# Patient Record
Sex: Female | Born: 1945
Health system: Southern US, Community
[De-identification: ages and names within clinical notes are randomized; demographics above are authoritative.]

## PROBLEM LIST (undated history)

## (undated) DIAGNOSIS — Z9889 Other specified postprocedural states: Secondary | ICD-10-CM

## (undated) DIAGNOSIS — Z8719 Personal history of other diseases of the digestive system: Secondary | ICD-10-CM

## (undated) DIAGNOSIS — E119 Type 2 diabetes mellitus without complications: Secondary | ICD-10-CM

## (undated) DIAGNOSIS — G250 Essential tremor: Secondary | ICD-10-CM

## (undated) DIAGNOSIS — I10 Essential (primary) hypertension: Secondary | ICD-10-CM

## (undated) DIAGNOSIS — F329 Major depressive disorder, single episode, unspecified: Secondary | ICD-10-CM

## (undated) DIAGNOSIS — M159 Polyosteoarthritis, unspecified: Secondary | ICD-10-CM

## (undated) DIAGNOSIS — F32A Depression, unspecified: Secondary | ICD-10-CM

## (undated) DIAGNOSIS — K760 Fatty (change of) liver, not elsewhere classified: Secondary | ICD-10-CM

## (undated) DIAGNOSIS — C439 Malignant melanoma of skin, unspecified: Secondary | ICD-10-CM

## (undated) DIAGNOSIS — I7300 Raynaud's syndrome without gangrene: Secondary | ICD-10-CM

## (undated) DIAGNOSIS — R112 Nausea with vomiting, unspecified: Secondary | ICD-10-CM

## (undated) DIAGNOSIS — J45909 Unspecified asthma, uncomplicated: Secondary | ICD-10-CM

## (undated) DIAGNOSIS — F410 Panic disorder [episodic paroxysmal anxiety] without agoraphobia: Secondary | ICD-10-CM

## (undated) DIAGNOSIS — Q8901 Asplenia (congenital): Secondary | ICD-10-CM

## (undated) DIAGNOSIS — G4733 Obstructive sleep apnea (adult) (pediatric): Secondary | ICD-10-CM

## (undated) DIAGNOSIS — J449 Chronic obstructive pulmonary disease, unspecified: Secondary | ICD-10-CM

## (undated) DIAGNOSIS — R0600 Dyspnea, unspecified: Secondary | ICD-10-CM

## (undated) DIAGNOSIS — Z972 Presence of dental prosthetic device (complete) (partial): Secondary | ICD-10-CM

## (undated) DIAGNOSIS — R52 Pain, unspecified: Secondary | ICD-10-CM

## (undated) DIAGNOSIS — J9611 Chronic respiratory failure with hypoxia: Secondary | ICD-10-CM

## (undated) DIAGNOSIS — G2581 Restless legs syndrome: Secondary | ICD-10-CM

## (undated) DIAGNOSIS — E039 Hypothyroidism, unspecified: Secondary | ICD-10-CM

## (undated) DIAGNOSIS — I499 Cardiac arrhythmia, unspecified: Secondary | ICD-10-CM

## (undated) DIAGNOSIS — M797 Fibromyalgia: Secondary | ICD-10-CM

## (undated) HISTORY — DX: Obstructive sleep apnea (adult) (pediatric): G47.33

## (undated) HISTORY — PX: CHOLECYSTECTOMY: SHX55

## (undated) HISTORY — DX: Essential tremor: G25.0

## (undated) HISTORY — DX: Malignant melanoma of skin, unspecified: C43.9

## (undated) HISTORY — DX: Polyosteoarthritis, unspecified: M15.9

## (undated) HISTORY — DX: Restless legs syndrome: G25.81

## (undated) HISTORY — PX: TOTAL KNEE ARTHROPLASTY: SHX125

## (undated) HISTORY — PX: JOINT REPLACEMENT: SHX530

## (undated) HISTORY — DX: Raynaud's syndrome without gangrene: I73.00

## (undated) HISTORY — DX: Essential (primary) hypertension: I10

---

## 1970-12-22 HISTORY — PX: RHINOPLASTY: SUR1284

## 1998-09-28 ENCOUNTER — Other Ambulatory Visit: Admission: RE | Admit: 1998-09-28 | Discharge: 1998-09-28 | Payer: Self-pay | Admitting: Gynecology

## 1999-12-23 DIAGNOSIS — E1140 Type 2 diabetes mellitus with diabetic neuropathy, unspecified: Secondary | ICD-10-CM | POA: Diagnosis present

## 2000-04-13 ENCOUNTER — Other Ambulatory Visit: Admission: RE | Admit: 2000-04-13 | Discharge: 2000-04-13 | Payer: Self-pay | Admitting: Gynecology

## 2000-05-11 ENCOUNTER — Encounter: Payer: Self-pay | Admitting: *Deleted

## 2000-05-11 ENCOUNTER — Encounter: Admission: RE | Admit: 2000-05-11 | Discharge: 2000-05-11 | Payer: Self-pay | Admitting: *Deleted

## 2000-10-16 ENCOUNTER — Encounter: Payer: Self-pay | Admitting: Internal Medicine

## 2000-10-16 ENCOUNTER — Encounter: Admission: RE | Admit: 2000-10-16 | Discharge: 2000-10-16 | Payer: Self-pay | Admitting: Internal Medicine

## 2001-02-01 ENCOUNTER — Other Ambulatory Visit: Admission: RE | Admit: 2001-02-01 | Discharge: 2001-02-01 | Payer: Self-pay | Admitting: Gynecology

## 2001-03-11 ENCOUNTER — Encounter (INDEPENDENT_AMBULATORY_CARE_PROVIDER_SITE_OTHER): Payer: Self-pay | Admitting: *Deleted

## 2001-03-11 ENCOUNTER — Other Ambulatory Visit: Admission: RE | Admit: 2001-03-11 | Discharge: 2001-03-11 | Payer: Self-pay | Admitting: Gynecology

## 2001-03-22 ENCOUNTER — Ambulatory Visit (HOSPITAL_COMMUNITY): Admission: RE | Admit: 2001-03-22 | Discharge: 2001-03-22 | Payer: Self-pay | Admitting: Gastroenterology

## 2001-06-28 ENCOUNTER — Ambulatory Visit (HOSPITAL_BASED_OUTPATIENT_CLINIC_OR_DEPARTMENT_OTHER): Admission: RE | Admit: 2001-06-28 | Discharge: 2001-06-28 | Payer: Self-pay | Admitting: Internal Medicine

## 2002-03-08 ENCOUNTER — Encounter: Admission: RE | Admit: 2002-03-08 | Discharge: 2002-03-08 | Payer: Self-pay | Admitting: Internal Medicine

## 2002-03-08 ENCOUNTER — Encounter: Payer: Self-pay | Admitting: Internal Medicine

## 2002-08-04 ENCOUNTER — Encounter: Payer: Self-pay | Admitting: Internal Medicine

## 2002-08-04 ENCOUNTER — Encounter: Admission: RE | Admit: 2002-08-04 | Discharge: 2002-08-04 | Payer: Self-pay | Admitting: Internal Medicine

## 2002-12-28 ENCOUNTER — Encounter: Admission: RE | Admit: 2002-12-28 | Discharge: 2002-12-28 | Payer: Self-pay | Admitting: Internal Medicine

## 2002-12-28 ENCOUNTER — Encounter: Payer: Self-pay | Admitting: Internal Medicine

## 2003-08-02 ENCOUNTER — Encounter: Payer: Self-pay | Admitting: Internal Medicine

## 2003-08-02 ENCOUNTER — Encounter: Admission: RE | Admit: 2003-08-02 | Discharge: 2003-08-02 | Payer: Self-pay | Admitting: Internal Medicine

## 2004-12-22 HISTORY — PX: THYROIDECTOMY: SHX17

## 2005-01-07 ENCOUNTER — Ambulatory Visit: Payer: Self-pay | Admitting: Internal Medicine

## 2005-02-13 ENCOUNTER — Ambulatory Visit: Payer: Self-pay | Admitting: Internal Medicine

## 2005-05-12 ENCOUNTER — Ambulatory Visit: Payer: Self-pay | Admitting: Endocrinology

## 2006-05-13 ENCOUNTER — Ambulatory Visit: Payer: Self-pay | Admitting: Internal Medicine

## 2006-05-20 ENCOUNTER — Ambulatory Visit: Payer: Self-pay | Admitting: Internal Medicine

## 2007-01-15 ENCOUNTER — Ambulatory Visit: Admission: RE | Admit: 2007-01-15 | Discharge: 2007-01-15 | Payer: Self-pay | Admitting: Orthopedic Surgery

## 2007-04-05 ENCOUNTER — Inpatient Hospital Stay (HOSPITAL_COMMUNITY): Admission: RE | Admit: 2007-04-05 | Discharge: 2007-04-09 | Payer: Self-pay | Admitting: Orthopedic Surgery

## 2007-06-22 ENCOUNTER — Ambulatory Visit: Payer: Self-pay | Admitting: Internal Medicine

## 2008-07-20 ENCOUNTER — Ambulatory Visit: Payer: Self-pay | Admitting: Internal Medicine

## 2009-07-24 ENCOUNTER — Ambulatory Visit: Payer: Self-pay | Admitting: Internal Medicine

## 2010-08-14 ENCOUNTER — Ambulatory Visit: Payer: Self-pay | Admitting: Internal Medicine

## 2010-10-14 ENCOUNTER — Ambulatory Visit: Payer: Self-pay | Admitting: Unknown Physician Specialty

## 2010-11-06 ENCOUNTER — Inpatient Hospital Stay (HOSPITAL_COMMUNITY)
Admission: RE | Admit: 2010-11-06 | Discharge: 2010-11-10 | Payer: Self-pay | Source: Home / Self Care | Admitting: Orthopedic Surgery

## 2010-11-07 ENCOUNTER — Encounter (INDEPENDENT_AMBULATORY_CARE_PROVIDER_SITE_OTHER): Payer: Self-pay | Admitting: Orthopedic Surgery

## 2010-11-07 ENCOUNTER — Ambulatory Visit: Payer: Self-pay | Admitting: Vascular Surgery

## 2011-03-04 LAB — PROTIME-INR
INR: 0.93 (ref 0.00–1.49)
INR: 1.05 (ref 0.00–1.49)
INR: 1.76 — ABNORMAL HIGH (ref 0.00–1.49)
INR: 1.95 — ABNORMAL HIGH (ref 0.00–1.49)
INR: 2.63 — ABNORMAL HIGH (ref 0.00–1.49)
Prothrombin Time: 12.7 seconds (ref 11.6–15.2)
Prothrombin Time: 13.9 seconds (ref 11.6–15.2)
Prothrombin Time: 20.7 seconds — ABNORMAL HIGH (ref 11.6–15.2)
Prothrombin Time: 22.4 seconds — ABNORMAL HIGH (ref 11.6–15.2)
Prothrombin Time: 28.2 seconds — ABNORMAL HIGH (ref 11.6–15.2)

## 2011-03-04 LAB — URINALYSIS, ROUTINE W REFLEX MICROSCOPIC
Bilirubin Urine: NEGATIVE
Bilirubin Urine: NEGATIVE
Glucose, UA: NEGATIVE mg/dL
Glucose, UA: NEGATIVE mg/dL
Hgb urine dipstick: NEGATIVE
Hgb urine dipstick: NEGATIVE
Ketones, ur: NEGATIVE mg/dL
Ketones, ur: NEGATIVE mg/dL
Nitrite: NEGATIVE
Nitrite: NEGATIVE
Protein, ur: NEGATIVE mg/dL
Protein, ur: NEGATIVE mg/dL
Specific Gravity, Urine: 1.006 (ref 1.005–1.030)
Specific Gravity, Urine: 1.014 (ref 1.005–1.030)
Urobilinogen, UA: 0.2 mg/dL (ref 0.0–1.0)
Urobilinogen, UA: 0.2 mg/dL (ref 0.0–1.0)
pH: 5.5 (ref 5.0–8.0)
pH: 6 (ref 5.0–8.0)

## 2011-03-04 LAB — DIFFERENTIAL
Basophils Absolute: 0.1 10*3/uL (ref 0.0–0.1)
Basophils Relative: 0 % (ref 0–1)
Eosinophils Absolute: 0 10*3/uL (ref 0.0–0.7)
Eosinophils Relative: 0 % (ref 0–5)
Lymphocytes Relative: 12 % (ref 12–46)
Lymphs Abs: 2.6 10*3/uL (ref 0.7–4.0)
Monocytes Absolute: 3.1 10*3/uL — ABNORMAL HIGH (ref 0.1–1.0)
Monocytes Relative: 14 % — ABNORMAL HIGH (ref 3–12)
Neutro Abs: 15.7 10*3/uL — ABNORMAL HIGH (ref 1.7–7.7)
Neutrophils Relative %: 73 % (ref 43–77)

## 2011-03-04 LAB — BASIC METABOLIC PANEL
BUN: 4 mg/dL — ABNORMAL LOW (ref 6–23)
BUN: 4 mg/dL — ABNORMAL LOW (ref 6–23)
BUN: 7 mg/dL (ref 6–23)
CO2: 24 mEq/L (ref 19–32)
CO2: 25 mEq/L (ref 19–32)
CO2: 26 mEq/L (ref 19–32)
Calcium: 8 mg/dL — ABNORMAL LOW (ref 8.4–10.5)
Calcium: 8.1 mg/dL — ABNORMAL LOW (ref 8.4–10.5)
Calcium: 8.7 mg/dL (ref 8.4–10.5)
Chloride: 106 mEq/L (ref 96–112)
Chloride: 106 mEq/L (ref 96–112)
Chloride: 106 mEq/L (ref 96–112)
Creatinine, Ser: 0.69 mg/dL (ref 0.4–1.2)
Creatinine, Ser: 0.72 mg/dL (ref 0.4–1.2)
Creatinine, Ser: 0.76 mg/dL (ref 0.4–1.2)
GFR calc Af Amer: >60 mL/min (ref >60)
GFR calc Af Amer: >60 mL/min (ref >60)
GFR calc Af Amer: >60 mL/min (ref >60)
GFR calc non Af Amer: >60 mL/min (ref >60)
GFR calc non Af Amer: >60 mL/min (ref >60)
GFR calc non Af Amer: >60 mL/min (ref >60)
Glucose, Bld: 112 mg/dL — ABNORMAL HIGH (ref 70–99)
Glucose, Bld: 118 mg/dL — ABNORMAL HIGH (ref 70–99)
Glucose, Bld: 129 mg/dL — ABNORMAL HIGH (ref 70–99)
Potassium: 3.4 mEq/L — ABNORMAL LOW (ref 3.5–5.1)
Potassium: 3.6 mEq/L (ref 3.5–5.1)
Potassium: 3.7 mEq/L (ref 3.5–5.1)
Sodium: 137 mEq/L (ref 135–145)
Sodium: 137 mEq/L (ref 135–145)
Sodium: 140 mEq/L (ref 135–145)

## 2011-03-04 LAB — CULTURE, BLOOD (ROUTINE X 2)
Culture  Setup Time: 201111190007
Culture  Setup Time: 201111190007
Culture: NO GROWTH
Culture: NO GROWTH

## 2011-03-04 LAB — COMPREHENSIVE METABOLIC PANEL
ALT: 32 U/L (ref 0–35)
AST: 33 U/L (ref 0–37)
Albumin: 4 g/dL (ref 3.5–5.2)
Alkaline Phosphatase: 68 U/L (ref 39–117)
BUN: 10 mg/dL (ref 6–23)
CO2: 26 mEq/L (ref 19–32)
Calcium: 9.4 mg/dL (ref 8.4–10.5)
Chloride: 105 mEq/L (ref 96–112)
Creatinine, Ser: 0.7 mg/dL (ref 0.4–1.2)
GFR calc Af Amer: >60 mL/min (ref >60)
GFR calc non Af Amer: >60 mL/min (ref >60)
Glucose, Bld: 89 mg/dL (ref 70–99)
Potassium: 4.1 mEq/L (ref 3.5–5.1)
Sodium: 139 mEq/L (ref 135–145)
Total Bilirubin: 0.5 mg/dL (ref 0.3–1.2)
Total Protein: 7.1 g/dL (ref 6.0–8.3)

## 2011-03-04 LAB — CBC
HCT: 27.9 % — ABNORMAL LOW (ref 36.0–46.0)
HCT: 29.7 % — ABNORMAL LOW (ref 36.0–46.0)
HCT: 31.6 % — ABNORMAL LOW (ref 36.0–46.0)
HCT: 32.7 % — ABNORMAL LOW (ref 36.0–46.0)
HCT: 45.3 % (ref 36.0–46.0)
Hemoglobin: 10 g/dL — ABNORMAL LOW (ref 12.0–15.0)
Hemoglobin: 10 g/dL — ABNORMAL LOW (ref 12.0–15.0)
Hemoglobin: 11 g/dL — ABNORMAL LOW (ref 12.0–15.0)
Hemoglobin: 15.5 g/dL — ABNORMAL HIGH (ref 12.0–15.0)
Hemoglobin: 9.3 g/dL — ABNORMAL LOW (ref 12.0–15.0)
MCH: 28.9 pg (ref 26.0–34.0)
MCH: 30.1 pg (ref 26.0–34.0)
MCH: 30.4 pg (ref 26.0–34.0)
MCH: 30.8 pg (ref 26.0–34.0)
MCH: 31 pg (ref 26.0–34.0)
MCHC: 31.6 g/dL (ref 30.0–36.0)
MCHC: 33.3 g/dL (ref 30.0–36.0)
MCHC: 33.6 g/dL (ref 30.0–36.0)
MCHC: 33.7 g/dL (ref 30.0–36.0)
MCHC: 34.2 g/dL (ref 30.0–36.0)
MCV: 90.3 fL (ref 78.0–100.0)
MCV: 90.3 fL (ref 78.0–100.0)
MCV: 90.6 fL (ref 78.0–100.0)
MCV: 91.3 fL (ref 78.0–100.0)
MCV: 91.4 fL (ref 78.0–100.0)
Platelets: 152 10*3/uL (ref 150–400)
Platelets: 160 10*3/uL (ref 150–400)
Platelets: 204 10*3/uL (ref 150–400)
Platelets: 216 10*3/uL (ref 150–400)
Platelets: 286 10*3/uL (ref 150–400)
RBC: 3.09 MIL/uL — ABNORMAL LOW (ref 3.87–5.11)
RBC: 3.25 MIL/uL — ABNORMAL LOW (ref 3.87–5.11)
RBC: 3.46 MIL/uL — ABNORMAL LOW (ref 3.87–5.11)
RBC: 3.62 MIL/uL — ABNORMAL LOW (ref 3.87–5.11)
RBC: 5 MIL/uL (ref 3.87–5.11)
RDW: 14.7 % (ref 11.5–15.5)
RDW: 14.8 % (ref 11.5–15.5)
RDW: 14.8 % (ref 11.5–15.5)
RDW: 14.9 % (ref 11.5–15.5)
RDW: 15 % (ref 11.5–15.5)
WBC: 13.4 10*3/uL — ABNORMAL HIGH (ref 4.0–10.5)
WBC: 13.8 10*3/uL — ABNORMAL HIGH (ref 4.0–10.5)
WBC: 16.1 10*3/uL — ABNORMAL HIGH (ref 4.0–10.5)
WBC: 21.4 10*3/uL — ABNORMAL HIGH (ref 4.0–10.5)
WBC: 9.4 10*3/uL (ref 4.0–10.5)

## 2011-03-04 LAB — SURGICAL PCR SCREEN
MRSA, PCR: NEGATIVE
Staphylococcus aureus: NEGATIVE

## 2011-03-04 LAB — URINE CULTURE: Culture  Setup Time: 201111181310

## 2011-03-04 LAB — APTT: aPTT: 37 seconds (ref 24–37)

## 2011-03-04 LAB — TYPE AND SCREEN
ABO/RH(D): A POS
Antibody Screen: NEGATIVE

## 2011-05-09 NOTE — Op Note (Signed)
NAMEDEVANI, ODONNEL                 ACCOUNT NO.:  [id number]   MEDICAL RECORD NO.:  [id number]          PATIENT TYPE:  INP   LOCATION:  5621                         FACILITY:  [facility]   PHYSICIAN:  Loreta Ave, M.D. DATE OF BIRTH:  August 07, 1946   DATE OF PROCEDURE:  04/05/2007  DATE OF DISCHARGE:                               OPERATIVE REPORT   PREOPERATIVE DIAGNOSIS:  End stage degenerative arthritis, right knee.   POSTOPERATIVE DIAGNOSIS:  End stage degenerative arthritis, right knee.   OPERATIVE PROCEDURE:  Right total knee replacement utilizing [employer] oxonium prosthesis.  A Press-Fit posterior stabilized cemented #5  femoral component.  Cemented #4 tibial component with 9 mm polyethylene  insert posterior stabilized.  Resurfacing cemented 35 mm patellar  component.  Soft tissue balancing with medial capsule release.   SURGEON:  Loreta Ave, M.D.   ASSISTANT:  Genene Churn. Barry Dienes, P.A.-C., present throughout the entire case.  Margarita Rana, fourth year medical student   ANESTHESIA:  General.   BLOOD LOSS:  Minimal.   TOURNIQUET TIME:  1 hour and 20 minutes.   SPECIMENS:  None.   CULTURES:  None.   COMPLICATIONS:  None.   DRESSING:  Soft compressive with knee immobilizer.   DRAINS:  Hemovac x1.   PROCEDURE:  The patient was brought to the operating room and placed on  the operating table in the spine position.  After adequate anesthesia  had been obtained, the right knee was examined.  Minimal flexion  contracture. Varus alignment correctable to neutral.  Flexion better  than 100 degrees.  Tourniquet applied.  Prepped and draped in the usual  sterile fashion.  Exsanguinated with elevation and Esmarch and  tourniquet inflated to 350 mmHg.  Straight incision above the patella  down to tibial tubercle.  The skin and subcutaneous tissue divided.  Hemostasis with cautery.  Medial arthrotomy.  Knee exposed.  Grade 4  change throughout.  Remnants of menisci,  cruciate ligaments, loose body,  and periarticular spurs removed.  Distal femur exposed.  Intramedullary  guide placed.  Distal cut removing 10 mm set at 5 degrees of valgus.  Epicondylar axis marked.  The size of the femur measured.  With  appropriate jigs, the femur was prepared for a number 5 component,  posterior stabilized.  Trial put in place and found to fit well.  Attention turned to the tibia.  Extramedullary guide.  3 degrees  posterior slope cut.  Sized for #4 component after the resection was  made for a 9 mm insert.  All recess examined and all spurs removed.  Trials put in place on the femur and on the tibia.  With a 9 mm insert,  full extension, full flexion, nicely balanced knee.  Tibia was marked  for appropriate rotation and hand reamed.  Patella was prepared by  removing the posterior 9 mm, sized and drilled for a 35 mm component.  With the trial in place and the other trials in place, I had excellent  patellofemoral contact, tracking without instability or mal-tracking.  All trials removed.  Copiously irrigated.  Cement prepared and placed on  all components which were firmly seated.  Excess cement removed.  Once  the cement hardened, the knee was re-examined.  Full extension, full  flexion, excellent patellofemoral tracking.  Good stability.  Wound  irrigated.  Hemovac placed and brought out through a separate stab  wound.  Arthrotomy closed with #1 Vicryl, skin and subcutaneous tissue  with Vicryl and staples.  Hemovac clamped after the knee was injected  with Marcaine.  Sterile compressive dressing applied.  Tourniquet  deflated and removed.  Knee immobilizer applied.  Anesthesia reversed.  Brought to the recovery room.  Tolerated the surgery well.  No  complications.      Loreta Ave, M.D.  Electronically Signed     DFM/MEDQ  D:  04/06/2007  T:  04/06/2007  Job:  [id number]

## 2011-06-16 ENCOUNTER — Encounter (HOSPITAL_COMMUNITY): Payer: Self-pay

## 2011-06-17 ENCOUNTER — Ambulatory Visit (HOSPITAL_COMMUNITY)
Admission: RE | Admit: 2011-06-17 | Discharge: 2011-06-17 | Disposition: A | Discharge status: Home / Self Care | Payer: PRIVATE HEALTH INSURANCE | Source: Ambulatory Visit | Attending: Rheumatology | Admitting: Rheumatology

## 2011-06-17 DIAGNOSIS — R0602 Shortness of breath: Secondary | ICD-10-CM | POA: Insufficient documentation

## 2011-07-23 ENCOUNTER — Encounter: Payer: Self-pay | Admitting: Emergency Medicine

## 2011-07-24 ENCOUNTER — Institutional Professional Consult (permissible substitution): Payer: PRIVATE HEALTH INSURANCE | Admitting: Emergency Medicine

## 2011-10-20 ENCOUNTER — Ambulatory Visit: Payer: Self-pay | Admitting: Internal Medicine

## 2012-01-13 ENCOUNTER — Ambulatory Visit: Payer: Self-pay | Admitting: Unknown Physician Specialty

## 2012-02-04 ENCOUNTER — Ambulatory Visit: Payer: Self-pay | Admitting: Internal Medicine

## 2012-08-04 ENCOUNTER — Ambulatory Visit: Payer: Self-pay | Admitting: Cardiology

## 2013-01-12 ENCOUNTER — Ambulatory Visit: Payer: Self-pay | Admitting: Internal Medicine

## 2013-01-13 ENCOUNTER — Ambulatory Visit: Payer: Self-pay | Admitting: Internal Medicine

## 2013-09-05 ENCOUNTER — Other Ambulatory Visit: Payer: Self-pay | Admitting: Neurosurgery

## 2013-09-05 DIAGNOSIS — M48062 Spinal stenosis, lumbar region with neurogenic claudication: Secondary | ICD-10-CM

## 2013-09-11 ENCOUNTER — Ambulatory Visit
Admission: RE | Admit: 2013-09-11 | Discharge: 2013-09-11 | Disposition: A | Discharge status: Home / Self Care | Payer: Medicare Other | Source: Ambulatory Visit | Attending: Neurosurgery | Admitting: Neurosurgery

## 2013-09-11 DIAGNOSIS — M48062 Spinal stenosis, lumbar region with neurogenic claudication: Secondary | ICD-10-CM

## 2013-09-15 ENCOUNTER — Other Ambulatory Visit: Payer: Self-pay | Admitting: Neurosurgery

## 2013-09-15 DIAGNOSIS — M48062 Spinal stenosis, lumbar region with neurogenic claudication: Secondary | ICD-10-CM

## 2013-09-15 DIAGNOSIS — G9519 Other vascular myelopathies: Secondary | ICD-10-CM

## 2014-01-31 ENCOUNTER — Ambulatory Visit: Payer: Self-pay | Admitting: Internal Medicine

## 2014-08-17 ENCOUNTER — Encounter: Payer: Self-pay | Admitting: *Deleted

## 2015-01-04 DIAGNOSIS — Z79899 Other long term (current) drug therapy: Secondary | ICD-10-CM | POA: Diagnosis not present

## 2015-01-05 DIAGNOSIS — R5383 Other fatigue: Secondary | ICD-10-CM | POA: Diagnosis not present

## 2015-01-05 DIAGNOSIS — R1013 Epigastric pain: Secondary | ICD-10-CM | POA: Diagnosis not present

## 2015-01-05 DIAGNOSIS — Z79899 Other long term (current) drug therapy: Secondary | ICD-10-CM | POA: Diagnosis not present

## 2015-01-05 DIAGNOSIS — G250 Essential tremor: Secondary | ICD-10-CM | POA: Diagnosis not present

## 2015-01-05 DIAGNOSIS — E039 Hypothyroidism, unspecified: Secondary | ICD-10-CM | POA: Diagnosis not present

## 2015-01-25 DIAGNOSIS — R29810 Facial weakness: Secondary | ICD-10-CM | POA: Diagnosis not present

## 2015-01-25 DIAGNOSIS — G252 Other specified forms of tremor: Secondary | ICD-10-CM | POA: Diagnosis not present

## 2015-02-06 ENCOUNTER — Other Ambulatory Visit: Payer: Self-pay | Admitting: Neurology

## 2015-02-06 DIAGNOSIS — R29810 Facial weakness: Secondary | ICD-10-CM

## 2015-02-20 ENCOUNTER — Ambulatory Visit
Admission: RE | Admit: 2015-02-20 | Discharge: 2015-02-20 | Disposition: A | Discharge status: Home / Self Care | Payer: Medicare Other | Source: Ambulatory Visit | Attending: Neurology | Admitting: Neurology

## 2015-02-20 DIAGNOSIS — R29810 Facial weakness: Secondary | ICD-10-CM

## 2015-02-22 DIAGNOSIS — K760 Fatty (change of) liver, not elsewhere classified: Secondary | ICD-10-CM | POA: Diagnosis not present

## 2015-02-22 DIAGNOSIS — R1011 Right upper quadrant pain: Secondary | ICD-10-CM | POA: Diagnosis not present

## 2015-02-22 DIAGNOSIS — I517 Cardiomegaly: Secondary | ICD-10-CM | POA: Diagnosis not present

## 2015-02-22 DIAGNOSIS — R0609 Other forms of dyspnea: Secondary | ICD-10-CM | POA: Diagnosis not present

## 2015-02-22 DIAGNOSIS — R0600 Dyspnea, unspecified: Secondary | ICD-10-CM | POA: Diagnosis not present

## 2015-02-22 DIAGNOSIS — Z8371 Family history of colonic polyps: Secondary | ICD-10-CM | POA: Diagnosis not present

## 2015-02-22 DIAGNOSIS — G8929 Other chronic pain: Secondary | ICD-10-CM | POA: Diagnosis not present

## 2015-02-27 ENCOUNTER — Ambulatory Visit: Payer: Self-pay | Admitting: Unknown Physician Specialty

## 2015-02-27 DIAGNOSIS — R1011 Right upper quadrant pain: Secondary | ICD-10-CM | POA: Diagnosis not present

## 2015-02-27 DIAGNOSIS — K760 Fatty (change of) liver, not elsewhere classified: Secondary | ICD-10-CM | POA: Diagnosis not present

## 2015-02-27 DIAGNOSIS — Z9049 Acquired absence of other specified parts of digestive tract: Secondary | ICD-10-CM | POA: Diagnosis not present

## 2015-03-02 DIAGNOSIS — G250 Essential tremor: Secondary | ICD-10-CM | POA: Diagnosis not present

## 2015-03-02 DIAGNOSIS — G2581 Restless legs syndrome: Secondary | ICD-10-CM | POA: Diagnosis not present

## 2015-03-02 DIAGNOSIS — I517 Cardiomegaly: Secondary | ICD-10-CM | POA: Diagnosis not present

## 2015-03-02 DIAGNOSIS — I10 Essential (primary) hypertension: Secondary | ICD-10-CM | POA: Diagnosis not present

## 2015-03-05 DIAGNOSIS — H359 Unspecified retinal disorder: Secondary | ICD-10-CM | POA: Diagnosis not present

## 2015-03-07 DIAGNOSIS — H3532 Exudative age-related macular degeneration: Secondary | ICD-10-CM | POA: Diagnosis not present

## 2015-03-09 DIAGNOSIS — F419 Anxiety disorder, unspecified: Secondary | ICD-10-CM | POA: Diagnosis not present

## 2015-03-09 DIAGNOSIS — R002 Palpitations: Secondary | ICD-10-CM | POA: Diagnosis not present

## 2015-03-09 DIAGNOSIS — I10 Essential (primary) hypertension: Secondary | ICD-10-CM | POA: Diagnosis not present

## 2015-03-09 DIAGNOSIS — G4733 Obstructive sleep apnea (adult) (pediatric): Secondary | ICD-10-CM | POA: Diagnosis not present

## 2015-03-14 DIAGNOSIS — R002 Palpitations: Secondary | ICD-10-CM | POA: Diagnosis not present

## 2015-03-19 DIAGNOSIS — J449 Chronic obstructive pulmonary disease, unspecified: Secondary | ICD-10-CM | POA: Diagnosis not present

## 2015-03-19 DIAGNOSIS — M3489 Other systemic sclerosis: Secondary | ICD-10-CM | POA: Diagnosis not present

## 2015-03-19 DIAGNOSIS — I7300 Raynaud's syndrome without gangrene: Secondary | ICD-10-CM | POA: Diagnosis not present

## 2015-03-19 DIAGNOSIS — M5032 Other cervical disc degeneration, mid-cervical region: Secondary | ICD-10-CM | POA: Diagnosis not present

## 2015-03-19 DIAGNOSIS — M25511 Pain in right shoulder: Secondary | ICD-10-CM | POA: Diagnosis not present

## 2015-03-19 DIAGNOSIS — M150 Primary generalized (osteo)arthritis: Secondary | ICD-10-CM | POA: Diagnosis not present

## 2015-03-19 DIAGNOSIS — M19011 Primary osteoarthritis, right shoulder: Secondary | ICD-10-CM | POA: Diagnosis not present

## 2015-03-23 DIAGNOSIS — I10 Essential (primary) hypertension: Secondary | ICD-10-CM | POA: Diagnosis not present

## 2015-04-02 ENCOUNTER — Ambulatory Visit
Admit: 2015-04-02 | Disposition: A | Discharge status: Home / Self Care | Payer: Self-pay | Attending: Unknown Physician Specialty | Admitting: Unknown Physician Specialty

## 2015-04-02 DIAGNOSIS — K219 Gastro-esophageal reflux disease without esophagitis: Secondary | ICD-10-CM | POA: Diagnosis not present

## 2015-04-02 DIAGNOSIS — R1011 Right upper quadrant pain: Secondary | ICD-10-CM | POA: Diagnosis not present

## 2015-04-02 DIAGNOSIS — Z888 Allergy status to other drugs, medicaments and biological substances status: Secondary | ICD-10-CM | POA: Diagnosis not present

## 2015-04-02 DIAGNOSIS — Z79899 Other long term (current) drug therapy: Secondary | ICD-10-CM | POA: Diagnosis not present

## 2015-04-02 DIAGNOSIS — I7300 Raynaud's syndrome without gangrene: Secondary | ICD-10-CM | POA: Diagnosis not present

## 2015-04-02 DIAGNOSIS — K2980 Duodenitis without bleeding: Secondary | ICD-10-CM | POA: Diagnosis not present

## 2015-04-02 DIAGNOSIS — E669 Obesity, unspecified: Secondary | ICD-10-CM | POA: Diagnosis not present

## 2015-04-02 DIAGNOSIS — J4540 Moderate persistent asthma, uncomplicated: Secondary | ICD-10-CM | POA: Diagnosis not present

## 2015-04-02 DIAGNOSIS — I10 Essential (primary) hypertension: Secondary | ICD-10-CM | POA: Diagnosis not present

## 2015-04-02 DIAGNOSIS — K640 First degree hemorrhoids: Secondary | ICD-10-CM | POA: Diagnosis not present

## 2015-04-02 DIAGNOSIS — Z7951 Long term (current) use of inhaled steroids: Secondary | ICD-10-CM | POA: Diagnosis not present

## 2015-04-02 DIAGNOSIS — K2970 Gastritis, unspecified, without bleeding: Secondary | ICD-10-CM | POA: Diagnosis not present

## 2015-04-02 DIAGNOSIS — M797 Fibromyalgia: Secondary | ICD-10-CM | POA: Diagnosis not present

## 2015-04-02 DIAGNOSIS — Z86010 Personal history of colonic polyps: Secondary | ICD-10-CM | POA: Diagnosis not present

## 2015-04-02 DIAGNOSIS — K2960 Other gastritis without bleeding: Secondary | ICD-10-CM | POA: Diagnosis not present

## 2015-04-02 DIAGNOSIS — F329 Major depressive disorder, single episode, unspecified: Secondary | ICD-10-CM | POA: Diagnosis not present

## 2015-04-02 DIAGNOSIS — Z1211 Encounter for screening for malignant neoplasm of colon: Secondary | ICD-10-CM | POA: Diagnosis not present

## 2015-04-02 DIAGNOSIS — Z6841 Body Mass Index (BMI) 40.0 and over, adult: Secondary | ICD-10-CM | POA: Diagnosis not present

## 2015-04-02 DIAGNOSIS — F419 Anxiety disorder, unspecified: Secondary | ICD-10-CM | POA: Diagnosis not present

## 2015-04-02 DIAGNOSIS — K5730 Diverticulosis of large intestine without perforation or abscess without bleeding: Secondary | ICD-10-CM | POA: Diagnosis not present

## 2015-04-02 DIAGNOSIS — G4730 Sleep apnea, unspecified: Secondary | ICD-10-CM | POA: Diagnosis not present

## 2015-04-02 DIAGNOSIS — K5790 Diverticulosis of intestine, part unspecified, without perforation or abscess without bleeding: Secondary | ICD-10-CM | POA: Diagnosis not present

## 2015-04-02 LAB — HM COLONOSCOPY

## 2015-04-12 DIAGNOSIS — G2581 Restless legs syndrome: Secondary | ICD-10-CM | POA: Diagnosis not present

## 2015-04-12 DIAGNOSIS — M797 Fibromyalgia: Secondary | ICD-10-CM | POA: Diagnosis not present

## 2015-04-12 DIAGNOSIS — J4540 Moderate persistent asthma, uncomplicated: Secondary | ICD-10-CM | POA: Diagnosis not present

## 2015-04-12 DIAGNOSIS — I10 Essential (primary) hypertension: Secondary | ICD-10-CM | POA: Diagnosis not present

## 2015-04-16 LAB — SURGICAL PATHOLOGY

## 2015-04-26 DIAGNOSIS — G250 Essential tremor: Secondary | ICD-10-CM | POA: Diagnosis not present

## 2015-04-26 DIAGNOSIS — J310 Chronic rhinitis: Secondary | ICD-10-CM | POA: Diagnosis not present

## 2015-04-26 DIAGNOSIS — Z6841 Body Mass Index (BMI) 40.0 and over, adult: Secondary | ICD-10-CM | POA: Diagnosis not present

## 2015-04-26 DIAGNOSIS — J4540 Moderate persistent asthma, uncomplicated: Secondary | ICD-10-CM | POA: Diagnosis not present

## 2015-04-26 DIAGNOSIS — G4733 Obstructive sleep apnea (adult) (pediatric): Secondary | ICD-10-CM | POA: Diagnosis not present

## 2015-05-07 ENCOUNTER — Other Ambulatory Visit: Payer: Self-pay | Admitting: Rheumatology

## 2015-05-07 DIAGNOSIS — M150 Primary generalized (osteo)arthritis: Secondary | ICD-10-CM | POA: Diagnosis not present

## 2015-05-07 DIAGNOSIS — M3489 Other systemic sclerosis: Secondary | ICD-10-CM | POA: Diagnosis not present

## 2015-05-07 DIAGNOSIS — J449 Chronic obstructive pulmonary disease, unspecified: Secondary | ICD-10-CM | POA: Diagnosis not present

## 2015-05-07 DIAGNOSIS — I7300 Raynaud's syndrome without gangrene: Secondary | ICD-10-CM | POA: Diagnosis not present

## 2015-05-07 DIAGNOSIS — M25511 Pain in right shoulder: Secondary | ICD-10-CM

## 2015-05-23 ENCOUNTER — Other Ambulatory Visit: Payer: Medicare Other

## 2015-05-28 ENCOUNTER — Ambulatory Visit
Admission: RE | Admit: 2015-05-28 | Discharge: 2015-05-28 | Disposition: A | Discharge status: Home / Self Care | Payer: Medicare Other | Source: Ambulatory Visit | Attending: Rheumatology | Admitting: Rheumatology

## 2015-05-28 DIAGNOSIS — M7521 Bicipital tendinitis, right shoulder: Secondary | ICD-10-CM | POA: Diagnosis not present

## 2015-05-28 DIAGNOSIS — M75101 Unspecified rotator cuff tear or rupture of right shoulder, not specified as traumatic: Secondary | ICD-10-CM | POA: Diagnosis not present

## 2015-05-28 DIAGNOSIS — M25511 Pain in right shoulder: Secondary | ICD-10-CM

## 2015-05-28 DIAGNOSIS — M19011 Primary osteoarthritis, right shoulder: Secondary | ICD-10-CM | POA: Diagnosis not present

## 2015-06-06 DIAGNOSIS — M19011 Primary osteoarthritis, right shoulder: Secondary | ICD-10-CM | POA: Diagnosis not present

## 2015-06-27 DIAGNOSIS — I7300 Raynaud's syndrome without gangrene: Secondary | ICD-10-CM | POA: Diagnosis not present

## 2015-06-27 DIAGNOSIS — M3489 Other systemic sclerosis: Secondary | ICD-10-CM | POA: Diagnosis not present

## 2015-06-27 DIAGNOSIS — J449 Chronic obstructive pulmonary disease, unspecified: Secondary | ICD-10-CM | POA: Diagnosis not present

## 2015-06-27 DIAGNOSIS — M150 Primary generalized (osteo)arthritis: Secondary | ICD-10-CM | POA: Diagnosis not present

## 2015-09-07 DIAGNOSIS — H3531 Nonexudative age-related macular degeneration: Secondary | ICD-10-CM | POA: Diagnosis not present

## 2015-09-24 DIAGNOSIS — G4733 Obstructive sleep apnea (adult) (pediatric): Secondary | ICD-10-CM | POA: Diagnosis not present

## 2015-09-24 DIAGNOSIS — Z6841 Body Mass Index (BMI) 40.0 and over, adult: Secondary | ICD-10-CM | POA: Diagnosis not present

## 2015-09-24 DIAGNOSIS — I10 Essential (primary) hypertension: Secondary | ICD-10-CM | POA: Diagnosis not present

## 2015-09-24 DIAGNOSIS — G250 Essential tremor: Secondary | ICD-10-CM | POA: Diagnosis not present

## 2015-10-25 DIAGNOSIS — G4733 Obstructive sleep apnea (adult) (pediatric): Secondary | ICD-10-CM | POA: Diagnosis not present

## 2015-10-25 DIAGNOSIS — J209 Acute bronchitis, unspecified: Secondary | ICD-10-CM | POA: Diagnosis not present

## 2015-10-25 DIAGNOSIS — J4540 Moderate persistent asthma, uncomplicated: Secondary | ICD-10-CM | POA: Diagnosis not present

## 2015-10-30 DIAGNOSIS — M25511 Pain in right shoulder: Secondary | ICD-10-CM | POA: Diagnosis not present

## 2015-10-30 DIAGNOSIS — M150 Primary generalized (osteo)arthritis: Secondary | ICD-10-CM | POA: Diagnosis not present

## 2015-10-30 DIAGNOSIS — R768 Other specified abnormal immunological findings in serum: Secondary | ICD-10-CM | POA: Diagnosis not present

## 2015-10-30 DIAGNOSIS — M2550 Pain in unspecified joint: Secondary | ICD-10-CM | POA: Diagnosis not present

## 2015-10-30 DIAGNOSIS — J449 Chronic obstructive pulmonary disease, unspecified: Secondary | ICD-10-CM | POA: Diagnosis not present

## 2015-10-30 DIAGNOSIS — I7300 Raynaud's syndrome without gangrene: Secondary | ICD-10-CM | POA: Diagnosis not present

## 2015-11-26 DIAGNOSIS — R252 Cramp and spasm: Secondary | ICD-10-CM | POA: Diagnosis not present

## 2015-11-26 DIAGNOSIS — Z79899 Other long term (current) drug therapy: Secondary | ICD-10-CM | POA: Diagnosis not present

## 2015-11-26 DIAGNOSIS — I10 Essential (primary) hypertension: Secondary | ICD-10-CM | POA: Diagnosis not present

## 2015-11-26 DIAGNOSIS — E039 Hypothyroidism, unspecified: Secondary | ICD-10-CM | POA: Diagnosis not present

## 2016-01-17 DIAGNOSIS — J45901 Unspecified asthma with (acute) exacerbation: Secondary | ICD-10-CM | POA: Diagnosis not present

## 2016-01-17 DIAGNOSIS — R05 Cough: Secondary | ICD-10-CM | POA: Diagnosis not present

## 2016-01-17 DIAGNOSIS — Z6841 Body Mass Index (BMI) 40.0 and over, adult: Secondary | ICD-10-CM | POA: Diagnosis not present

## 2016-01-17 DIAGNOSIS — J441 Chronic obstructive pulmonary disease with (acute) exacerbation: Secondary | ICD-10-CM | POA: Diagnosis not present

## 2016-01-17 DIAGNOSIS — G4733 Obstructive sleep apnea (adult) (pediatric): Secondary | ICD-10-CM | POA: Diagnosis not present

## 2016-02-12 DIAGNOSIS — J45901 Unspecified asthma with (acute) exacerbation: Secondary | ICD-10-CM | POA: Diagnosis not present

## 2016-02-12 DIAGNOSIS — Z6841 Body Mass Index (BMI) 40.0 and over, adult: Secondary | ICD-10-CM | POA: Diagnosis not present

## 2016-02-12 DIAGNOSIS — R05 Cough: Secondary | ICD-10-CM | POA: Diagnosis not present

## 2016-02-12 DIAGNOSIS — G4733 Obstructive sleep apnea (adult) (pediatric): Secondary | ICD-10-CM | POA: Diagnosis not present

## 2016-02-12 DIAGNOSIS — M940 Chondrocostal junction syndrome [Tietze]: Secondary | ICD-10-CM | POA: Diagnosis not present

## 2016-02-12 DIAGNOSIS — J441 Chronic obstructive pulmonary disease with (acute) exacerbation: Secondary | ICD-10-CM | POA: Diagnosis not present

## 2016-03-18 ENCOUNTER — Ambulatory Visit: Payer: Medicare Other | Admitting: Family Medicine

## 2016-04-16 ENCOUNTER — Encounter: Payer: Self-pay | Admitting: Internal Medicine

## 2016-04-16 ENCOUNTER — Ambulatory Visit (INDEPENDENT_AMBULATORY_CARE_PROVIDER_SITE_OTHER): Payer: Medicare Other | Admitting: Internal Medicine

## 2016-04-16 VITALS — BP 128/72 | HR 74 | Temp 98.2°F | Ht 65.5 in | Wt 278.0 lb

## 2016-04-16 DIAGNOSIS — R5383 Other fatigue: Secondary | ICD-10-CM

## 2016-04-16 DIAGNOSIS — Z23 Encounter for immunization: Secondary | ICD-10-CM | POA: Diagnosis not present

## 2016-04-16 DIAGNOSIS — G4733 Obstructive sleep apnea (adult) (pediatric): Secondary | ICD-10-CM | POA: Diagnosis not present

## 2016-04-16 DIAGNOSIS — G250 Essential tremor: Secondary | ICD-10-CM

## 2016-04-16 DIAGNOSIS — G2581 Restless legs syndrome: Secondary | ICD-10-CM

## 2016-04-16 DIAGNOSIS — R531 Weakness: Secondary | ICD-10-CM | POA: Insufficient documentation

## 2016-04-16 DIAGNOSIS — J42 Unspecified chronic bronchitis: Secondary | ICD-10-CM | POA: Insufficient documentation

## 2016-04-16 DIAGNOSIS — I7300 Raynaud's syndrome without gangrene: Secondary | ICD-10-CM | POA: Insufficient documentation

## 2016-04-16 DIAGNOSIS — J449 Chronic obstructive pulmonary disease, unspecified: Secondary | ICD-10-CM | POA: Insufficient documentation

## 2016-04-16 DIAGNOSIS — I10 Essential (primary) hypertension: Secondary | ICD-10-CM | POA: Diagnosis not present

## 2016-04-16 DIAGNOSIS — M159 Polyosteoarthritis, unspecified: Secondary | ICD-10-CM

## 2016-04-16 DIAGNOSIS — J439 Emphysema, unspecified: Secondary | ICD-10-CM

## 2016-04-16 LAB — COMPREHENSIVE METABOLIC PANEL
ALT: 30 U/L (ref 0–35)
AST: 30 U/L (ref 0–37)
Albumin: 4.1 g/dL (ref 3.5–5.2)
Alkaline Phosphatase: 56 U/L (ref 39–117)
BUN: 14 mg/dL (ref 6–23)
CO2: 26 mEq/L (ref 19–32)
Calcium: 10.3 mg/dL (ref 8.4–10.5)
Chloride: 104 mEq/L (ref 96–112)
Creatinine, Ser: 0.78 mg/dL (ref 0.40–1.20)
GFR: 78.13 mL/min (ref >60.00)
Glucose, Bld: 96 mg/dL (ref 70–99)
Potassium: 3.8 mEq/L (ref 3.5–5.1)
Sodium: 140 mEq/L (ref 135–145)
Total Bilirubin: 0.6 mg/dL (ref 0.2–1.2)
Total Protein: 7.6 g/dL (ref 6.0–8.3)

## 2016-04-16 LAB — CBC WITH DIFFERENTIAL/PLATELET
Basophils Absolute: 0.1 10*3/uL (ref 0.0–0.1)
Basophils Relative: 0.5 % (ref 0.0–3.0)
Eosinophils Absolute: 0.3 10*3/uL (ref 0.0–0.7)
Eosinophils Relative: 3.1 % (ref 0.0–5.0)
HCT: 49 % — ABNORMAL HIGH (ref 36.0–46.0)
Hemoglobin: 16.4 g/dL — ABNORMAL HIGH (ref 12.0–15.0)
Lymphocytes Relative: 32.4 % (ref 12.0–46.0)
Lymphs Abs: 3.3 10*3/uL (ref 0.7–4.0)
MCHC: 33.3 g/dL (ref 30.0–36.0)
MCV: 92.4 fl (ref 78.0–100.0)
Monocytes Absolute: 1.3 10*3/uL — ABNORMAL HIGH (ref 0.1–1.0)
Monocytes Relative: 12.8 % — ABNORMAL HIGH (ref 3.0–12.0)
Neutro Abs: 5.2 10*3/uL (ref 1.4–7.7)
Neutrophils Relative %: 51.2 % (ref 43.0–77.0)
Platelets: 253 10*3/uL (ref 150.0–400.0)
RBC: 5.31 Mil/uL — ABNORMAL HIGH (ref 3.87–5.11)
RDW: 15.7 % — ABNORMAL HIGH (ref 11.5–15.5)
WBC: 10.2 10*3/uL (ref 4.0–10.5)

## 2016-04-16 LAB — SEDIMENTATION RATE: Sed Rate: 24 mm/hr — ABNORMAL HIGH (ref 0–22)

## 2016-04-16 LAB — T4, FREE: Free T4: 1.44 ng/dL (ref 0.60–1.60)

## 2016-04-16 LAB — TSH: TSH: 4.43 u[IU]/mL (ref 0.35–4.50)

## 2016-04-16 MED ORDER — TRAMADOL HCL 50 MG PO TABS: 25.0000 mg | ORAL_TABLET | Freq: Three times a day (TID) | ORAL | Status: DC | PRN

## 2016-04-16 NOTE — Assessment & Plan Note (Signed)
Probably multifactorial Pain, dyspnea, frustration [facility], will check labs, etc

## 2016-04-16 NOTE — Assessment & Plan Note (Signed)
Mostly in head and hands Not a big functional problem

## 2016-04-16 NOTE — Assessment & Plan Note (Addendum)
This is not clear cut Bad SOB---she states she had Cath that was benign a few years ago Gets help from albuterol Did have PFTs, etc at [facility]---but results not visible Needs to have reconsideration of other treatment options She has sample of dulera from Dr Humphrey Rolls--- I asked her to try that for now

## 2016-04-16 NOTE — Assessment & Plan Note (Signed)
Worst is shoulder but multiple other spots Suspect rotator cuff not a big part of shoulder issues Will add tramadol in low dose and see how she does

## 2016-04-16 NOTE — Assessment & Plan Note (Signed)
BP Readings from Last 3 Encounters:  04/16/16 128/72   Adequate control

## 2016-04-16 NOTE — Progress Notes (Signed)
Subjective:    Patient ID: Kari Medina, female    DOB: 19-Nov-1946, 70 y.o.   MRN: [id number]  HPI Here to establish care Was not satisfied with care at [facility]--- wanted more mature physician  Had MRI on right shoulder Small rotator cuff tear---getting injections  Still hard even holding a book Has to pick up right arm by end of day--gets weak Also with arthritic changes--- will "grind" Would use 1/2 tramadol and 2 tylenol--will help pain  Sees Dr Trudie Reed for rheumatology (positive ANA and Raynauds) Suspicious for scleroderma Does have chronic pain--??fibromyalgia Known osteoarthritis --- both TKR, chronic back pain "sometimes I feel like I am falling apart"  Had seen Dr Rogers Blocker in past Found right kidney cyst about 15 years ago No dysuria or hematuria  Known HTN for many years Feels it was related to her weight gain--- 140# until close to 95  Hashimoto's thyroiditis Had some "hot spots" so had total thyroidectomy Has been on synthroid since then (and even before the surgery)  Essential tremor Mom, brother and sister also with tremor Takes propranolol for this  Chronic sleep problems Obstructive sleep apnea--on CPAP Also RLS---takes ropinrole  Past asthma and smoker Stopped many years ago Saw Dr Raul Del--- mild COPD? Wheezes sometimes. Uses albuterol daily-- 2-3 times   Current Outpatient Prescriptions on File Prior to Visit  Medication Sig Dispense Refill  . acetaminophen (TYLENOL) 650 MG CR tablet Take 650 mg by mouth every 6 (six) hours as needed.      Marland Kitchen b complex vitamins tablet Take 1 tablet by mouth daily.      . calcium carbonate (OS-CAL) 600 MG TABS Take 600 mg by mouth daily with breakfast.     . Cholecalciferol (VITAMIN D) 2000 UNITS CAPS Take 4 capsules by mouth daily.     . fish oil-omega-3 fatty acids 1000 MG capsule Take 1 capsule by mouth daily.      Marland Kitchen levothyroxine (SYNTHROID, LEVOTHROID) 200 MCG tablet Take 200 mcg by mouth daily.      .  Multiple Vitamin (MULTIVITAMIN) capsule Take 1 capsule by mouth daily.      . traMADol (ULTRAM) 50 MG tablet Take 50 mg by mouth every 6 (six) hours as needed.       No current facility-administered medications on file prior to visit.    Allergies  Allergen Reactions  . Latex Hives  . Nickel Hives  . Prednisone     Past Medical History  Diagnosis Date  . Generalized osteoarthritis of multiple sites   . Hypertension   . Tremor, essential   . OSA (obstructive sleep apnea)   . RLS (restless legs syndrome)   . Raynaud disease     Past Surgical History  Procedure Laterality Date  . Total knee arthroplasty      bilateral  . Thyroidectomy  2006  . Cholecystectomy    . Rhinoplasty  1972    Family History  Problem Relation Age of Onset  . Osteoarthritis Mother   . Diabetes Mother   . Cirrhosis Mother   . Cancer Father   . Heart disease Brother     stents in 1 brother    Social History   Social History  . Marital Status: Married    Spouse Name: N/A  . Number of Children: 1  . Years of Education: N/A   Occupational History  . Neurosurgeon     Retired   Social History Main Topics  . Smoking status: Former  Smoker    Quit date: 12/22/1988  . Smokeless tobacco: Not on file  . Alcohol Use: Not on file  . Drug Use: Not on file  . Sexual Activity: Not on file   Other Topics Concern  . Not on file   Social History Narrative   1 daughter      Has living will   Husband has health care POA   Would allow resuscitation but no prolonged machines   Review of Systems  Constitutional: Positive for fatigue.       Has gained 20-30# in past year Chronic fatigue  HENT: Positive for dental problem. Negative for hearing loss and tinnitus.        Has lost many teeth Top/bottom partials  Eyes: Negative for visual disturbance.       No diplopia or unilateral vision loss ??early MD  Respiratory: Positive for shortness of breath. Negative for cough and chest  tightness.        [facility] but [facility] even coming in from the parking lot  Cardiovascular: Negative for chest pain, palpitations and leg swelling.  Gastrointestinal: Negative for constipation and blood in stool.       Gets "swelling feeling" and discomfort in RUQ. Had ultrasound done and EGD/colon by Dr Vira Agar (3-4 years ago)  Endocrine: Positive for polydipsia. Negative for polyuria.  Genitourinary: Negative for dysuria and hematuria.       Chronic urinary incontinence  Musculoskeletal: Positive for back pain and arthralgias. Negative for joint swelling.  Allergic/Immunologic: Positive for environmental allergies. Negative for immunocompromised state.  Neurological: Positive for dizziness. Negative for syncope, light-headedness and headaches.       May get dizzy if reaching down and then gets up. Relates to meloxicam (slight vertigo)  Hematological: Negative for adenopathy. Does not bruise/bleed easily.  Psychiatric/Behavioral: Positive for sleep disturbance. Negative for dysphoric mood. The patient is nervous/anxious.        Ropinirole does help her sleep (with tylenol)       Objective:   Physical Exam  Constitutional: She appears well-developed. No distress.  Neck: Normal range of motion. Neck supple. No thyromegaly present.  Cardiovascular: Normal rate, regular rhythm, normal heart sounds and intact distal pulses.  Exam reveals no gallop.   No murmur heard. Pulmonary/Chest: Effort normal and breath sounds normal. No respiratory distress. She has no wheezes. She has no rales.  Abdominal: Soft. There is no tenderness.  Musculoskeletal:  Thick calves but no pitting  Fair active abduction of right shoulder but very limited internal/external rotation  Lymphadenopathy:    She has no cervical adenopathy.  Skin: No rash noted.  Psychiatric:  Anxious about condition but not overtly depressed          Assessment & Plan:

## 2016-04-28 DIAGNOSIS — J449 Chronic obstructive pulmonary disease, unspecified: Secondary | ICD-10-CM | POA: Diagnosis not present

## 2016-04-28 DIAGNOSIS — M150 Primary generalized (osteo)arthritis: Secondary | ICD-10-CM | POA: Diagnosis not present

## 2016-04-28 DIAGNOSIS — I7300 Raynaud's syndrome without gangrene: Secondary | ICD-10-CM | POA: Diagnosis not present

## 2016-04-28 DIAGNOSIS — R768 Other specified abnormal immunological findings in serum: Secondary | ICD-10-CM | POA: Diagnosis not present

## 2016-04-28 DIAGNOSIS — J40 Bronchitis, not specified as acute or chronic: Secondary | ICD-10-CM | POA: Diagnosis not present

## 2016-04-28 DIAGNOSIS — M2550 Pain in unspecified joint: Secondary | ICD-10-CM | POA: Diagnosis not present

## 2016-05-22 ENCOUNTER — Ambulatory Visit (INDEPENDENT_AMBULATORY_CARE_PROVIDER_SITE_OTHER): Payer: Medicare Other | Admitting: Internal Medicine

## 2016-05-22 ENCOUNTER — Encounter: Payer: Self-pay | Admitting: Internal Medicine

## 2016-05-22 VITALS — BP 126/86 | HR 71 | Temp 97.7°F | Resp 18 | Wt 279.0 lb

## 2016-05-22 DIAGNOSIS — Z6841 Body Mass Index (BMI) 40.0 and over, adult: Secondary | ICD-10-CM | POA: Diagnosis not present

## 2016-05-22 DIAGNOSIS — J439 Emphysema, unspecified: Secondary | ICD-10-CM | POA: Diagnosis not present

## 2016-05-22 MED ORDER — MONTELUKAST SODIUM 10 MG PO TABS: 10.0000 mg | ORAL_TABLET | Freq: Every day | ORAL | Status: DC

## 2016-05-22 NOTE — Patient Instructions (Signed)
DASH Eating Plan  DASH stands for "Dietary Approaches to Stop Hypertension." The DASH eating plan is a healthy eating plan that has been shown to reduce high blood pressure (hypertension). Additional health benefits may include reducing the risk of type 2 diabetes mellitus, heart disease, and stroke. The DASH eating plan may also help with weight loss.  WHAT DO I NEED TO KNOW ABOUT THE DASH EATING PLAN?  For the DASH eating plan, you will follow these general guidelines:  · Choose foods with a percent daily value for sodium of less than 5% (as listed on the food label).  · Use salt-free seasonings or herbs instead of table salt or sea salt.  · Check with your health care provider or pharmacist before using salt substitutes.  · Eat lower-sodium products, often labeled as "lower sodium" or "no salt added."  · Eat fresh foods.  · Eat more vegetables, fruits, and low-fat dairy products.  · Choose whole grains. Look for the word "whole" as the first word in the ingredient list.  · Choose fish and skinless chicken or turkey more often than red meat. Limit fish, poultry, and meat to 6 oz (170 g) each day.  · Limit sweets, desserts, sugars, and sugary drinks.  · Choose heart-healthy fats.  · Limit cheese to 1 oz (28 g) per day.  · Eat more home-cooked food and less restaurant, buffet, and fast food.  · Limit fried foods.  · Cook foods using methods other than frying.  · Limit canned vegetables. If you do use them, rinse them well to decrease the sodium.  · When eating at a restaurant, ask that your food be prepared with less salt, or no salt if possible.  WHAT FOODS CAN I EAT?  Seek help from a dietitian for individual calorie needs.  Grains  Whole grain or whole wheat bread. Brown rice. Whole grain or whole wheat pasta. Quinoa, bulgur, and whole grain cereals. Low-sodium cereals. Corn or whole wheat flour tortillas. Whole grain cornbread. Whole grain crackers. Low-sodium crackers.  Vegetables  Fresh or frozen vegetables  (raw, steamed, roasted, or grilled). Low-sodium or reduced-sodium tomato and vegetable juices. Low-sodium or reduced-sodium tomato sauce and paste. Low-sodium or reduced-sodium canned vegetables.   Fruits  All fresh, canned (in natural juice), or frozen fruits.  Meat and Other Protein Products  Ground beef (85% or leaner), grass-fed beef, or beef trimmed of fat. Skinless chicken or turkey. Ground chicken or turkey. Pork trimmed of fat. All fish and seafood. Eggs. Dried beans, peas, or lentils. Unsalted nuts and seeds. Unsalted canned beans.  Dairy  Low-fat dairy products, such as skim or 1% milk, 2% or reduced-fat cheeses, low-fat ricotta or cottage cheese, or plain low-fat yogurt. Low-sodium or reduced-sodium cheeses.  Fats and Oils  Tub margarines without trans fats. Light or reduced-fat mayonnaise and salad dressings (reduced sodium). Avocado. Safflower, olive, or canola oils. Natural peanut or almond butter.  Other  Unsalted popcorn and pretzels.  The items listed above may not be a complete list of recommended foods or beverages. Contact your dietitian for more options.  WHAT FOODS ARE NOT RECOMMENDED?  Grains  White bread. White pasta. White rice. Refined cornbread. Bagels and croissants. Crackers that contain trans fat.  Vegetables  Creamed or fried vegetables. Vegetables in a cheese sauce. Regular canned vegetables. Regular canned tomato sauce and paste. Regular tomato and vegetable juices.  Fruits  Dried fruits. Canned fruit in light or heavy syrup. Fruit juice.  Meat and Other Protein   Products  Fatty cuts of meat. Ribs, chicken wings, bacon, sausage, bologna, salami, chitterlings, fatback, hot dogs, bratwurst, and packaged luncheon meats. Salted nuts and seeds. Canned beans with salt.  Dairy  Whole or 2% milk, cream, half-and-half, and cream cheese. Whole-fat or sweetened yogurt. Full-fat cheeses or blue cheese. Nondairy creamers and whipped toppings. Processed cheese, cheese spreads, or cheese  curds.  Condiments  Onion and garlic salt, seasoned salt, table salt, and sea salt. Canned and packaged gravies. Worcestershire sauce. Tartar sauce. Barbecue sauce. Teriyaki sauce. Soy sauce, including reduced sodium. Steak sauce. Fish sauce. Oyster sauce. Cocktail sauce. Horseradish. Ketchup and mustard. Meat flavorings and tenderizers. Bouillon cubes. Hot sauce. Tabasco sauce. Marinades. Taco seasonings. Relishes.  Fats and Oils  Butter, stick margarine, lard, shortening, ghee, and bacon fat. Coconut, palm kernel, or palm oils. Regular salad dressings.  Other  Pickles and olives. Salted popcorn and pretzels.  The items listed above may not be a complete list of foods and beverages to avoid. Contact your dietitian for more information.  WHERE CAN I FIND MORE INFORMATION?  National Heart, Lung, and [employer] Institute: www.nhlbi.nih.gov/health/health-topics/topics/dash/     This information is not intended to replace advice given to you by your health care provider. Make sure you discuss any questions you have with your health care provider.     Document Released: 11/27/2011 Document Revised: 12/29/2014 Document Reviewed: 10/12/2013  Elsevier Interactive Patient Education ©2016 Elsevier Inc.

## 2016-05-22 NOTE — Progress Notes (Signed)
Subjective:    Patient ID: Kari Medina, female    DOB: 19-Sep-1946, 70 y.o.   MRN: [id number]  HPI Here for follow up of several health conditions  She feels some better She has started cetirizine--she finds this has really helped Didn't try dulera No regular cough Has some AM drainage --may cause rare cough Now finds she can walk more--longer distance  Ongoing back and shoulder issues This limits her some Tramadol helps some  Some leg cramps at night Seemed better when she held off on tramadol  Current Outpatient Prescriptions on File Prior to Visit  Medication Sig Dispense Refill  . acetaminophen (TYLENOL) 650 MG CR tablet Take 650 mg by mouth every 6 (six) hours as needed.      Marland Kitchen albuterol (PROAIR HFA) 108 (90 Base) MCG/ACT inhaler Inhale 2 puffs into the lungs every 6 (six) hours as needed.     Marland Kitchen aspirin 81 MG tablet Take 81 mg by mouth daily.    Marland Kitchen b complex vitamins tablet Take 1 tablet by mouth daily.      . calcium carbonate (OS-CAL) 600 MG TABS Take 600 mg by mouth daily with breakfast.     . Cholecalciferol (VITAMIN D) 2000 UNITS CAPS Take 4 capsules by mouth daily.     . hydrochlorothiazide (HYDRODIURIL) 12.5 MG tablet Take 1 tablet by mouth daily.    Marland Kitchen levothyroxine (SYNTHROID, LEVOTHROID) 200 MCG tablet Take 200 mcg by mouth daily.      . Magnesium 250 MG TABS Take 2 tablets by mouth.    . meloxicam (MOBIC) 7.5 MG tablet Take 1 tablet by mouth daily.    . Multiple Vitamin (MULTIVITAMIN) capsule Take 1 capsule by mouth daily.      . propranolol (INDERAL) 40 MG tablet Take 1 tablet by mouth 2 (two) times daily after a meal.    . rOPINIRole (REQUIP) 2 MG tablet Take 1 tablet by mouth daily.    . traMADol (ULTRAM) 50 MG tablet Take 0.5-1 tablets (25-50 mg total) by mouth 3 (three) times daily as needed. 60 tablet 0  . Turmeric Curcumin 500 MG CAPS Take 3 capsules by mouth daily.    . vitamin C (ASCORBIC ACID) 500 MG tablet Take 500 mg by mouth daily.     No  current facility-administered medications on file prior to visit.    Allergies  Allergen Reactions  . Latex Hives  . Nickel Hives  . Prednisone     Past Medical History  Diagnosis Date  . Generalized osteoarthritis of multiple sites   . Hypertension   . Tremor, essential   . OSA (obstructive sleep apnea)   . RLS (restless legs syndrome)   . Raynaud disease     Past Surgical History  Procedure Laterality Date  . Total knee arthroplasty      bilateral  . Thyroidectomy  2006  . Cholecystectomy    . Rhinoplasty  1972    Family History  Problem Relation Age of Onset  . Osteoarthritis Mother   . Diabetes Mother   . Cirrhosis Mother   . Cancer Father   . Heart disease Brother     stents in 1 brother    Social History   Social History  . Marital Status: Married    Spouse Name: N/A  . Number of Children: 1  . Years of Education: N/A   Occupational History  . Neurosurgeon     Retired   Social History Main Topics  .  Smoking status: Former Smoker    Quit date: 12/22/1988  . Smokeless tobacco: Not on file  . Alcohol Use: Not on file  . Drug Use: Not on file  . Sexual Activity: Not on file   Other Topics Concern  . Not on file   Social History Narrative   1 daughter      Has living will   Husband has health care POA   Would allow resuscitation but no prolonged machines   Review of Systems RLS controlled with ropinorole Appetite is fine Weight stable    Objective:   Physical Exam  Constitutional: She appears well-developed. No distress.  Neck: Normal range of motion. Neck supple. No thyromegaly present.  Cardiovascular: Normal rate, regular rhythm and normal heart sounds.  Exam reveals no gallop.   No murmur heard. Pulmonary/Chest: Effort normal and breath sounds normal. No respiratory distress. She has no wheezes. She has no rales.  Lymphadenopathy:    She has no cervical adenopathy.  Psychiatric: She has a normal mood and affect. Her  behavior is normal.          Assessment & Plan:

## 2016-05-22 NOTE — Assessment & Plan Note (Signed)
This diagnosis is in question ?allergic asthma  Cetirizine is helping Will try montelukast also No referral for now (wondered about pulmonary or allergy)

## 2016-05-22 NOTE — Assessment & Plan Note (Signed)
DASH info given Discussed exercise

## 2016-05-22 NOTE — Progress Notes (Signed)
Pre visit review using our clinic review tool, if applicable. No additional management support is needed unless otherwise documented below in the visit note. 

## 2016-06-05 DIAGNOSIS — J449 Chronic obstructive pulmonary disease, unspecified: Secondary | ICD-10-CM | POA: Diagnosis not present

## 2016-06-05 DIAGNOSIS — R0609 Other forms of dyspnea: Secondary | ICD-10-CM | POA: Diagnosis not present

## 2016-06-05 DIAGNOSIS — J310 Chronic rhinitis: Secondary | ICD-10-CM | POA: Diagnosis not present

## 2016-06-05 DIAGNOSIS — G4733 Obstructive sleep apnea (adult) (pediatric): Secondary | ICD-10-CM | POA: Diagnosis not present

## 2016-06-05 DIAGNOSIS — J45909 Unspecified asthma, uncomplicated: Secondary | ICD-10-CM | POA: Diagnosis not present

## 2016-06-05 DIAGNOSIS — Z6841 Body Mass Index (BMI) 40.0 and over, adult: Secondary | ICD-10-CM | POA: Diagnosis not present

## 2016-07-30 DIAGNOSIS — M2550 Pain in unspecified joint: Secondary | ICD-10-CM | POA: Diagnosis not present

## 2016-07-30 DIAGNOSIS — R768 Other specified abnormal immunological findings in serum: Secondary | ICD-10-CM | POA: Diagnosis not present

## 2016-07-30 DIAGNOSIS — J449 Chronic obstructive pulmonary disease, unspecified: Secondary | ICD-10-CM | POA: Diagnosis not present

## 2016-07-30 DIAGNOSIS — I7300 Raynaud's syndrome without gangrene: Secondary | ICD-10-CM | POA: Diagnosis not present

## 2016-07-30 DIAGNOSIS — M150 Primary generalized (osteo)arthritis: Secondary | ICD-10-CM | POA: Diagnosis not present

## 2016-07-30 DIAGNOSIS — M25511 Pain in right shoulder: Secondary | ICD-10-CM | POA: Diagnosis not present

## 2016-08-15 ENCOUNTER — Encounter: Payer: Self-pay | Admitting: Internal Medicine

## 2016-08-15 ENCOUNTER — Ambulatory Visit (INDEPENDENT_AMBULATORY_CARE_PROVIDER_SITE_OTHER): Payer: Medicare Other | Admitting: Internal Medicine

## 2016-08-15 DIAGNOSIS — R252 Cramp and spasm: Secondary | ICD-10-CM | POA: Insufficient documentation

## 2016-08-15 MED ORDER — SYNTHROID 200 MCG PO TABS: 200.0000 ug | ORAL_TABLET | Freq: Every day | ORAL | 3 refills | Status: DC

## 2016-08-15 NOTE — Patient Instructions (Addendum)
Please continue the magnesium and adding over the counter potassium. You should try to do more exercise--like an exercise bike in the evening. You can try tonic water also

## 2016-08-15 NOTE — Progress Notes (Signed)
Subjective:    Patient ID: Kari Medina, female    DOB: 07-Jun-1946, 70 y.o.   MRN: [id number]  HPI Here with a couple of concerns  Concerned about ongoing cramps in legs Occasionally in hands also Leg can feel like it is "twisting on me" [facility] then typical charley horse May be at night but can be in day also Tried aspirin Has tried various home remedies--vinegar, magnesium (helped some) Drinks plenty of fluids  Tried green tea with lemon--to help with appetite  Current Outpatient Prescriptions on File Prior to Visit  Medication Sig Dispense Refill  . acetaminophen (TYLENOL) 650 MG CR tablet Take 650 mg by mouth every 6 (six) hours as needed.      Marland Kitchen albuterol (PROAIR HFA) 108 (90 Base) MCG/ACT inhaler Inhale 2 puffs into the lungs every 6 (six) hours as needed.     Marland Kitchen aspirin 81 MG tablet Take 81 mg by mouth daily.    Marland Kitchen b complex vitamins tablet Take 1 tablet by mouth daily.      . cetirizine (ZYRTEC) 10 MG tablet Take 10 mg by mouth daily.    . Cholecalciferol (VITAMIN D) 2000 UNITS CAPS Take 4 capsules by mouth daily.     . hydrochlorothiazide (HYDRODIURIL) 12.5 MG tablet Take 1 tablet by mouth daily.    Marland Kitchen levothyroxine (SYNTHROID, LEVOTHROID) 200 MCG tablet Take 200 mcg by mouth daily.      . Magnesium 250 MG TABS Take 2 tablets by mouth.    . meloxicam (MOBIC) 7.5 MG tablet Take 1 tablet by mouth daily.    . montelukast (SINGULAIR) 10 MG tablet Take 1 tablet (10 mg total) by mouth at bedtime. 90 tablet 3  . propranolol (INDERAL) 40 MG tablet Take 1 tablet by mouth 2 (two) times daily after a meal.    . rOPINIRole (REQUIP) 2 MG tablet Take 1 tablet by mouth daily.    . traMADol (ULTRAM) 50 MG tablet Take 0.5-1 tablets (25-50 mg total) by mouth 3 (three) times daily as needed. 60 tablet 0  . Turmeric Curcumin 500 MG CAPS Take 3 capsules by mouth daily.    . vitamin C (ASCORBIC ACID) 500 MG tablet Take 500 mg by mouth daily.    . calcium carbonate (OS-CAL) 600 MG  TABS Take 600 mg by mouth daily with breakfast.     . Multiple Vitamin (MULTIVITAMIN) capsule Take 1 capsule by mouth daily.       No current facility-administered medications on file prior to visit.     Allergies  Allergen Reactions  . Latex Hives  . Nickel Hives  . Prednisone     Past Medical History:  Diagnosis Date  . Generalized osteoarthritis of multiple sites   . Hypertension   . OSA (obstructive sleep apnea)   . Raynaud disease   . RLS (restless legs syndrome)   . Tremor, essential     Past Surgical History:  Procedure Laterality Date  . CHOLECYSTECTOMY    . RHINOPLASTY  1972  . THYROIDECTOMY  2006  . TOTAL KNEE ARTHROPLASTY     bilateral    Family History  Problem Relation Age of Onset  . Osteoarthritis Mother   . Diabetes Mother   . Cirrhosis Mother   . Cancer Father   . Heart disease Brother     stents in 1 brother    Social History   Social History  . Marital status: Married    Spouse name: N/A  . Number of  children: 1  . Years of education: N/A   Occupational History  . Neurosurgeon     Retired   Social History Main Topics  . Smoking status: Former Smoker    Quit date: 12/22/1988  . Smokeless tobacco: Not on file  . Alcohol use Not on file  . Drug use: Unknown  . Sexual activity: Not on file   Other Topics Concern  . Not on file   Social History Narrative   1 daughter      Has living will   Husband has health care POA   Would allow resuscitation but no prolonged machines   Review of Systems She feels she does as well on the generic levothyroxine Requests the brand    Objective:   Physical Exam  Constitutional: She appears well-developed and well-nourished. No distress.  Cardiovascular: Intact distal pulses.           Assessment & Plan:

## 2016-08-15 NOTE — Progress Notes (Signed)
Pre visit review using our clinic review tool, if applicable. No additional management support is needed unless otherwise documented below in the visit note. 

## 2016-08-15 NOTE — Assessment & Plan Note (Signed)
Doesn't seem to have pathologic process Discussed OTC remedies Increase exercise Reassured--no vascular issues

## 2016-08-18 DIAGNOSIS — M19011 Primary osteoarthritis, right shoulder: Secondary | ICD-10-CM | POA: Diagnosis not present

## 2016-09-09 ENCOUNTER — Other Ambulatory Visit: Payer: Self-pay | Admitting: Internal Medicine

## 2016-10-27 DIAGNOSIS — R05 Cough: Secondary | ICD-10-CM | POA: Diagnosis not present

## 2016-10-27 DIAGNOSIS — R0609 Other forms of dyspnea: Secondary | ICD-10-CM | POA: Diagnosis not present

## 2016-10-27 DIAGNOSIS — G4733 Obstructive sleep apnea (adult) (pediatric): Secondary | ICD-10-CM | POA: Diagnosis not present

## 2016-10-27 DIAGNOSIS — R0602 Shortness of breath: Secondary | ICD-10-CM | POA: Diagnosis not present

## 2016-11-08 ENCOUNTER — Other Ambulatory Visit: Payer: Self-pay | Admitting: Internal Medicine

## 2016-11-18 ENCOUNTER — Ambulatory Visit (INDEPENDENT_AMBULATORY_CARE_PROVIDER_SITE_OTHER): Payer: Medicare Other | Admitting: Internal Medicine

## 2016-11-18 ENCOUNTER — Encounter: Payer: Self-pay | Admitting: Internal Medicine

## 2016-11-18 VITALS — BP 122/88 | HR 74 | Temp 98.1°F | Wt 288.0 lb

## 2016-11-18 DIAGNOSIS — R58 Hemorrhage, not elsewhere classified: Secondary | ICD-10-CM

## 2016-11-18 LAB — POC URINALSYSI DIPSTICK (AUTOMATED)
Bilirubin, UA: NEGATIVE
Blood, UA: NEGATIVE
Glucose, UA: NEGATIVE
Ketones, UA: NEGATIVE
Leukocytes, UA: NEGATIVE
Nitrite, UA: NEGATIVE
Protein, UA: NEGATIVE
Spec Grav, UA: 1.015
Urobilinogen, UA: 0.2
pH, UA: 6

## 2016-11-18 MED ORDER — TRAMADOL HCL 50 MG PO TABS: 25.0000 mg | ORAL_TABLET | Freq: Three times a day (TID) | ORAL | 0 refills | Status: DC | PRN

## 2016-11-18 NOTE — Patient Instructions (Signed)
If you see anymore blood, please set up an appointment with Dr Enzo Bi

## 2016-11-18 NOTE — Assessment & Plan Note (Signed)
Urinalysis is negative and no symptoms She declines vaginal exam If she has recurrence, she will see gyn

## 2016-11-18 NOTE — Progress Notes (Signed)
Pre visit review using our clinic review tool, if applicable. No additional management support is needed unless otherwise documented below in the visit note. 

## 2016-11-18 NOTE — Addendum Note (Signed)
Addended by: Pilar Grammes on: 11/18/2016 04:56 PM   Modules accepted: Orders

## 2016-11-18 NOTE — Progress Notes (Signed)
Subjective:    Patient ID: Kari Medina, female    DOB: Jul 02, 1946, 70 y.o.   MRN: [id number]  HPI Here due to seeing spot of blood on underwear and pad  Does have some bladder incontinence--thus the pad No dysuria or urgency No clear hematuria No fever  Current Outpatient Prescriptions on File Prior to Visit  Medication Sig Dispense Refill  . acetaminophen (TYLENOL) 650 MG CR tablet Take 650 mg by mouth every 6 (six) hours as needed.      Marland Kitchen albuterol (PROAIR HFA) 108 (90 Base) MCG/ACT inhaler Inhale 2 puffs into the lungs every 6 (six) hours as needed.     Marland Kitchen aspirin 81 MG tablet Take 81 mg by mouth daily.    Marland Kitchen b complex vitamins tablet Take 1 tablet by mouth daily.      . calcium carbonate (OS-CAL) 600 MG TABS Take 600 mg by mouth daily with breakfast.     . cetirizine (ZYRTEC) 10 MG tablet Take 10 mg by mouth daily.    . Cholecalciferol (VITAMIN D) 2000 UNITS CAPS Take 4 capsules by mouth daily.     . hydrochlorothiazide (HYDRODIURIL) 12.5 MG tablet TAKE 1 TABLET(12.5 MG) BY MOUTH EVERY DAY 30 tablet 9  . Magnesium 250 MG TABS Take 2 tablets by mouth.    . meloxicam (MOBIC) 7.5 MG tablet Take 1 tablet by mouth daily.    . montelukast (SINGULAIR) 10 MG tablet Take 1 tablet (10 mg total) by mouth at bedtime. 90 tablet 3  . Multiple Vitamin (MULTIVITAMIN) capsule Take 1 capsule by mouth daily.      Marland Kitchen rOPINIRole (REQUIP) 2 MG tablet TAKE 1 TABLET(2 MG) BY MOUTH EVERY NIGHT 90 tablet 2  . SYNTHROID 200 MCG tablet Take 1 tablet (200 mcg total) by mouth daily before breakfast. 90 tablet 3  . traMADol (ULTRAM) 50 MG tablet Take 0.5-1 tablets (25-50 mg total) by mouth 3 (three) times daily as needed. 60 tablet 0  . Turmeric Curcumin 500 MG CAPS Take 3 capsules by mouth daily.    . vitamin C (ASCORBIC ACID) 500 MG tablet Take 500 mg by mouth daily.    . propranolol (INDERAL) 40 MG tablet Take 1 tablet by mouth 2 (two) times daily after a meal.     No current facility-administered  medications on file prior to visit.     Allergies  Allergen Reactions  . Latex Hives  . Nickel Hives  . Prednisone     Past Medical History:  Diagnosis Date  . Generalized osteoarthritis of multiple sites   . Hypertension   . OSA (obstructive sleep apnea)   . Raynaud disease   . RLS (restless legs syndrome)   . Tremor, essential     Past Surgical History:  Procedure Laterality Date  . CHOLECYSTECTOMY    . RHINOPLASTY  1972  . THYROIDECTOMY  2006  . TOTAL KNEE ARTHROPLASTY     bilateral    Family History  Problem Relation Age of Onset  . Osteoarthritis Mother   . Diabetes Mother   . Cirrhosis Mother   . Cancer Father   . Heart disease Brother     stents in 1 brother    Social History   Social History  . Marital status: Married    Spouse name: N/A  . Number of children: 1  . Years of education: N/A   Occupational History  . Neurosurgeon     Retired   Social History Main Topics  .  Smoking status: Former Smoker    Quit date: 12/22/1988  . Smokeless tobacco: Not on file  . Alcohol use Not on file  . Drug use: Unknown  . Sexual activity: Not on file   Other Topics Concern  . Not on file   Social History Narrative   1 daughter      Has living will   Husband has health care POA   Would allow resuscitation but no prolonged machines   Review of Systems Some back problems---midline pain and into buttocks Some pain down to right knee and groin No vomiting or diarrhea Appetite is fine    Objective:   Physical Exam  Abdominal: Soft. She exhibits no distension. There is no tenderness.  Musculoskeletal:  Normal ROM right hip          Assessment & Plan:

## 2017-01-05 ENCOUNTER — Other Ambulatory Visit: Payer: Self-pay | Admitting: Internal Medicine

## 2017-03-05 DIAGNOSIS — G4733 Obstructive sleep apnea (adult) (pediatric): Secondary | ICD-10-CM | POA: Diagnosis not present

## 2017-03-05 DIAGNOSIS — J310 Chronic rhinitis: Secondary | ICD-10-CM | POA: Diagnosis not present

## 2017-03-05 DIAGNOSIS — J4540 Moderate persistent asthma, uncomplicated: Secondary | ICD-10-CM | POA: Diagnosis not present

## 2017-03-05 DIAGNOSIS — R0609 Other forms of dyspnea: Secondary | ICD-10-CM | POA: Diagnosis not present

## 2017-03-19 DIAGNOSIS — Z0000 Encounter for general adult medical examination without abnormal findings: Secondary | ICD-10-CM | POA: Diagnosis not present

## 2017-03-25 DIAGNOSIS — M19011 Primary osteoarthritis, right shoulder: Secondary | ICD-10-CM | POA: Diagnosis not present

## 2017-03-25 DIAGNOSIS — M25511 Pain in right shoulder: Secondary | ICD-10-CM | POA: Diagnosis not present

## 2017-03-25 DIAGNOSIS — M7581 Other shoulder lesions, right shoulder: Secondary | ICD-10-CM | POA: Diagnosis not present

## 2017-03-25 DIAGNOSIS — G8929 Other chronic pain: Secondary | ICD-10-CM | POA: Diagnosis not present

## 2017-03-25 DIAGNOSIS — Z6841 Body Mass Index (BMI) 40.0 and over, adult: Secondary | ICD-10-CM | POA: Diagnosis not present

## 2017-03-26 ENCOUNTER — Other Ambulatory Visit: Payer: Self-pay | Admitting: Student

## 2017-03-26 DIAGNOSIS — M7581 Other shoulder lesions, right shoulder: Secondary | ICD-10-CM

## 2017-03-26 DIAGNOSIS — M19011 Primary osteoarthritis, right shoulder: Secondary | ICD-10-CM

## 2017-04-02 DIAGNOSIS — J301 Allergic rhinitis due to pollen: Secondary | ICD-10-CM | POA: Diagnosis not present

## 2017-04-02 DIAGNOSIS — J3089 Other allergic rhinitis: Secondary | ICD-10-CM | POA: Diagnosis not present

## 2017-04-02 DIAGNOSIS — J4540 Moderate persistent asthma, uncomplicated: Secondary | ICD-10-CM | POA: Diagnosis not present

## 2017-04-02 DIAGNOSIS — J309 Allergic rhinitis, unspecified: Secondary | ICD-10-CM | POA: Diagnosis not present

## 2017-04-06 ENCOUNTER — Ambulatory Visit
Admission: RE | Admit: 2017-04-06 | Discharge: 2017-04-06 | Disposition: A | Discharge status: Home / Self Care | Payer: Medicare Other | Source: Ambulatory Visit | Attending: Student | Admitting: Student

## 2017-04-06 DIAGNOSIS — M7581 Other shoulder lesions, right shoulder: Secondary | ICD-10-CM

## 2017-04-06 DIAGNOSIS — M19011 Primary osteoarthritis, right shoulder: Secondary | ICD-10-CM

## 2017-04-09 ENCOUNTER — Ambulatory Visit
Admission: RE | Admit: 2017-04-09 | Discharge: 2017-04-09 | Disposition: A | Discharge status: Home / Self Care | Payer: Medicare Other | Source: Ambulatory Visit | Attending: Student | Admitting: Student

## 2017-04-09 DIAGNOSIS — M65811 Other synovitis and tenosynovitis, right shoulder: Secondary | ICD-10-CM | POA: Diagnosis not present

## 2017-04-09 DIAGNOSIS — M7581 Other shoulder lesions, right shoulder: Secondary | ICD-10-CM | POA: Diagnosis not present

## 2017-04-09 DIAGNOSIS — M19011 Primary osteoarthritis, right shoulder: Secondary | ICD-10-CM | POA: Insufficient documentation

## 2017-04-09 DIAGNOSIS — M87821 Other osteonecrosis, right humerus: Secondary | ICD-10-CM | POA: Diagnosis not present

## 2017-04-09 DIAGNOSIS — S46011A Strain of muscle(s) and tendon(s) of the rotator cuff of right shoulder, initial encounter: Secondary | ICD-10-CM | POA: Insufficient documentation

## 2017-04-09 DIAGNOSIS — X58XXXA Exposure to other specified factors, initial encounter: Secondary | ICD-10-CM | POA: Insufficient documentation

## 2017-04-09 DIAGNOSIS — M75101 Unspecified rotator cuff tear or rupture of right shoulder, not specified as traumatic: Secondary | ICD-10-CM | POA: Diagnosis not present

## 2017-04-09 DIAGNOSIS — M25411 Effusion, right shoulder: Secondary | ICD-10-CM | POA: Insufficient documentation

## 2017-04-10 DIAGNOSIS — J3081 Allergic rhinitis due to animal (cat) (dog) hair and dander: Secondary | ICD-10-CM | POA: Diagnosis not present

## 2017-04-10 DIAGNOSIS — J309 Allergic rhinitis, unspecified: Secondary | ICD-10-CM | POA: Diagnosis not present

## 2017-04-10 DIAGNOSIS — J301 Allergic rhinitis due to pollen: Secondary | ICD-10-CM | POA: Diagnosis not present

## 2017-04-15 ENCOUNTER — Ambulatory Visit: Payer: Medicare Other | Attending: Specialist

## 2017-04-15 DIAGNOSIS — G4733 Obstructive sleep apnea (adult) (pediatric): Secondary | ICD-10-CM | POA: Diagnosis not present

## 2017-04-20 DIAGNOSIS — M75121 Complete rotator cuff tear or rupture of right shoulder, not specified as traumatic: Secondary | ICD-10-CM | POA: Diagnosis not present

## 2017-04-20 DIAGNOSIS — M19011 Primary osteoarthritis, right shoulder: Secondary | ICD-10-CM | POA: Diagnosis not present

## 2017-04-29 DIAGNOSIS — Z01818 Encounter for other preprocedural examination: Secondary | ICD-10-CM | POA: Diagnosis not present

## 2017-04-29 DIAGNOSIS — G4733 Obstructive sleep apnea (adult) (pediatric): Secondary | ICD-10-CM | POA: Diagnosis not present

## 2017-04-29 DIAGNOSIS — J310 Chronic rhinitis: Secondary | ICD-10-CM | POA: Diagnosis not present

## 2017-04-29 DIAGNOSIS — J45998 Other asthma: Secondary | ICD-10-CM | POA: Diagnosis not present

## 2017-05-11 DIAGNOSIS — G250 Essential tremor: Secondary | ICD-10-CM | POA: Diagnosis not present

## 2017-05-11 DIAGNOSIS — I10 Essential (primary) hypertension: Secondary | ICD-10-CM | POA: Diagnosis not present

## 2017-05-11 DIAGNOSIS — R0789 Other chest pain: Secondary | ICD-10-CM | POA: Diagnosis not present

## 2017-05-11 DIAGNOSIS — G4733 Obstructive sleep apnea (adult) (pediatric): Secondary | ICD-10-CM | POA: Diagnosis not present

## 2017-05-11 DIAGNOSIS — R0602 Shortness of breath: Secondary | ICD-10-CM | POA: Diagnosis not present

## 2017-05-12 ENCOUNTER — Telehealth: Payer: Self-pay

## 2017-05-12 ENCOUNTER — Ambulatory Visit: Payer: Medicare Other | Attending: Specialist

## 2017-05-12 DIAGNOSIS — G4733 Obstructive sleep apnea (adult) (pediatric): Secondary | ICD-10-CM | POA: Insufficient documentation

## 2017-05-12 NOTE — Telephone Encounter (Signed)
Pt left v/m requesting FYI to Dr Silvio Pate that pt is have a rt shoulder replacement by Dr Roland Rack on 05/28/17.

## 2017-05-19 NOTE — Telephone Encounter (Signed)
Okay I know she has had trouble with her shoulder for quite some time

## 2017-05-21 ENCOUNTER — Encounter
Admission: RE | Admit: 2017-05-21 | Discharge: 2017-05-21 | Disposition: A | Discharge status: Home / Self Care | Payer: Medicare Other | Source: Ambulatory Visit | Attending: Surgery | Admitting: Surgery

## 2017-05-21 DIAGNOSIS — M19011 Primary osteoarthritis, right shoulder: Secondary | ICD-10-CM | POA: Insufficient documentation

## 2017-05-21 DIAGNOSIS — M75121 Complete rotator cuff tear or rupture of right shoulder, not specified as traumatic: Secondary | ICD-10-CM | POA: Insufficient documentation

## 2017-05-21 HISTORY — DX: Personal history of other diseases of the digestive system: Z87.19

## 2017-05-21 HISTORY — DX: Fatty (change of) liver, not elsewhere classified: K76.0

## 2017-05-21 HISTORY — DX: Cardiac arrhythmia, unspecified: I49.9

## 2017-05-21 HISTORY — DX: Dyspnea, unspecified: R06.00

## 2017-05-21 HISTORY — DX: Fibromyalgia: M79.7

## 2017-05-21 HISTORY — DX: Asplenia (congenital): Q89.01

## 2017-05-21 HISTORY — DX: Panic disorder (episodic paroxysmal anxiety): F41.0

## 2017-05-21 HISTORY — DX: Hypothyroidism, unspecified: E03.9

## 2017-05-21 HISTORY — DX: Depression, unspecified: F32.A

## 2017-05-21 HISTORY — DX: Major depressive disorder, single episode, unspecified: F32.9

## 2017-05-21 HISTORY — DX: Pain, unspecified: R52

## 2017-05-21 HISTORY — DX: Unspecified asthma, uncomplicated: J45.909

## 2017-05-21 LAB — BASIC METABOLIC PANEL
Anion gap: 9 (ref 5–15)
BUN: 19 mg/dL (ref 6–20)
CO2: 26 mmol/L (ref 22–32)
Calcium: 10 mg/dL (ref 8.9–10.3)
Chloride: 105 mmol/L (ref 101–111)
Creatinine, Ser: 0.76 mg/dL (ref 0.44–1.00)
GFR calc Af Amer: >60 mL/min (ref >60)
GFR calc non Af Amer: >60 mL/min (ref >60)
Glucose, Bld: 103 mg/dL — ABNORMAL HIGH (ref 65–99)
Potassium: 3.7 mmol/L (ref 3.5–5.1)
Sodium: 140 mmol/L (ref 135–145)

## 2017-05-21 LAB — URINALYSIS, ROUTINE W REFLEX MICROSCOPIC
Bilirubin Urine: NEGATIVE
Glucose, UA: NEGATIVE mg/dL
Hgb urine dipstick: NEGATIVE
Ketones, ur: NEGATIVE mg/dL
Leukocytes, UA: NEGATIVE
Nitrite: NEGATIVE
Protein, ur: NEGATIVE mg/dL
Specific Gravity, Urine: 1.019 (ref 1.005–1.030)
pH: 6 (ref 5.0–8.0)

## 2017-05-21 LAB — CBC
HCT: 47.4 % — ABNORMAL HIGH (ref 35.0–47.0)
Hemoglobin: 16 g/dL (ref 12.0–16.0)
MCH: 30.8 pg (ref 26.0–34.0)
MCHC: 33.8 g/dL (ref 32.0–36.0)
MCV: 91.1 fL (ref 80.0–100.0)
Platelets: 293 10*3/uL (ref 150–440)
RBC: 5.2 MIL/uL (ref 3.80–5.20)
RDW: 16.3 % — ABNORMAL HIGH (ref 11.5–14.5)
WBC: 11.2 10*3/uL — ABNORMAL HIGH (ref 3.6–11.0)

## 2017-05-21 LAB — PROTIME-INR
INR: 0.88
Prothrombin Time: 11.9 seconds (ref 11.4–15.2)

## 2017-05-21 LAB — SURGICAL PCR SCREEN
MRSA, PCR: NEGATIVE
Staphylococcus aureus: NEGATIVE

## 2017-05-21 MED ORDER — FAMOTIDINE 20 MG PO TABS
ORAL_TABLET | ORAL | Status: AC
  Filled 2017-05-21: qty 1

## 2017-05-21 MED ORDER — CEFAZOLIN SODIUM-DEXTROSE 2-4 GM/100ML-% IV SOLN
INTRAVENOUS | Status: AC
  Filled 2017-05-21: qty 100

## 2017-05-21 NOTE — Pre-Procedure Instructions (Signed)
DR [facility] NOTIFIED PATIENT STATES 2 BACK SURGERIES 2008/2011 WOKE UP WITH FEVER. WAS TOLD HAD PNEUMONIA X1. NO KNOWN HISTORY OF ANESTHESIA PROBLEMS IN FAMILY HISTORY

## 2017-05-21 NOTE — Patient Instructions (Signed)
Your procedure is scheduled on: 05/28/17 Report to Same Day Surgery 2nd floor medical mall [facility] Entrance-take elevator on left to 2nd floor.  Check in with surgery information desk.) To find out your arrival time please call [phone] between 1PM - 3PM on 05/27/17  Remember: Instructions that are not followed completely may result in serious medical risk, up to and including death, or upon the discretion of your surgeon and anesthesiologist your surgery may need to be rescheduled.    _x___ 1. Do not eat food or drink liquids after midnight. No gum chewing or                              hard candies.     __x__ 2. No Alcohol for 24 hours before or after surgery.   __x__3. No Smoking for 24 prior to surgery.   ____  4. Bring all medications with you on the day of surgery if instructed.    __x__ 5. Notify your doctor if there is any change in your medical condition     (cold, fever, infections).     Do not wear jewelry, make-up, hairpins, clips or nail polish.  Do not wear lotions, powders, or perfumes. You may wear deodorant.  Do not shave 48 hours prior to surgery. Men may shave face and neck.  Do not bring valuables to the hospital.    [facility] is not responsible for any belongings or valuables.               Contacts, dentures or bridgework may not be worn into surgery.  Leave your suitcase in the car. After surgery it may be brought to your room.  For patients admitted to the hospital, discharge time is determined by your                       treatment team.   Patients discharged the day of surgery will not be allowed to drive home.  You will need someone to drive you home and stay with you the night of your procedure.    Please read over the following fact sheets that you were given:   [facility] Preparing for Surgery and or MRSA Information   _x___ Take anti-hypertensive (unless it includes a diuretic), cardiac, seizure, asthma,     anti-reflux and psychiatric  medicines. These include:  1. magnesium  2. propranolol  3. synthroid  4.  5.  6.  ____Fleets enema or Magnesium Citrate as directed.   _x___ Use CHG Soap or sage wipes as directed on instruction sheet   _x___ Use inhalers on the day of surgery and bring to hospital day of surgery  ____ Stop Metformin and Janumet 2 days prior to surgery.    ____ Take 1/2 of usual insulin dose the night before surgery and none on the morning     surgery.   _x___ Follow recommendations from Cardiologist, Pulmonologist or PCP regarding          stopping Aspirin, Coumadin, Pllavix ,Eliquis, Effient, or Pradaxa, and Pletal.  STOPPING ASPIRIN 1 WEEK PREOP  X____Stop Anti-inflammatories such as Advil, Aleve, Ibuprofen, Motrin, Naproxen, Naprosyn, Goodies powders or aspirin products. OK to take Tylenol and                          Celebrex.   _x___ Stop supplements until after surgery.  But  may continue Vitamin D, Vitamin B,       and multivitamin.   _X___ Bring C-Pap to the hospital.

## 2017-05-22 ENCOUNTER — Encounter: Payer: Medicare Other | Admitting: Internal Medicine

## 2017-05-23 LAB — URINE CULTURE: Culture: 80000 — AB

## 2017-05-24 ENCOUNTER — Other Ambulatory Visit: Payer: Self-pay | Admitting: Internal Medicine

## 2017-05-25 DIAGNOSIS — R0789 Other chest pain: Secondary | ICD-10-CM | POA: Diagnosis not present

## 2017-05-25 NOTE — Pre-Procedure Instructions (Signed)
uc called and faxed to tiffany at dr poggi's

## 2017-05-26 NOTE — Pre-Procedure Instructions (Signed)
NEGATIVE STRESS 05/25/17.

## 2017-05-27 LAB — TYPE AND SCREEN
ABO/RH(D): A POS
Antibody Screen: NEGATIVE

## 2017-05-28 ENCOUNTER — Encounter: Payer: Self-pay | Admitting: *Deleted

## 2017-05-28 ENCOUNTER — Inpatient Hospital Stay: Payer: Medicare Other | Admitting: Anesthesiology

## 2017-05-28 ENCOUNTER — Inpatient Hospital Stay
Admission: RE | Admit: 2017-05-28 | Discharge: 2017-05-29 | DRG: 483 | Disposition: A | Discharge status: Home Health | Payer: Medicare Other | Source: Ambulatory Visit | Attending: Surgery | Admitting: Surgery

## 2017-05-28 ENCOUNTER — Encounter
Admission: RE | Disposition: A | Discharge status: Home Health | Payer: Self-pay | Source: Ambulatory Visit | Attending: Surgery

## 2017-05-28 ENCOUNTER — Inpatient Hospital Stay: Payer: Medicare Other

## 2017-05-28 DIAGNOSIS — Z96611 Presence of right artificial shoulder joint: Secondary | ICD-10-CM

## 2017-05-28 DIAGNOSIS — G2581 Restless legs syndrome: Secondary | ICD-10-CM | POA: Diagnosis present

## 2017-05-28 DIAGNOSIS — M19011 Primary osteoarthritis, right shoulder: Principal | ICD-10-CM | POA: Diagnosis present

## 2017-05-28 DIAGNOSIS — Z87891 Personal history of nicotine dependence: Secondary | ICD-10-CM | POA: Diagnosis not present

## 2017-05-28 DIAGNOSIS — M797 Fibromyalgia: Secondary | ICD-10-CM | POA: Diagnosis present

## 2017-05-28 DIAGNOSIS — E039 Hypothyroidism, unspecified: Secondary | ICD-10-CM | POA: Diagnosis present

## 2017-05-28 DIAGNOSIS — K449 Diaphragmatic hernia without obstruction or gangrene: Secondary | ICD-10-CM | POA: Diagnosis present

## 2017-05-28 DIAGNOSIS — M75121 Complete rotator cuff tear or rupture of right shoulder, not specified as traumatic: Secondary | ICD-10-CM | POA: Diagnosis present

## 2017-05-28 DIAGNOSIS — I739 Peripheral vascular disease, unspecified: Secondary | ICD-10-CM | POA: Diagnosis present

## 2017-05-28 DIAGNOSIS — I7300 Raynaud's syndrome without gangrene: Secondary | ICD-10-CM | POA: Diagnosis present

## 2017-05-28 DIAGNOSIS — J45909 Unspecified asthma, uncomplicated: Secondary | ICD-10-CM | POA: Diagnosis present

## 2017-05-28 DIAGNOSIS — J449 Chronic obstructive pulmonary disease, unspecified: Secondary | ICD-10-CM | POA: Diagnosis present

## 2017-05-28 DIAGNOSIS — Z96653 Presence of artificial knee joint, bilateral: Secondary | ICD-10-CM | POA: Diagnosis present

## 2017-05-28 DIAGNOSIS — G4733 Obstructive sleep apnea (adult) (pediatric): Secondary | ICD-10-CM | POA: Diagnosis present

## 2017-05-28 DIAGNOSIS — Z79899 Other long term (current) drug therapy: Secondary | ICD-10-CM

## 2017-05-28 DIAGNOSIS — I10 Essential (primary) hypertension: Secondary | ICD-10-CM | POA: Diagnosis present

## 2017-05-28 DIAGNOSIS — F329 Major depressive disorder, single episode, unspecified: Secondary | ICD-10-CM | POA: Diagnosis present

## 2017-05-28 DIAGNOSIS — K760 Fatty (change of) liver, not elsewhere classified: Secondary | ICD-10-CM | POA: Diagnosis present

## 2017-05-28 DIAGNOSIS — M75101 Unspecified rotator cuff tear or rupture of right shoulder, not specified as traumatic: Secondary | ICD-10-CM | POA: Diagnosis not present

## 2017-05-28 DIAGNOSIS — F418 Other specified anxiety disorders: Secondary | ICD-10-CM | POA: Diagnosis not present

## 2017-05-28 HISTORY — PX: REVERSE SHOULDER ARTHROPLASTY: SHX5054

## 2017-05-28 LAB — ABO/RH: ABO/RH(D): A POS

## 2017-05-28 SURGERY — ARTHROPLASTY, SHOULDER, TOTAL, REVERSE
Anesthesia: General | Site: Shoulder | Laterality: Right | Wound class: Clean

## 2017-05-28 MED ORDER — PANTOPRAZOLE SODIUM 40 MG PO TBEC
40.0000 mg | DELAYED_RELEASE_TABLET | Freq: Every day | ORAL | Status: DC
  Administered 2017-05-28 – 2017-05-29 (×2): 40 mg via ORAL
  Filled 2017-05-28 (×2): qty 1

## 2017-05-28 MED ORDER — NEOMYCIN-POLYMYXIN B GU 40-200000 IR SOLN
Status: DC | PRN
  Administered 2017-05-28: 14 mL

## 2017-05-28 MED ORDER — FENTANYL CITRATE (PF) 100 MCG/2ML IJ SOLN
25.0000 ug | INTRAMUSCULAR | Status: AC | PRN
  Administered 2017-05-28 (×6): 25 ug via INTRAVENOUS

## 2017-05-28 MED ORDER — ACETAMINOPHEN 10 MG/ML IV SOLN
INTRAVENOUS | Status: AC
  Filled 2017-05-28: qty 100

## 2017-05-28 MED ORDER — KETOROLAC TROMETHAMINE 15 MG/ML IJ SOLN: 15.0000 mg | Freq: Four times a day (QID) | INTRAMUSCULAR | Status: DC

## 2017-05-28 MED ORDER — ROPINIROLE HCL 1 MG PO TABS
2.0000 mg | ORAL_TABLET | Freq: Every day | ORAL | Status: DC
  Administered 2017-05-28: 2 mg via ORAL
  Filled 2017-05-28: qty 2

## 2017-05-28 MED ORDER — ENOXAPARIN SODIUM 40 MG/0.4ML ~~LOC~~ SOLN: 40.0000 mg | SUBCUTANEOUS | Status: DC

## 2017-05-28 MED ORDER — ONDANSETRON HCL 4 MG/2ML IJ SOLN
INTRAMUSCULAR | Status: AC
  Filled 2017-05-28: qty 2

## 2017-05-28 MED ORDER — BUPIVACAINE-EPINEPHRINE (PF) 0.5% -1:200000 IJ SOLN
INTRAMUSCULAR | Status: AC
  Filled 2017-05-28: qty 30

## 2017-05-28 MED ORDER — PROPOFOL 10 MG/ML IV BOLUS
INTRAVENOUS | Status: AC
  Filled 2017-05-28: qty 20

## 2017-05-28 MED ORDER — DIPHENHYDRAMINE HCL 12.5 MG/5ML PO ELIX: 12.5000 mg | ORAL_SOLUTION | ORAL | Status: DC | PRN

## 2017-05-28 MED ORDER — PROPRANOLOL HCL 10 MG PO TABS
40.0000 mg | ORAL_TABLET | Freq: Two times a day (BID) | ORAL | Status: DC
  Administered 2017-05-29: 40 mg via ORAL
  Filled 2017-05-28 (×2): qty 4

## 2017-05-28 MED ORDER — ACETAMINOPHEN 500 MG PO TABS
1000.0000 mg | ORAL_TABLET | Freq: Four times a day (QID) | ORAL | Status: DC
  Administered 2017-05-28 – 2017-05-29 (×3): 1000 mg via ORAL
  Filled 2017-05-28 (×3): qty 2

## 2017-05-28 MED ORDER — HYDROMORPHONE HCL 1 MG/ML IJ SOLN: 1.0000 mg | INTRAMUSCULAR | Status: DC | PRN

## 2017-05-28 MED ORDER — MAGNESIUM HYDROXIDE 400 MG/5ML PO SUSP: 30.0000 mL | Freq: Every day | ORAL | Status: DC | PRN

## 2017-05-28 MED ORDER — FLUTICASONE PROPIONATE 50 MCG/ACT NA SUSP
1.0000 | Freq: Every day | NASAL | Status: DC | PRN
  Filled 2017-05-28: qty 16

## 2017-05-28 MED ORDER — ACETAMINOPHEN 650 MG RE SUPP: 650.0000 mg | Freq: Four times a day (QID) | RECTAL | Status: DC | PRN

## 2017-05-28 MED ORDER — MONTELUKAST SODIUM 10 MG PO TABS
10.0000 mg | ORAL_TABLET | Freq: Every day | ORAL | Status: DC
  Administered 2017-05-28: 10 mg via ORAL
  Filled 2017-05-28: qty 1

## 2017-05-28 MED ORDER — TRANEXAMIC ACID 1000 MG/10ML IV SOLN
INTRAVENOUS | Status: AC
  Filled 2017-05-28: qty 10

## 2017-05-28 MED ORDER — PROPOFOL 10 MG/ML IV BOLUS
INTRAVENOUS | Status: DC | PRN
  Administered 2017-05-28: 120 mg via INTRAVENOUS
  Administered 2017-05-28: 40 mg via INTRAVENOUS

## 2017-05-28 MED ORDER — EPHEDRINE SULFATE 50 MG/ML IJ SOLN
INTRAMUSCULAR | Status: AC
  Filled 2017-05-28: qty 1

## 2017-05-28 MED ORDER — DOCUSATE SODIUM 100 MG PO CAPS
100.0000 mg | ORAL_CAPSULE | Freq: Two times a day (BID) | ORAL | Status: DC
  Administered 2017-05-28 – 2017-05-29 (×3): 100 mg via ORAL
  Filled 2017-05-28 (×3): qty 1

## 2017-05-28 MED ORDER — KETOROLAC TROMETHAMINE 15 MG/ML IJ SOLN
30.0000 mg | Freq: Four times a day (QID) | INTRAMUSCULAR | Status: DC
  Administered 2017-05-28: 30 mg via INTRAVENOUS

## 2017-05-28 MED ORDER — LORATADINE 10 MG PO TABS
10.0000 mg | ORAL_TABLET | Freq: Every day | ORAL | Status: DC
  Administered 2017-05-28 – 2017-05-29 (×2): 10 mg via ORAL
  Filled 2017-05-28 (×2): qty 1

## 2017-05-28 MED ORDER — SUGAMMADEX SODIUM 500 MG/5ML IV SOLN
INTRAVENOUS | Status: AC
  Filled 2017-05-28: qty 5

## 2017-05-28 MED ORDER — SODIUM CHLORIDE 0.9 % IV SOLN
INTRAVENOUS | Status: DC | PRN
  Administered 2017-05-28: 60 mL

## 2017-05-28 MED ORDER — MAGNESIUM OXIDE 400 (241.3 MG) MG PO TABS
400.0000 mg | ORAL_TABLET | Freq: Two times a day (BID) | ORAL | Status: DC
  Administered 2017-05-28 – 2017-05-29 (×2): 400 mg via ORAL
  Filled 2017-05-28 (×2): qty 1

## 2017-05-28 MED ORDER — KETOROLAC TROMETHAMINE 15 MG/ML IJ SOLN
30.0000 mg | Freq: Once | INTRAMUSCULAR | Status: AC
  Administered 2017-05-28: 30 mg via INTRAVENOUS
  Filled 2017-05-28: qty 2

## 2017-05-28 MED ORDER — DOCUSATE SODIUM 100 MG PO CAPS: 100.0000 mg | ORAL_CAPSULE | Freq: Two times a day (BID) | ORAL | Status: DC

## 2017-05-28 MED ORDER — ONDANSETRON HCL 4 MG/2ML IJ SOLN: 4.0000 mg | Freq: Four times a day (QID) | INTRAMUSCULAR | Status: DC | PRN

## 2017-05-28 MED ORDER — DEXAMETHASONE SODIUM PHOSPHATE 10 MG/ML IJ SOLN
INTRAMUSCULAR | Status: DC | PRN
  Administered 2017-05-28: 10 mg via INTRAVENOUS

## 2017-05-28 MED ORDER — KETOROLAC TROMETHAMINE 30 MG/ML IJ SOLN
INTRAMUSCULAR | Status: AC
  Filled 2017-05-28: qty 1

## 2017-05-28 MED ORDER — DEXAMETHASONE SODIUM PHOSPHATE 10 MG/ML IJ SOLN
INTRAMUSCULAR | Status: AC
  Filled 2017-05-28: qty 1

## 2017-05-28 MED ORDER — ROCURONIUM BROMIDE 100 MG/10ML IV SOLN
INTRAVENOUS | Status: DC | PRN
  Administered 2017-05-28: 10 mg via INTRAVENOUS
  Administered 2017-05-28: 20 mg via INTRAVENOUS
  Administered 2017-05-28: 50 mg via INTRAVENOUS

## 2017-05-28 MED ORDER — ACETAMINOPHEN 325 MG PO TABS: 650.0000 mg | ORAL_TABLET | Freq: Four times a day (QID) | ORAL | Status: DC | PRN

## 2017-05-28 MED ORDER — SUGAMMADEX SODIUM 500 MG/5ML IV SOLN
INTRAVENOUS | Status: DC | PRN
  Administered 2017-05-28: 100 mg via INTRAVENOUS
  Administered 2017-05-28: 150 mg via INTRAVENOUS

## 2017-05-28 MED ORDER — FAMOTIDINE 20 MG PO TABS
ORAL_TABLET | ORAL | Status: AC
  Administered 2017-05-28: 20 mg via ORAL
  Filled 2017-05-28: qty 1

## 2017-05-28 MED ORDER — RENA-VITE PO TABS
1.0000 | ORAL_TABLET | Freq: Every day | ORAL | Status: DC
  Administered 2017-05-28 – 2017-05-29 (×2): 1 via ORAL
  Filled 2017-05-28 (×2): qty 1

## 2017-05-28 MED ORDER — OXYCODONE HCL 5 MG PO TABS: 5.0000 mg | ORAL_TABLET | ORAL | Status: DC | PRN

## 2017-05-28 MED ORDER — HYDROMORPHONE HCL 1 MG/ML IJ SOLN
INTRAMUSCULAR | Status: AC
  Filled 2017-05-28: qty 1

## 2017-05-28 MED ORDER — FAMOTIDINE 20 MG PO TABS
20.0000 mg | ORAL_TABLET | Freq: Once | ORAL | Status: AC
  Administered 2017-05-28: 20 mg via ORAL

## 2017-05-28 MED ORDER — CEFAZOLIN SODIUM 10 G IJ SOLR
3.0000 g | Freq: Once | INTRAMUSCULAR | Status: AC
  Administered 2017-05-28: 3 g via INTRAVENOUS
  Filled 2017-05-28: qty 3000

## 2017-05-28 MED ORDER — BISACODYL 10 MG RE SUPP: 10.0000 mg | Freq: Every day | RECTAL | Status: DC | PRN

## 2017-05-28 MED ORDER — SODIUM CHLORIDE 0.9 % IJ SOLN
INTRAMUSCULAR | Status: AC
  Filled 2017-05-28: qty 50

## 2017-05-28 MED ORDER — FLEET ENEMA 7-19 GM/118ML RE ENEM: 1.0000 | ENEMA | Freq: Once | RECTAL | Status: DC | PRN

## 2017-05-28 MED ORDER — ASPIRIN EC 81 MG PO TBEC
81.0000 mg | DELAYED_RELEASE_TABLET | Freq: Every day | ORAL | Status: DC
  Administered 2017-05-28 – 2017-05-29 (×2): 81 mg via ORAL
  Filled 2017-05-28 (×2): qty 1

## 2017-05-28 MED ORDER — ALBUTEROL SULFATE (2.5 MG/3ML) 0.083% IN NEBU: 2.5000 mg | INHALATION_SOLUTION | Freq: Four times a day (QID) | RESPIRATORY_TRACT | Status: DC | PRN

## 2017-05-28 MED ORDER — ROCURONIUM BROMIDE 50 MG/5ML IV SOLN
INTRAVENOUS | Status: AC
  Filled 2017-05-28: qty 1

## 2017-05-28 MED ORDER — ONDANSETRON HCL 4 MG/2ML IJ SOLN: 4.0000 mg | Freq: Once | INTRAMUSCULAR | Status: DC | PRN

## 2017-05-28 MED ORDER — PANTOPRAZOLE SODIUM 40 MG PO TBEC: 40.0000 mg | DELAYED_RELEASE_TABLET | Freq: Every day | ORAL | Status: DC

## 2017-05-28 MED ORDER — ONDANSETRON HCL 4 MG/2ML IJ SOLN
INTRAMUSCULAR | Status: DC | PRN
  Administered 2017-05-28: 4 mg via INTRAVENOUS

## 2017-05-28 MED ORDER — BUPIVACAINE-EPINEPHRINE (PF) 0.5% -1:200000 IJ SOLN
INTRAMUSCULAR | Status: DC | PRN
Start: 2017-05-28 — End: 2017-05-28
  Administered 2017-05-28: 30 mL via PERINEURAL

## 2017-05-28 MED ORDER — NEOMYCIN-POLYMYXIN B GU 40-200000 IR SOLN
Status: AC
  Filled 2017-05-28: qty 20

## 2017-05-28 MED ORDER — ALBUTEROL SULFATE HFA 108 (90 BASE) MCG/ACT IN AERS: 2.0000 | INHALATION_SPRAY | Freq: Four times a day (QID) | RESPIRATORY_TRACT | Status: DC | PRN

## 2017-05-28 MED ORDER — FENTANYL CITRATE (PF) 100 MCG/2ML IJ SOLN
INTRAMUSCULAR | Status: AC
  Filled 2017-05-28: qty 2

## 2017-05-28 MED ORDER — ONDANSETRON HCL 4 MG PO TABS: 4.0000 mg | ORAL_TABLET | Freq: Four times a day (QID) | ORAL | Status: DC | PRN

## 2017-05-28 MED ORDER — VITAMIN D 1000 UNITS PO TABS
2000.0000 [IU] | ORAL_TABLET | Freq: Two times a day (BID) | ORAL | Status: DC
  Administered 2017-05-28 – 2017-05-29 (×2): 2000 [IU] via ORAL
  Filled 2017-05-28 (×2): qty 2

## 2017-05-28 MED ORDER — DEXTROSE 5 % IV SOLN: 3.0000 g | Freq: Four times a day (QID) | INTRAVENOUS | Status: DC

## 2017-05-28 MED ORDER — LEVOTHYROXINE SODIUM 100 MCG PO TABS
200.0000 ug | ORAL_TABLET | Freq: Every day | ORAL | Status: DC
  Administered 2017-05-29: 200 ug via ORAL
  Filled 2017-05-28: qty 2

## 2017-05-28 MED ORDER — CALCIUM CARBONATE ANTACID 500 MG PO CHEW
3.0000 | CHEWABLE_TABLET | Freq: Every day | ORAL | Status: DC
  Filled 2017-05-28: qty 3

## 2017-05-28 MED ORDER — SUCCINYLCHOLINE CHLORIDE 20 MG/ML IJ SOLN
INTRAMUSCULAR | Status: AC
  Filled 2017-05-28: qty 1

## 2017-05-28 MED ORDER — SUCCINYLCHOLINE CHLORIDE 20 MG/ML IJ SOLN
INTRAMUSCULAR | Status: DC | PRN
  Administered 2017-05-28: 100 mg via INTRAVENOUS

## 2017-05-28 MED ORDER — BUPIVACAINE LIPOSOME 1.3 % IJ SUSP
INTRAMUSCULAR | Status: AC
  Filled 2017-05-28: qty 20

## 2017-05-28 MED ORDER — HYDROMORPHONE HCL 1 MG/ML IJ SOLN
0.2500 mg | INTRAMUSCULAR | Status: DC | PRN
  Administered 2017-05-28 (×4): 0.5 mg via INTRAVENOUS

## 2017-05-28 MED ORDER — PHENYLEPHRINE HCL 10 MG/ML IJ SOLN
INTRAMUSCULAR | Status: AC
  Filled 2017-05-28: qty 1

## 2017-05-28 MED ORDER — METOCLOPRAMIDE HCL 10 MG PO TABS: 5.0000 mg | ORAL_TABLET | Freq: Three times a day (TID) | ORAL | Status: DC | PRN

## 2017-05-28 MED ORDER — TRANEXAMIC ACID 1000 MG/10ML IV SOLN
INTRAVENOUS | Status: DC | PRN
  Administered 2017-05-28: 1000 mg via INTRAVENOUS

## 2017-05-28 MED ORDER — HYDROCHLOROTHIAZIDE 25 MG PO TABS
12.5000 mg | ORAL_TABLET | Freq: Every day | ORAL | Status: DC
  Administered 2017-05-28 – 2017-05-29 (×2): 12.5 mg via ORAL
  Filled 2017-05-28 (×2): qty 1

## 2017-05-28 MED ORDER — ACETAMINOPHEN 500 MG PO TABS
1000.0000 mg | ORAL_TABLET | Freq: Four times a day (QID) | ORAL | Status: DC
  Administered 2017-05-28: 1000 mg via ORAL
  Filled 2017-05-28: qty 2

## 2017-05-28 MED ORDER — FENTANYL CITRATE (PF) 250 MCG/5ML IJ SOLN
INTRAMUSCULAR | Status: AC
  Filled 2017-05-28: qty 5

## 2017-05-28 MED ORDER — ENOXAPARIN SODIUM 40 MG/0.4ML ~~LOC~~ SOLN
40.0000 mg | SUBCUTANEOUS | Status: DC
  Administered 2017-05-29: 40 mg via SUBCUTANEOUS
  Filled 2017-05-28: qty 0.4

## 2017-05-28 MED ORDER — KCL IN DEXTROSE-NACL 20-5-0.9 MEQ/L-%-% IV SOLN: INTRAVENOUS | Status: DC

## 2017-05-28 MED ORDER — VITAMIN C 500 MG PO TABS
500.0000 mg | ORAL_TABLET | Freq: Every day | ORAL | Status: DC
  Administered 2017-05-28 – 2017-05-29 (×2): 500 mg via ORAL
  Filled 2017-05-28 (×2): qty 1

## 2017-05-28 MED ORDER — METOCLOPRAMIDE HCL 5 MG/ML IJ SOLN: 5.0000 mg | Freq: Three times a day (TID) | INTRAMUSCULAR | Status: DC | PRN

## 2017-05-28 MED ORDER — EPHEDRINE SULFATE 50 MG/ML IJ SOLN
INTRAMUSCULAR | Status: DC | PRN
  Administered 2017-05-28 (×2): 10 mg via INTRAVENOUS

## 2017-05-28 MED ORDER — TURMERIC CURCUMIN 500 MG PO CAPS: 500.0000 mg | ORAL_CAPSULE | Freq: Every day | ORAL | Status: DC

## 2017-05-28 MED ORDER — LACTATED RINGERS IV SOLN
INTRAVENOUS | Status: DC
  Administered 2017-05-28: 07:00:00 via INTRAVENOUS

## 2017-05-28 MED ORDER — ACETAMINOPHEN 10 MG/ML IV SOLN
INTRAVENOUS | Status: DC | PRN
  Administered 2017-05-28: 1000 mg via INTRAVENOUS

## 2017-05-28 MED ORDER — KCL IN DEXTROSE-NACL 20-5-0.9 MEQ/L-%-% IV SOLN
INTRAVENOUS | Status: DC
  Administered 2017-05-28: 13:00:00 via INTRAVENOUS
  Filled 2017-05-28 (×4): qty 1000

## 2017-05-28 MED ORDER — DEXTROSE 5 % IV SOLN
3.0000 g | Freq: Four times a day (QID) | INTRAVENOUS | Status: AC
  Administered 2017-05-28 (×3): 3 g via INTRAVENOUS
  Filled 2017-05-28 (×3): qty 3000

## 2017-05-28 MED ORDER — RISAQUAD PO CAPS
1.0000 | ORAL_CAPSULE | Freq: Every day | ORAL | Status: DC
  Administered 2017-05-28 – 2017-05-29 (×2): 1 via ORAL
  Filled 2017-05-28 (×2): qty 1

## 2017-05-28 MED ORDER — FENTANYL CITRATE (PF) 100 MCG/2ML IJ SOLN
INTRAMUSCULAR | Status: DC | PRN
  Administered 2017-05-28: 50 ug via INTRAVENOUS
  Administered 2017-05-28: 100 ug via INTRAVENOUS
  Administered 2017-05-28 (×2): 50 ug via INTRAVENOUS

## 2017-05-28 MED ORDER — CALCIUM CARBONATE 600 MG PO TABS: 600.0000 mg | ORAL_TABLET | Freq: Every day | ORAL | Status: DC

## 2017-05-28 MED ORDER — POTASSIUM 99 MG PO TABS
99.0000 mg | ORAL_TABLET | Freq: Every day | ORAL | Status: DC
Start: 2017-05-28 — End: 2017-05-28

## 2017-05-28 MED ORDER — KETOROLAC TROMETHAMINE 15 MG/ML IJ SOLN
15.0000 mg | Freq: Four times a day (QID) | INTRAMUSCULAR | Status: DC
  Administered 2017-05-28 (×3): 15 mg via INTRAVENOUS
  Filled 2017-05-28 (×4): qty 1

## 2017-05-28 MED ORDER — FLUTICASONE FUROATE-VILANTEROL 100-25 MCG/INH IN AEPB
1.0000 | INHALATION_SPRAY | Freq: Every day | RESPIRATORY_TRACT | Status: DC
  Administered 2017-05-28: 1 via RESPIRATORY_TRACT
  Filled 2017-05-28: qty 28

## 2017-05-28 SURGICAL SUPPLY — 76 items
BASEPLATE GLENOSPHERE 25 (Plate) ×1 IMPLANT
BEARING HUIMERAL E1 44X36 STD (Miscellaneous) IMPLANT
BIT DRILL TWIST 2.7 (BIT) ×1 IMPLANT
BLADE SAGITTAL WIDE XTHICK NO (BLADE) ×2 IMPLANT
BRNG HUM STD 44-36 SHLDR E1 (Miscellaneous) ×1 IMPLANT
CANISTER SUCT 1200ML W/VALVE (MISCELLANEOUS) ×2 IMPLANT
CANISTER SUCT 3000ML PPV (MISCELLANEOUS) ×4 IMPLANT
CAPT SHLDR REVTOTAL 2 ×1 IMPLANT
CATH TRAY METER 16FR LF (MISCELLANEOUS) ×2 IMPLANT
CHLORAPREP W/TINT 26ML (MISCELLANEOUS) ×2 IMPLANT
COOLER POLAR GLACIER W/PUMP (MISCELLANEOUS) ×2 IMPLANT
CRADLE LAMINECT ARM (MISCELLANEOUS) ×2 IMPLANT
DRAPE IMP U-DRAPE 54X76 (DRAPES) ×4 IMPLANT
DRAPE INCISE IOBAN 66X45 STRL (DRAPES) ×4 IMPLANT
DRAPE INCISE IOBAN 66X60 STRL (DRAPES) ×2 IMPLANT
DRAPE SHEET LG 3/4 BI-LAMINATE (DRAPES) ×4 IMPLANT
DRAPE TABLE BACK 80X90 (DRAPES) ×2 IMPLANT
DRSG OPSITE POSTOP 4X8 (GAUZE/BANDAGES/DRESSINGS) ×2 IMPLANT
ELECT CAUTERY BLADE 6.4 (BLADE) ×2 IMPLANT
GLENOID SPHERE STD STRL 36MM (Orthopedic Implant) ×1 IMPLANT
GLOVE BIO SURGEON STRL SZ7.5 (GLOVE) ×8 IMPLANT
GLOVE BIO SURGEON STRL SZ8 (GLOVE) ×8 IMPLANT
GLOVE BIOGEL PI IND STRL 8 (GLOVE) ×1 IMPLANT
GLOVE BIOGEL PI INDICATOR 8 (GLOVE) ×1
GLOVE INDICATOR 8.0 STRL GRN (GLOVE) ×2 IMPLANT
GOWN STRL REUS W/ TWL LRG LVL3 (GOWN DISPOSABLE) ×1 IMPLANT
GOWN STRL REUS W/ TWL XL LVL3 (GOWN DISPOSABLE) ×1 IMPLANT
GOWN STRL REUS W/TWL LRG LVL3 (GOWN DISPOSABLE) ×2
GOWN STRL REUS W/TWL XL LVL3 (GOWN DISPOSABLE) ×2
HOOD PEEL AWAY FLYTE STAYCOOL (MISCELLANEOUS) ×6 IMPLANT
HUMERAL BEARING E1 44X36 STD (Miscellaneous) ×2 IMPLANT
KIT RM TURNOVER STRD PROC AR (KITS) ×2 IMPLANT
KIT STABILIZATION SHOULDER (MISCELLANEOUS) ×2 IMPLANT
MASK FACE SPIDER DISP (MASK) ×2 IMPLANT
NDL 18GX1X1/2 (RX/OR ONLY) (NEEDLE) ×1 IMPLANT
NDL HYPO 25X1 1.5 SAFETY (NEEDLE) ×1 IMPLANT
NDL MAYO CATGUT SZ1 (NEEDLE) IMPLANT
NDL MAYO CATGUT SZ4 TPR NDL (NEEDLE) IMPLANT
NDL MAYO CATGUT SZ5 (NEEDLE)
NDL SAFETY 22GX1.5 (NEEDLE) ×2 IMPLANT
NDL SPNL 20GX3.5 QUINCKE YW (NEEDLE) ×1 IMPLANT
NDL SUT 5 .5 CRC TPR PNT MAYO (NEEDLE) IMPLANT
NEEDLE 18GX1X1/2 (RX/OR ONLY) (NEEDLE) ×2 IMPLANT
NEEDLE HYPO 25X1 1.5 SAFETY (NEEDLE) ×2 IMPLANT
NEEDLE MAYO CATGUT SZ1 (NEEDLE) IMPLANT
NEEDLE MAYO CATGUT SZ4 (NEEDLE) IMPLANT
NEEDLE SPNL 20GX3.5 QUINCKE YW (NEEDLE) ×2 IMPLANT
NS IRRIG 1000ML POUR BTL (IV SOLUTION) ×2 IMPLANT
PACK ARTHROSCOPY SHOULDER (MISCELLANEOUS) ×2 IMPLANT
PAD WRAPON POLAR SHDR UNIV (MISCELLANEOUS) ×1 IMPLANT
PIN THREADED REVERSE (PIN) ×1 IMPLANT
PULSAVAC PLUS IRRIG FAN TIP (DISPOSABLE) ×2
SCREW BONE CORT 6.5X35MM (Screw) ×1 IMPLANT
SCREW BONE LOCKING 4.75X30X3.5 (Screw) ×1 IMPLANT
SCREW BONE LOCKING 4.75X35X3.5 (Screw) ×1 IMPLANT
SCREW NON-LOCK 4.75X20X3.5 (Screw) ×2 IMPLANT
SLING ULTRA II M (MISCELLANEOUS) ×2 IMPLANT
SOL .9 NS 3000ML IRR  AL (IV SOLUTION) ×1
SOL .9 NS 3000ML IRR AL (IV SOLUTION) ×1
SOL .9 NS 3000ML IRR UROMATIC (IV SOLUTION) ×1 IMPLANT
SPONGE LAP 18X18 5 PK (GAUZE/BANDAGES/DRESSINGS) ×1 IMPLANT
STAPLER SKIN PROX 35W (STAPLE) ×2 IMPLANT
STEM HUMERAL STRL 9MMX83MM (Stem) ×1 IMPLANT
SUT ETHIBOND 0 MO6 C/R (SUTURE) ×2 IMPLANT
SUT ETHIBOND NAB CT1 #1 30IN (SUTURE) ×2 IMPLANT
SUT FIBERWIRE #2 38 BLUE 1/2 (SUTURE) ×8
SUT VIC AB 0 CT1 36 (SUTURE) ×4 IMPLANT
SUT VIC AB 2-0 CT1 27 (SUTURE) ×6
SUT VIC AB 2-0 CT1 TAPERPNT 27 (SUTURE) ×2 IMPLANT
SUTURE FIBERWR #2 38 BLUE 1/2 (SUTURE) ×4 IMPLANT
SYR 30ML LL (SYRINGE) ×4 IMPLANT
SYRINGE 10CC LL (SYRINGE) ×2 IMPLANT
TIP FAN IRRIG PULSAVAC PLUS (DISPOSABLE) ×1 IMPLANT
TRAY HUM STD 44MM (Orthopedic Implant) ×1 IMPLANT
TUBE CONNECTING 6X3/16 (MISCELLANEOUS) ×2 IMPLANT
WRAPON POLAR PAD SHDR UNIV (MISCELLANEOUS) ×2

## 2017-05-28 NOTE — Transfer of Care (Signed)
Immediate Anesthesia Transfer of Care Note  Patient: Kari Medina  Procedure(s) Performed: Procedure(s): REVERSE SHOULDER ARTHROPLASTY (Right)  Patient Location: PACU  Anesthesia Type:General  Level of Consciousness: awake, alert  and oriented  Airway & Oxygen Therapy: Patient Spontanous Breathing and Patient connected to face mask oxygen  Post-op Assessment: Report given to RN and Post -op Vital signs reviewed and stable  Post vital signs: Reviewed and stable  Last Vitals:  Vitals:   05/28/17 0614 05/28/17 1039  BP: (!) 162/92 (!) 138/99  Pulse: 79 74  Resp: 16 13  Temp: 37.2 C 36.3 C    Last Pain:  Vitals:   05/28/17 0614  TempSrc: Tympanic  PainSc: 0-No pain         Complications: No apparent anesthesia complications

## 2017-05-28 NOTE — Progress Notes (Signed)
PHARMACIST - PHYSICIAN ORDER COMMUNICATION  CONCERNING: P&T Medication Policy on Herbal Medications  DESCRIPTION:  This patient's order for:  Potassium Chloride 99 mg  has been noted.  This product(s) is classified as an "herbal" or natural product. Due to a lack of definitive safety studies or FDA approval, nonstandard manufacturing practices, plus the potential risk of unknown drug-drug interactions while on inpatient medications, the Pharmacy and Therapeutics Committee does not permit the use of "herbal" or natural products of this type within [facility].   ACTION TAKEN: The pharmacy department is unable to verify this order at this time and your patient has been informed of this safety policy. Please reevaluate patient's clinical condition at discharge and address if the herbal or natural product(s) should be resumed at that time.

## 2017-05-28 NOTE — H&P (Signed)
Paper H&P to be scanned into permanent record. H&P reviewed and patient re-examined. No changes. 

## 2017-05-28 NOTE — Anesthesia Post-op Follow-up Note (Cosign Needed)
Anesthesia QCDR form completed.        

## 2017-05-28 NOTE — NC FL2 (Signed)
[place] LEVEL OF CARE SCREENING TOOL     IDENTIFICATION  Patient Name: Kari Medina Birthdate: 1946-09-08 Sex: female Admission Date (Current Location): 05/28/2017  [place] and [place] Number:  Engineering geologist and Address:  [facility], [address], [place], [place] [address]      Provider Number: [id number]  Attending Physician Name and Address:  Corky Mull, MD  Relative Name and Phone Number:       Current Level of Care: Hospital Recommended Level of Care: [place] Prior Approval Number:    Date Approved/Denied:   PASRR Number:  [id number])  Discharge Plan: SNF    Current Diagnoses: Patient Active Problem List   Diagnosis Date Noted  . 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 Blood in underwear 11/18/2016  . Leg cramps 08/15/2016  . BMI 40.0-44.9, adult ([place]) 05/22/2016  . COPD (chronic obstructive pulmonary disease) with emphysema ([place]) 04/16/2016  . Fatigue 04/16/2016  . Hypertension   . Tremor, essential   . OSA (obstructive sleep apnea)   . RLS (restless legs syndrome)   . Raynaud disease   . Generalized osteoarthritis of multiple sites     Orientation RESPIRATION BLADDER Height & Weight     Self, Time, Situation, Place  Normal Continent Weight:   Height:     BEHAVIORAL SYMPTOMS/MOOD NEUROLOGICAL BOWEL NUTRITION STATUS   (none)  (none) Continent Diet (Diet: Heart Healthy )  AMBULATORY STATUS COMMUNICATION OF NEEDS Skin   Limited Assist Verbally Surgical wounds (Incision: Right Shoulder )                       Personal Care Assistance Level of Assistance  Bathing, Feeding, Dressing Bathing Assistance: Limited assistance Feeding assistance: Independent Dressing Assistance: Limited assistance     Functional Limitations Info  Sight, Hearing, Speech Sight Info: Adequate Hearing Info: Adequate Speech Info: Adequate    SPECIAL CARE FACTORS  FREQUENCY  PT (By licensed PT), OT (By licensed OT)     PT Frequency:  (5) OT Frequency:  (5)            Contractures      Additional Factors Info  Code Status, Allergies Code Status Info:  (Full Code. ) Allergies Info:  (Duloxetine, Latex, Nickel, Pramipexole, Prednisone, Topiramate, Citalopram, Paroxetine Hcl, Sertraline)           Current Medications (05/28/2017):  This is the current hospital active medication list Current Facility-Administered Medications  Medication Dose Route Frequency Provider Last Rate Last Dose  . acetaminophen (TYLENOL) tablet 650 mg  650 mg Oral Q6H PRN Poggi, Marshall Cork, MD       Or  . acetaminophen (TYLENOL) suppository 650 mg  650 mg Rectal Q6H PRN Poggi, Marshall Cork, MD      . acetaminophen (TYLENOL) tablet 1,000 mg  1,000 mg Oral Q6H Poggi, Marshall Cork, MD      . acidophilus (RISAQUAD) capsule 1 capsule  1 capsule Oral Daily Poggi, Marshall Cork, MD      . albuterol (PROVENTIL) (2.5 MG/3ML) 0.083% nebulizer solution 2.5 mg  2.5 mg Nebulization Q6H PRN Poggi, Marshall Cork, MD      . aspirin EC tablet 81 mg  81 mg Oral Daily Poggi, Marshall Cork, MD      . bisacodyl (DULCOLAX) suppository 10 mg  10 mg Rectal Daily PRN Poggi, Marshall Cork, MD      . Derrill Memo ON 05/29/2017] calcium carbonate (TUMS -  dosed in mg elemental calcium) chewable tablet 600 mg of elemental calcium  3 tablet Oral Q breakfast Poggi, Marshall Cork, MD      . ceFAZolin (ANCEF) 3 g in dextrose 5 % 50 mL IVPB  3 g Intravenous Q6H Poggi, Marshall Cork, MD 130 mL/hr at 05/28/17 1319 3 g at 05/28/17 1319  . cholecalciferol (VITAMIN D) tablet 2,000 Units  2,000 Units Oral BID Poggi, Marshall Cork, MD      . dextrose 5 % and 0.9 % NaCl with KCl 20 mEq/L infusion   Intravenous Continuous Poggi, Marshall Cork, MD 100 mL/hr at 05/28/17 1320    . diphenhydrAMINE (BENADRYL) 12.5 MG/5ML elixir 12.5-25 mg  12.5-25 mg Oral Q4H PRN Poggi, Marshall Cork, MD      . docusate sodium (COLACE) capsule 100 mg  100 mg Oral BID Poggi, Marshall Cork, MD   100 mg at 05/28/17 1252  . [START  ON 05/29/2017] enoxaparin (LOVENOX) injection 40 mg  40 mg Subcutaneous Q24H Poggi, Marshall Cork, MD      . fentaNYL (SUBLIMAZE) 100 MCG/2ML injection           . fentaNYL (SUBLIMAZE) 100 MCG/2ML injection           . fluticasone (FLONASE) 50 MCG/ACT nasal spray 1 spray  1 spray Each Nare Daily PRN Poggi, Marshall Cork, MD      . fluticasone furoate-vilanterol (BREO ELLIPTA) 100-25 MCG/INH 1 puff  1 puff Inhalation QHS Poggi, Marshall Cork, MD      . hydrochlorothiazide (HYDRODIURIL) tablet 12.5 mg  12.5 mg Oral Daily Poggi, Marshall Cork, MD      . HYDROmorphone (DILAUDID) 1 MG/ML injection           . HYDROmorphone (DILAUDID) 1 MG/ML injection           . HYDROmorphone (DILAUDID) injection 1-2 mg  1-2 mg Intravenous Q2H PRN Poggi, Marshall Cork, MD      . ketorolac (TORADOL) 15 MG/ML injection 15 mg  15 mg Intravenous Q6H Poggi, Marshall Cork, MD   15 mg at 05/28/17 1254  . ketorolac (TORADOL) 30 MG/ML injection           . [START ON 05/29/2017] levothyroxine (SYNTHROID, LEVOTHROID) tablet 200 mcg  200 mcg Oral QAC breakfast Poggi, Marshall Cork, MD      . loratadine (CLARITIN) tablet 10 mg  10 mg Oral Daily Poggi, Marshall Cork, MD      . magnesium hydroxide (MILK OF MAGNESIA) suspension 30 mL  30 mL Oral Daily PRN Poggi, Marshall Cork, MD      . magnesium oxide (MAG-OX) tablet 400 mg  400 mg Oral BID Poggi, Marshall Cork, MD      . metoCLOPramide (REGLAN) tablet 5-10 mg  5-10 mg Oral Q8H PRN Poggi, Marshall Cork, MD       Or  . metoCLOPramide (REGLAN) injection 5-10 mg  5-10 mg Intravenous Q8H PRN Poggi, Marshall Cork, MD      . montelukast (SINGULAIR) tablet 10 mg  10 mg Oral QHS Poggi, Marshall Cork, MD      . multivitamin (RENA-VIT) tablet 1 tablet  1 tablet Oral Daily Poggi, Marshall Cork, MD      . ondansetron (ZOFRAN) tablet 4 mg  4 mg Oral Q6H PRN Poggi, Marshall Cork, MD       Or  . ondansetron (ZOFRAN) injection 4 mg  4 mg Intravenous Q6H PRN Poggi, Marshall Cork, MD      . oxyCODONE (Oxy IR/ROXICODONE) immediate release  tablet 5-10 mg  5-10 mg Oral Q3H PRN Poggi, Marshall Cork, MD      .  pantoprazole (PROTONIX) EC tablet 40 mg  40 mg Oral QAC breakfast Poggi, Marshall Cork, MD   40 mg at 05/28/17 1252  . propranolol (INDERAL) tablet 40 mg  40 mg Oral BID Poggi, Marshall Cork, MD      . rOPINIRole (REQUIP) tablet 2 mg  2 mg Oral QHS Poggi, Marshall Cork, MD      . sodium phosphate (FLEET) 7-19 GM/118ML enema 1 enema  1 enema Rectal Once PRN Poggi, Marshall Cork, MD      . vitamin C (ASCORBIC ACID) tablet 500 mg  500 mg Oral Daily Poggi, Marshall Cork, MD         Discharge Medications: Please see discharge summary for a list of discharge medications.  Relevant Imaging Results:  Relevant Lab Results:   Additional Information  (SSN: [id number])  Keyairra Kolinski, Veronia Beets, LCSW

## 2017-05-28 NOTE — Anesthesia Procedure Notes (Signed)
Procedure Name: Intubation Date/Time: 05/28/2017 7:43 AM Performed by: Jonna Clark Pre-anesthesia Checklist: Patient identified, Patient being monitored, Timeout performed, Emergency Drugs available and Suction available Patient Re-evaluated:Patient Re-evaluated prior to inductionOxygen Delivery Method: Circle system utilized Preoxygenation: Pre-oxygenation with 100% oxygen Intubation Type: IV induction Ventilation: Mask ventilation without difficulty Laryngoscope Size: 3 and McGraph Grade View: Grade II Tube type: Oral Tube size: 7.0 mm Number of attempts: 1 Airway Equipment and Method: Stylet Placement Confirmation: ETT inserted through vocal cords under direct vision,  positive ETCO2 and breath sounds checked- equal and bilateral Secured at: 22 cm Tube secured with: Tape Dental Injury: Teeth and Oropharynx as per pre-operative assessment  Difficulty Due To: Difficulty was anticipated and Difficult Airway- due to anterior larynx Future Recommendations: Recommend- induction with short-acting agent, and alternative techniques readily available

## 2017-05-28 NOTE — Anesthesia Preprocedure Evaluation (Signed)
Anesthesia Evaluation  Patient identified by MRN, date of birth, ID band Patient awake    Reviewed: Allergy & Precautions, H&P , NPO status , Patient's Chart, lab work & pertinent test results, reviewed documented beta blocker date and time   Airway Mallampati: III  TM Distance: >3 FB Neck ROM: full    Dental  (+) Teeth Intact, Partial Upper   Pulmonary neg pulmonary ROS, shortness of breath and with exertion, asthma , sleep apnea , COPD, former smoker,    Pulmonary exam normal        Cardiovascular Exercise Tolerance: Poor hypertension, + Peripheral Vascular Disease  negative cardio ROS Normal cardiovascular exam+ dysrhythmias  Rhythm:regular Rate:Normal     Neuro/Psych Anxiety Depression negative neurological ROS  negative psych ROS   GI/Hepatic negative GI ROS, Neg liver ROS, hiatal hernia,   Endo/Other  negative endocrine ROSHypothyroidism   Renal/GU negative Renal ROS  negative genitourinary   Musculoskeletal  (+) Arthritis , Osteoarthritis,  Fibromyalgia -  Abdominal   Peds  Hematology negative hematology ROS (+)   Anesthesia Other Findings Past Medical History: No date: Asthma No date: Depression No date: Dyspnea     Comment: doe No date: Dysrhythmia     Comment: extra beat No date: Fatty liver No date: Fibromyalgia No date: Generalized osteoarthritis of multiple sites No date: History of hiatal hernia No date: Hypertension No date: Hypothyroidism No date: OSA (obstructive sleep apnea) No date: Pain     Comment: chronic ruq and back pain No date: Panic attacks No date: Raynaud disease No date: Raynaud disease No date: RLS (restless legs syndrome) No date: Spleen absent     Comment: TOLD ABSENT THEN TOLD DOES HAVE SPLEEN.               PATIENT IS UNCERTAIN No date: Tremor, essential Past Surgical History: No date: CHOLECYSTECTOMY No date: JOINT REPLACEMENT Bilateral     Comment:  2008/2011 1972: RHINOPLASTY 2006: THYROIDECTOMY No date: TOTAL KNEE ARTHROPLASTY     Comment: bilateral   Reproductive/Obstetrics negative OB ROS                             Anesthesia Physical Anesthesia Plan  ASA: III  Anesthesia Plan: General ETT   Post-op Pain Management:    Induction:   PONV Risk Score and Plan: 4 or greater and Ondansetron, Dexamethasone, Propofol, Midazolam, Scopolamine patch - Pre-op and Treatment may vary due to age  Airway Management Planned:   Additional Equipment:   Intra-op Plan:   Post-operative Plan:   Informed Consent: I have reviewed the patients History and Physical, chart, labs and discussed the procedure including the risks, benefits and alternatives for the proposed anesthesia with the patient or authorized representative who has indicated his/her understanding and acceptance.   Dental Advisory Given  Plan Discussed with: CRNA  Anesthesia Plan Comments:         Anesthesia Quick Evaluation

## 2017-05-28 NOTE — Progress Notes (Signed)
PHARMACIST - PHYSICIAN ORDER COMMUNICATION  CONCERNING: P&T Medication Policy on Herbal Medications  DESCRIPTION:  This patient's order for:  Tumeric  has been noted.  This product(s) is classified as an "herbal" or natural product. Due to a lack of definitive safety studies or FDA approval, nonstandard manufacturing practices, plus the potential risk of unknown drug-drug interactions while on inpatient medications, the Pharmacy and Therapeutics Committee does not permit the use of "herbal" or natural products of this type within [place].   ACTION TAKEN: The pharmacy department is unable to verify this order at this time and your patient has been informed of this safety policy. Please reevaluate patient's clinical condition at discharge and address if the herbal or natural product(s) should be resumed at that time.   

## 2017-05-28 NOTE — Op Note (Signed)
05/28/2017  10:23 AM  Patient:   Kari Medina  Pre-Op Diagnosis:   Advanced degenerative joint disease with rotator cuff tear, right shoulder.  Post-Op Diagnosis:   Same.  Procedure:   Reverse right total shoulder arthroplasty.  Surgeon:   Pascal Lux, MD  Assistant:   Cameron Proud, PA-C  Anesthesia:   General endotracheal with an interscalene block placed preoperatively by the anesthesiologist.  Findings:   As above.  Complications:   None  EBL:  200 cc  Fluids:   500 cc crystalloid  UOP:  500 cc  TT:   None  Drains:   None  Closure:   Staples  Implants:   All press-fit Biomet Comprehensive system with a #9 mini-humeral stem, a 44 mm humeral tray with a standard insert, and a mini-base plate with a 36 mm glenosphere.  Brief Clinical Note:   The patient is a 71 year old female with a long history of progressively worsening right shoulder pain. Her symptoms have progressed despite medications, activity modification, etc. Her history and examination consistent with progressive degenerative joint disease. An MRI scan demonstrated the presence of a full-thickness rotator cuff tear involving supraspinatus tendon. The patient presents at this time for a reverse right total shoulder arthroplasty.  Procedure:   The patient underwent placement of an interscalene block by the anesthesiologist in the preoperative holding area before being brought into the operating room and lain in the supine position. The patient then underwent general endotracheal intubation and anesthesia before a Foley catheter was inserted and the patient repositioned in the beach chair position using the beach chair positioner. The right shoulder and upper extremity were prepped with ChloraPrep solution before being draped sterilely. Preoperative antibiotics were administered. A standard anterior approach to the shoulder was made through an approximately 4-5 inch incision. The incision was carried down  through the subcutaneous tissues to expose the deltopectoral fascia. The interval between the deltoid and pectoralis muscles was identified and this plane developed, retracting the cephalic vein laterally with the deltoid muscle. The conjoined tendon was identified. Its lateral margin was dissected and the Kolbel self-retraining retractor inserted. The "three sisters" were identified and cauterized. Bursal tissues were removed to improve visualization. The subscapularis tendon was released from its attachment to the lesser tuberosity 1 cm proximal to its insertion and several tagging sutures placed. The inferior capsule was released with care after identifying and protecting the axillary nerve. The proximal humeral cut was made at approximately 30 of retroversion using the extra-medullary guide.   Attention was redirected to the glenoid. The labrum was debrided circumferentially before the center of the glenoid was marked with electrocautery. The guidewire was drilled into the glenoid neck using the appropriate guide. After verifying its position, it was overreamed with the mini-baseplate reamer to create a flat surface. The permanent mini-baseplate was impacted into place. It was stabilized with a 35 x 6.5 mm central screw and four peripheral screws. Locking screws were placed superiorly and inferiorly while nonlocking screws were placed anteriorly and posteriorly. The permanent 36 mm glenosphere was then impacted into place and its Morse taper locking mechanism verified using manual distraction.  Attention was directed to the humeral side. The humeral canal was reamed sequentially beginning with the end-cutting reamer then progressing from a 4 mm reamer up to a 9 mm reamer. This provided excellent circumferential chatter. The canal was broached beginning with a #7 broach and progressing to a #9 broach. This was left in place and a trial reduction  performed using the standard trial humeral platform. The arm  demonstrated excellent range of motion as the hand could be brought across the chest to the opposite shoulder and brought to the top of the patient's head and to the patient's ear. The shoulder appeared stable throughout this range of motion. The joint was dislocated and the trial components removed. The permanent #9 mini-stem was impacted into place with care taken to maintain the appropriate version. The permanent 44 mm humeral platform with the standard insert was put together on the back table and impacted into place. Again, the [facility] taper locking mechanism was verified using manual distraction. The shoulder was relocated using two finger pressure and again placed through a range of motion with the findings as described above.  The wound was copiously irrigated with bacitracin saline solution using the jet lavage system before a total of 20 cc of Exparel diluted out to 60 cc with normal saline and 30 cc of 0.5% Sensorcaine with epinephrine was injected into the pericapsular and peri-incisional tissues to help with postoperative analgesia. The subscapularis tendon was reapproximated using #2 FiberWire interrupted sutures. The deltopectoral interval was closed using #0 Vicryl interrupted sutures before the subcutaneous tissues were closed using 2-0 Vicryl interrupted sutures. The skin was closed using staples. Prior to closing the skin, 1 g of transexemic acid in 10 cc of normal saline was injected intra-articularly to help with postoperative bleeding. A sterile occlusive dressing was applied to the wound before the arm was placed into a shoulder immobilizer with an abduction pillow. A Polar Care system also was applied to the shoulder. The patient was then transferred back to a hospital bed before being awakened, extubated, and returned to the recovery room in satisfactory condition after tolerating the procedure well.

## 2017-05-28 NOTE — Care Management Note (Signed)
Case Management Note  Patient Details  Name: Kari Medina MRN: [id number] Date of Birth: [name] 15, 1946  Subjective/Objective:        Right Shoulder Arthroplasty.             Action/Plan:  Lives with husband, Herbie [place] [phone] or [phone]). Last seen Dr. Silvio Pate November 2017. Prescriptions are filled at [facility]. Knee replacements x 2 in the past. [facility] No equipment in the home except CPAP since 2007 per  [facility]. Takes care of all basic activities of daily living herself. Drives.   Expected Discharge Date:                  Expected Discharge Plan:     In-House Referral:   yes  Discharge planning Services     Post Acute Care Choice:   [facility] Choice offered to:   yes  DME Arranged:   No  DME Agency:   no preference  HH Arranged:   No  HH Agency:   [facility]  Status of Service:   Pending evaluations  If discussed at [employer], dates discussed:    Additional Comments:  Shelbie Ammons, RN MSN CCM Care Management [phone] 05/28/2017, 2:46 PM

## 2017-05-28 NOTE — Evaluation (Signed)
Physical Therapy Evaluation Patient Details Name: Kari Medina MRN: [id number] DOB: Apr 21, 1946 Today's Date: 05/28/2017   History of Present Illness  Pt underwent R reverse total shoulder arthroplasty and is POD#0 at time of PT evaluation. No reported post-op complications. Pt is eager to get out of bed  Clinical Impression  Pt admitted with above diagnosis. Pt currently with functional limitations due to the deficits listed below (see PT Problem List).  Pt does very well with therapy. She is modified independent for bed mobility with HOB elevated and use of rails. She requires supervision for transfers and CGA with single hand held assist in LUE for transfers and ambulation from bed to recliner. Pt is steady and stable on her feet. Pt educated and performed R elbow, wrist, and finger AROM flexion/extension. Shoulder maintained in sling throughout evaluation. Pt and husband educated about management of sling and polar care unit. Pt will need to perform stair training tomorrow AM as well as ambulate in halls and will be ready for discharge. She should be appropriate to return home with husband and [facility] PT when medically stable after further ambulation and stairs. Pt will benefit from skilled PT services to address deficits in strength, balance, and mobility in order to return to full function at home.    Follow Up Recommendations Home health PT    Equipment Recommendations  None recommended by PT    Recommendations for Other Services       Precautions / Restrictions Precautions Precautions: Shoulder Type of Shoulder Precautions: No AROM/PROM of R shoulder. AROM flex/ext R elbow, wrist, and fingers. Shoulder Interventions: Shoulder sling/immobilizer Precaution Comments: Pt educated about R shoulder lifting restrictions Required Braces or Orthoses: Sling Restrictions Weight Bearing Restrictions: Yes RUE Weight Bearing: Non weight bearing      Mobility  Bed Mobility Overal bed  mobility: Modified Independent             General bed mobility comments: Use of bed rails and HOB elevated  Transfers Overall transfer level: Needs assistance Equipment used: None Transfers: Sit to/from Stand Sit to Stand: Supervision         General transfer comment: Good speed and sequencing noted. No UE support required for sit to stand transfers. Once upright pt is steady in standing  Ambulation/Gait Ambulation/Gait assistance: Min guard Ambulation Distance (Feet): 5 Feet Assistive device: 1 person hand held assist Gait Pattern/deviations: Decreased step length - right;Decreased step length - left Gait velocity: Decreased but WFL for household mobility Gait velocity interpretation: <1.8 ft/sec, indicative of risk for recurrent falls General Gait Details: Pt able to ambulate from bed to recliner with single hand held assist for LUE. Good stability noted without need to assist with balance. Pt ambulated on room air. SaO2 at 90% at end of ambulation and does not increase with rest. Pt placed back on 2L/min supplemental O2  Stairs            Wheelchair Mobility    Modified Rankin (Stroke Patients Only)       Balance Overall balance assessment: Needs assistance Sitting-balance support: No upper extremity supported Sitting balance-Leahy Scale: Good     Standing balance support: No upper extremity supported Standing balance-Leahy Scale: Fair                               Pertinent Vitals/Pain Pain Assessment: 0-10 Pain Score: 3  Pain Location: L shoulder Pain Descriptors / Indicators: Pressure Pain Intervention(s):  Limited activity within patient's tolerance;Repositioned    Home Living Family/patient expects to be discharged to:: Private residence Living Arrangements: Spouse/significant other Available Help at Discharge: Family Type of Home: House Home Access: Stairs to enter Entrance Stairs-Rails: Psychiatric nurse of  Steps: 6 Home Layout: One level Home Equipment: Toilet riser;Walker - 2 wheels;Cane - single point;Bedside commode;Shower seat;Grab bars - tub/shower      Prior Function Level of Independence: Needs assistance   Gait / Transfers Assistance Needed: Independent without assistive device. No falls. Full community mobility  ADL's / Homemaking Assistance Needed: Independent with ADLs, assist with IADLs from husband        Hand Dominance   Dominant Hand: Right    Extremity/Trunk Assessment   Upper Extremity Assessment Upper Extremity Assessment: RUE deficits/detail RUE Deficits / Details: R shoulder remains in sling. Full AROM R elbow flexion and extension, unweighted. Full RUE sensation, intact R wrist ext/flex    Lower Extremity Assessment Lower Extremity Assessment: Overall WFL for tasks assessed       Communication   Communication: No difficulties  Cognition Arousal/Alertness: Awake/alert Behavior During Therapy: WFL for tasks assessed/performed Overall Cognitive Status: Within Functional Limits for tasks assessed                                        General Comments      Exercises General Exercises - Upper Extremity Elbow Flexion: AROM;Right;5 reps;Standing Elbow Extension: AROM;Right;5 reps;Standing Wrist Flexion: AROM;5 reps;10 reps;Standing Wrist Extension: AROM;Right;10 reps;Standing Digit Composite Flexion: AROM;Right;5 reps;Squeeze ball   Assessment/Plan    PT Assessment Patient needs continued PT services  PT Problem List Decreased strength;Decreased range of motion;Pain       PT Treatment Interventions DME instruction;Gait training;Stair training;Functional mobility training;Therapeutic activities;Therapeutic exercise;Balance training;Neuromuscular re-education;Patient/family education;Manual techniques    PT Goals (Current goals can be found in the Care Plan section)  Acute Rehab PT Goals Patient Stated Goal: Return to prior  function at home PT Goal Formulation: With patient/family Time For Goal Achievement: 06/11/17 Potential to Achieve Goals: Good    Frequency BID   Barriers to discharge        Co-evaluation               AM-PAC PT "6 Clicks" Daily Activity  Outcome Measure Difficulty turning over in bed (including adjusting bedclothes, sheets and blankets)?: A Little Difficulty moving from lying on back to sitting on the side of the bed? : A Little Difficulty sitting down on and standing up from a chair with arms (e.g., wheelchair, bedside commode, etc,.)?: A Little Help needed moving to and from a bed to chair (including a wheelchair)?: A Little Help needed walking in hospital room?: A Little Help needed climbing 3-5 steps with a railing? : A Little 6 Click Score: 18    End of Session Equipment Utilized During Treatment: Gait belt Activity Tolerance: Patient tolerated treatment well Patient left: in chair;with call bell/phone within reach;with chair alarm set   PT Visit Diagnosis: Muscle weakness (generalized) (M62.81);Pain Pain - Right/Left: Right Pain - part of body: Shoulder    Time: 7026-3785 PT Time Calculation (min) (ACUTE ONLY): 27 min   Charges:   PT Evaluation $PT Eval Low Complexity: 1 Procedure PT Treatments $Therapeutic Exercise: 8-22 mins   PT G Codes:   PT G-Codes **NOT FOR INPATIENT CLASS** Functional Assessment Tool Used: AM-PAC 6 Clicks Basic Mobility Functional Limitation:  Mobility: Walking and moving around Mobility: Walking and Moving Around Current Status [phone]): At least 40 percent but less than 60 percent impaired, limited or restricted Mobility: Walking and Moving Around Goal Status [phone]): At least 1 percent but less than 20 percent impaired, limited or restricted   Phillips Grout PT, DPT    Kari Medina 05/28/2017, 3:39 PM

## 2017-05-29 LAB — CBC WITH DIFFERENTIAL/PLATELET
Basophils Absolute: 0 10*3/uL (ref 0–0.1)
Basophils Relative: 0 %
Eosinophils Absolute: 0 10*3/uL (ref 0–0.7)
Eosinophils Relative: 0 %
HCT: 43.3 % (ref 35.0–47.0)
Hemoglobin: 14.5 g/dL (ref 12.0–16.0)
Lymphocytes Relative: 9 %
Lymphs Abs: 2.1 10*3/uL (ref 1.0–3.6)
MCH: 30.9 pg (ref 26.0–34.0)
MCHC: 33.5 g/dL (ref 32.0–36.0)
MCV: 92.5 fL (ref 80.0–100.0)
Monocytes Absolute: 2.7 10*3/uL — ABNORMAL HIGH (ref 0.2–0.9)
Monocytes Relative: 12 %
Neutro Abs: 18.1 10*3/uL — ABNORMAL HIGH (ref 1.4–6.5)
Neutrophils Relative %: 79 %
Platelets: 240 10*3/uL (ref 150–440)
RBC: 4.69 MIL/uL (ref 3.80–5.20)
RDW: 16.2 % — ABNORMAL HIGH (ref 11.5–14.5)
WBC: 22.9 10*3/uL — ABNORMAL HIGH (ref 3.6–11.0)

## 2017-05-29 LAB — BASIC METABOLIC PANEL
Anion gap: 10 (ref 5–15)
BUN: 17 mg/dL (ref 6–20)
CO2: 24 mmol/L (ref 22–32)
Calcium: 8.9 mg/dL (ref 8.9–10.3)
Chloride: 105 mmol/L (ref 101–111)
Creatinine, Ser: 0.93 mg/dL (ref 0.44–1.00)
GFR calc Af Amer: >60 mL/min (ref >60)
GFR calc non Af Amer: >60 mL/min (ref >60)
Glucose, Bld: 186 mg/dL — ABNORMAL HIGH (ref 65–99)
Potassium: 4.6 mmol/L (ref 3.5–5.1)
Sodium: 139 mmol/L (ref 135–145)

## 2017-05-29 MED ORDER — OXYCODONE HCL 5 MG PO TABS: 5.0000 mg | ORAL_TABLET | ORAL | 0 refills | Status: DC | PRN

## 2017-05-29 NOTE — Care Management (Signed)
Patient discharging to home today followed by [facility] at home arranged by previous RNCM. I have notified Tim with [facility] of patient discharge. I have confirmed discharge with patient's husband. No other RNCM needs.

## 2017-05-29 NOTE — Anesthesia Postprocedure Evaluation (Signed)
Anesthesia Post Note  Patient: Kari Medina  Procedure(s) Performed: Procedure(s) (LRB): REVERSE SHOULDER ARTHROPLASTY (Right)  Patient location during evaluation: PACU Anesthesia Type: General Level of consciousness: awake and alert Pain management: pain level controlled Vital Signs Assessment: post-procedure vital signs reviewed and stable Respiratory status: spontaneous breathing, nonlabored ventilation, respiratory function stable and patient connected to nasal cannula oxygen Cardiovascular status: blood pressure returned to baseline and stable Postop Assessment: no signs of nausea or vomiting Anesthetic complications: no     Last Vitals:  Vitals:   05/28/17 1920 05/29/17 0726  BP: 135/70 (!) 119/56  Pulse: (!) 58 (!) 52  Resp:    Temp:  37.2 C    Last Pain:  Vitals:   05/29/17 0726  TempSrc: Oral  PainSc: 2                  Molli Barrows

## 2017-05-29 NOTE — Progress Notes (Signed)
  Subjective: 1 Day Post-Op Procedure(s) (LRB): REVERSE SHOULDER ARTHROPLASTY (Right) Patient reports pain as mild.   Patient is well, and has had no acute complaints or problems Plan is to go Home after hospital stay. Negative for chest pain and shortness of breath Fever: no Gastrointestinal:Negative for nausea and vomiting  Objective: Vital signs in last 24 hours: Temp:  [97.3 F (36.3 C)-99 F (37.2 C)] 99 F (37.2 C) (06/08 0726) Pulse Rate:  [52-74] 52 (06/08 0726) Resp:  [13-22] 19 (06/07 1327) BP: (115-156)/(56-99) 119/56 (06/08 0726) SpO2:  [88 %-97 %] 95 % (06/08 0726)  Intake/Output from previous day:  Intake/Output Summary (Last 24 hours) at 05/29/17 0750 Last data filed at 05/29/17 0600  Gross per 24 hour  Intake             2410 ml  Output             3800 ml  Net            -1390 ml    Intake/Output this shift: No intake/output data recorded.  Labs: No results for input(s): HGB in the last 72 hours. No results for input(s): WBC, RBC, HCT, PLT in the last 72 hours. No results for input(s): NA, K, CL, CO2, BUN, CREATININE, GLUCOSE, CALCIUM in the last 72 hours. No results for input(s): LABPT, INR in the last 72 hours.   EXAM General - Patient is Alert, Appropriate and Oriented Extremity - Sensation intact distally Intact pulses distally Dorsiflexion/Plantar flexion intact Incision: dressing C/D/I No cellulitis present  Patient is intact to light touch to the right upper extremity. Dressing/Incision - clean, dry, no drainage Motor Function - intact, moving foot and toes well on exam.  Abdomen soft to palpation, without tympany.  Normal BS.  Past Medical History:  Diagnosis Date  . Asthma   . Depression   . Dyspnea    doe  . Dysrhythmia    extra beat  . Fatty liver   . Fibromyalgia   . Generalized osteoarthritis of multiple sites   . History of hiatal hernia   . Hypertension   . Hypothyroidism   . OSA (obstructive sleep apnea)   . Pain    chronic ruq and back pain  . Panic attacks   . Raynaud disease   . Raynaud disease   . RLS (restless legs syndrome)   . 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 PATIENT IS UNCERTAIN  . Tremor, essential     Assessment/Plan: 1 Day Post-Op Procedure(s) (LRB): REVERSE SHOULDER ARTHROPLASTY (Right) Active Problems:   Status post reverse total shoulder replacement, right  Estimated body mass index is 44.04 kg/m as calculated from the following:   Height as of 05/21/17: 5' 6.5" (1.689 m).   Weight as of 05/21/17: 125.6 kg (277 lb). Advance diet Up with therapy D/C IV fluids when tolerating po intake.  Labs have not returned yet for review, will check later. PT recommending HHPT. Pt has not urinated yet without foley.  Passing gas without difficulty. Plan will be for discharge home this afternoon.  DVT Prophylaxis - Lovenox, Foot Pumps and TED hose Non-weightbearing to right arm.  Kari James, PA-C [facility] 05/29/2017, 7:50 AM

## 2017-05-29 NOTE — Progress Notes (Signed)
Clinical Social Worker (CSW) received SNF consult. PT is recommending home health. RN case manager aware of above. Please reconsult if future social work needs arise. CSW signing off.   Shirlyn Savin, LCSW [phone] 

## 2017-05-29 NOTE — Evaluation (Signed)
Occupational Therapy Evaluation Patient Details Name: Kari Medina MRN: [id number] DOB: [birth year] Today's Date: 05/29/2017    History of Present Illness Pt. Is a 71 y.o. Female who was admitted to [facility] for a right Reverse Total Shoulder aArthroplasty. Pt. PMHx incldes: Asthma, Depression, Dyspnea, Raynaud's Disease, Fibromyalgia, Hiatal Hernia, HTN, Hypothyroidism   Clinical Impression   Pt. Is a 71 y.o. female who was admitted to [facility] for a Right Reverse Total Shoulder Arthroplasty. Pt. education was provided about A/E use for LE ADLs, sling care, application, and one-armed dressing techniques.  Pt. Could benefit from OT services for ADL training, A/E training, energy conservation/work simplification techniques, and pt. education about home modification, and DME. Pt. Plans to return home with husband assist upon discharge.    Follow Up Recommendations  No OT follow up    Equipment Recommendations       Recommendations for Other Services       Precautions / Restrictions Precautions Precautions: Shoulder Type of Shoulder Precautions: No AROM/PROM of R shoulder. AROM flex/ext R elbow, wrist, and fingers. Shoulder Interventions: Shoulder sling/immobilizer Precaution Comments: adjusted shld sling as it had shifted signficantly and needed tightened/rotated Restrictions Weight Bearing Restrictions: Yes RUE Weight Bearing: Non weight bearing         Balance Overall balance assessment: Modified Independent                                         ADL either performed or assessed with clinical judgement   ADL Overall ADL's : Needs assistance/impaired Eating/Feeding: Set up       Upper Body Bathing: Moderate assistance   Lower Body Bathing: Moderate assistance   Upper Body Dressing : Moderate assistance   Lower Body Dressing: Minimal assistance               Functional mobility during ADLs: Supervision/safety General ADL Comments: Pt.  education was provided about A/E use for LE ADLs, one-armed dressing techniques for UE dressing, and sling application, and care.     Vision Baseline Vision/History: Wears glasses Patient Visual Report: No change from baseline       Perception     Praxis      Pertinent Vitals/Pain Pain Assessment: No/denies pain Pain Score: 3      Hand Dominance Right   Extremity/Trunk Assessment Upper Extremity Assessment Upper Extremity Assessment: RUE deficits/detail RUE: Unable to fully assess due to immobilization           Communication Communication Communication: No difficulties   Cognition Arousal/Alertness: Awake/alert Behavior During Therapy: WFL for tasks assessed/performed Overall Cognitive Status: Within Functional Limits for tasks assessed                                     General Comments       Exercises     Shoulder Instructions      Home Living Family/patient expects to be discharged to:: Private residence Living Arrangements: Spouse/significant other Available Help at Discharge: Family Type of Home: House Home Access: Stairs to enter Technical brewer of Steps: 6 Entrance Stairs-Rails: Left;Right Home Layout: One level     Bathroom Shower/Tub: Teacher, early years/pre: Standard Bathroom Accessibility: Yes   Home Equipment: Toilet riser;Walker - 2 wheels;Cane - single point;Bedside commode;Shower seat;Grab bars - tub/shower  Prior Functioning/Environment      ADL's / Homemaking Assistance Needed: Independent with ADLs, assist with IADLs from husband, driving            OT Problem List: Decreased strength;Decreased range of motion      OT Treatment/Interventions: Self-care/ADL training;Therapeutic exercise;Therapeutic activities;Patient/family education;DME and/or AE instruction;Energy conservation    OT Goals(Current goals can be found in the care plan section) Acute Rehab OT Goals Patient Stated  Goal: Return to prior function at home OT Goal Formulation: With patient Potential to Achieve Goals: Good  OT Frequency: Min 1X/week   Barriers to D/C:            Co-evaluation              AM-PAC PT "6 Clicks" Daily Activity     Outcome Measure Help from another person eating meals?: None Help from another person taking care of personal grooming?: A Little Help from another person toileting, which includes using toliet, bedpan, or urinal?: A Lot Help from another person bathing (including washing, rinsing, drying)?: A Little Help from another person to put on and taking off regular upper body clothing?: A Lot Help from another person to put on and taking off regular lower body clothing?: A Little 6 Click Score: 17   End of Session    Activity Tolerance: Patient tolerated treatment well Patient left: in bed  OT Visit Diagnosis: Muscle weakness (generalized) (M62.81)                Time: 9150-5697 OT Time Calculation (min): 26 min Charges:  OT General Charges $OT Visit: 1 Procedure OT Evaluation $OT Eval Moderate Complexity: 1 Procedure G-Codes:     Harrel Carina, MS, OTR/L Harrel Carina, MS, OTR/L 05/29/2017, 11:47 AM

## 2017-05-29 NOTE — Progress Notes (Signed)
Pt on home CPAP with full face mask. O2 Sat 96% HR 98. No distress noted.

## 2017-05-29 NOTE — Discharge Instructions (Signed)
Diet: As you were doing prior to hospitalization   Shower:  May shower but keep the wounds dry, use an occlusive plastic wrap, NO SOAKING IN TUB.  If the bandage gets wet, change with a clean dry gauze.  Dressing:  You may change your dressing as needed. Change the dressing with sterile gauze dressing.    Activity:  Increase activity slowly as tolerated, but follow the weight bearing instructions below.  No lifting or driving for 6 weeks.  Weight Bearing:   Non-weightbearing to the right arm.  Blood Clot Prevention:  Take 81mg  aspirin twice daily until next follow-up.  To prevent constipation: you may use a stool softener such as -  Colace (over the counter) 100 mg by mouth twice a day  Drink plenty of fluids (prune juice may be helpful) and high fiber foods Miralax (over the counter) for constipation as needed.    Itching:  If you experience itching with your medications, try taking only a single pain pill, or even half a pain pill at a time.  You may take up to 10 pain pills per day, and you can also use benadryl over the counter for itching or also to help with sleep.   Precautions:  If you experience chest pain or shortness of breath - call 911 immediately for transfer to the hospital emergency department!!  If you develop a fever greater that 101 F, purulent drainage from wound, increased redness or drainage from wound, or calf pain-Call [facility]                                              Follow- Up Appointment:  Please call for an appointment to be seen in 2 weeks at [facility]

## 2017-05-29 NOTE — Discharge Summary (Signed)
Physician Discharge Summary  Patient ID: Kari Medina MRN: [id number] DOB/AGE: 06-29-1946 71 y.o.  Admit date: 05/28/2017 Discharge date: 05/29/2017  Admission Diagnoses:  PRIMARY OSTEOARTHRITIS OF RIGHT [place] CUFF  Discharge Diagnoses: Patient Active Problem List   Diagnosis Date Noted  . 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 Blood in underwear 11/18/2016  . Leg cramps 08/15/2016  . BMI 40.0-44.9, adult ([place]) 05/22/2016  . COPD (chronic obstructive pulmonary disease) with emphysema ([place]) 04/16/2016  . Fatigue 04/16/2016  . Hypertension   . Tremor, essential   . OSA (obstructive sleep apnea)   . RLS (restless legs syndrome)   . Raynaud disease   . Generalized osteoarthritis of multiple sites   Advanced degenerative joint disease with rotator cuff tear, right shoulder.  Past Medical History:  Diagnosis Date  . Asthma   . Depression   . Dyspnea    doe  . Dysrhythmia    extra beat  . Fatty liver   . Fibromyalgia   . Generalized osteoarthritis of multiple sites   . History of hiatal hernia   . Hypertension   . Hypothyroidism   . OSA (obstructive sleep apnea)   . Pain    chronic ruq and back pain  . Panic attacks   . Raynaud disease   . Raynaud disease   . RLS (restless legs syndrome)   . 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 PATIENT IS UNCERTAIN  . Tremor, essential    Transfusion: None.   Consultants (if any): None.  Discharged Condition: Improved  Hospital Course: Kari Medina is an 71 y.o. female who was admitted 05/28/2017 with a diagnosis of advanced degenerative joint disease with rotator cuff tear of the right shoulder and went to the operating room on 05/28/2017 and underwent the above named procedures.    Surgeries: Procedure(s): REVERSE SHOULDER ARTHROPLASTY on 05/28/2017 Patient tolerated the surgery well. Taken to PACU where she was stabilized and then  transferred to the orthopedic floor.  Started on Lovenox 40mg  q 24 hrs. Foot pumps applied bilaterally at 80 mm. Heels elevated on bed with rolled towels. No evidence of DVT. Negative Homan. Physical therapy started on day #1 for gait training and transfer. OT started day #1 for ADL and assisted devices.  Patient's IV and foley were d/c on POD1.  Implants: All press-fit [employer] Comprehensive system with a #9 mini-humeral stem, a 44 mm humeral tray with a standard insert, and a mini-base plate with a 36 mm glenosphere.  She was given perioperative antibiotics:  Anti-infectives    Start     Dose/Rate Route Frequency Ordered Stop   05/28/17 1315  ceFAZolin (ANCEF) 3 g in dextrose 5 % 50 mL IVPB  Status:  Discontinued     3 g 130 mL/hr over 30 Minutes Intravenous Every 6 hours 05/28/17 1307 05/28/17 1324   05/28/17 1215  ceFAZolin (ANCEF) 3 g in dextrose 5 % 50 mL IVPB     3 g 130 mL/hr over 30 Minutes Intravenous Every 6 hours 05/28/17 1214 05/28/17 2340   05/28/17 0330  ceFAZolin (ANCEF) 3 g in dextrose 5 % 50 mL IVPB     3 g 130 mL/hr over 30 Minutes Intravenous  Once 05/28/17 0321 05/28/17 0822   05/21/17 0851  ceFAZolin (ANCEF) 2-4 GM/100ML-% IVPB    Comments:  Slemenda, Debbie: cabinet override      05/21/17 0851 05/21/17 2059    . She  was given sequential compression devices, early ambulation, and Lovenox for DVT prophylaxis.  She benefited maximally from the hospital stay and there were no complications.    Recent vital signs:  Vitals:   05/28/17 1920 05/29/17 0726  BP: 135/70 (!) 119/56  Pulse: (!) 58 (!) 52  Resp:    Temp:  99 F (37.2 C)    Recent laboratory studies:  Lab Results  Component Value Date   HGB 16.0 05/21/2017   HGB 16.4 (H) 04/16/2016   HGB 9.3 (L) 11/10/2010   Lab Results  Component Value Date   WBC 11.2 (H) 05/21/2017   PLT 293 05/21/2017   Lab Results  Component Value Date   INR 0.88 05/21/2017   Lab Results  Component Value Date   NA  140 05/21/2017   K 3.7 05/21/2017   CL 105 05/21/2017   CO2 26 05/21/2017   BUN 19 05/21/2017   CREATININE 0.76 05/21/2017   GLUCOSE 103 (H) 05/21/2017    Discharge Medications:   Allergies as of 05/29/2017      Reactions   Duloxetine Other (See Comments)   Sleeping problems   Latex Hives   Nickel Hives   Pramipexole Other (See Comments)   Caused hallucinations, says he can take name brand.   Prednisone    Topiramate Other (See Comments)   "spaced out"   Citalopram Anxiety   Paroxetine Hcl Anxiety   Sertraline Anxiety      Medication List    STOP taking these medications   acetaminophen 650 MG CR tablet Commonly known as:  TYLENOL   traMADol 50 MG tablet Commonly known as:  ULTRAM     TAKE these medications   ACIDOPHILUS PO Take 1 capsule by mouth daily.   aspirin 325 MG EC tablet Take 81.25 mg by mouth daily.   b complex vitamins tablet Take 1 tablet by mouth daily.   BREO ELLIPTA 100-25 MCG/INH Aepb Generic drug:  fluticasone furoate-vilanterol Inhale 1 puff into the lungs at bedtime.   calcium carbonate 600 MG Tabs tablet Commonly known as:  OS-CAL Take 600 mg by mouth daily with breakfast.   cetirizine 10 MG tablet Commonly known as:  ZYRTEC Take 10 mg by mouth daily.   cholecalciferol 1000 units tablet Commonly known as:  VITAMIN D Take 2,000 Units by mouth 2 (two) times daily.   fluticasone 50 MCG/ACT nasal spray Commonly known as:  FLONASE Place 1 spray into both nostrils daily as needed. For stuffy nose   hydrochlorothiazide 12.5 MG tablet Commonly known as:  HYDRODIURIL TAKE 1 TABLET(12.5 MG) BY MOUTH EVERY DAY   Magnesium 500 MG Tabs Take 500 mg by mouth 2 (two) times daily.   montelukast 10 MG tablet Commonly known as:  SINGULAIR TAKE 1 TABLET(10 MG) BY MOUTH AT BEDTIME   oxyCODONE 5 MG immediate release tablet Commonly known as:  Oxy IR/ROXICODONE Take 1-2 tablets (5-10 mg total) by mouth every 4 (four) hours as needed for  breakthrough pain.   Potassium 99 MG Tabs Take 99 mg by mouth daily.   propranolol 40 MG tablet Commonly known as:  INDERAL TAKE 1 TABLET BY MOUTH TWICE DAILY   rOPINIRole 2 MG tablet Commonly known as:  REQUIP TAKE 1 TABLET(2 MG) BY MOUTH EVERY NIGHT   SYNTHROID 200 MCG tablet Generic drug:  levothyroxine Take 1 tablet (200 mcg total) by mouth daily before breakfast.   Turmeric Curcumin 500 MG Caps Take 500 mg by mouth at bedtime.   VENTOLIN  HFA 108 (90 Base) MCG/ACT inhaler Generic drug:  albuterol Inhale 2 puffs into the lungs every 6 (six) hours as needed. For shortness of breath/wheezing   vitamin C 500 MG tablet Commonly known as:  ASCORBIC ACID Take 500 mg by mouth daily.       Diagnostic Studies: Dg Shoulder Right Port  Result Date: 05/28/2017 CLINICAL DATA:  Status post reverse total right shoulder arthroplasty EXAM: PORTABLE RIGHT SHOULDER COMPARISON:  04/09/2017 right shoulder MRI FINDINGS: Status post interval reverse total right shoulder arthroplasty, with well-positioned right clinoid and right proximal humeral prostheses. No right glenohumeral dislocation. No osseous fracture. No suspicious focal osseous lesion. Skin staples overlie the right shoulder. Expected mild postoperative gas surrounding the right shoulder. Mild right lung base atelectasis. IMPRESSION: Satisfactory immediate postoperative appearance status post reverse total right shoulder arthroplasty. Electronically Signed   By: Ilona Sorrel M.D.   On: 05/28/2017 11:20   Disposition: Plan is for discharge home this afternoon pending PT this AM and urinating without the foley.  Discharge Instructions    Initiate hypothermia protocol    Complete by:  As directed       Follow-up Information    Lattie Corns, PA-C Follow up in 10 day(s).   Specialty:  Physician Assistant Why:  Electa Sniff information: [place]  [id number] [phone]          Signed: Judson Roch PA-C 05/29/2017, 7:56 AM

## 2017-05-29 NOTE — Progress Notes (Signed)
Physical Therapy Treatment Patient Details Name: Kari Medina MRN: [id number] DOB: [birth year] Today's Date: 05/29/2017    History of Present Illness Pt underwent R reverse total shoulder arthroplasty and is POD#0 at time of PT evaluation. No reported post-op complications. Pt is eager to get out of bed    PT Comments    Pt did well with prolonged ambulation and did not show excessive fatigue or any safety issues.  She generally showed good confidence with mobility and ambulation and showed that she can enter/exit steps at home w/o issue.   Pt safe to go home, will benefit from HHPT per shld replacement protocol.  Follow Up Recommendations  Home health PT     Equipment Recommendations  None recommended by PT    Recommendations for Other Services       Precautions / Restrictions Precautions Precautions: Shoulder Type of Shoulder Precautions: No AROM/PROM of R shoulder. AROM flex/ext R elbow, wrist, and fingers. Shoulder Interventions: Shoulder sling/immobilizer Precaution Comments: adjusted shld sling as it had shifted signficantly and needed tightened/rotated Restrictions Weight Bearing Restrictions: Yes RUE Weight Bearing: Non weight bearing    Mobility  Bed Mobility               General bed mobility comments: Pt in recliner on arrival, reports she did not need a lot of assist to get to EOB with nursing this AM  Transfers Overall transfer level: Modified independent Equipment used: None Transfers: Sit to/from Stand Sit to Stand: Modified independent (Device/Increase time)         General transfer comment: Pt able to rise to standing w/o hesitation and with good confidence/safety.  Ambulation/Gait Ambulation/Gait assistance: Min guard Ambulation Distance (Feet): 200 Feet Assistive device: None       General Gait Details: Pt able to ambulate with good confidence and consistent cadence.  She shows no hesitation, though she does have some fatigue.     Stairs Stairs: Yes   Stair Management: One rail Left Number of Stairs: 3 General stair comments: Pt negotiates up/down steps w/o issues, good safety and confidence  Wheelchair Mobility    Modified Rankin (Stroke Patients Only)       Balance Overall balance assessment: Modified Independent                                          Cognition Arousal/Alertness: Awake/alert Behavior During Therapy: WFL for tasks assessed/performed Overall Cognitive Status: Within Functional Limits for tasks assessed                                        Exercises      General Comments        Pertinent Vitals/Pain Pain Assessment: 0-10 Pain Score: 3     Home Living                      Prior Function            PT Goals (current goals can now be found in the care plan section) Progress towards PT goals: Progressing toward goals    Frequency    BID      PT Plan Current plan remains appropriate    Co-evaluation              AM-PAC  PT "6 Clicks" Daily Activity  Outcome Measure  Difficulty turning over in bed (including adjusting bedclothes, sheets and blankets)?: A Little Difficulty moving from lying on back to sitting on the side of the bed? : A Little Difficulty sitting down on and standing up from a chair with arms (e.g., wheelchair, bedside commode, etc,.)?: A Little Help needed moving to and from a bed to chair (including a wheelchair)?: None Help needed walking in hospital room?: None Help needed climbing 3-5 steps with a railing? : A Little 6 Click Score: 20    End of Session Equipment Utilized During Treatment: Gait belt Activity Tolerance: Patient tolerated treatment well Patient left: in chair;with call bell/phone within reach;with chair alarm set Nurse Communication: Mobility status PT Visit Diagnosis: Muscle weakness (generalized) (M62.81);Pain Pain - Right/Left: Right Pain - part of body: Shoulder      Time: 2703-5009 PT Time Calculation (min) (ACUTE ONLY): 15 min  Charges:  $Gait Training: 8-22 mins                    G Codes:       Kreg Shropshire, DPT 05/29/2017, 10:50 AM

## 2017-05-30 DIAGNOSIS — M159 Polyosteoarthritis, unspecified: Secondary | ICD-10-CM | POA: Diagnosis not present

## 2017-05-30 DIAGNOSIS — Z471 Aftercare following joint replacement surgery: Secondary | ICD-10-CM | POA: Diagnosis not present

## 2017-05-30 DIAGNOSIS — G250 Essential tremor: Secondary | ICD-10-CM | POA: Diagnosis not present

## 2017-05-30 DIAGNOSIS — J449 Chronic obstructive pulmonary disease, unspecified: Secondary | ICD-10-CM | POA: Diagnosis not present

## 2017-05-30 DIAGNOSIS — M797 Fibromyalgia: Secondary | ICD-10-CM | POA: Diagnosis not present

## 2017-05-30 DIAGNOSIS — I7300 Raynaud's syndrome without gangrene: Secondary | ICD-10-CM | POA: Diagnosis not present

## 2017-06-01 DIAGNOSIS — M797 Fibromyalgia: Secondary | ICD-10-CM | POA: Diagnosis not present

## 2017-06-01 DIAGNOSIS — M159 Polyosteoarthritis, unspecified: Secondary | ICD-10-CM | POA: Diagnosis not present

## 2017-06-01 DIAGNOSIS — Z471 Aftercare following joint replacement surgery: Secondary | ICD-10-CM | POA: Diagnosis not present

## 2017-06-01 DIAGNOSIS — J449 Chronic obstructive pulmonary disease, unspecified: Secondary | ICD-10-CM | POA: Diagnosis not present

## 2017-06-01 DIAGNOSIS — I7300 Raynaud's syndrome without gangrene: Secondary | ICD-10-CM | POA: Diagnosis not present

## 2017-06-01 DIAGNOSIS — G250 Essential tremor: Secondary | ICD-10-CM | POA: Diagnosis not present

## 2017-06-01 LAB — SURGICAL PATHOLOGY

## 2017-06-02 DIAGNOSIS — M797 Fibromyalgia: Secondary | ICD-10-CM | POA: Diagnosis not present

## 2017-06-02 DIAGNOSIS — I7300 Raynaud's syndrome without gangrene: Secondary | ICD-10-CM | POA: Diagnosis not present

## 2017-06-02 DIAGNOSIS — G250 Essential tremor: Secondary | ICD-10-CM | POA: Diagnosis not present

## 2017-06-02 DIAGNOSIS — J449 Chronic obstructive pulmonary disease, unspecified: Secondary | ICD-10-CM | POA: Diagnosis not present

## 2017-06-02 DIAGNOSIS — Z471 Aftercare following joint replacement surgery: Secondary | ICD-10-CM | POA: Diagnosis not present

## 2017-06-02 DIAGNOSIS — M159 Polyosteoarthritis, unspecified: Secondary | ICD-10-CM | POA: Diagnosis not present

## 2017-06-03 DIAGNOSIS — M797 Fibromyalgia: Secondary | ICD-10-CM | POA: Diagnosis not present

## 2017-06-03 DIAGNOSIS — M159 Polyosteoarthritis, unspecified: Secondary | ICD-10-CM | POA: Diagnosis not present

## 2017-06-03 DIAGNOSIS — Z471 Aftercare following joint replacement surgery: Secondary | ICD-10-CM | POA: Diagnosis not present

## 2017-06-03 DIAGNOSIS — I7300 Raynaud's syndrome without gangrene: Secondary | ICD-10-CM | POA: Diagnosis not present

## 2017-06-03 DIAGNOSIS — J449 Chronic obstructive pulmonary disease, unspecified: Secondary | ICD-10-CM | POA: Diagnosis not present

## 2017-06-03 DIAGNOSIS — G250 Essential tremor: Secondary | ICD-10-CM | POA: Diagnosis not present

## 2017-06-04 DIAGNOSIS — M159 Polyosteoarthritis, unspecified: Secondary | ICD-10-CM | POA: Diagnosis not present

## 2017-06-04 DIAGNOSIS — G250 Essential tremor: Secondary | ICD-10-CM | POA: Diagnosis not present

## 2017-06-04 DIAGNOSIS — I7300 Raynaud's syndrome without gangrene: Secondary | ICD-10-CM | POA: Diagnosis not present

## 2017-06-04 DIAGNOSIS — Z471 Aftercare following joint replacement surgery: Secondary | ICD-10-CM | POA: Diagnosis not present

## 2017-06-04 DIAGNOSIS — M797 Fibromyalgia: Secondary | ICD-10-CM | POA: Diagnosis not present

## 2017-06-04 DIAGNOSIS — J449 Chronic obstructive pulmonary disease, unspecified: Secondary | ICD-10-CM | POA: Diagnosis not present

## 2017-06-05 DIAGNOSIS — M159 Polyosteoarthritis, unspecified: Secondary | ICD-10-CM | POA: Diagnosis not present

## 2017-06-05 DIAGNOSIS — Z471 Aftercare following joint replacement surgery: Secondary | ICD-10-CM | POA: Diagnosis not present

## 2017-06-05 DIAGNOSIS — I7300 Raynaud's syndrome without gangrene: Secondary | ICD-10-CM | POA: Diagnosis not present

## 2017-06-05 DIAGNOSIS — J449 Chronic obstructive pulmonary disease, unspecified: Secondary | ICD-10-CM | POA: Diagnosis not present

## 2017-06-05 DIAGNOSIS — M797 Fibromyalgia: Secondary | ICD-10-CM | POA: Diagnosis not present

## 2017-06-05 DIAGNOSIS — G250 Essential tremor: Secondary | ICD-10-CM | POA: Diagnosis not present

## 2017-06-07 DIAGNOSIS — M797 Fibromyalgia: Secondary | ICD-10-CM | POA: Diagnosis not present

## 2017-06-07 DIAGNOSIS — G250 Essential tremor: Secondary | ICD-10-CM | POA: Diagnosis not present

## 2017-06-07 DIAGNOSIS — Z471 Aftercare following joint replacement surgery: Secondary | ICD-10-CM | POA: Diagnosis not present

## 2017-06-07 DIAGNOSIS — M159 Polyosteoarthritis, unspecified: Secondary | ICD-10-CM | POA: Diagnosis not present

## 2017-06-07 DIAGNOSIS — I7300 Raynaud's syndrome without gangrene: Secondary | ICD-10-CM | POA: Diagnosis not present

## 2017-06-07 DIAGNOSIS — J449 Chronic obstructive pulmonary disease, unspecified: Secondary | ICD-10-CM | POA: Diagnosis not present

## 2017-06-09 DIAGNOSIS — M159 Polyosteoarthritis, unspecified: Secondary | ICD-10-CM | POA: Diagnosis not present

## 2017-06-09 DIAGNOSIS — M797 Fibromyalgia: Secondary | ICD-10-CM | POA: Diagnosis not present

## 2017-06-09 DIAGNOSIS — J449 Chronic obstructive pulmonary disease, unspecified: Secondary | ICD-10-CM | POA: Diagnosis not present

## 2017-06-09 DIAGNOSIS — G250 Essential tremor: Secondary | ICD-10-CM | POA: Diagnosis not present

## 2017-06-09 DIAGNOSIS — I7300 Raynaud's syndrome without gangrene: Secondary | ICD-10-CM | POA: Diagnosis not present

## 2017-06-09 DIAGNOSIS — Z471 Aftercare following joint replacement surgery: Secondary | ICD-10-CM | POA: Diagnosis not present

## 2017-06-11 DIAGNOSIS — I7300 Raynaud's syndrome without gangrene: Secondary | ICD-10-CM | POA: Diagnosis not present

## 2017-06-11 DIAGNOSIS — Z471 Aftercare following joint replacement surgery: Secondary | ICD-10-CM | POA: Diagnosis not present

## 2017-06-11 DIAGNOSIS — M797 Fibromyalgia: Secondary | ICD-10-CM | POA: Diagnosis not present

## 2017-06-11 DIAGNOSIS — M159 Polyosteoarthritis, unspecified: Secondary | ICD-10-CM | POA: Diagnosis not present

## 2017-06-11 DIAGNOSIS — J449 Chronic obstructive pulmonary disease, unspecified: Secondary | ICD-10-CM | POA: Diagnosis not present

## 2017-06-11 DIAGNOSIS — G250 Essential tremor: Secondary | ICD-10-CM | POA: Diagnosis not present

## 2017-06-12 DIAGNOSIS — M25611 Stiffness of right shoulder, not elsewhere classified: Secondary | ICD-10-CM | POA: Diagnosis not present

## 2017-06-12 DIAGNOSIS — Z96611 Presence of right artificial shoulder joint: Secondary | ICD-10-CM | POA: Diagnosis not present

## 2017-06-12 DIAGNOSIS — R29898 Other symptoms and signs involving the musculoskeletal system: Secondary | ICD-10-CM | POA: Diagnosis not present

## 2017-06-12 DIAGNOSIS — M25511 Pain in right shoulder: Secondary | ICD-10-CM | POA: Diagnosis not present

## 2017-06-16 DIAGNOSIS — Z96611 Presence of right artificial shoulder joint: Secondary | ICD-10-CM | POA: Diagnosis not present

## 2017-06-18 DIAGNOSIS — Z96611 Presence of right artificial shoulder joint: Secondary | ICD-10-CM | POA: Diagnosis not present

## 2017-06-23 DIAGNOSIS — Z96611 Presence of right artificial shoulder joint: Secondary | ICD-10-CM | POA: Diagnosis not present

## 2017-06-23 DIAGNOSIS — M25511 Pain in right shoulder: Secondary | ICD-10-CM | POA: Diagnosis not present

## 2017-06-30 DIAGNOSIS — M25511 Pain in right shoulder: Secondary | ICD-10-CM | POA: Diagnosis not present

## 2017-06-30 DIAGNOSIS — Z96611 Presence of right artificial shoulder joint: Secondary | ICD-10-CM | POA: Diagnosis not present

## 2017-07-02 ENCOUNTER — Other Ambulatory Visit: Payer: Self-pay | Admitting: Internal Medicine

## 2017-07-08 DIAGNOSIS — M25511 Pain in right shoulder: Secondary | ICD-10-CM | POA: Diagnosis not present

## 2017-07-08 DIAGNOSIS — Z96611 Presence of right artificial shoulder joint: Secondary | ICD-10-CM | POA: Diagnosis not present

## 2017-07-10 DIAGNOSIS — Z96611 Presence of right artificial shoulder joint: Secondary | ICD-10-CM | POA: Diagnosis not present

## 2017-07-16 DIAGNOSIS — M25511 Pain in right shoulder: Secondary | ICD-10-CM | POA: Diagnosis not present

## 2017-07-16 DIAGNOSIS — R29898 Other symptoms and signs involving the musculoskeletal system: Secondary | ICD-10-CM | POA: Diagnosis not present

## 2017-07-16 DIAGNOSIS — M25611 Stiffness of right shoulder, not elsewhere classified: Secondary | ICD-10-CM | POA: Diagnosis not present

## 2017-07-16 DIAGNOSIS — Z96611 Presence of right artificial shoulder joint: Secondary | ICD-10-CM | POA: Diagnosis not present

## 2017-07-21 DIAGNOSIS — M25511 Pain in right shoulder: Secondary | ICD-10-CM | POA: Diagnosis not present

## 2017-07-21 DIAGNOSIS — Z96611 Presence of right artificial shoulder joint: Secondary | ICD-10-CM | POA: Diagnosis not present

## 2017-07-23 ENCOUNTER — Other Ambulatory Visit: Payer: Self-pay | Admitting: Internal Medicine

## 2017-07-23 DIAGNOSIS — M25511 Pain in right shoulder: Secondary | ICD-10-CM | POA: Diagnosis not present

## 2017-07-23 DIAGNOSIS — Z96611 Presence of right artificial shoulder joint: Secondary | ICD-10-CM | POA: Diagnosis not present

## 2017-07-27 DIAGNOSIS — M25511 Pain in right shoulder: Secondary | ICD-10-CM | POA: Diagnosis not present

## 2017-07-27 DIAGNOSIS — R29898 Other symptoms and signs involving the musculoskeletal system: Secondary | ICD-10-CM | POA: Diagnosis not present

## 2017-07-27 DIAGNOSIS — Z96611 Presence of right artificial shoulder joint: Secondary | ICD-10-CM | POA: Diagnosis not present

## 2017-07-27 DIAGNOSIS — M25611 Stiffness of right shoulder, not elsewhere classified: Secondary | ICD-10-CM | POA: Diagnosis not present

## 2017-07-29 DIAGNOSIS — Z96611 Presence of right artificial shoulder joint: Secondary | ICD-10-CM | POA: Diagnosis not present

## 2017-07-29 DIAGNOSIS — M25511 Pain in right shoulder: Secondary | ICD-10-CM | POA: Diagnosis not present

## 2017-07-29 DIAGNOSIS — R29898 Other symptoms and signs involving the musculoskeletal system: Secondary | ICD-10-CM | POA: Diagnosis not present

## 2017-07-29 DIAGNOSIS — M25611 Stiffness of right shoulder, not elsewhere classified: Secondary | ICD-10-CM | POA: Diagnosis not present

## 2017-08-04 DIAGNOSIS — M25511 Pain in right shoulder: Secondary | ICD-10-CM | POA: Diagnosis not present

## 2017-08-04 DIAGNOSIS — Z96611 Presence of right artificial shoulder joint: Secondary | ICD-10-CM | POA: Diagnosis not present

## 2017-08-05 DIAGNOSIS — J449 Chronic obstructive pulmonary disease, unspecified: Secondary | ICD-10-CM | POA: Diagnosis not present

## 2017-08-05 DIAGNOSIS — Z6841 Body Mass Index (BMI) 40.0 and over, adult: Secondary | ICD-10-CM | POA: Diagnosis not present

## 2017-08-05 DIAGNOSIS — R0602 Shortness of breath: Secondary | ICD-10-CM | POA: Diagnosis not present

## 2017-08-05 DIAGNOSIS — J441 Chronic obstructive pulmonary disease with (acute) exacerbation: Secondary | ICD-10-CM | POA: Diagnosis not present

## 2017-08-06 DIAGNOSIS — Z96611 Presence of right artificial shoulder joint: Secondary | ICD-10-CM | POA: Diagnosis not present

## 2017-08-06 DIAGNOSIS — M25511 Pain in right shoulder: Secondary | ICD-10-CM | POA: Diagnosis not present

## 2017-08-11 DIAGNOSIS — Z96611 Presence of right artificial shoulder joint: Secondary | ICD-10-CM | POA: Diagnosis not present

## 2017-08-11 DIAGNOSIS — M25511 Pain in right shoulder: Secondary | ICD-10-CM | POA: Diagnosis not present

## 2017-08-14 DIAGNOSIS — M25511 Pain in right shoulder: Secondary | ICD-10-CM | POA: Diagnosis not present

## 2017-08-14 DIAGNOSIS — Z96611 Presence of right artificial shoulder joint: Secondary | ICD-10-CM | POA: Diagnosis not present

## 2017-08-17 DIAGNOSIS — M25511 Pain in right shoulder: Secondary | ICD-10-CM | POA: Diagnosis not present

## 2017-08-17 DIAGNOSIS — Z96611 Presence of right artificial shoulder joint: Secondary | ICD-10-CM | POA: Diagnosis not present

## 2017-08-19 ENCOUNTER — Other Ambulatory Visit: Payer: Self-pay | Admitting: Internal Medicine

## 2017-08-20 ENCOUNTER — Encounter: Payer: Self-pay | Admitting: Internal Medicine

## 2017-08-20 ENCOUNTER — Telehealth: Payer: Self-pay

## 2017-08-20 ENCOUNTER — Ambulatory Visit (INDEPENDENT_AMBULATORY_CARE_PROVIDER_SITE_OTHER): Payer: Medicare Other | Admitting: Internal Medicine

## 2017-08-20 VITALS — BP 132/84 | HR 73 | Temp 97.7°F | Ht 65.0 in | Wt 287.0 lb

## 2017-08-20 DIAGNOSIS — Z7189 Other specified counseling: Secondary | ICD-10-CM | POA: Diagnosis not present

## 2017-08-20 DIAGNOSIS — I10 Essential (primary) hypertension: Secondary | ICD-10-CM | POA: Diagnosis not present

## 2017-08-20 DIAGNOSIS — Z0000 Encounter for general adult medical examination without abnormal findings: Secondary | ICD-10-CM | POA: Diagnosis not present

## 2017-08-20 DIAGNOSIS — R32 Unspecified urinary incontinence: Secondary | ICD-10-CM

## 2017-08-20 DIAGNOSIS — Z23 Encounter for immunization: Secondary | ICD-10-CM | POA: Diagnosis not present

## 2017-08-20 DIAGNOSIS — J439 Emphysema, unspecified: Secondary | ICD-10-CM

## 2017-08-20 DIAGNOSIS — Z6841 Body Mass Index (BMI) 40.0 and over, adult: Secondary | ICD-10-CM | POA: Diagnosis not present

## 2017-08-20 MED ORDER — TRAMADOL HCL 50 MG PO TABS: 25.0000 mg | ORAL_TABLET | Freq: Three times a day (TID) | ORAL | 0 refills | Status: DC | PRN

## 2017-08-20 NOTE — Assessment & Plan Note (Signed)
BP Readings from Last 3 Encounters:  08/20/17 132/84  05/29/17 (!) 119/56  05/21/17 138/73   Good control Recent labs fine other than glucose (but not fasting)

## 2017-08-20 NOTE — Telephone Encounter (Signed)
Left refill on voice mail at pharmacy Advised pt.

## 2017-08-20 NOTE — Assessment & Plan Note (Signed)
See social history 

## 2017-08-20 NOTE — Assessment & Plan Note (Signed)
Symptoms controlled Now has nebulizer Now with CPAP also Dr Raul Del

## 2017-08-20 NOTE — Assessment & Plan Note (Signed)
Wants to see urologist to see if there are any options Doesn't seem to be OAB--more likely mechanical

## 2017-08-20 NOTE — Telephone Encounter (Signed)
Pt remembered after her visit that she was going to ask for a refill of tramadol. It is not no her med list any longer. She rarely takes it but is out. Last filled 11-18-16 #60. Please advise.

## 2017-08-20 NOTE — Assessment & Plan Note (Signed)
I have personally reviewed the Medicare Annual Wellness questionnaire and have noted 1. The patient's medical and social history 2. Their use of alcohol, tobacco or illicit drugs 3. Their current medications and supplements 4. The patient's functional ability including ADL's, fall risks, home safety risks and hearing or visual             impairment. 5. Diet and physical activities 6. Evidence for depression or mood disorders  The patients weight, height, BMI and visual acuity have been recorded in the chart I have made referrals, counseling and provided education to the patient based review of the above and I have provided the pt with a written personalized care plan for preventive services.  I have provided you with a copy of your personalized plan for preventive services. Please take the time to review along with your updated medication list.  Needs pneumovax Recommended flu vaccine later this fall She will set up her mammogram Colon due 2021 No pap due to age

## 2017-08-20 NOTE — Assessment & Plan Note (Signed)
DASH info Counseled--needs to be more careful

## 2017-08-20 NOTE — Addendum Note (Signed)
Addended by: Pilar Grammes on: 08/20/2017 03:55 PM   Modules accepted: Orders

## 2017-08-20 NOTE — Patient Instructions (Signed)
DASH Eating Plan DASH stands for "Dietary Approaches to Stop Hypertension." The DASH eating plan is a healthy eating plan that has been shown to reduce high blood pressure (hypertension). It may also reduce your risk for type 2 diabetes, heart disease, and stroke. The DASH eating plan may also help with weight loss. What are tips for following this plan? General guidelines  Avoid eating more than 2,300 mg (milligrams) of salt (sodium) a day. If you have hypertension, you may need to reduce your sodium intake to 1,500 mg a day.  Limit alcohol intake to no more than 1 drink a day for nonpregnant women and 2 drinks a day for men. One drink equals 12 oz of beer, 5 oz of wine, or 1 oz of hard liquor.  Work with your health care provider to maintain a healthy body weight or to lose weight. Ask what an ideal weight is for you.  Get at least 30 minutes of exercise that causes your heart to beat faster (aerobic exercise) most days of the week. Activities may include walking, swimming, or biking.  Work with your health care provider or diet and nutrition specialist (dietitian) to adjust your eating plan to your individual calorie needs. Reading food labels  Check food labels for the amount of sodium per serving. Choose foods with less than 5 percent of the Daily Value of sodium. Generally, foods with less than 300 mg of sodium per serving fit into this eating plan.  To find whole grains, look for the word "whole" as the first word in the ingredient list. Shopping  Buy products labeled as "low-sodium" or "no salt added."  Buy fresh foods. Avoid canned foods and premade or frozen meals. Cooking  Avoid adding salt when cooking. Use salt-free seasonings or herbs instead of table salt or sea salt. Check with your health care provider or pharmacist before using salt substitutes.  Do not fry foods. Cook foods using healthy methods such as baking, boiling, grilling, and broiling instead.  Cook with  heart-healthy oils, such as olive, canola, soybean, or sunflower oil. Meal planning   Eat a balanced diet that includes: ? 5 or more servings of fruits and vegetables each day. At each meal, try to fill half of your plate with fruits and vegetables. ? Up to 6-8 servings of whole grains each day. ? Less than 6 oz of lean meat, poultry, or fish each day. A 3-oz serving of meat is about the same size as a deck of cards. One egg equals 1 oz. ? 2 servings of low-fat dairy each day. ? A serving of nuts, seeds, or beans 5 times each week. ? Heart-healthy fats. Healthy fats called Omega-3 fatty acids are found in foods such as flaxseeds and coldwater fish, like sardines, salmon, and mackerel.  Limit how much you eat of the following: ? Canned or prepackaged foods. ? Food that is high in trans fat, such as fried foods. ? Food that is high in saturated fat, such as fatty meat. ? Sweets, desserts, sugary drinks, and other foods with added sugar. ? Full-fat dairy products.  Do not salt foods before eating.  Try to eat at least 2 vegetarian meals each week.  Eat more home-cooked food and less restaurant, buffet, and fast food.  When eating at a restaurant, ask that your food be prepared with less salt or no salt, if possible. What foods are recommended? The items listed may not be a complete list. Talk with your dietitian about what   dietary choices are best for you. Grains Whole-grain or whole-wheat bread. Whole-grain or whole-wheat pasta. Brown rice. Oatmeal. Quinoa. Bulgur. Whole-grain and low-sodium cereals. Pita bread. Low-fat, low-sodium crackers. Whole-wheat flour tortillas. Vegetables Fresh or frozen vegetables (raw, steamed, roasted, or grilled). Low-sodium or reduced-sodium tomato and vegetable juice. Low-sodium or reduced-sodium tomato sauce and tomato paste. Low-sodium or reduced-sodium canned vegetables. Fruits All fresh, dried, or frozen fruit. Canned fruit in natural juice (without  added sugar). Meat and other protein foods Skinless chicken or turkey. Ground chicken or turkey. Pork with fat trimmed off. Fish and seafood. Egg whites. Dried beans, peas, or lentils. Unsalted nuts, nut butters, and seeds. Unsalted canned beans. Lean cuts of beef with fat trimmed off. Low-sodium, lean deli meat. Dairy Low-fat (1%) or fat-free (skim) milk. Fat-free, low-fat, or reduced-fat cheeses. Nonfat, low-sodium ricotta or cottage cheese. Low-fat or nonfat yogurt. Low-fat, low-sodium cheese. Fats and oils Soft margarine without trans fats. Vegetable oil. Low-fat, reduced-fat, or light mayonnaise and salad dressings (reduced-sodium). Canola, safflower, olive, soybean, and sunflower oils. Avocado. Seasoning and other foods Herbs. Spices. Seasoning mixes without salt. Unsalted popcorn and pretzels. Fat-free sweets. What foods are not recommended? The items listed may not be a complete list. Talk with your dietitian about what dietary choices are best for you. Grains Baked goods made with fat, such as croissants, muffins, or some breads. Dry pasta or rice meal packs. Vegetables Creamed or fried vegetables. Vegetables in a cheese sauce. Regular canned vegetables (not low-sodium or reduced-sodium). Regular canned tomato sauce and paste (not low-sodium or reduced-sodium). Regular tomato and vegetable juice (not low-sodium or reduced-sodium). Pickles. Olives. Fruits Canned fruit in a light or heavy syrup. Fried fruit. Fruit in cream or butter sauce. Meat and other protein foods Fatty cuts of meat. Ribs. Fried meat. Bacon. Sausage. Bologna and other processed lunch meats. Salami. Fatback. Hotdogs. Bratwurst. Salted nuts and seeds. Canned beans with added salt. Canned or smoked fish. Whole eggs or egg yolks. Chicken or turkey with skin. Dairy Whole or 2% milk, cream, and half-and-half. Whole or full-fat cream cheese. Whole-fat or sweetened yogurt. Full-fat cheese. Nondairy creamers. Whipped toppings.  Processed cheese and cheese spreads. Fats and oils Butter. Stick margarine. Lard. Shortening. Ghee. Bacon fat. Tropical oils, such as coconut, palm kernel, or palm oil. Seasoning and other foods Salted popcorn and pretzels. Onion salt, garlic salt, seasoned salt, table salt, and sea salt. Worcestershire sauce. Tartar sauce. Barbecue sauce. Teriyaki sauce. Soy sauce, including reduced-sodium. Steak sauce. Canned and packaged gravies. Fish sauce. Oyster sauce. Cocktail sauce. Horseradish that you find on the shelf. Ketchup. Mustard. Meat flavorings and tenderizers. Bouillon cubes. Hot sauce and Tabasco sauce. Premade or packaged marinades. Premade or packaged taco seasonings. Relishes. Regular salad dressings. Where to find more information:  National Heart, Lung, and Blood Institute: www.nhlbi.nih.gov  American Heart Association: www.heart.org Summary  The DASH eating plan is a healthy eating plan that has been shown to reduce high blood pressure (hypertension). It may also reduce your risk for type 2 diabetes, heart disease, and stroke.  With the DASH eating plan, you should limit salt (sodium) intake to 2,300 mg a day. If you have hypertension, you may need to reduce your sodium intake to 1,500 mg a day.  When on the DASH eating plan, aim to eat more fresh fruits and vegetables, whole grains, lean proteins, low-fat dairy, and heart-healthy fats.  Work with your health care provider or diet and nutrition specialist (dietitian) to adjust your eating plan to your individual   calorie needs. This information is not intended to replace advice given to you by your health care provider. Make sure you discuss any questions you have with your health care provider. Document Released: 11/27/2011 Document Revised: 12/01/2016 Document Reviewed: 12/01/2016 Elsevier Interactive Patient Education  2017 [employer]  

## 2017-08-20 NOTE — Telephone Encounter (Signed)
Okay #60 x 0 

## 2017-08-20 NOTE — Progress Notes (Signed)
Subjective:    Patient ID: Kari Medina, female    DOB: 1946-04-16, 71 y.o.   MRN: [id number]  HPI Here for Medicare wellness and follow up of chronic health conditions Reviewed advanced directives Reviewed other doctors-- Dr Ubaldo Glassing (cardiology), Dr Raul Del (pulmonary), Dr Roland Rack (ortho), Dr Trudie Reed (rheum), Dr Oval Linsey (eye doctor)--will need new one at [facility]---may have early macular degeneration, Dr Donneta Romberg (allergist), Dr Quillian Quince (dentist) Vision is okay with correction Hearing is fine No alcohol No tobacco products No falls Some depressed mood after the surgery. Not anhedonic Husband has done most of the housework for some time due to the shoulder pain. She cooks but sits on a stool. Does all ADLs Memory has been fine  Had right shoulder replacement in June--doing fairly well but still having therapy This is the only hospital visit in the past year Lots of pain --arthritis all over Occasional tylenol and will take 1/4 tramadol at bedtime Sees rheumatologist  Still having easy SOB Needs handicapped permit No regular cough Considering allergy immunotherapy from Dr Donneta Romberg  Really bothered by her urinary incontinence Interested in seeing specialist about this Wonders about a bladder tacking It does affect her ability to get out without embarrassment Urgency and leaks when she stands Only wears pads  No chest pain No palpitations  Some dizziness--no syncope No edema  Current Outpatient Prescriptions on File Prior to Visit  Medication Sig Dispense Refill  . aspirin 325 MG EC tablet Take 81.25 mg by mouth daily.    Marland Kitchen b complex vitamins tablet Take 1 tablet by mouth daily.      Marland Kitchen BREO ELLIPTA 100-25 MCG/INH AEPB Inhale 1 puff into the lungs at bedtime.  5  . calcium carbonate (OS-CAL) 600 MG TABS Take 600 mg by mouth daily with breakfast.     . cetirizine (ZYRTEC) 10 MG tablet Take 10 mg by mouth daily.    . cholecalciferol (VITAMIN D) 1000 units tablet Take  2,000 Units by mouth 2 (two) times daily.    . fluticasone (FLONASE) 50 MCG/ACT nasal spray Place 1 spray into both nostrils daily as needed. For stuffy nose  3  . hydrochlorothiazide (HYDRODIURIL) 12.5 MG tablet TAKE 1 TABLET(12.5 MG) BY MOUTH EVERY DAY 30 tablet 1  . Lactobacillus (ACIDOPHILUS PO) Take 1 capsule by mouth daily.    . Magnesium 500 MG TABS Take 500 mg by mouth 2 (two) times daily.    . montelukast (SINGULAIR) 10 MG tablet TAKE 1 TABLET(10 MG) BY MOUTH AT BEDTIME 90 tablet 3  . Potassium 99 MG TABS Take 99 mg by mouth daily.    . propranolol (INDERAL) 40 MG tablet TAKE 1 TABLET BY MOUTH TWICE DAILY 60 tablet 1  . rOPINIRole (REQUIP) 2 MG tablet TAKE 1 TABLET(2 MG) BY MOUTH EVERY NIGHT 90 tablet 0  . SYNTHROID 200 MCG tablet Take 1 tablet (200 mcg total) by mouth daily before breakfast. 90 tablet 3  . Turmeric Curcumin 500 MG CAPS Take 500 mg by mouth at bedtime.     . VENTOLIN HFA 108 (90 Base) MCG/ACT inhaler Inhale 2 puffs into the lungs every 6 (six) hours as needed. For shortness of breath/wheezing  11  . vitamin C (ASCORBIC ACID) 500 MG tablet Take 500 mg by mouth daily.     No current facility-administered medications on file prior to visit.     Allergies  Allergen Reactions  . Duloxetine Other (See Comments)    Sleeping problems  . Latex Hives  .  Nickel Hives  . Pramipexole Other (See Comments)    Caused hallucinations, says he can take name brand.  . Prednisone   . Topiramate Other (See Comments)    "spaced out"  . Citalopram Anxiety  . Paroxetine Hcl Anxiety  . Sertraline Anxiety    Past Medical History:  Diagnosis Date  . Asthma   . Depression   . Dyspnea    doe  . Dysrhythmia    extra beat  . Fatty liver   . Fibromyalgia   . Generalized osteoarthritis of multiple sites   . History of hiatal hernia   . Hypertension   . Hypothyroidism   . OSA (obstructive sleep apnea)   . Pain    chronic ruq and back pain  . Panic attacks   . Raynaud disease    . Raynaud disease   . RLS (restless legs syndrome)   . 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 PATIENT IS UNCERTAIN  . Tremor, essential     Past Surgical History:  Procedure Laterality Date  . CHOLECYSTECTOMY    . JOINT REPLACEMENT Bilateral    2008/2011  . REVERSE SHOULDER ARTHROPLASTY Right 05/28/2017   Procedure: REVERSE SHOULDER ARTHROPLASTY;  Surgeon: Corky Mull, MD;  Location: [facility] ORS;  Service: Orthopedics;  Laterality: Right;  . RHINOPLASTY  1972  . THYROIDECTOMY  2006  . TOTAL KNEE ARTHROPLASTY     bilateral    Family History  Problem Relation Age of Onset  . Osteoarthritis Mother   . Diabetes Mother   . Cirrhosis Mother   . Cancer Father   . Heart disease Brother        stents in 1 brother    Social History   Social History  . Marital status: Married    Spouse name: N/A  . Number of children: 1  . Years of education: N/A   Occupational History  . Neurosurgeon     Retired   Social History Main Topics  . Smoking status: Former Smoker    Quit date: 12/22/1988  . Smokeless tobacco: Never Used  . Alcohol use No  . Drug use: No  . Sexual activity: Not on file   Other Topics Concern  . Not on file   Social History Narrative   1 daughter      Has living will   Husband has health care POA   Would allow resuscitation but no prolonged machines   Not sure about feeding tubes   Review of Systems Sleeping better now with adjustable bed--hasn't slept well since the shoulder surgery Appetite is okay Weight is up some due to immobility after the surgery Wears seat belt Teeth are okay--partials. Keeps up with dentist Bowels are fine. No blood. Overdue for mammogram No skin rash or ulcers No heartburn--occasional problems with tough meats    Objective:   Physical Exam  Constitutional: She is oriented to person, place, and time. She appears well-nourished. No distress.  HENT:  Mouth/Throat: Oropharynx is clear and moist.  No oropharyngeal exudate.  Neck: No thyromegaly present.  Cardiovascular: Normal rate, regular rhythm, normal heart sounds and intact distal pulses.  Exam reveals no gallop.   No murmur heard. Pulmonary/Chest: Effort normal. No respiratory distress. She has no wheezes. She has no rales.  Slightly decreased breath sounds but clear  Abdominal: Soft. There is no tenderness.  Musculoskeletal: She exhibits no edema or tenderness.  Lymphadenopathy:    She has no cervical adenopathy.  Neurological: She is alert and oriented to person, place, and time.  President--- "Megan Salon, Obama, Bush" [phone] [id number] Recall 3/3  Mild tremor in head mostly  Skin: No rash noted. No erythema.  Psychiatric: She has a normal mood and affect. Her behavior is normal.          Assessment & Plan:

## 2017-08-27 DIAGNOSIS — Z96611 Presence of right artificial shoulder joint: Secondary | ICD-10-CM | POA: Diagnosis not present

## 2017-08-27 DIAGNOSIS — M25511 Pain in right shoulder: Secondary | ICD-10-CM | POA: Diagnosis not present

## 2017-09-01 ENCOUNTER — Other Ambulatory Visit: Payer: Self-pay | Admitting: Internal Medicine

## 2017-09-02 DIAGNOSIS — Z96611 Presence of right artificial shoulder joint: Secondary | ICD-10-CM | POA: Diagnosis not present

## 2017-09-02 DIAGNOSIS — M25511 Pain in right shoulder: Secondary | ICD-10-CM | POA: Diagnosis not present

## 2017-09-07 ENCOUNTER — Ambulatory Visit: Payer: Self-pay | Admitting: Urology

## 2017-09-09 DIAGNOSIS — Z96611 Presence of right artificial shoulder joint: Secondary | ICD-10-CM | POA: Diagnosis not present

## 2017-09-09 DIAGNOSIS — M25511 Pain in right shoulder: Secondary | ICD-10-CM | POA: Diagnosis not present

## 2017-09-16 DIAGNOSIS — Z96611 Presence of right artificial shoulder joint: Secondary | ICD-10-CM | POA: Diagnosis not present

## 2017-09-16 DIAGNOSIS — M25511 Pain in right shoulder: Secondary | ICD-10-CM | POA: Diagnosis not present

## 2017-09-20 NOTE — Progress Notes (Signed)
09/21/2017 3:30 PM   Kari Medina Sep 25, 1946 [id number]  Referring provider: Venia Carbon, MD [address] [place], [place] [address]  Chief Complaint  Patient presents with  . New Patient (Initial Visit)    Urinary Incontinence referred by [name]    HPI: Patient is a 71 -year-old Caucasian female who is referred to [place] by, Dr. [name], for urinary incontinence.  Patient states that she has had urinary incontinence for several years.     Patient has incontinence with rising to a standing position from a seated position.   She is experiencing  incontinent episodes during the day. She is experiencing no incontinent episodes during the night.  Her incontinence volume is large through the pads.   She is wearing pads/depends constantly.  Two POISE pads daily.     She is having associated urinary urgency and nocturia x 1.   Her PVR today is 23 mL.    She does not have a history of urinary tract infections, STI's or injury to the bladder.     She denies dysuria, gross hematuria, suprapubic pain, back pain, abdominal pain or flank pain.  She has not had any recent fevers, chills, nausea or vomiting.   She does not have a history of nephrolithiasis, GU surgery or GU trauma.   She is not sexually active.     She is post menopausal.   She denies constipation and/or diarrhea.   She is not having pain with bladder filling.   She has not had any recent imaging studies.    She is drinking 4 to 5 bottles of water daily.   She is drinking 1 to 2 caffeinated beverages daily.  She is not drinking alcohol.    Her risk factors for incontinence are obesity, vaginal delivery, a family history of incontinence, age, caffeine, depression and vaginal atrophy.    She is taking antihistamines, decongestants, alpha-agonists, alpha blockers and diuretics.    Reviewed referral notes - wanted to see a specialist to see if she is a candidate for bladder tacking -  severely affecting her quality life  PMH: Past Medical History:  Diagnosis Date  . Asthma   . Depression   . Dyspnea    doe  . Dysrhythmia    extra beat  . Fatty liver   . Fibromyalgia   . Generalized osteoarthritis of multiple sites   . History of hiatal hernia   . Hypertension   . Hypothyroidism   . OSA (obstructive sleep apnea)   . Pain    chronic ruq and back pain  . Panic attacks   . Raynaud disease   . Raynaud disease   . RLS (restless legs syndrome)   . 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 PATIENT IS UNCERTAIN  . Tremor, essential     Surgical History: Past Surgical History:  Procedure Laterality Date  . CHOLECYSTECTOMY    . JOINT REPLACEMENT Bilateral    2008/2011  . REVERSE SHOULDER ARTHROPLASTY Right 05/28/2017   Procedure: REVERSE SHOULDER ARTHROPLASTY;  Surgeon: Corky Mull, MD;  Location: [facility] ORS;  Service: Orthopedics;  Laterality: Right;  . RHINOPLASTY  1972  . THYROIDECTOMY  2006  . TOTAL KNEE ARTHROPLASTY     bilateral    Home Medications:  Allergies as of 09/21/2017      Reactions   Duloxetine Other (See Comments)   Sleeping problems   Latex Hives   Nickel Hives  Pramipexole Other (See Comments)   Caused hallucinations, says he can take name brand.   Prednisone    Topiramate Other (See Comments)   "spaced out"   Citalopram Anxiety   Paroxetine Hcl Anxiety   Sertraline Anxiety      Medication List       Accurate as of 09/21/17  3:30 PM. Always use your most recent med list.          ACIDOPHILUS PO Take 1 capsule by mouth daily.   aspirin 325 MG EC tablet Take 81.25 mg by mouth daily.   b complex vitamins tablet Take 1 tablet by mouth daily.   BREO ELLIPTA 100-25 MCG/INH Aepb Generic drug:  fluticasone furoate-vilanterol Inhale 1 puff into the lungs at bedtime.   calcium carbonate 600 MG Tabs tablet Commonly known as:  OS-CAL Take 600 mg by mouth daily with breakfast.   cetirizine 10 MG  tablet Commonly known as:  ZYRTEC Take 10 mg by mouth daily.   cholecalciferol 1000 units tablet Commonly known as:  VITAMIN D Take 2,000 Units by mouth 2 (two) times daily.   diclofenac sodium 1 % Gel Commonly known as:  VOLTAREN Apply topically as needed.   fluticasone 50 MCG/ACT nasal spray Commonly known as:  FLONASE Place 1 spray into both nostrils daily as needed. For stuffy nose   hydrochlorothiazide 12.5 MG tablet Commonly known as:  HYDRODIURIL TAKE 1 TABLET(12.5 MG) BY MOUTH EVERY DAY   Magnesium 500 MG Tabs Take 500 mg by mouth 2 (two) times daily.   meloxicam 7.5 MG tablet Commonly known as:  MOBIC Take 7.5 mg by mouth daily.   mirabegron ER 25 MG Tb24 tablet Commonly known as:  MYRBETRIQ Take 1 tablet (25 mg total) by mouth daily.   montelukast 10 MG tablet Commonly known as:  SINGULAIR TAKE 1 TABLET(10 MG) BY MOUTH AT BEDTIME   Potassium 99 MG Tabs Take 99 mg by mouth daily.   propranolol 40 MG tablet Commonly known as:  INDERAL TAKE 1 TABLET BY MOUTH TWICE DAILY   rOPINIRole 2 MG tablet Commonly known as:  REQUIP TAKE 1 TABLET(2 MG) BY MOUTH EVERY NIGHT   SYNTHROID 200 MCG tablet Generic drug:  levothyroxine Take 1 tablet (200 mcg total) by mouth daily before breakfast.   traMADol 50 MG tablet Commonly known as:  ULTRAM Take 0.5-1 tablets (25-50 mg total) by mouth 3 (three) times daily as needed.   Turmeric Curcumin 500 MG Caps Take 500 mg by mouth at bedtime.   VENTOLIN HFA 108 (90 Base) MCG/ACT inhaler Generic drug:  albuterol Inhale 2 puffs into the lungs every 6 (six) hours as needed. For shortness of breath/wheezing   vitamin C 500 MG tablet Commonly known as:  ASCORBIC ACID Take 500 mg by mouth daily.       Allergies:  Allergies  Allergen Reactions  . Duloxetine Other (See Comments)    Sleeping problems  . Latex Hives  . Nickel Hives  . Pramipexole Other (See Comments)    Caused hallucinations, says he can take name  brand.  . Prednisone   . Topiramate Other (See Comments)    "spaced out"  . Citalopram Anxiety  . Paroxetine Hcl Anxiety  . Sertraline Anxiety    Family History: Family History  Problem Relation Age of Onset  . Osteoarthritis Mother   . Diabetes Mother   . Cirrhosis Mother   . Cancer Father   . Kidney cancer Father   . Bladder Cancer Father   .  Heart disease Brother        stents in 1 brother    Social History:  reports that she quit smoking about 28 years ago. She has never used smokeless tobacco. She reports that she does not drink alcohol or use drugs.  ROS: UROLOGY Frequent Urination?: No Hard to postpone urination?: Yes Burning/pain with urination?: No Get up at night to urinate?: Yes Leakage of urine?: Yes Urine stream starts and stops?: No Trouble starting stream?: No Do you have to strain to urinate?: No Blood in urine?: No Urinary tract infection?: Yes Sexually transmitted disease?: No Injury to kidneys or bladder?: No Painful intercourse?: No Weak stream?: No Currently pregnant?: No Vaginal bleeding?: No Last menstrual period?: n  Gastrointestinal Nausea?: No Vomiting?: No Indigestion/heartburn?: No Diarrhea?: No Constipation?: No  Constitutional Fever: No Night sweats?: No Weight loss?: No Fatigue?: Yes  Skin Skin rash/lesions?: No Itching?: No  Eyes Blurred vision?: No Double vision?: No  Ears/Nose/Throat Sore throat?: No Sinus problems?: No  Hematologic/Lymphatic Swollen glands?: No Easy bruising?: No  Cardiovascular Leg swelling?: No Chest pain?: No  Respiratory Cough?: No Shortness of breath?: Yes  Endocrine Excessive thirst?: No  Musculoskeletal Back pain?: Yes Joint pain?: Yes  Neurological Headaches?: No Dizziness?: No  Psychologic Depression?: No Anxiety?: Yes  Physical Exam: BP (!) 155/87   Pulse 69   Ht 5\' 6"  (1.676 m)   Wt 290 lb 1.6 oz (131.6 kg)   BMI 46.82 kg/m   Constitutional: Well  nourished. Alert and oriented, No acute distress. HEENT: [place] AT, moist mucus membranes. Trachea midline, no masses. Cardiovascular: No clubbing, cyanosis, or edema. Respiratory: Normal respiratory effort, no increased work of breathing. GI: Abdomen is soft, non tender, non distended, no abdominal masses. Liver and spleen not palpable.  No hernias appreciated.  Stool sample for occult testing is not indicated.   GU: No CVA tenderness.  No bladder fullness or masses.  Atrophic external genitalia, normal pubic hair distribution, no lesions.  Normal urethral meatus, no lesions, no prolapse, no discharge.   No urethral masses, tenderness and/or tenderness. No bladder fullness, tenderness or masses. Normal vagina mucosa, good estrogen effect, no discharge, no lesions, good pelvic support, Grade II cystocele is noted.  No rectocele noted.  Could not palpate cervix, uterus or adnexal/parametria due to body habitus. Anus and perineum are without rashes or lesions.    Skin: No rashes, bruises or suspicious lesions. Lymph: No cervical or inguinal adenopathy. Neurologic: Grossly intact, no focal deficits, moving all 4 extremities. Psychiatric: Normal mood and affect.  Laboratory Data: Lab Results  Component Value Date   WBC 22.9 (H) 05/29/2017   HGB 14.5 05/29/2017   HCT 43.3 05/29/2017   MCV 92.5 05/29/2017   PLT 240 05/29/2017    Lab Results  Component Value Date   CREATININE 0.93 05/29/2017    I have reviewed the labs.   Pertinent Imaging: Results for SH[place], MAINWARING (MRN [id number]) as of 09/21/2017 15:20  Ref. Range 09/21/2017 14:40  Scan Result Unknown 23   I have independently reviewed the films.    Assessment & Plan:    1. Mixed Incontinence  - offered behavioral therapies, bladder training, bladder control strategies and pelvic floor muscle training - patient deferred  - offered medical therapy with anticholinergic therapy or beta-3 adrenergic receptor agonist and the  potential side effects of each therapy   - offered refer to gynecology for a pessary fitting - patient would like referral  - would like to try  the beta-3 adrenergic receptor agonist (Myrbetriq).  Given Myrbetriq 25 mg samples, #28.  I have reviewed with the patient of the side effects of Myrbetriq, such as: elevation in BP, urinary retention and/or HA.    - RTC in 3 weeks for PVR and  OAB questionnaire  2. Vaginal atrophy  - Patient does not want to start a vaginal cream at this time  3. Cystocele  - Patient referred to gynecology to see if she is a candidate for a pessary   Return in about 3 weeks (around 10/12/2017) for PVR and OAB questionnaire.  These notes generated with voice recognition software. I apologize for typographical errors.  Zara Council, [facility] [address] [place], [place] [address] [phone]

## 2017-09-21 ENCOUNTER — Ambulatory Visit (INDEPENDENT_AMBULATORY_CARE_PROVIDER_SITE_OTHER): Payer: Medicare Other | Admitting: Urology

## 2017-09-21 ENCOUNTER — Encounter: Payer: Self-pay | Admitting: Urology

## 2017-09-21 VITALS — BP 155/87 | HR 69 | Ht 66.0 in | Wt 290.1 lb

## 2017-09-21 DIAGNOSIS — N8111 Cystocele, midline: Secondary | ICD-10-CM

## 2017-09-21 DIAGNOSIS — N3946 Mixed incontinence: Secondary | ICD-10-CM

## 2017-09-21 DIAGNOSIS — N952 Postmenopausal atrophic vaginitis: Secondary | ICD-10-CM

## 2017-09-21 LAB — BLADDER SCAN AMB NON-IMAGING: Scan Result: 23

## 2017-09-21 MED ORDER — MIRABEGRON ER 25 MG PO TB24: 25.0000 mg | ORAL_TABLET | Freq: Every day | ORAL | 0 refills | Status: DC

## 2017-09-23 DIAGNOSIS — Z96611 Presence of right artificial shoulder joint: Secondary | ICD-10-CM | POA: Diagnosis not present

## 2017-09-23 DIAGNOSIS — M25511 Pain in right shoulder: Secondary | ICD-10-CM | POA: Diagnosis not present

## 2017-10-01 DIAGNOSIS — M25511 Pain in right shoulder: Secondary | ICD-10-CM | POA: Diagnosis not present

## 2017-10-01 DIAGNOSIS — Z96611 Presence of right artificial shoulder joint: Secondary | ICD-10-CM | POA: Diagnosis not present

## 2017-10-07 DIAGNOSIS — M25511 Pain in right shoulder: Secondary | ICD-10-CM | POA: Diagnosis not present

## 2017-10-07 DIAGNOSIS — Z96611 Presence of right artificial shoulder joint: Secondary | ICD-10-CM | POA: Diagnosis not present

## 2017-10-12 DIAGNOSIS — Z96611 Presence of right artificial shoulder joint: Secondary | ICD-10-CM | POA: Diagnosis not present

## 2017-10-13 NOTE — Progress Notes (Deleted)
10/15/2017 10:11 PM   [name] 16-Oct-1946 [id number]  Referring provider: Venia Carbon, MD [place], [place] [address]  No chief complaint on file.   HPI: Patient is 71 year old Caucasian female who presents today for three-week follow-up for mixed incontinence, vaginal atrophy and cystocele after a trial of Myrbetriq.  Background history Patient is a 59 -year-old Caucasian female who is referred to [place] by, Dr. [name], for urinary incontinence.  Patient states that she has had urinary incontinence for several years.  Patient has incontinence with rising to a standing position from a seated position.   She is experiencing  incontinent episodes during the day. She is experiencing no incontinent episodes during the night.  Her incontinence volume is large through the pads.   She is wearing pads/depends constantly.  Two POISE pads daily.  She is having associated urinary urgency and nocturia x 1.   Her PVR today is 23 mL.  She does not have a history of urinary tract infections, STI's or injury to the bladder.   She denies dysuria, gross hematuria, suprapubic pain, back pain, abdominal pain or flank pain.  She has not had any recent fevers, chills, nausea or vomiting.  She does not have a history of nephrolithiasis, GU surgery or GU trauma.   She is not sexually active.   She is post menopausal.   She denies constipation and/or diarrhea.   She is not having pain with bladder filling.   She has not had any recent imaging studies.   She is drinking 4 to 5 bottles of water daily.   She is drinking 1 to 2 caffeinated beverages daily.  She is not drinking alcohol.  Her risk factors for incontinence are obesity, vaginal delivery, a family history of incontinence, age, caffeine, depression and vaginal atrophy.  She is taking antihistamines, decongestants, alpha-agonists, alpha blockers and diuretics.  Reviewed referral notes - wanted to see a specialist to see if  she is a candidate for bladder tacking - severely affecting her quality life.  Today, she is experiencing urgency x *** (***), frequency x *** (***), not/is restricting fluids to avoid visits to the restroom ***, not/is engaging in toilet mapping, incontinence x *** (***) and nocturia x *** (***).  Her PVR is ***.  Her BP is ***.    PMH: Past Medical History:  Diagnosis Date  . Asthma   . Depression   . Dyspnea    doe  . Dysrhythmia    extra beat  . Fatty liver   . Fibromyalgia   . Generalized osteoarthritis of multiple sites   . History of hiatal hernia   . Hypertension   . Hypothyroidism   . OSA (obstructive sleep apnea)   . Pain    chronic ruq and back pain  . Panic attacks   . Raynaud disease   . Raynaud disease   . RLS (restless legs syndrome)   . 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 PATIENT IS UNCERTAIN  . Tremor, essential     Surgical History: Past Surgical History:  Procedure Laterality Date  . CHOLECYSTECTOMY    . JOINT REPLACEMENT Bilateral    2008/2011  . REVERSE SHOULDER ARTHROPLASTY Right 05/28/2017   Procedure: REVERSE SHOULDER ARTHROPLASTY;  Surgeon: Corky Mull, MD;  Location: [facility] ORS;  Service: Orthopedics;  Laterality: Right;  . RHINOPLASTY  1972  . THYROIDECTOMY  2006  . TOTAL KNEE ARTHROPLASTY  bilateral    Home Medications:  Allergies as of 10/15/2017      Reactions   Duloxetine Other (See Comments)   Sleeping problems   Latex Hives   Nickel Hives   Pramipexole Other (See Comments)   Caused hallucinations, says he can take name brand.   Prednisone    Topiramate Other (See Comments)   "spaced out"   Citalopram Anxiety   Paroxetine Hcl Anxiety   Sertraline Anxiety      Medication List       Accurate as of 10/13/17 10:11 PM. Always use your most recent med list.          ACIDOPHILUS PO Take 1 capsule by mouth daily.   aspirin 325 MG EC tablet Take 81.25 mg by mouth daily.   b complex vitamins  tablet Take 1 tablet by mouth daily.   BREO ELLIPTA 100-25 MCG/INH Aepb Generic drug:  fluticasone furoate-vilanterol Inhale 1 puff into the lungs at bedtime.   calcium carbonate 600 MG Tabs tablet Commonly known as:  OS-CAL Take 600 mg by mouth daily with breakfast.   cetirizine 10 MG tablet Commonly known as:  ZYRTEC Take 10 mg by mouth daily.   cholecalciferol 1000 units tablet Commonly known as:  VITAMIN D Take 2,000 Units by mouth 2 (two) times daily.   diclofenac sodium 1 % Gel Commonly known as:  VOLTAREN Apply topically as needed.   fluticasone 50 MCG/ACT nasal spray Commonly known as:  FLONASE Place 1 spray into both nostrils daily as needed. For stuffy nose   hydrochlorothiazide 12.5 MG tablet Commonly known as:  HYDRODIURIL TAKE 1 TABLET(12.5 MG) BY MOUTH EVERY DAY   Magnesium 500 MG Tabs Take 500 mg by mouth 2 (two) times daily.   meloxicam 7.5 MG tablet Commonly known as:  MOBIC Take 7.5 mg by mouth daily.   mirabegron ER 25 MG Tb24 tablet Commonly known as:  MYRBETRIQ Take 1 tablet (25 mg total) by mouth daily.   montelukast 10 MG tablet Commonly known as:  SINGULAIR TAKE 1 TABLET(10 MG) BY MOUTH AT BEDTIME   Potassium 99 MG Tabs Take 99 mg by mouth daily.   propranolol 40 MG tablet Commonly known as:  INDERAL TAKE 1 TABLET BY MOUTH TWICE DAILY   rOPINIRole 2 MG tablet Commonly known as:  REQUIP TAKE 1 TABLET(2 MG) BY MOUTH EVERY NIGHT   SYNTHROID 200 MCG tablet Generic drug:  levothyroxine Take 1 tablet (200 mcg total) by mouth daily before breakfast.   traMADol 50 MG tablet Commonly known as:  ULTRAM Take 0.5-1 tablets (25-50 mg total) by mouth 3 (three) times daily as needed.   Turmeric Curcumin 500 MG Caps Take 500 mg by mouth at bedtime.   VENTOLIN HFA 108 (90 Base) MCG/ACT inhaler Generic drug:  albuterol Inhale 2 puffs into the lungs every 6 (six) hours as needed. For shortness of breath/wheezing   vitamin C 500 MG  tablet Commonly known as:  ASCORBIC ACID Take 500 mg by mouth daily.       Allergies:  Allergies  Allergen Reactions  . Duloxetine Other (See Comments)    Sleeping problems  . Latex Hives  . Nickel Hives  . Pramipexole Other (See Comments)    Caused hallucinations, says he can take name brand.  . Prednisone   . Topiramate Other (See Comments)    "spaced out"  . Citalopram Anxiety  . Paroxetine Hcl Anxiety  . Sertraline Anxiety    Family History: Family History  Problem Relation Age of Onset  . Osteoarthritis Mother   . Diabetes Mother   . Cirrhosis Mother   . Cancer Father   . Kidney cancer Father   . Bladder Cancer Father   . Heart disease Brother        stents in 1 brother    Social History:  reports that she quit smoking about 28 years ago. She has never used smokeless tobacco. She reports that she does not drink alcohol or use drugs.  ROS:                                        Physical Exam: There were no vitals taken for this visit.  Constitutional: Well nourished. Alert and oriented, No acute distress. HEENT: [place] AT, moist mucus membranes. Trachea midline, no masses. Cardiovascular: No clubbing, cyanosis, or edema. Respiratory: Normal respiratory effort, no increased work of breathing. Skin: No rashes, bruises or suspicious lesions. Lymph: No cervical or inguinal adenopathy. Neurologic: Grossly intact, no focal deficits, moving all 4 extremities. Psychiatric: Normal mood and affect.  Laboratory Data: Lab Results  Component Value Date   WBC 22.9 (H) 05/29/2017   HGB 14.5 05/29/2017   HCT 43.3 05/29/2017   MCV 92.5 05/29/2017   PLT 240 05/29/2017    Lab Results  Component Value Date   CREATININE 0.93 05/29/2017    I have reviewed the labs.   Pertinent Imaging: ***  I have independently reviewed the films.    Assessment & Plan:    1. Mixed Incontinence  - offered behavioral therapies, bladder training, bladder  control strategies and pelvic floor muscle training - patient deferred  - offered medical therapy with anticholinergic therapy or beta-3 adrenergic receptor agonist and the potential side effects of each therapy   - offered refer to gynecology for a pessary fitting - patient would like referral  - would like to try the beta-3 adrenergic receptor agonist (Myrbetriq).  Given Myrbetriq 25 mg samples, #28.  I have reviewed with the patient of the side effects of Myrbetriq, such as: elevation in BP, urinary retention and/or HA.    - RTC in 3 weeks for PVR and  OAB questionnaire  2. Vaginal atrophy  - Patient does not want to start a vaginal cream at this time  3. Cystocele  - Patient referred to gynecology to see if she is a candidate for a pessary   No Follow-up on file.  These notes generated with voice recognition software. I apologize for typographical errors.  Zara Council, [facility] [address], [place], [place] [address] [phone]

## 2017-10-15 ENCOUNTER — Ambulatory Visit: Payer: Medicare Other | Admitting: Urology

## 2017-10-18 ENCOUNTER — Other Ambulatory Visit: Payer: Self-pay | Admitting: Internal Medicine

## 2017-10-24 ENCOUNTER — Other Ambulatory Visit: Payer: Self-pay | Admitting: Internal Medicine

## 2017-10-27 DIAGNOSIS — H3554 Dystrophies primarily involving the retinal pigment epithelium: Secondary | ICD-10-CM | POA: Diagnosis not present

## 2017-10-27 DIAGNOSIS — H2513 Age-related nuclear cataract, bilateral: Secondary | ICD-10-CM | POA: Diagnosis not present

## 2017-11-05 DIAGNOSIS — J310 Chronic rhinitis: Secondary | ICD-10-CM | POA: Diagnosis not present

## 2017-11-05 DIAGNOSIS — J4540 Moderate persistent asthma, uncomplicated: Secondary | ICD-10-CM | POA: Diagnosis not present

## 2017-11-05 DIAGNOSIS — G4733 Obstructive sleep apnea (adult) (pediatric): Secondary | ICD-10-CM | POA: Diagnosis not present

## 2017-11-05 DIAGNOSIS — R0609 Other forms of dyspnea: Secondary | ICD-10-CM | POA: Diagnosis not present

## 2017-11-05 DIAGNOSIS — R05 Cough: Secondary | ICD-10-CM | POA: Diagnosis not present

## 2017-11-19 ENCOUNTER — Encounter: Payer: Medicare Other | Admitting: Obstetrics and Gynecology

## 2017-12-02 ENCOUNTER — Telehealth: Payer: Self-pay | Admitting: Internal Medicine

## 2017-12-02 NOTE — Telephone Encounter (Signed)
Copied from [place] [id number]. Topic: Quick Communication - See Telephone Encounter >> Dec 02, 2017  8:36 AM Oneta Rack wrote: CRM for notification. See Telephone encounter for:   12/02/17.Caller name: Vanhouten,Robert F Relation to pt: spouse  Call back number: [phone]  Pharmacy: [facility], [place] [phone] (Phone) [phone] (Fax)    Reason for call: patient declined appointment today due to the weather, patient experiencing chills, body ache and 101.3 last night, patient requesting clinical advice and Rx, please advise

## 2017-12-03 ENCOUNTER — Encounter: Payer: Self-pay | Admitting: Family Medicine

## 2017-12-03 ENCOUNTER — Ambulatory Visit (INDEPENDENT_AMBULATORY_CARE_PROVIDER_SITE_OTHER): Payer: Medicare Other | Admitting: Family Medicine

## 2017-12-03 ENCOUNTER — Ambulatory Visit: Payer: Self-pay | Admitting: *Deleted

## 2017-12-03 VITALS — BP 136/78 | HR 89 | Temp 98.6°F | Wt 293.0 lb

## 2017-12-03 DIAGNOSIS — Z96653 Presence of artificial knee joint, bilateral: Secondary | ICD-10-CM | POA: Insufficient documentation

## 2017-12-03 DIAGNOSIS — R509 Fever, unspecified: Secondary | ICD-10-CM

## 2017-12-03 DIAGNOSIS — R011 Cardiac murmur, unspecified: Secondary | ICD-10-CM | POA: Diagnosis not present

## 2017-12-03 DIAGNOSIS — Z96611 Presence of right artificial shoulder joint: Secondary | ICD-10-CM | POA: Diagnosis not present

## 2017-12-03 DIAGNOSIS — J439 Emphysema, unspecified: Secondary | ICD-10-CM | POA: Diagnosis not present

## 2017-12-03 LAB — POC URINALSYSI DIPSTICK (AUTOMATED)
Bilirubin, UA: NEGATIVE
Glucose, UA: NEGATIVE
Ketones, UA: NEGATIVE
Nitrite, UA: NEGATIVE
Protein, UA: NEGATIVE
Spec Grav, UA: 1.015 (ref 1.010–1.025)
Urobilinogen, UA: 0.2 E.U./dL
pH, UA: 6 (ref 5.0–8.0)

## 2017-12-03 LAB — POC INFLUENZA A&B (BINAX/QUICKVUE)
Influenza A, POC: NEGATIVE
Influenza B, POC: NEGATIVE

## 2017-12-03 MED ORDER — CEFTRIAXONE SODIUM 1 G IJ SOLR
1.0000 g | Freq: Once | INTRAMUSCULAR | Status: AC
  Administered 2017-12-03: 1 g via INTRAMUSCULAR

## 2017-12-03 MED ORDER — IPRATROPIUM BROMIDE 0.02 % IN SOLN
0.5000 mg | Freq: Once | RESPIRATORY_TRACT | Status: AC
  Administered 2017-12-03: 0.5 mg via RESPIRATORY_TRACT

## 2017-12-03 MED ORDER — ALBUTEROL SULFATE (2.5 MG/3ML) 0.083% IN NEBU
2.5000 mg | INHALATION_SOLUTION | Freq: Once | RESPIRATORY_TRACT | Status: AC
  Administered 2017-12-03: 2.5 mg via RESPIRATORY_TRACT

## 2017-12-03 MED ORDER — CIPROFLOXACIN HCL 500 MG PO TABS: 500.0000 mg | ORAL_TABLET | Freq: Two times a day (BID) | ORAL | 0 refills | Status: DC

## 2017-12-03 NOTE — Patient Instructions (Signed)
I think you may have kidney infection Nebulizer treatment today. Labs today Rocephin 1gm today.  Then start antibiotic sent to pharmacy Please return or seek further care if persistent fever, or not improving with treatment.

## 2017-12-03 NOTE — Telephone Encounter (Signed)
Pt has appt 12/03/17 at 3:30 with Dr Darnell Level.

## 2017-12-03 NOTE — Addendum Note (Signed)
Addended by: Brenton Grills on: 48/34/7583 06:51 PM   Modules accepted: Orders
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
## 2017-12-03 NOTE — Progress Notes (Addendum)
BP 136/78 (BP Location: Left Arm, Patient Position: Sitting, Cuff Size: Large)   Pulse 89   Temp 98.6 F (37 C) (Oral)   Wt 293 lb (132.9 kg)   SpO2 90%   BMI 47.29 kg/m    CC: fever Subjective:    Patient ID: Kari Medina, female    DOB: 10/16/1946, 71 y.o.   MRN: [id number]  HPI: Kari Medina is a 71 y.o. female presenting on 12/03/2017 for Fever (for 2 days. Was 101.3 at home. Has had chills and low back pain) and Elevated blood sugar (BS seemed to be elevated this morning)   2d h/o night time fevers/chills Tmax 101.3. Last night had emesis x1 with nausea. Does feel better during the day but then symptoms worsen at night time. Malaise, fatigue, staying in bed all day for last 2 days. Back, R flank and R abdominal discomfort also started today - described as dull achey pain -abdominal pain has been present for months, acutely worse the last 2 days. She has seen [facility] for this - told due to fatty liver. Frontal headache, body aches. Increased dyspnea.   Denies ear or tooth pain, ST, new rashes, diarrhea or constipation, blood in stool or urine, dysuria. Denies falls/injury.   Known asthma/COPD has noticed wheezing with mild cough that is improved with nebulizer treatment.  She has had bilateral knee and R shoulder replacements.   No sick contacts at home.  No new foods, medicines, OTC supplements.  Last night she did take a dose of amoxicillin 271m she had leftover at home   Relevant past medical, surgical, family and social history reviewed and updated as indicated. Interim medical history since our last visit reviewed. Allergies and medications reviewed and updated. Outpatient Medications Prior to Visit  Medication Sig Dispense Refill  . aspirin 325 MG EC tablet Take 81.25 mg by mouth daily.    .Marland Kitchenb complex vitamins tablet Take 1 tablet by mouth daily.      .Marland KitchenBREO ELLIPTA 100-25 MCG/INH AEPB Inhale 1 puff into the lungs at bedtime.  5  . calcium  carbonate (OS-CAL) 600 MG TABS Take 600 mg by mouth daily with breakfast.     . cetirizine (ZYRTEC) 10 MG tablet Take 10 mg by mouth daily.    . cholecalciferol (VITAMIN D) 1000 units tablet Take 2,000 Units by mouth 2 (two) times daily.    . diclofenac sodium (VOLTAREN) 1 % GEL Apply topically as needed.    . fluticasone (FLONASE) 50 MCG/ACT nasal spray Place 1 spray into both nostrils daily as needed. For stuffy nose  3  . hydrochlorothiazide (HYDRODIURIL) 12.5 MG tablet TAKE 1 TABLET(12.5 MG) BY MOUTH EVERY DAY 30 tablet 11  . Lactobacillus (ACIDOPHILUS PO) Take 1 capsule by mouth daily.    . Magnesium 500 MG TABS Take 500 mg by mouth 2 (two) times daily.    . meloxicam (MOBIC) 7.5 MG tablet Take 7.5 mg by mouth daily.    . montelukast (SINGULAIR) 10 MG tablet TAKE 1 TABLET(10 MG) BY MOUTH AT BEDTIME 90 tablet 3  . Potassium 99 MG TABS Take 99 mg by mouth daily.    . propranolol (INDERAL) 40 MG tablet TAKE 1 TABLET BY MOUTH TWICE DAILY 60 tablet 11  . rOPINIRole (REQUIP) 2 MG tablet TAKE 1 TABLET(2 MG) BY MOUTH EVERY NIGHT 90 tablet 3  . SYNTHROID 200 MCG tablet TAKE 1 TABLET(200 MCG) BY MOUTH DAILY BEFORE BREAKFAST 90 tablet 3  . traMADol (  ULTRAM) 50 MG tablet Take 0.5-1 tablets (25-50 mg total) by mouth 3 (three) times daily as needed. 60 tablet 0  . Turmeric Curcumin 500 MG CAPS Take 500 mg by mouth at bedtime.     . VENTOLIN HFA 108 (90 Base) MCG/ACT inhaler Inhale 2 puffs into the lungs every 6 (six) hours as needed. For shortness of breath/wheezing  11  . vitamin C (ASCORBIC ACID) 500 MG tablet Take 500 mg by mouth daily.    . mirabegron ER (MYRBETRIQ) 25 MG TB24 tablet Take 1 tablet (25 mg total) by mouth daily. 30 tablet 0   No facility-administered medications prior to visit.      Per HPI unless specifically indicated in ROS section below Review of Systems     Objective:    BP 136/78 (BP Location: Left Arm, Patient Position: Sitting, Cuff Size: Large)   Pulse 89   Temp 98.6  F (37 C) (Oral)   Wt 293 lb (132.9 kg)   SpO2 90%   BMI 47.29 kg/m   Wt Readings from Last 3 Encounters:  12/03/17 293 lb (132.9 kg)  09/21/17 290 lb 1.6 oz (131.6 kg)  08/20/17 287 lb (130.2 kg)    Physical Exam  Constitutional: She appears well-developed and well-nourished. No distress.  HENT:  Head: Normocephalic and atraumatic.  Mouth/Throat: Mucous membranes are dry. No oropharyngeal exudate.  Eyes: Conjunctivae and EOM are normal. Pupils are equal, round, and reactive to light.  Neck: Normal range of motion. Neck supple.  Cardiovascular: Normal rate, regular rhythm and intact distal pulses.  Murmur (faint 2/6 systolic) heard. Pulmonary/Chest: Effort normal. No respiratory distress. She has wheezes. She has no rales.  Musculoskeletal: She exhibits no edema.  Skin: Skin is warm and dry. No rash noted.  Psychiatric: She has a normal mood and affect.  Nursing note and vitals reviewed.  Results for orders placed or performed in visit on 12/03/17  POC Influenza A&B(BINAX/QUICKVUE)  Result Value Ref Range   Influenza A, POC Negative Negative   Influenza B, POC Negative Negative  POCT Urinalysis Dipstick (Automated)  Result Value Ref Range   Color, UA yellow    Clarity, UA clear    Glucose, UA negative    Bilirubin, UA negative    Ketones, UA negative    Spec Grav, UA 1.015 1.010 - 1.025   Blood, UA trace    pH, UA 6.0 5.0 - 8.0   Protein, UA negative    Urobilinogen, UA 0.2 0.2 or 1.0 E.U./dL   Nitrite, UA negative    Leukocytes, UA Large (3+) (A) Negative   Lab Results  Component Value Date   CREATININE 0.93 05/29/2017       Assessment & Plan:   Problem List Items Addressed This Visit    COPD (chronic obstructive pulmonary disease) with emphysema (HCC)    Some wheezing and tight air movement on exam - improved after albuterol/neb in office. O2 sat on repeat testing was 95% RA. She takes breo and duonebs at home.       Relevant Medications   albuterol  (PROVENTIL) (2.5 MG/3ML) 0.083% nebulizer solution 2.5 mg (Completed)   ipratropium (ATROVENT) nebulizer solution 0.5 mg (Completed)   Fever - Primary    Endorses 2 nights of fevers >101 associated with R flank pain, back pain, vomiting x1, and nonspecific sxs of malaise and fatigue. Flu swab negative today. UA/micro suspicious for infection. With symptoms, will treat possible pyelo with rocephin 1gm IM, then start cipro 536m bid  x 7d course.  Check CBC, CMP, ESR.  Given mild murmur heard today and pt states this is new, and h/o hardware, check blood cultures as well today.       Relevant Medications   albuterol (PROVENTIL) (2.5 MG/3ML) 0.083% nebulizer solution 2.5 mg (Completed)   ipratropium (ATROVENT) nebulizer solution 0.5 mg (Completed)   Other Relevant Orders   POC Influenza A&B(BINAX/QUICKVUE) (Completed)   POCT Urinalysis Dipstick (Automated) (Completed)   Urine Culture   Comprehensive metabolic panel   CBC with Differential/Platelet   Sedimentation rate   Culture, blood (single) w Reflex to ID Panel   Status post reverse total shoulder replacement, right    Other Visit Diagnoses    Systolic murmur          ADDENDUM ==> unable to draw blood despite over an hour attempting. Gave rocephin shot and I asked her to return for [facility] tomorrow after she drinks plenty of water tonight to ensure good hydration. Will cancel blood culture as it will be on rocephin/ciprofloxacin.   Follow up plan: Return if symptoms worsen or fail to improve.  Ria Bush, MD

## 2017-12-03 NOTE — Assessment & Plan Note (Addendum)
Some wheezing and tight air movement on exam - improved after albuterol/neb in office. O2 sat on repeat testing was 95% RA. She takes breo and duonebs at home.

## 2017-12-03 NOTE — Assessment & Plan Note (Addendum)
Endorses 2 nights of fevers >101 associated with R flank pain, back pain, vomiting x1, and nonspecific sxs of malaise and fatigue. Flu swab negative today. UA/micro suspicious for infection. With symptoms, will treat possible pyelo with rocephin 1gm IM, then start cipro 568m bid x 7d course.  Check CBC, CMP, ESR.  Given mild murmur heard today and pt states this is new, and h/o hardware, check blood cultures as well today.

## 2017-12-04 ENCOUNTER — Other Ambulatory Visit: Payer: Medicare Other

## 2017-12-04 ENCOUNTER — Telehealth: Payer: Self-pay | Admitting: Radiology

## 2017-12-04 LAB — COMPREHENSIVE METABOLIC PANEL
ALT: 17 U/L (ref 0–35)
AST: 20 U/L (ref 0–37)
Albumin: 3.4 g/dL — ABNORMAL LOW (ref 3.5–5.2)
Alkaline Phosphatase: 88 U/L (ref 39–117)
BUN: 19 mg/dL (ref 6–23)
CO2: 28 mEq/L (ref 19–32)
Calcium: 9.5 mg/dL (ref 8.4–10.5)
Chloride: 99 mEq/L (ref 96–112)
Creatinine, Ser: 1.04 mg/dL (ref 0.40–1.20)
GFR: 55.79 mL/min — ABNORMAL LOW (ref >60.00)
Glucose, Bld: 144 mg/dL — ABNORMAL HIGH (ref 70–99)
Potassium: 3.5 mEq/L (ref 3.5–5.1)
Sodium: 134 mEq/L — ABNORMAL LOW (ref 135–145)
Total Bilirubin: 1.1 mg/dL (ref 0.2–1.2)
Total Protein: 7.1 g/dL (ref 6.0–8.3)

## 2017-12-04 LAB — CBC WITH DIFFERENTIAL/PLATELET
Basophils Absolute: 0.1 10*3/uL (ref 0.0–0.1)
Basophils Relative: 0.3 % (ref 0.0–3.0)
Eosinophils Absolute: 0.1 10*3/uL (ref 0.0–0.7)
Eosinophils Relative: 0.6 % (ref 0.0–5.0)
HCT: 44.3 % (ref 36.0–46.0)
Hemoglobin: 14.6 g/dL (ref 12.0–15.0)
Lymphocytes Relative: 10.6 % — ABNORMAL LOW (ref 12.0–46.0)
Lymphs Abs: 2 10*3/uL (ref 0.7–4.0)
MCHC: 32.9 g/dL (ref 30.0–36.0)
MCV: 93.5 fl (ref 78.0–100.0)
Monocytes Absolute: 2.9 10*3/uL — ABNORMAL HIGH (ref 0.1–1.0)
Monocytes Relative: 15.5 % — ABNORMAL HIGH (ref 3.0–12.0)
Neutro Abs: 13.7 10*3/uL — ABNORMAL HIGH (ref 1.4–7.7)
Neutrophils Relative %: 73 % (ref 43.0–77.0)
Platelets: 166 10*3/uL (ref 150.0–400.0)
RBC: 4.73 Mil/uL (ref 3.87–5.11)
RDW: 15.6 % — ABNORMAL HIGH (ref 11.5–15.5)
WBC: 18.7 10*3/uL (ref 4.0–10.5)

## 2017-12-04 LAB — URINE CULTURE
MICRO NUMBER:: 81402699
SPECIMEN QUALITY:: ADEQUATE

## 2017-12-04 LAB — SEDIMENTATION RATE: Sed Rate: 101 mm/hr — ABNORMAL HIGH (ref 0–30)

## 2017-12-04 NOTE — Telephone Encounter (Signed)
[facility] lab called a critical, WBC - 18.7. Results given to Dr Danise Mina

## 2017-12-04 NOTE — Addendum Note (Signed)
Addended by: Ellamae Sia on: 12/04/2017 07:31 AM   Modules accepted: Orders

## 2017-12-11 NOTE — Telephone Encounter (Signed)
Patient was seen 12/03/2017

## 2017-12-24 DIAGNOSIS — Z6841 Body Mass Index (BMI) 40.0 and over, adult: Secondary | ICD-10-CM | POA: Diagnosis not present

## 2017-12-24 DIAGNOSIS — R0602 Shortness of breath: Secondary | ICD-10-CM | POA: Diagnosis not present

## 2017-12-24 DIAGNOSIS — G4733 Obstructive sleep apnea (adult) (pediatric): Secondary | ICD-10-CM | POA: Diagnosis not present

## 2017-12-24 DIAGNOSIS — J4540 Moderate persistent asthma, uncomplicated: Secondary | ICD-10-CM | POA: Diagnosis not present

## 2017-12-24 DIAGNOSIS — R05 Cough: Secondary | ICD-10-CM | POA: Diagnosis not present

## 2018-03-02 ENCOUNTER — Other Ambulatory Visit: Payer: Self-pay | Admitting: Internal Medicine

## 2018-03-02 ENCOUNTER — Ambulatory Visit (INDEPENDENT_AMBULATORY_CARE_PROVIDER_SITE_OTHER): Payer: Medicare Other | Admitting: Internal Medicine

## 2018-03-02 ENCOUNTER — Encounter: Payer: Self-pay | Admitting: Internal Medicine

## 2018-03-02 VITALS — BP 138/82 | HR 90 | Temp 98.0°F | Wt 283.0 lb

## 2018-03-02 DIAGNOSIS — F39 Unspecified mood [affective] disorder: Secondary | ICD-10-CM

## 2018-03-02 DIAGNOSIS — M159 Polyosteoarthritis, unspecified: Secondary | ICD-10-CM | POA: Diagnosis not present

## 2018-03-02 MED ORDER — DULOXETINE HCL 30 MG PO CPEP: 30.0000 mg | ORAL_CAPSULE | Freq: Every day | ORAL | 3 refills | Status: DC

## 2018-03-02 NOTE — Progress Notes (Signed)
Subjective:    Patient ID: Kari Medina, female    DOB: Nov 21, 1946, 72 y.o.   MRN: [id number]  HPI Here due to ongoing pain issues "all over my body" Knees are bad ?sciatica ----left buttock and down the side of her leg  Shoulder is "fine" since the surgery--unless she sleeps funny  Sometimes has pain in elbows Often in knees--despite TKR  Back pain almost every day Wonders if she has fibromyalgia---sister was on cymbalta and it felt it helped (tried this before and it affected her sleep) Feels she is depressed due to the pain Can't even get her housework done Exhausted after just taking a bath  Does take 1/4 tramadol at night with tylenol--this helps Doesn't take it during the day  Current Outpatient Medications on File Prior to Visit  Medication Sig Dispense Refill  . aspirin 325 MG EC tablet Take 81.25 mg by mouth daily.    Marland Kitchen b complex vitamins tablet Take 1 tablet by mouth daily.      Marland Kitchen BREO ELLIPTA 100-25 MCG/INH AEPB Inhale 1 puff into the lungs at bedtime.  5  . calcium carbonate (OS-CAL) 600 MG TABS Take 600 mg by mouth daily with breakfast.     . cetirizine (ZYRTEC) 10 MG tablet Take 10 mg by mouth daily.    . cholecalciferol (VITAMIN D) 1000 units tablet Take 2,000 Units by mouth 2 (two) times daily.    . fluticasone (FLONASE) 50 MCG/ACT nasal spray Place 1 spray into both nostrils daily as needed. For stuffy nose  3  . hydrochlorothiazide (HYDRODIURIL) 12.5 MG tablet TAKE 1 TABLET(12.5 MG) BY MOUTH EVERY DAY 30 tablet 11  . Lactobacillus (ACIDOPHILUS PO) Take 1 capsule by mouth daily.    . Magnesium 500 MG TABS Take 500 mg by mouth 2 (two) times daily.    . montelukast (SINGULAIR) 10 MG tablet TAKE 1 TABLET(10 MG) BY MOUTH AT BEDTIME 90 tablet 3  . Potassium 99 MG TABS Take 99 mg by mouth daily.    . propranolol (INDERAL) 40 MG tablet TAKE 1 TABLET BY MOUTH TWICE DAILY 60 tablet 11  . rOPINIRole (REQUIP) 2 MG tablet TAKE 1 TABLET(2 MG) BY MOUTH EVERY NIGHT  90 tablet 3  . SYNTHROID 200 MCG tablet TAKE 1 TABLET(200 MCG) BY MOUTH DAILY BEFORE BREAKFAST 90 tablet 3  . traMADol (ULTRAM) 50 MG tablet Take 0.5-1 tablets (25-50 mg total) by mouth 3 (three) times daily as needed. 60 tablet 0  . Turmeric Curcumin 500 MG CAPS Take 500 mg by mouth at bedtime.     . VENTOLIN HFA 108 (90 Base) MCG/ACT inhaler Inhale 2 puffs into the lungs every 6 (six) hours as needed. For shortness of breath/wheezing  11  . vitamin C (ASCORBIC ACID) 500 MG tablet Take 500 mg by mouth daily.     No current facility-administered medications on file prior to visit.     Allergies  Allergen Reactions  . Duloxetine Other (See Comments)    Sleeping problems  . Latex Hives  . Nickel Hives  . Pramipexole Other (See Comments)    Caused hallucinations, says he can take name brand.  . Prednisone   . Topiramate Other (See Comments)    "spaced out"  . Citalopram Anxiety  . Paroxetine Hcl Anxiety  . Sertraline Anxiety    Past Medical History:  Diagnosis Date  . Asthma   . Depression   . Dyspnea    doe  . Dysrhythmia    extra  beat  . Fatty liver   . Fibromyalgia   . Generalized osteoarthritis of multiple sites   . History of hiatal hernia   . Hypertension   . Hypothyroidism   . OSA (obstructive sleep apnea)   . Pain    chronic ruq and back pain  . Panic attacks   . Raynaud disease   . Raynaud disease   . RLS (restless legs syndrome)   . 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 PATIENT IS UNCERTAIN  . Tremor, essential     Past Surgical History:  Procedure Laterality Date  . CHOLECYSTECTOMY    . JOINT REPLACEMENT Bilateral    2008/2011  . REVERSE SHOULDER ARTHROPLASTY Right 05/28/2017   Procedure: REVERSE SHOULDER ARTHROPLASTY;  Surgeon: Corky Mull, MD;  Location: [facility] ORS;  Service: Orthopedics;  Laterality: Right;  . RHINOPLASTY  1972  . THYROIDECTOMY  2006  . TOTAL KNEE ARTHROPLASTY     bilateral    Family History  Problem  Relation Age of Onset  . Osteoarthritis Mother   . Diabetes Mother   . Cirrhosis Mother   . Cancer Father   . Kidney cancer Father   . Bladder Cancer Father   . Heart disease Brother        stents in 1 brother    Social History   Socioeconomic History  . Marital status: Married    Spouse name: Not on file  . Number of children: 1  . Years of education: Not on file  . Highest education level: Not on file  Social Needs  . Financial resource strain: Not on file  . Food insecurity - worry: Not on file  . Food insecurity - inability: Not on file  . Transportation needs - medical: Not on file  . Transportation needs - non-medical: Not on file  Occupational History  . Occupation: Neurosurgeon    Comment: Retired  Tobacco Use  . Smoking status: Former Smoker    Last attempt to quit: 12/22/1988    Years since quitting: 29.2  . Smokeless tobacco: Never Used  Substance and Sexual Activity  . Alcohol use: No  . Drug use: No  . Sexual activity: Not on file  Other Topics Concern  . Not on file  Social History Narrative   1 daughter      Has living will   Husband has health care POA   Would allow resuscitation but no prolonged machines   Not sure about feeding tubes   Review of Systems Sleeps great--as much as 10-12 hours Feels she is very anxious as well---worries she has a "parasite" infection (found "worm" stuck to her panty pad) Not out of the country since 1999 Ongoing issues with breathing--did have infection, then went to Dr Raul Del (and ongoing antibiotics/prednisone before finally improving) Didn't feel Dr Trudie Reed has helped her at all Ongoing problems with tremors--doesn't feel the propranolol is helping much    Objective:   Physical Exam  Constitutional: No distress.  Musculoskeletal:  Distal nodules on DIPs fingers Ulnar deviation right 2nd finger No other active joint swelling or synovitis  Psychiatric:  Anxious and some irrational fears (about  parasites, etc)          Assessment & Plan:

## 2018-03-02 NOTE — Telephone Encounter (Signed)
Copied from [place]. Topic: Quick Communication - Rx Refill/Question >> Mar 02, 2018  2:24 PM Neva Seat wrote: Tramadol 50 mg  Pt is out. Needing this refilled.  [facility] [place], [place] - [address] [place] [address] Phone: [phone] Fax: [phone]

## 2018-03-02 NOTE — Patient Instructions (Signed)
Please take the tylenol arthritis three times a day--every day. Take 1/4 tramadol along with this. Try the cymbalta as well (duloxetine)

## 2018-03-02 NOTE — Assessment & Plan Note (Signed)
Ongoing pain issues Reluctant to take medications Asked her to take tylenol arthritis tid regularly And can take the tramadol 1/4 tid as well

## 2018-03-02 NOTE — Assessment & Plan Note (Signed)
Depression (but not MDD) related to pain and disability Also anxiety about her health--not helped by her internet searches Will try the duloxetine at her request

## 2018-03-03 MED ORDER — TRAMADOL HCL 50 MG PO TABS
25.0000 mg | ORAL_TABLET | Freq: Three times a day (TID) | ORAL | 0 refills | Status: DC | PRN
Start: 2018-03-03 — End: 2018-10-11

## 2018-03-03 NOTE — Telephone Encounter (Signed)
Tramadol - controlled substance  LOV 03/02/18 with Silvio Pate  [facility] [id number] in [place], [place]

## 2018-03-10 ENCOUNTER — Other Ambulatory Visit: Payer: Self-pay | Admitting: Internal Medicine

## 2018-03-10 DIAGNOSIS — Z1231 Encounter for screening mammogram for malignant neoplasm of breast: Secondary | ICD-10-CM

## 2018-03-11 DIAGNOSIS — R9389 Abnormal findings on diagnostic imaging of other specified body structures: Secondary | ICD-10-CM | POA: Diagnosis not present

## 2018-03-11 DIAGNOSIS — J449 Chronic obstructive pulmonary disease, unspecified: Secondary | ICD-10-CM | POA: Diagnosis not present

## 2018-03-11 DIAGNOSIS — Z6841 Body Mass Index (BMI) 40.0 and over, adult: Secondary | ICD-10-CM | POA: Diagnosis not present

## 2018-03-11 DIAGNOSIS — R0602 Shortness of breath: Secondary | ICD-10-CM | POA: Diagnosis not present

## 2018-03-12 ENCOUNTER — Other Ambulatory Visit: Payer: Self-pay | Admitting: Specialist

## 2018-03-12 DIAGNOSIS — R0602 Shortness of breath: Secondary | ICD-10-CM

## 2018-03-26 ENCOUNTER — Ambulatory Visit
Admission: RE | Admit: 2018-03-26 | Discharge: 2018-03-26 | Disposition: A | Discharge status: Home / Self Care | Payer: Medicare Other | Source: Ambulatory Visit | Attending: Internal Medicine | Admitting: Internal Medicine

## 2018-03-26 DIAGNOSIS — Z1231 Encounter for screening mammogram for malignant neoplasm of breast: Secondary | ICD-10-CM | POA: Diagnosis not present

## 2018-03-31 ENCOUNTER — Ambulatory Visit
Admission: RE | Admit: 2018-03-31 | Discharge: 2018-03-31 | Disposition: A | Discharge status: Home / Self Care | Payer: Medicare Other | Source: Ambulatory Visit | Attending: Specialist | Admitting: Specialist

## 2018-03-31 DIAGNOSIS — R918 Other nonspecific abnormal finding of lung field: Secondary | ICD-10-CM | POA: Insufficient documentation

## 2018-03-31 DIAGNOSIS — I712 Thoracic aortic aneurysm, without rupture: Secondary | ICD-10-CM | POA: Insufficient documentation

## 2018-03-31 DIAGNOSIS — R0602 Shortness of breath: Secondary | ICD-10-CM

## 2018-04-06 DIAGNOSIS — J441 Chronic obstructive pulmonary disease with (acute) exacerbation: Secondary | ICD-10-CM | POA: Diagnosis not present

## 2018-04-06 DIAGNOSIS — R0602 Shortness of breath: Secondary | ICD-10-CM | POA: Diagnosis not present

## 2018-04-06 DIAGNOSIS — I712 Thoracic aortic aneurysm, without rupture: Secondary | ICD-10-CM | POA: Diagnosis not present

## 2018-04-07 ENCOUNTER — Ambulatory Visit (INDEPENDENT_AMBULATORY_CARE_PROVIDER_SITE_OTHER): Payer: Medicare Other | Admitting: Internal Medicine

## 2018-04-07 ENCOUNTER — Encounter: Payer: Self-pay | Admitting: Internal Medicine

## 2018-04-07 VITALS — BP 118/86 | HR 82 | Temp 97.9°F | Ht 66.0 in | Wt 278.0 lb

## 2018-04-07 DIAGNOSIS — F39 Unspecified mood [affective] disorder: Secondary | ICD-10-CM

## 2018-04-07 DIAGNOSIS — I712 Thoracic aortic aneurysm, without rupture, unspecified: Secondary | ICD-10-CM | POA: Insufficient documentation

## 2018-04-07 DIAGNOSIS — I7121 Aneurysm of the ascending aorta, without rupture: Secondary | ICD-10-CM | POA: Insufficient documentation

## 2018-04-07 MED ORDER — DULOXETINE HCL 60 MG PO CPEP: 60.0000 mg | ORAL_CAPSULE | Freq: Every day | ORAL | 3 refills | Status: DC

## 2018-04-07 NOTE — Assessment & Plan Note (Signed)
Clear improvement in pain and mood is better She feels her tremor is better also on the cymbalta Will try increasing to 60mg 

## 2018-04-07 NOTE — Assessment & Plan Note (Signed)
Should not need action soon But going to surgeon to arrange expected follow up

## 2018-04-07 NOTE — Progress Notes (Signed)
Subjective:    Patient ID: Kari Medina, female    DOB: [birth year], 72 y.o.   MRN: [id number]  HPI Here for follow up of chronic pain and mood issues With husband  Mood is clearly better Also feels her pain is improved Effect is not as good as she hoped--wonders about increasing the dose  Has not had to be regular with tylenol and tramadol Mostly takes it at bedtime to help her sleep  Discussed her visit with Dr Raul Del Had CT scan for nodule---will need follow up for this Also ascending thoracic aneurysm  4.5cm Being set up with Dr Earlene Plater  Current Outpatient Medications on File Prior to Visit  Medication Sig Dispense Refill  . acetaminophen (TYLENOL) 650 MG CR tablet Take 650 mg by mouth 3 (three) times daily.    Marland Kitchen aspirin 325 MG EC tablet Take 81.25 mg by mouth daily.    Marland Kitchen b complex vitamins tablet Take 1 tablet by mouth daily.      . Bacillus Coagulans-Inulin (PROBIOTIC FORMULA PO) Take by mouth.    Marland Kitchen BREO ELLIPTA 100-25 MCG/INH AEPB Inhale 1 puff into the lungs at bedtime.  5  . calcium carbonate (OS-CAL) 600 MG TABS Take 600 mg by mouth daily with breakfast.     . cetirizine (ZYRTEC) 10 MG tablet Take 10 mg by mouth daily.    . cholecalciferol (VITAMIN D) 1000 units tablet Take 2,000 Units by mouth 2 (two) times daily.    . DULoxetine (CYMBALTA) 30 MG capsule Take 1 capsule (30 mg total) by mouth daily. 30 capsule 3  . fluticasone (FLONASE) 50 MCG/ACT nasal spray Place 1 spray into both nostrils daily as needed. For stuffy nose  3  . hydrochlorothiazide (HYDRODIURIL) 12.5 MG tablet TAKE 1 TABLET(12.5 MG) BY MOUTH EVERY DAY 30 tablet 11  . Magnesium 500 MG TABS Take 500 mg by mouth 2 (two) times daily.    . montelukast (SINGULAIR) 10 MG tablet TAKE 1 TABLET(10 MG) BY MOUTH AT BEDTIME 90 tablet 3  . Potassium 99 MG TABS Take 99 mg by mouth daily.    . propranolol (INDERAL) 40 MG tablet TAKE 1 TABLET BY MOUTH TWICE DAILY 60 tablet 11  . rOPINIRole (REQUIP) 2 MG  tablet TAKE 1 TABLET(2 MG) BY MOUTH EVERY NIGHT 90 tablet 3  . SYNTHROID 200 MCG tablet TAKE 1 TABLET(200 MCG) BY MOUTH DAILY BEFORE BREAKFAST 90 tablet 3  . traMADol (ULTRAM) 50 MG tablet Take 0.5-1 tablets (25-50 mg total) by mouth 3 (three) times daily as needed. 60 tablet 0  . Turmeric Curcumin 500 MG CAPS Take 500 mg by mouth at bedtime.     . VENTOLIN HFA 108 (90 Base) MCG/ACT inhaler Inhale 2 puffs into the lungs every 6 (six) hours as needed. For shortness of breath/wheezing  11  . vitamin C (ASCORBIC ACID) 500 MG tablet Take 500 mg by mouth daily.     No current facility-administered medications on file prior to visit.     Allergies  Allergen Reactions  . Latex Hives  . Nickel Hives  . Pramipexole Other (See Comments)    Caused hallucinations, says he can take name brand.  . Prednisone   . Topiramate Other (See Comments)    "spaced out"  . Citalopram Anxiety  . Paroxetine Hcl Anxiety  . Sertraline Anxiety    Past Medical History:  Diagnosis Date  . Asthma   . Depression   . Dyspnea    doe  . Dysrhythmia  extra beat  . Fatty liver   . Fibromyalgia   . Generalized osteoarthritis of multiple sites   . History of hiatal hernia   . Hypertension   . Hypothyroidism   . OSA (obstructive sleep apnea)   . Pain    chronic ruq and back pain  . Panic attacks   . Raynaud disease   . Raynaud disease   . RLS (restless legs syndrome)   . 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 PATIENT IS UNCERTAIN  . Tremor, essential     Past Surgical History:  Procedure Laterality Date  . CHOLECYSTECTOMY    . JOINT REPLACEMENT Bilateral    2008/2011  . REVERSE SHOULDER ARTHROPLASTY Right 05/28/2017   Procedure: REVERSE SHOULDER ARTHROPLASTY;  Surgeon: Corky Mull, MD;  Location: [facility] ORS;  Service: Orthopedics;  Laterality: Right;  . RHINOPLASTY  1972  . THYROIDECTOMY  2006  . TOTAL KNEE ARTHROPLASTY     bilateral    Family History  Problem Relation Age of  Onset  . Osteoarthritis Mother   . Diabetes Mother   . Cirrhosis Mother   . Cancer Father   . Kidney cancer Father   . Bladder Cancer Father   . Heart disease Brother        stents in 1 brother  . Breast cancer Neg Hx     Social History   Socioeconomic History  . Marital status: Married    Spouse name: Not on file  . Number of children: 1  . Years of education: Not on file  . Highest education level: Not on file  Occupational History  . 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 Financial resource strain: Not on file  . Food insecurity:    Worry: Not on file    Inability: Not on file  . Transportation needs:    Medical: Not on file    Non-medical: Not on file  Tobacco Use  . Smoking status: Former Smoker    Last attempt to quit: 12/22/1988    Years since quitting: 29.3  . Smokeless tobacco: Never Used  Substance and Sexual Activity  . Alcohol use: No  . Drug use: No  . Sexual activity: Not on file  Lifestyle  . Physical activity:    Days per week: Not on file    Minutes per session: Not on file  . Stress: Not on file  Relationships  . Social connections:    Talks on phone: Not on file    Gets together: Not on file    Attends religious service: Not on file    Active member of club or organization: Not on file    Attends meetings of clubs or organizations: Not on file    Relationship status: Not on file  . Intimate partner violence:    Fear of current or ex partner: Not on file    Emotionally abused: Not on file    Physically abused: Not on file    Forced sexual activity: Not on file  Other Topics Concern  . Not on file  Social History Narrative   1 daughter      Has living will   Husband has health care POA   Would allow resuscitation but no prolonged machines   Not sure about feeding tubes   Review of Systems Appetite is okay Generally sleeping okay    Objective:   Physical Exam  Constitutional: She appears  well-developed. No distress.  Psychiatric: She has a normal mood and affect. Her behavior is normal.          Assessment & Plan:

## 2018-05-19 ENCOUNTER — Encounter: Payer: Self-pay | Admitting: Surgery

## 2018-05-19 ENCOUNTER — Other Ambulatory Visit: Payer: Self-pay

## 2018-05-19 ENCOUNTER — Institutional Professional Consult (permissible substitution) (INDEPENDENT_AMBULATORY_CARE_PROVIDER_SITE_OTHER): Payer: Medicare Other | Admitting: Surgery

## 2018-05-19 VITALS — BP 150/80 | HR 74 | Resp 24 | Ht 66.0 in | Wt 269.0 lb

## 2018-05-19 DIAGNOSIS — I7121 Aneurysm of the ascending aorta, without rupture: Secondary | ICD-10-CM

## 2018-05-19 DIAGNOSIS — I712 Thoracic aortic aneurysm, without rupture: Secondary | ICD-10-CM | POA: Diagnosis not present

## 2018-05-19 NOTE — Progress Notes (Signed)
Cardiothoracic Surgery Consultation  PCP is Venia Carbon, MD Referring Provider is Erby Pian, MD  Chief Complaint  Patient presents with  . Thoracic Aortic Aneurysm    new patient, Chest CT 03/31/2018    HPI:  The patient is a 72 year old woman with a history of degenerative arthritis status post bilateral total knee replacements and right shoulder replacement, obstructive sleep apnea, hypertension, hypothyroidism, fibromyalgia, and depression who has been followed by Dr. Silvio Pate.  She has also had persistent shortness of breath and has been followed by Dr. Raul Del with pulmonary medicine at [facility].  She said there is been some recent reduction in her pulmonary function testing.  The note from Dr. Raul Del said that her spirometry was decreased and it looks more like restrictive disease.  She had a CT scan of the chest on 03/31/2018 which showed a 10 x 14 mm nodule in the posterior aspect of the right lung apex as well as a 4 mm right upper lobe nodule with some subsegmental atelectasis in the lower lobes bilaterally as well as right middle lobe and lingula.  She was also noted to have an incidental 4.5 cm fusiform ascending aortic aneurysm.  She had a negative Lexiscan stress test with normal left ventricular systolic function and no evidence of reversible ischemia.  Past Medical History:  Diagnosis Date  . Asthma   . Depression   . Dyspnea    doe  . Dysrhythmia    extra beat  . Fatty liver   . Fibromyalgia   . Generalized osteoarthritis of multiple sites   . History of hiatal hernia   . Hypertension   . Hypothyroidism   . OSA (obstructive sleep apnea)   . Pain    chronic ruq and back pain  . Panic attacks   . Raynaud disease   . Raynaud disease   . RLS (restless legs syndrome)   . 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 PATIENT IS UNCERTAIN  . Tremor, essential     Past Surgical History:  Procedure Laterality Date  . CHOLECYSTECTOMY     . JOINT REPLACEMENT Bilateral    2008/2011  . REVERSE SHOULDER ARTHROPLASTY Right 05/28/2017   Procedure: REVERSE SHOULDER ARTHROPLASTY;  Surgeon: Corky Mull, MD;  Location: [facility] ORS;  Service: Orthopedics;  Laterality: Right;  . RHINOPLASTY  1972  . THYROIDECTOMY  2006  . TOTAL KNEE ARTHROPLASTY     bilateral    Family History  Problem Relation Age of Onset  . Osteoarthritis Mother   . Diabetes Mother   . Cirrhosis Mother   . Cancer Father   . Kidney cancer Father   . Bladder Cancer Father   . Heart disease Brother        stents in 1 brother  . Breast cancer Neg Hx     Social History Social History   Tobacco Use  . Smoking status: Former Smoker    Last attempt to quit: 12/22/1988    Years since quitting: 29.4  . Smokeless tobacco: Never Used  Substance Use Topics  . Alcohol use: No  . Drug use: No    Current Outpatient Medications  Medication Sig Dispense Refill  . acetaminophen (TYLENOL) 650 MG CR tablet Take 650 mg by mouth 3 (three) times daily.    Marland Kitchen aspirin 325 MG EC tablet Take 81.25 mg by mouth daily.    Marland Kitchen b complex vitamins tablet Take 1 tablet by mouth daily.      Marland Kitchen  Bacillus Coagulans-Inulin (PROBIOTIC FORMULA PO) Take by mouth.    Marland Kitchen BREO ELLIPTA 100-25 MCG/INH AEPB Inhale 1 puff into the lungs at bedtime.  5  . calcium carbonate (OS-CAL) 600 MG TABS Take 600 mg by mouth daily with breakfast.     . cetirizine (ZYRTEC) 10 MG tablet Take 10 mg by mouth daily.    . cholecalciferol (VITAMIN D) 1000 units tablet Take 2,000 Units by mouth 2 (two) times daily.    . DULoxetine (CYMBALTA) 60 MG capsule Take 1 capsule (60 mg total) by mouth daily. 90 capsule 3  . fluticasone (FLONASE) 50 MCG/ACT nasal spray Place 1 spray into both nostrils daily as needed. For stuffy nose  3  . hydrochlorothiazide (HYDRODIURIL) 12.5 MG tablet TAKE 1 TABLET(12.5 MG) BY MOUTH EVERY DAY 30 tablet 11  . Magnesium 500 MG TABS Take 500 mg by mouth 2 (two) times daily.    . montelukast  (SINGULAIR) 10 MG tablet TAKE 1 TABLET(10 MG) BY MOUTH AT BEDTIME 90 tablet 3  . Potassium 99 MG TABS Take 99 mg by mouth daily.    . propranolol (INDERAL) 40 MG tablet TAKE 1 TABLET BY MOUTH TWICE DAILY 60 tablet 11  . rOPINIRole (REQUIP) 2 MG tablet TAKE 1 TABLET(2 MG) BY MOUTH EVERY NIGHT 90 tablet 3  . SYNTHROID 200 MCG tablet TAKE 1 TABLET(200 MCG) BY MOUTH DAILY BEFORE BREAKFAST 90 tablet 3  . traMADol (ULTRAM) 50 MG tablet Take 0.5-1 tablets (25-50 mg total) by mouth 3 (three) times daily as needed. 60 tablet 0  . Turmeric Curcumin 500 MG CAPS Take 500 mg by mouth at bedtime.     . VENTOLIN HFA 108 (90 Base) MCG/ACT inhaler Inhale 2 puffs into the lungs every 6 (six) hours as needed. For shortness of breath/wheezing  11  . vitamin C (ASCORBIC ACID) 500 MG tablet Take 500 mg by mouth daily.     No current facility-administered medications for this visit.     Allergies  Allergen Reactions  . Latex Hives  . Nickel Hives  . Pramipexole Other (See Comments)    Caused hallucinations, says he can take name brand.  . Prednisone   . Topiramate Other (See Comments)    "spaced out"  . Citalopram Anxiety  . Paroxetine Hcl Anxiety  . Sertraline Anxiety    Review of Systems  Constitutional: Positive for activity change and fatigue.  Eyes:       Floaters  Respiratory: Positive for apnea and shortness of breath.        Orthopnea  Cardiovascular: Positive for chest pain and palpitations. Negative for leg swelling.  Gastrointestinal: Positive for abdominal pain.       Hiatal hernia  Endocrine: Negative.   Genitourinary: Negative.   Musculoskeletal: Positive for arthralgias, back pain and myalgias.  Skin: Negative.   Allergic/Immunologic: Negative.   Neurological: Positive for dizziness.  Hematological: Negative.   Psychiatric/Behavioral: The patient is nervous/anxious.     BP (!) 150/80 (BP Location: Left Arm, Patient Position: Sitting, Cuff Size: Large)   Pulse 74   Resp (!) 24    Ht 5\' 6"  (1.676 m)   Wt 269 lb (122 kg)   SpO2 93% Comment: RA  BMI 43.42 kg/m  Physical Exam  Constitutional: She is oriented to person, place, and time.  Obese woman in no distress  HENT:  Head: Normocephalic and atraumatic.  Mouth/Throat: Oropharynx is clear and moist.  Eyes: Pupils are equal, round, and reactive to light. Conjunctivae and EOM  are normal.  Neck: Normal range of motion. Neck supple. No JVD present.  Cardiovascular: Normal rate, regular rhythm and normal heart sounds. Exam reveals no gallop and no friction rub.  No murmur heard. Pulmonary/Chest: Effort normal and breath sounds normal. No respiratory distress. She has no wheezes. She has no rales.  Musculoskeletal: She exhibits no edema.  Lymphadenopathy:    She has no cervical adenopathy.  Neurological: She is alert and oriented to person, place, and time.  Skin: Skin is warm and dry.  Psychiatric: She has a normal mood and affect.     Diagnostic Tests:  CLINICAL DATA:  Worsening shortness of breath.  EXAM: CT CHEST WITHOUT CONTRAST  TECHNIQUE: Multidetector CT imaging of the chest was performed following the standard protocol without IV contrast.  COMPARISON:  01/07/2005  FINDINGS: Cardiovascular: Heart size upper normal. No substantial pericardial effusion. Atherosclerotic calcification is noted in the wall of the thoracic aorta. Ascending thoracic aorta measures up to 4.5 cm diameter.  Mediastinum/Nodes: Numerous small lymph nodes are seen in the mediastinum, similar to the study from 12 years ago. Index 8 mm right paratracheal lymph nodes seen image 47/2 with 7 mm on the prior study. 8 mm AP window lymph node (59/2) was 6 mm previously. No evidence for gross hilar lymphadenopathy although assessment is limited by the lack of intravenous contrast on today's study. The esophagus has normal imaging features. There is no axillary lymphadenopathy.  Lungs/Pleura: 10 x 14 mm nodule  identified posterior right lung apex (35/3). 4 mm right upper lobe nodule noted (60/3) subsegmental atelectasis noted in the right middle lobe, lingula, and both lower lobes. No focal airspace consolidation. No pleural effusion. Subtle areas of mosaic attenuation likely related air trapping.  Upper Abdomen: The liver shows diffusely decreased attenuation suggesting steatosis. No spleen is visualized in the left upper quadrant although there is a small mineralized soft tissue fragment (image 148/2) potentially representing autosplenectomy/atrophy.  Musculoskeletal: Status post right shoulder replacement. Bone windows reveal no worrisome lytic or sclerotic osseous lesions.  IMPRESSION: 1. No definite findings to explain the patient's history of shortness of breath. 2. 10 x 14 mm platelike opacity in the posterior right upper lobe may be atelectasis or scarring and has no overtly concerning features on today's exam. Repeat CT chest without contrast in 3 months recommended to assess stability. 3. 4 mm right upper lobe pulmonary nodule. Attention on follow-up recommended. 4. No spleen identified in the left upper quadrant of the abdomen. Question resection versus auto splenectomy. 5. Ascending thoracic aorta measures 4.5 cm diameter. Ascending thoracic aortic aneurysm. Recommend semi-annual imaging followup by CTA or MRA and referral to cardiothoracic surgery if not already obtained. This recommendation follows 2010 ACCF/AHA/AATS/ACR/ASA/SCA/SCAI/SIR/STS/SVM Guidelines for the Diagnosis and Management of Patients With Thoracic Aortic Disease. Circulation. 2010; 121: e266-e369 6. Upper normal mediastinal lymph nodes, not substantially changed in the long interval since prior study.   Electronically Signed   By: Misty Stanley M.D.   On: 04/01/2018 08:00   Impression:  This 72 year old woman has an incidental 4.5 cm fusiform ascending aortic aneurysm.  She had a CT scan of the  chest in 2006 but the report stated that her aorta was within normal limits.  Those films are no longer available.  This is well below the usual 5.5 cm threshold for surgical treatment.  I stressed the importance of good blood pressure control.  She is on a beta-blocker.  The CT scan also showed a 10 x 14 mm opacity  in the posterior aspect of the right upper lobe that could be atelectasis or scarring.  There is also a 4 mm nodule in the right upper lobe that is indeterminate.  She should have a follow-up CT scan of the chest in 3 months to evaluate these.  Her main complaint to me is of shortness of breath and fatigue.  She is being followed by pulmonary medicine and it sounds like her pulmonary function testing has shown some decrease in her spirometry and is consistent with restrictive lung disease which is not surprising given her weight.  I do not see any evidence of other lung pathology on CT scan.  She recently had a negative Lexiscan stress test showing normal left ventricular function and no reversible ischemia.  She has no murmur on exam and I suspect that her shortness of breath is probably related to her restrictive lung disease, obesity, degenerative arthritis, and deconditioning.  Plan:  I will see her back in 3 months with a CT scan of the chest without contrast to reevaluate the right upper lobe lung nodules.  Then I will schedule longer-term follow-up of her ascending aortic aneurysm in 1 year.   I spent 45 minutes performing this consultation and > 50% of this time was spent face to face counseling and coordinating the care of this patient's ascending aortic aneurysm and right upper lobe lung nodules   Gaye Pollack, MD [facility] [phone]

## 2018-06-01 ENCOUNTER — Other Ambulatory Visit: Payer: Self-pay | Admitting: Specialist

## 2018-06-01 DIAGNOSIS — R911 Solitary pulmonary nodule: Secondary | ICD-10-CM | POA: Diagnosis not present

## 2018-06-01 DIAGNOSIS — J4540 Moderate persistent asthma, uncomplicated: Secondary | ICD-10-CM | POA: Diagnosis not present

## 2018-06-11 IMAGING — MR MR SHOULDER*R* W/O CM
5 series · 40 of 40 positions shown · non-contrast
Comparison: None.

CLINICAL DATA: Injured shoulder 3 years ago increasing pain and
decreasing range of motion. Weakness.

EXAM:
MRI OF THE RIGHT SHOULDER WITHOUT CONTRAST
TECHNIQUE: Multiplanar, multisequence MR imaging of the shoulder was performed.
No intravenous contrast was administered.

[Series 4: T2 fat-sat · axial · 4.0mm · 0.59mm/px · z∈[-22,+61]mm · 8 of 20 slices shown (1 of 3)]
[im 1/20]
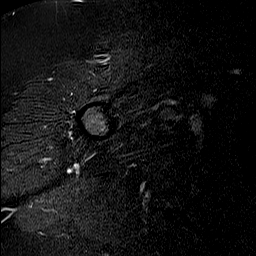
[im 3/20]
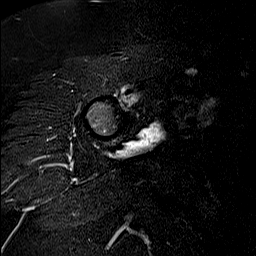
[im 6/20]
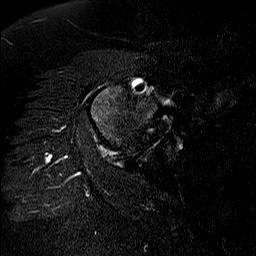
[im 9/20]
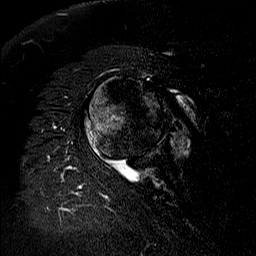
[im 11/20]
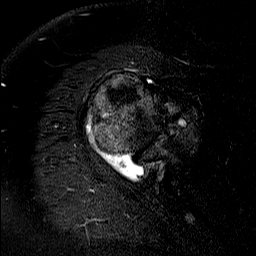
[im 14/20]
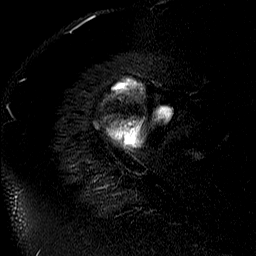
[im 17/20]
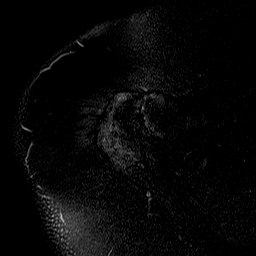
[im 20/20]
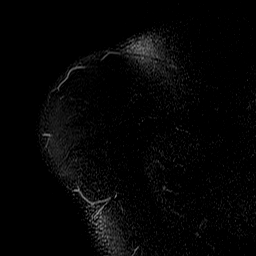

[Series 5: T2 fat-sat · oblique · 4.0mm · 0.59mm/px · 8 of 19 slices shown (2 of 3)]
[im 1/19]
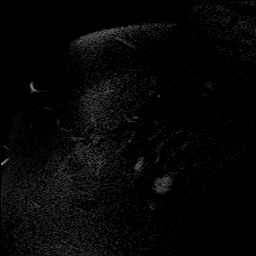
[im 3/19]
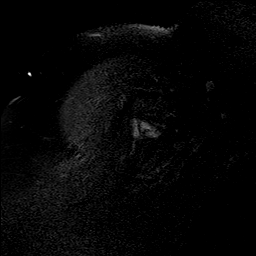
[im 6/19]
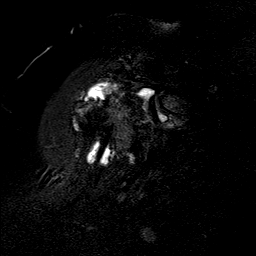
[im 8/19]
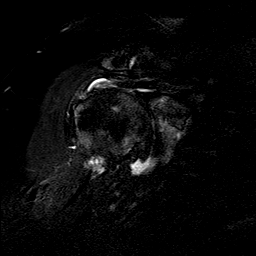
[im 11/19]
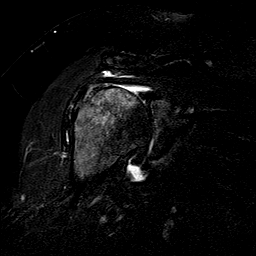
[im 13/19]
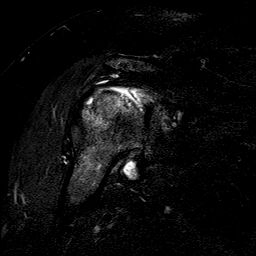
[im 16/19]
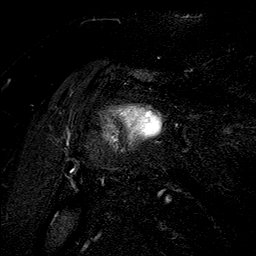
[im 19/19]
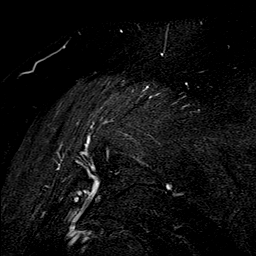

[Series 6: PD · oblique · 4.0mm · 0.59mm/px · 8 of 19 slices shown]
[im 1/19]
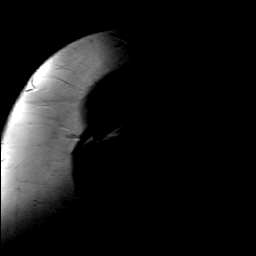
[im 3/19]
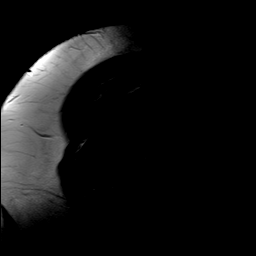
[im 6/19]
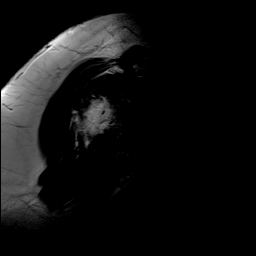
[im 8/19]
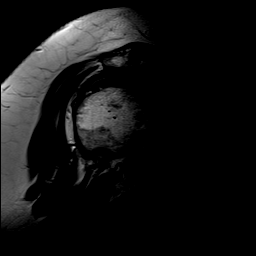
[im 11/19]
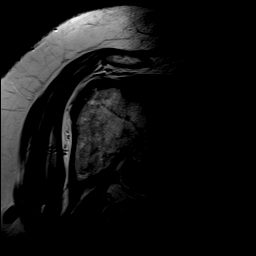
[im 13/19]
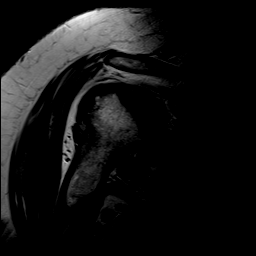
[im 16/19]
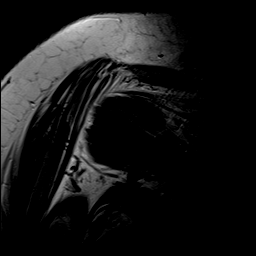
[im 19/19]
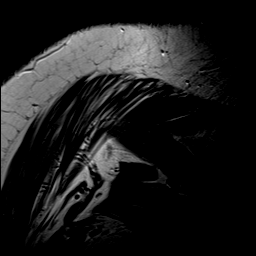

[Series 7: T1 · oblique · 4.0mm · 0.59mm/px · 8 of 20 slices shown]
[im 1/20]
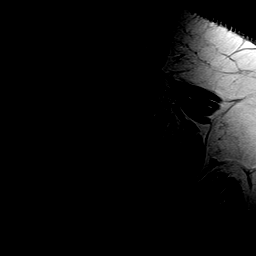
[im 3/20]
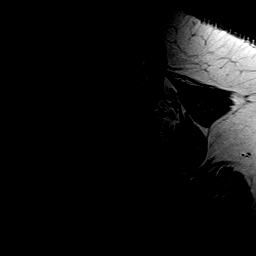
[im 6/20]
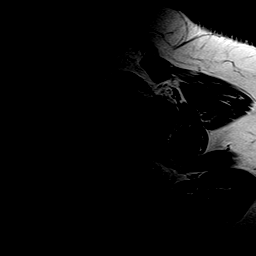
[im 9/20]
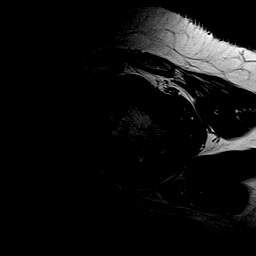
[im 11/20]
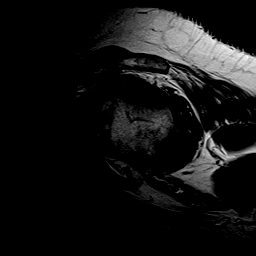
[im 14/20]
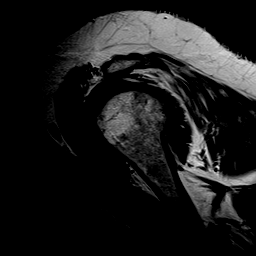
[im 17/20]
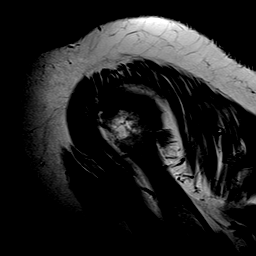
[im 20/20]
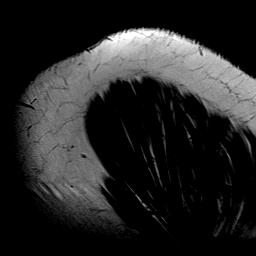

[Series 8: T2 fat-sat · oblique · 4.0mm · 0.59mm/px · 8 of 20 slices shown (3 of 3)]
[im 1/20]
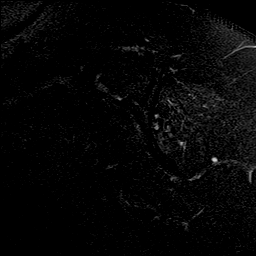
[im 3/20]
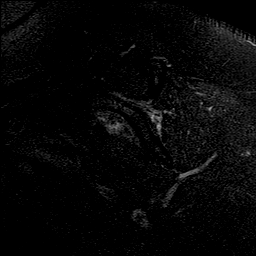
[im 6/20]
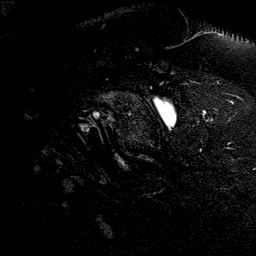
[im 9/20]
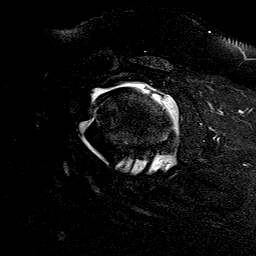
[im 11/20]
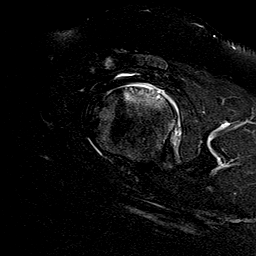
[im 14/20]
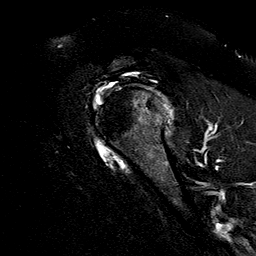
[im 17/20]
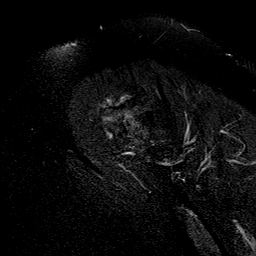
[im 20/20]
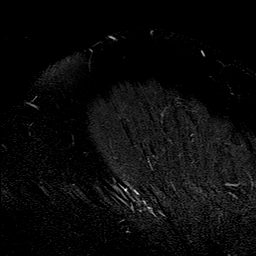

[40 of 40 positions shown; findings below may reference images not displayed]

FINDINGS: Rotator cuff: Small full-thickness rotator cuff tear involving the
supraspinatus tendon anteriorly. There is approximately 12.5 mm of
retraction and the tear is 9.5 mm wide. Associated non focal fluid
in the subacromial/subdeltoid bursa. The infraspinatus and
subscapularis tendons are intact.

Muscles:  Mild diffuse fatty atrophy of the shoulder musculature.

Biceps long head:  Intact.

Acromioclavicular Joint: Moderate degenerative changes Type 2-3
acromion. No lateral downsloping or undersurface spurring.

Glenohumeral Joint: Moderate to advanced degenerative changes with
joint space narrowing, cartilage loss, moderate-sized joint effusion
and synovitis.

Labrum:  Degenerated but not obviously torn.

Bones: Focal area of remote AVN involving the humeral head which is
slightly flattened and irregular. This measures approximately 22 mm.
No acute fracture or bone lesion.

Other: None
IMPRESSION: 1. Small full-thickness retracted supraspinous tendon tear
anteriorly.
2. Moderate to advanced glenohumeral joint degenerative changes with
moderate-sized joint effusion and synovitis.
3. Focal area of remote AVN involving the humeral head.

## 2018-06-30 ENCOUNTER — Ambulatory Visit
Admission: RE | Admit: 2018-06-30 | Discharge: 2018-06-30 | Disposition: A | Discharge status: Home / Self Care | Payer: Medicare Other | Source: Ambulatory Visit | Attending: Specialist | Admitting: Specialist

## 2018-06-30 DIAGNOSIS — R911 Solitary pulmonary nodule: Secondary | ICD-10-CM

## 2018-06-30 DIAGNOSIS — J984 Other disorders of lung: Secondary | ICD-10-CM | POA: Insufficient documentation

## 2018-06-30 DIAGNOSIS — R918 Other nonspecific abnormal finding of lung field: Secondary | ICD-10-CM | POA: Insufficient documentation

## 2018-06-30 DIAGNOSIS — R590 Localized enlarged lymph nodes: Secondary | ICD-10-CM | POA: Insufficient documentation

## 2018-07-01 DIAGNOSIS — D2271 Melanocytic nevi of right lower limb, including hip: Secondary | ICD-10-CM | POA: Diagnosis not present

## 2018-07-01 DIAGNOSIS — D2261 Melanocytic nevi of right upper limb, including shoulder: Secondary | ICD-10-CM | POA: Diagnosis not present

## 2018-07-01 DIAGNOSIS — C4371 Malignant melanoma of right lower limb, including hip: Secondary | ICD-10-CM | POA: Diagnosis not present

## 2018-07-01 DIAGNOSIS — L821 Other seborrheic keratosis: Secondary | ICD-10-CM | POA: Diagnosis not present

## 2018-07-01 DIAGNOSIS — D2262 Melanocytic nevi of left upper limb, including shoulder: Secondary | ICD-10-CM | POA: Diagnosis not present

## 2018-07-01 DIAGNOSIS — D485 Neoplasm of uncertain behavior of skin: Secondary | ICD-10-CM | POA: Diagnosis not present

## 2018-07-01 DIAGNOSIS — D2272 Melanocytic nevi of left lower limb, including hip: Secondary | ICD-10-CM | POA: Diagnosis not present

## 2018-07-01 DIAGNOSIS — D225 Melanocytic nevi of trunk: Secondary | ICD-10-CM | POA: Diagnosis not present

## 2018-07-02 ENCOUNTER — Other Ambulatory Visit: Payer: Self-pay | Admitting: Surgery

## 2018-07-02 DIAGNOSIS — R918 Other nonspecific abnormal finding of lung field: Secondary | ICD-10-CM

## 2018-07-07 DIAGNOSIS — Z6841 Body Mass Index (BMI) 40.0 and over, adult: Secondary | ICD-10-CM | POA: Diagnosis not present

## 2018-07-07 DIAGNOSIS — C4371 Malignant melanoma of right lower limb, including hip: Secondary | ICD-10-CM | POA: Diagnosis not present

## 2018-07-09 DIAGNOSIS — C4371 Malignant melanoma of right lower limb, including hip: Secondary | ICD-10-CM | POA: Diagnosis not present

## 2018-07-22 DIAGNOSIS — C439 Malignant melanoma of skin, unspecified: Secondary | ICD-10-CM

## 2018-07-22 DIAGNOSIS — Z85820 Personal history of malignant melanoma of skin: Secondary | ICD-10-CM | POA: Insufficient documentation

## 2018-07-22 HISTORY — DX: Malignant melanoma of skin, unspecified: C43.9

## 2018-07-22 HISTORY — PX: MELANOMA EXCISION: SHX5266

## 2018-07-27 DIAGNOSIS — J449 Chronic obstructive pulmonary disease, unspecified: Secondary | ICD-10-CM | POA: Diagnosis not present

## 2018-07-27 DIAGNOSIS — G4733 Obstructive sleep apnea (adult) (pediatric): Secondary | ICD-10-CM | POA: Diagnosis not present

## 2018-07-27 DIAGNOSIS — C4371 Malignant melanoma of right lower limb, including hip: Secondary | ICD-10-CM | POA: Diagnosis not present

## 2018-07-27 DIAGNOSIS — E039 Hypothyroidism, unspecified: Secondary | ICD-10-CM | POA: Diagnosis not present

## 2018-07-27 DIAGNOSIS — Z87891 Personal history of nicotine dependence: Secondary | ICD-10-CM | POA: Diagnosis not present

## 2018-07-27 DIAGNOSIS — I10 Essential (primary) hypertension: Secondary | ICD-10-CM | POA: Diagnosis not present

## 2018-07-29 ENCOUNTER — Encounter: Payer: Self-pay | Admitting: Internal Medicine

## 2018-08-11 DIAGNOSIS — C4371 Malignant melanoma of right lower limb, including hip: Secondary | ICD-10-CM | POA: Diagnosis not present

## 2018-08-11 DIAGNOSIS — Z09 Encounter for follow-up examination after completed treatment for conditions other than malignant neoplasm: Secondary | ICD-10-CM | POA: Diagnosis not present

## 2018-08-18 ENCOUNTER — Other Ambulatory Visit: Payer: Medicare Other

## 2018-08-18 ENCOUNTER — Ambulatory Visit: Payer: Medicare Other | Admitting: Surgery

## 2018-08-19 ENCOUNTER — Encounter: Payer: Self-pay | Admitting: Internal Medicine

## 2018-08-21 ENCOUNTER — Other Ambulatory Visit: Payer: Self-pay | Admitting: Internal Medicine

## 2018-08-24 ENCOUNTER — Other Ambulatory Visit: Payer: Self-pay | Admitting: Internal Medicine

## 2018-08-26 ENCOUNTER — Ambulatory Visit: Payer: Medicare Other | Admitting: Surgery

## 2018-08-27 ENCOUNTER — Ambulatory Visit (INDEPENDENT_AMBULATORY_CARE_PROVIDER_SITE_OTHER): Payer: Medicare Other | Admitting: Surgery

## 2018-08-27 ENCOUNTER — Encounter: Payer: Self-pay | Admitting: Surgery

## 2018-08-27 VITALS — BP 150/84 | HR 78 | Resp 20 | Ht 66.0 in | Wt 269.0 lb

## 2018-08-27 DIAGNOSIS — R918 Other nonspecific abnormal finding of lung field: Secondary | ICD-10-CM

## 2018-08-27 DIAGNOSIS — I712 Thoracic aortic aneurysm, without rupture: Secondary | ICD-10-CM

## 2018-08-27 DIAGNOSIS — I7121 Aneurysm of the ascending aorta, without rupture: Secondary | ICD-10-CM

## 2018-08-27 NOTE — Progress Notes (Signed)
HPI:  She is a 72 year old woman who returns today for follow-up of a 4.5 cm fusiform ascending aortic aneurysm and a 10 x 14 mm nodule in the posterior aspect of the right lung apex as well as a 4 mm right upper lobe nodule.  She has a history of degenerative arthritis status post bilateral total knee replacements and right shoulder replacement, obstructive sleep apnea, hypertension, hypothyroidism, fibromyalgia, and depression. I initially saw her on 05/19/2018 and felt that she should have a follow-up CT scan in about 3 months to check on these lung nodules.  She said that since I last saw her she was diagnosed with a melanoma on the posterior aspect of the right lower leg.  This was treated with excisional biopsy followed by wide local excision and sentinel lymph node sampling.  The pathology showed complete resection and negative lymph nodes. She also has persistent shortness of breath and has been followed by Dr. Raul Del with pulmonary medicine at [facility].  She had restrictive lung disease by spirometry.   Current Outpatient Medications  Medication Sig Dispense Refill  . acetaminophen (TYLENOL) 650 MG CR tablet Take 650 mg by mouth 3 (three) times daily.    Marland Kitchen aspirin 325 MG EC tablet Take 81.25 mg by mouth daily.    Marland Kitchen b complex vitamins tablet Take 1 tablet by mouth daily.      . Bacillus Coagulans-Inulin (PROBIOTIC FORMULA PO) Take by mouth.    Marland Kitchen BREO ELLIPTA 100-25 MCG/INH AEPB Inhale 1 puff into the lungs at bedtime.  5  . calcium carbonate (OS-CAL) 600 MG TABS Take 600 mg by mouth daily with breakfast.     . cetirizine (ZYRTEC) 10 MG tablet Take 10 mg by mouth daily.    . cholecalciferol (VITAMIN D) 1000 units tablet Take 2,000 Units by mouth 2 (two) times daily.    . DULoxetine (CYMBALTA) 60 MG capsule Take 1 capsule (60 mg total) by mouth daily. 90 capsule 3  . fluticasone (FLONASE) 50 MCG/ACT nasal spray Place 1 spray into both nostrils daily as needed. For stuffy nose  3   . hydrochlorothiazide (HYDRODIURIL) 12.5 MG tablet TAKE 1 TABLET(12.5 MG) BY MOUTH EVERY DAY 30 tablet 0  . Magnesium 500 MG TABS Take 500 mg by mouth 2 (two) times daily.    . montelukast (SINGULAIR) 10 MG tablet TAKE 1 TABLET(10 MG) BY MOUTH AT BEDTIME 90 tablet 0  . Potassium 99 MG TABS Take 99 mg by mouth daily.    . propranolol (INDERAL) 40 MG tablet TAKE 1 TABLET BY MOUTH TWICE DAILY 60 tablet 0  . rOPINIRole (REQUIP) 2 MG tablet TAKE 1 TABLET(2 MG) BY MOUTH EVERY NIGHT 90 tablet 3  . SYNTHROID 200 MCG tablet TAKE 1 TABLET(200 MCG) BY MOUTH DAILY BEFORE BREAKFAST 90 tablet 3  . traMADol (ULTRAM) 50 MG tablet Take 0.5-1 tablets (25-50 mg total) by mouth 3 (three) times daily as needed. 60 tablet 0  . Turmeric Curcumin 500 MG CAPS Take 500 mg by mouth at bedtime.     . VENTOLIN HFA 108 (90 Base) MCG/ACT inhaler Inhale 2 puffs into the lungs every 6 (six) hours as needed. For shortness of breath/wheezing  11  . vitamin C (ASCORBIC ACID) 500 MG tablet Take 500 mg by mouth daily.     No current facility-administered medications for this visit.      Physical Exam: BP (!) 150/84   Pulse 78   Resp 20   Ht 5'  6" (1.676 m)   Wt 269 lb (122 kg)   SpO2 96% Comment: RA  BMI 43.42 kg/m  She is in no distress. Lungs are clear.  Diagnostic Tests:  CLINICAL DATA:  Shortness of breath. Follow-up pulmonary nodules. No history of malignancy or prior relevant surgery.  EXAM: CT CHEST WITHOUT CONTRAST  TECHNIQUE: Multidetector CT imaging of the chest was performed following the standard protocol without IV contrast.  COMPARISON:  Chest CT 01/07/2005 and 03/31/2018.  FINDINGS: Cardiovascular: Mild atherosclerosis of the aorta, great vessels and coronary arteries. The ascending aorta measures approximately 4.3 cm in greatest dimension. There is stable mild central enlargement of the pulmonary arteries, suggesting pulmonary arterial hypertension. There is mild cardiomegaly, without  significant pericardial effusion.  Mediastinum/Nodes: Numerous prominent mediastinal and axillary lymph nodes are chronic and unchanged. Index AP window node measures 11 mm on image 61/2. Right paratracheal node measures 8 mm on image 48/2. No progressive adenopathy demonstrated. Hilar assessment is limited by the lack of intravenous contrast, although the hilar contours appear unchanged. Surgically absent or atrophied thyroid tissue. The trachea and esophagus appear unremarkable.  Lungs/Pleura: There is no pleural effusion. There is mild centrilobular emphysema with scattered parenchymal scarring. The posterior right upper lobe lesion noted on the most recent study is smaller and less dense, probably an area of scarring (axial image 34/3). This appears linear on the reformatted images. Small nodules in the right upper lobe (image 64/3) and left lower lobe (image 103/3) are stable from 2006. No suspicious pulmonary nodules.  Upper abdomen:  Hepatic steatosis.  No acute findings.  Musculoskeletal/Chest wall: There is no chest wall mass or suspicious osseous finding. There are degenerative changes throughout the thoracolumbar spine. Previous right shoulder reverse arthroplasty.  IMPRESSION: 1. No acute chest findings. 2. No suspicious pulmonary nodules. Area of subpleural scarring in the right upper lobe appears slightly improved. Additional small nodules bilaterally are stable from 2006. 3. Stable mildly prominent mediastinal lymph nodes, likely reactive. 4. Stable mild dilatation of the ascending aorta and central pulmonary arteries.   Electronically Signed   By: Richardean Sale M.D.   On: 06/30/2018 14:50   Impression:  There is been no change in the fusiform ascending aortic aneurysm which is currently measured at 4.3 cm compared to the scan in April where it was measured at 4.5 cm.  This is a small aneurysm and well below the surgical threshold of 5.5 cm.  Should  be followed up in 1 year with a CT of the chest without contrast.  The area of subpleural scarring in the right upper lobe posteriorly appears slightly improved with decrease in size and decreased density.  It is most likely an area of scarring.  There are no other suspicious pulmonary nodules.  I reviewed the CT scan images with the patient and her husband and answered their questions.   Plan:  I will see her back in 1 year with a CT scan of the chest without contrast to follow-up on her fusiform ascending aortic aneurysm.  I spent 15 minutes performing this established patient evaluation and > 50% of this time was spent face to face counseling and coordinating the care of this patient's aortic aneurysm and right lung nodules.   Gaye Pollack, MD [facility] [phone]

## 2018-09-01 ENCOUNTER — Ambulatory Visit: Payer: Medicare Other | Admitting: Surgery

## 2018-09-01 DIAGNOSIS — Z09 Encounter for follow-up examination after completed treatment for conditions other than malignant neoplasm: Secondary | ICD-10-CM | POA: Diagnosis not present

## 2018-09-01 DIAGNOSIS — C4371 Malignant melanoma of right lower limb, including hip: Secondary | ICD-10-CM | POA: Diagnosis not present

## 2018-09-06 ENCOUNTER — Ambulatory Visit (INDEPENDENT_AMBULATORY_CARE_PROVIDER_SITE_OTHER): Payer: Medicare Other | Admitting: Internal Medicine

## 2018-09-06 ENCOUNTER — Encounter: Payer: Self-pay | Admitting: Internal Medicine

## 2018-09-06 VITALS — BP 130/84 | HR 70 | Temp 98.1°F | Ht 64.5 in | Wt 267.0 lb

## 2018-09-06 DIAGNOSIS — R7303 Prediabetes: Secondary | ICD-10-CM | POA: Insufficient documentation

## 2018-09-06 DIAGNOSIS — Z23 Encounter for immunization: Secondary | ICD-10-CM | POA: Diagnosis not present

## 2018-09-06 DIAGNOSIS — Z6841 Body Mass Index (BMI) 40.0 and over, adult: Secondary | ICD-10-CM | POA: Diagnosis not present

## 2018-09-06 DIAGNOSIS — Z0000 Encounter for general adult medical examination without abnormal findings: Secondary | ICD-10-CM | POA: Diagnosis not present

## 2018-09-06 DIAGNOSIS — J410 Simple chronic bronchitis: Secondary | ICD-10-CM | POA: Diagnosis not present

## 2018-09-06 DIAGNOSIS — Z7189 Other specified counseling: Secondary | ICD-10-CM

## 2018-09-06 DIAGNOSIS — I10 Essential (primary) hypertension: Secondary | ICD-10-CM | POA: Diagnosis not present

## 2018-09-06 DIAGNOSIS — F39 Unspecified mood [affective] disorder: Secondary | ICD-10-CM

## 2018-09-06 NOTE — Assessment & Plan Note (Signed)
Has lost 20# Discussed ongoing efforts

## 2018-09-06 NOTE — Progress Notes (Signed)
Subjective:    Patient ID: Kari Medina, female    DOB: 08-Feb-1946, 72 y.o.   MRN: [id number]  HPI Here for Medicare wellness visit and follow up of chronic medical conditions Reviewed form and advanced directives Reviewed other doctors No tobacco or alcohol (former smoker) No exercise--discussed Has "macular dystrophy"---keeping up with eye doctor Hearing not great on testing--- she has some trouble hearing pastor if in back No falls Ongoing mood issues are improved Independent with instrumental ADLs Some memory issues--nothing consistent (overall is pretty good)  Had melanoma removed from her leg Node negative Not completely healed up No adjuvant Rx indicated (apparently)  Concerned about diabetes Husband checked sugar this AM  148/141  Has trouble even walking a short distance without dyspnea Continues to see Dr Raul Del Apparently more of restrictive lung disease (not clear if obstructive as well)  Poor energy levels Has started a thyroid supplement from [facility] it has helped some over the 2.5 weeks since she started it  Has lost 20# since last year Not eating as much  Discussed trying resistance training or weights--- since such easy DOE  Mood is much better on the duloxetine "That is a miracle drug"  Has seen Dr Cyndia Bent about the aneurysm 1 year follow up only  No chest pain  Occasional palpitations--will try aspirin for any symptoms Coughs with a lot of mucus in the morning No dizziness or syncope  Current Outpatient Medications on File Prior to Visit  Medication Sig Dispense Refill  . acetaminophen (TYLENOL) 650 MG CR tablet Take 650 mg by mouth 3 (three) times daily.    Marland Kitchen aspirin 325 MG EC tablet Take 81.25 mg by mouth daily.    Marland Kitchen b complex vitamins tablet Take 1 tablet by mouth daily.      . Bacillus Coagulans-Inulin (PROBIOTIC FORMULA PO) Take by mouth.    Marland Kitchen BREO ELLIPTA 100-25 MCG/INH AEPB Inhale 1 puff into the lungs at bedtime.  5  .  calcium carbonate (OS-CAL) 600 MG TABS Take 600 mg by mouth daily with breakfast.     . cetirizine (ZYRTEC) 10 MG tablet Take 10 mg by mouth daily.    . cholecalciferol (VITAMIN D) 1000 units tablet Take 2,000 Units by mouth 2 (two) times daily.    . DULoxetine (CYMBALTA) 60 MG capsule Take 1 capsule (60 mg total) by mouth daily. 90 capsule 3  . fluticasone (FLONASE) 50 MCG/ACT nasal spray Place 1 spray into both nostrils daily as needed. For stuffy nose  3  . hydrochlorothiazide (HYDRODIURIL) 12.5 MG tablet TAKE 1 TABLET(12.5 MG) BY MOUTH EVERY DAY 30 tablet 0  . Magnesium 500 MG TABS Take 500 mg by mouth 2 (two) times daily.    . montelukast (SINGULAIR) 10 MG tablet TAKE 1 TABLET(10 MG) BY MOUTH AT BEDTIME 90 tablet 0  . OVER THE COUNTER MEDICATION Thyroid Supplement    . Potassium 99 MG TABS Take 99 mg by mouth daily.    . propranolol (INDERAL) 40 MG tablet TAKE 1 TABLET BY MOUTH TWICE DAILY 60 tablet 0  . rOPINIRole (REQUIP) 2 MG tablet TAKE 1 TABLET(2 MG) BY MOUTH EVERY NIGHT 90 tablet 3  . SYNTHROID 200 MCG tablet TAKE 1 TABLET(200 MCG) BY MOUTH DAILY BEFORE BREAKFAST 90 tablet 3  . traMADol (ULTRAM) 50 MG tablet Take 0.5-1 tablets (25-50 mg total) by mouth 3 (three) times daily as needed. 60 tablet 0  . Turmeric Curcumin 500 MG CAPS Take 500 mg by mouth at  bedtime.     . VENTOLIN HFA 108 (90 Base) MCG/ACT inhaler Inhale 2 puffs into the lungs every 6 (six) hours as needed. For shortness of breath/wheezing  11  . vitamin C (ASCORBIC ACID) 500 MG tablet Take 500 mg by mouth daily.     No current facility-administered medications on file prior to visit.     Allergies  Allergen Reactions  . Latex Hives  . Nickel Hives  . Pramipexole Other (See Comments)    Caused hallucinations, says he can take name brand.  . Prednisone   . Topiramate Other (See Comments)    "spaced out"  . Citalopram Anxiety  . Paroxetine Hcl Anxiety  . Sertraline Anxiety    Past Medical History:  Diagnosis  Date  . Asthma   . Depression   . Dyspnea    doe  . Dysrhythmia    extra beat  . Fatty liver   . Fibromyalgia   . Generalized osteoarthritis of multiple sites   . History of hiatal hernia   . Hypertension   . Hypothyroidism   . Melanoma ([place]) 07/2018   Right leg  . OSA (obstructive sleep apnea)   . Pain    chronic ruq and back pain  . Panic attacks   . Raynaud disease   . Raynaud disease   . RLS (restless legs syndrome)   . 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 PATIENT IS UNCERTAIN  . Tremor, essential     Past Surgical History:  Procedure Laterality Date  . CHOLECYSTECTOMY    . JOINT REPLACEMENT Bilateral    2008/2011  . MELANOMA EXCISION  07/2018  . REVERSE SHOULDER ARTHROPLASTY Right 05/28/2017   Procedure: REVERSE SHOULDER ARTHROPLASTY;  Surgeon: Corky Mull, MD;  Location: [facility] ORS;  Service: Orthopedics;  Laterality: Right;  . RHINOPLASTY  1972  . THYROIDECTOMY  2006  . TOTAL KNEE ARTHROPLASTY     bilateral    Family History  Problem Relation Age of Onset  . Osteoarthritis Mother   . Diabetes Mother   . Cirrhosis Mother   . Cancer Father   . Kidney cancer Father   . Bladder Cancer Father   . Heart disease Brother        stents in 1 brother  . Breast cancer Neg Hx     Social History   Socioeconomic History  . Marital status: Married    Spouse name: Not on file  . Number of children: 1  . Years of education: Not on file  . Highest education level: Not on file  Occupational History  . 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 Financial resource strain: Not on file  . Food insecurity:    Worry: Not on file    Inability: Not on file  . Transportation needs:    Medical: Not on file    Non-medical: Not on file  Tobacco Use  . Smoking status: Former Smoker    Last attempt to quit: 12/22/1988    Years since quitting: 29.7  . Smokeless tobacco: Never Used  Substance and Sexual Activity  . Alcohol  use: No  . Drug use: No  . Sexual activity: Not on file  Lifestyle  . Physical activity:    Days per week: Not on file    Minutes per session: Not on file  . Stress: Not on file  Relationships  . Social connections:    Talks on  phone: Not on file    Gets together: Not on file    Attends religious service: Not on file    Active member of club or organization: Not on file    Attends meetings of clubs or organizations: Not on file    Relationship status: Not on file  . Intimate partner violence:    Fear of current or ex partner: Not on file    Emotionally abused: Not on file    Physically abused: Not on file    Forced sexual activity: Not on file  Other Topics Concern  . Not on file  Social History Narrative   1 daughter      Has living will   Husband has health care POA. Alternate would be daughter Judson Roch   Would allow resuscitation but no prolonged machines   Not sure about feeding tubes   Review of Systems Appetite is down some Enjoys games for stimulation Sleeps fine for the most part--uses the ropinirole for RLS still  Wears seat belt Teeth are okay, but has upper/lower partial--needs to find new dentist No heartburn. Occasional trouble swallowing dry meat (like chicken) Bowels are fine    Objective:   Physical Exam  Constitutional: She is oriented to person, place, and time. No distress.  HENT:  Mouth/Throat: Oropharynx is clear and moist. No oropharyngeal exudate.  Neck: No thyromegaly present.  Cardiovascular: Normal rate, regular rhythm, normal heart sounds and intact distal pulses. Exam reveals no gallop.  No murmur heard. Respiratory: Effort normal. No respiratory distress. She has no wheezes. She has no rales.  Slightly decreased breath sounds but clear  GI: Soft. There is no tenderness.  Musculoskeletal: She exhibits no edema or tenderness.  Lymphadenopathy:    She has no cervical adenopathy.  Neurological: She is alert and oriented to person, place, and  time.  President-- "Megan Salon, Obama, Bush" [phone] D-[id number] Recall 3/3  Mild tremor-- left >right hand  Skin: No rash noted. No erythema.  Psychiatric: She has a normal mood and affect. Her behavior is normal.           Assessment & Plan:

## 2018-09-06 NOTE — Assessment & Plan Note (Signed)
Doing better with the duloxetine

## 2018-09-06 NOTE — Assessment & Plan Note (Signed)
Chronic productive cough Easy DOE Restrictive lung disease with sig diffusing capacity defect Sees Dr Raul Del

## 2018-09-06 NOTE — Assessment & Plan Note (Signed)
Sugars up at times Will check again today

## 2018-09-06 NOTE — Assessment & Plan Note (Signed)
BP Readings from Last 3 Encounters:  09/06/18 130/84  08/27/18 (!) 150/84  05/19/18 (!) 150/80   Doing okay now No changes needed

## 2018-09-06 NOTE — Progress Notes (Signed)
Hearing Screening   Method: Audiometry   125Hz  250Hz  500Hz  1000Hz  2000Hz  3000Hz  4000Hz  6000Hz  8000Hz   Right ear:   0 0 40  40    Left ear:   0 0 40  40    Vision Screening Comments: November 2018

## 2018-09-06 NOTE — Assessment & Plan Note (Signed)
See social history 

## 2018-09-06 NOTE — Assessment & Plan Note (Signed)
I have personally reviewed the Medicare Annual Wellness questionnaire and have noted 1. The patient's medical and social history 2. Their use of alcohol, tobacco or illicit drugs 3. Their current medications and supplements 4. The patient's functional ability including ADL's, fall risks, home safety risks and hearing or visual             impairment. 5. Diet and physical activities 6. Evidence for depression or mood disorders  The patients weight, height, BMI and visual acuity have been recorded in the chart I have made referrals, counseling and provided education to the patient based review of the above and I have provided the pt with a written personalized care plan for preventive services.  I have provided you with a copy of your personalized plan for preventive services. Please take the time to review along with your updated medication list.  Flu vaccine today Consider shingrix Colon due 2021 Mammogram done 4/19---discussed 2 years would be okay Discussed doing any exercise she can manage

## 2018-09-07 ENCOUNTER — Encounter: Payer: Self-pay | Admitting: Internal Medicine

## 2018-09-07 ENCOUNTER — Ambulatory Visit (INDEPENDENT_AMBULATORY_CARE_PROVIDER_SITE_OTHER): Payer: Medicare Other | Admitting: Internal Medicine

## 2018-09-07 VITALS — BP 146/88 | HR 72 | Temp 98.0°F | Wt 267.0 lb

## 2018-09-07 DIAGNOSIS — R109 Unspecified abdominal pain: Secondary | ICD-10-CM

## 2018-09-07 DIAGNOSIS — N950 Postmenopausal bleeding: Secondary | ICD-10-CM

## 2018-09-07 DIAGNOSIS — R1032 Left lower quadrant pain: Secondary | ICD-10-CM | POA: Diagnosis not present

## 2018-09-07 LAB — COMPREHENSIVE METABOLIC PANEL
ALT: 22 U/L (ref 0–35)
AST: 25 U/L (ref 0–37)
Albumin: 3.9 g/dL (ref 3.5–5.2)
Alkaline Phosphatase: 80 U/L (ref 39–117)
BUN: 13 mg/dL (ref 6–23)
CO2: 22 mEq/L (ref 19–32)
Calcium: 10.5 mg/dL (ref 8.4–10.5)
Chloride: 103 mEq/L (ref 96–112)
Creatinine, Ser: 0.84 mg/dL (ref 0.40–1.20)
GFR: 71.22 mL/min (ref >60.00)
Glucose, Bld: 92 mg/dL (ref 70–99)
Potassium: 4.1 mEq/L (ref 3.5–5.1)
Sodium: 140 mEq/L (ref 135–145)
Total Bilirubin: 0.5 mg/dL (ref 0.2–1.2)
Total Protein: 7.7 g/dL (ref 6.0–8.3)

## 2018-09-07 LAB — POC URINALSYSI DIPSTICK (AUTOMATED)
Bilirubin, UA: NEGATIVE
Glucose, UA: NEGATIVE
Ketones, UA: NEGATIVE
Nitrite, UA: NEGATIVE
Protein, UA: NEGATIVE
Spec Grav, UA: 1.01 (ref 1.010–1.025)
Urobilinogen, UA: 0.2 E.U./dL
pH, UA: 6 (ref 5.0–8.0)

## 2018-09-07 LAB — LIPID PANEL
Cholesterol: 182 mg/dL (ref 0–200)
HDL: 38 mg/dL — ABNORMAL LOW (ref >39.00)
LDL Cholesterol: 116 mg/dL — ABNORMAL HIGH (ref 0–99)
NonHDL: 144.22
Total CHOL/HDL Ratio: 5
Triglycerides: 142 mg/dL (ref 0.0–149.0)
VLDL: 28.4 mg/dL (ref 0.0–40.0)

## 2018-09-07 LAB — CBC
HCT: 51.2 % — ABNORMAL HIGH (ref 36.0–46.0)
Hemoglobin: 17.1 g/dL — ABNORMAL HIGH (ref 12.0–15.0)
MCHC: 33.3 g/dL (ref 30.0–36.0)
MCV: 93.6 fl (ref 78.0–100.0)
Platelets: 244 10*3/uL (ref 150.0–400.0)
RBC: 5.47 Mil/uL — ABNORMAL HIGH (ref 3.87–5.11)
RDW: 15.2 % (ref 11.5–15.5)
WBC: 9.9 10*3/uL (ref 4.0–10.5)

## 2018-09-07 LAB — T4, FREE: Free T4: 1.36 ng/dL (ref 0.60–1.60)

## 2018-09-07 LAB — HEMOGLOBIN A1C: Hgb A1c MFr Bld: 7.8 % — ABNORMAL HIGH (ref 4.6–6.5)

## 2018-09-07 NOTE — Addendum Note (Signed)
Addended by: Lurlean Nanny on: 09/07/2018 04:39 PM   Modules accepted: Orders

## 2018-09-07 NOTE — Patient Instructions (Signed)
Postmenopausal Bleeding Postmenopausal bleeding is any bleeding after menopause. Menopause is when a woman's period stops. Any type of bleeding after menopause is concerning. It should be checked by your doctor. Any treatment will depend on the cause. Follow these instructions at home: Watch your condition for any changes.  Avoid the use of tampons and douches as told by your doctor.  Change your pads often.  Get regular pelvic exams and Pap tests.  Keep all appointments for tests as told by your doctor.  Contact a doctor if:  Your bleeding lasts for more than 1 week.  You have belly (abdominal) pain.  You have bleeding after sex (intercourse). Get help right away if:  You have a fever, chills, a headache, dizziness, muscle aches, and bleeding.  You have strong pain with bleeding.  You have clumps of blood (blood clots) coming from your vagina.  You have bleeding and need more than 1 pad an hour.  You feel like you are going to pass out (faint). This information is not intended to replace advice given to you by your health care provider. Make sure you discuss any questions you have with your health care provider. Document Released: 09/16/2008 Document Revised: 05/15/2016 Document Reviewed: 07/07/2013 Elsevier Interactive Patient Education  2017 Elsevier Inc.  

## 2018-09-07 NOTE — Progress Notes (Signed)
Subjective:    Patient ID: Kari Medina, female    DOB: 1945/12/25, 72 y.o.   MRN: [id number]  HPI  Pt presents to the clinic today with c/o left flank pain and left lower quadrant pain. She reports this started yesterday. She describes the pain as sore and achy. She has also noticed blood in her urine, and clots on her disposable underpants. She denies urgency, frequency or dysuria. She still has all her female organs. She denies fever or chills. She has no history of kidney stones. She is not sexually active.  Review of Systems      Past Medical History:  Diagnosis Date  . Asthma   . Depression   . Dyspnea    doe  . Dysrhythmia    extra beat  . Fatty liver   . Fibromyalgia   . Generalized osteoarthritis of multiple sites   . History of hiatal hernia   . Hypertension   . Hypothyroidism   . Melanoma ([place]) 07/2018   Right leg  . OSA (obstructive sleep apnea)   . Pain    chronic ruq and back pain  . Panic attacks   . Raynaud disease   . Raynaud disease   . RLS (restless legs syndrome)   . 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 PATIENT IS UNCERTAIN  . Tremor, essential     Current Outpatient Medications  Medication Sig Dispense Refill  . acetaminophen (TYLENOL) 650 MG CR tablet Take 650 mg by mouth 3 (three) times daily.    Marland Kitchen aspirin 325 MG EC tablet Take 81.25 mg by mouth daily.    Marland Kitchen b complex vitamins tablet Take 1 tablet by mouth daily.      . Bacillus Coagulans-Inulin (PROBIOTIC FORMULA PO) Take by mouth.    Marland Kitchen BREO ELLIPTA 100-25 MCG/INH AEPB Inhale 1 puff into the lungs at bedtime.  5  . calcium carbonate (OS-CAL) 600 MG TABS Take 600 mg by mouth daily with breakfast.     . cetirizine (ZYRTEC) 10 MG tablet Take 10 mg by mouth daily.    . cholecalciferol (VITAMIN D) 1000 units tablet Take 2,000 Units by mouth 2 (two) times daily.    . DULoxetine (CYMBALTA) 60 MG capsule Take 1 capsule (60 mg total) by mouth daily. 90 capsule 3  .  fluticasone (FLONASE) 50 MCG/ACT nasal spray Place 1 spray into both nostrils daily as needed. For stuffy nose  3  . hydrochlorothiazide (HYDRODIURIL) 12.5 MG tablet TAKE 1 TABLET(12.5 MG) BY MOUTH EVERY DAY 30 tablet 0  . Magnesium 500 MG TABS Take 500 mg by mouth 2 (two) times daily.    . montelukast (SINGULAIR) 10 MG tablet TAKE 1 TABLET(10 MG) BY MOUTH AT BEDTIME 90 tablet 0  . OVER THE COUNTER MEDICATION Thyroid Supplement    . Potassium 99 MG TABS Take 99 mg by mouth daily.    . propranolol (INDERAL) 40 MG tablet TAKE 1 TABLET BY MOUTH TWICE DAILY 60 tablet 0  . rOPINIRole (REQUIP) 2 MG tablet TAKE 1 TABLET(2 MG) BY MOUTH EVERY NIGHT 90 tablet 3  . SYNTHROID 200 MCG tablet TAKE 1 TABLET(200 MCG) BY MOUTH DAILY BEFORE BREAKFAST 90 tablet 3  . traMADol (ULTRAM) 50 MG tablet Take 0.5-1 tablets (25-50 mg total) by mouth 3 (three) times daily as needed. 60 tablet 0  . Turmeric Curcumin 500 MG CAPS Take 500 mg by mouth at bedtime.     . VENTOLIN HFA 108 (  90 Base) MCG/ACT inhaler Inhale 2 puffs into the lungs every 6 (six) hours as needed. For shortness of breath/wheezing  11  . vitamin C (ASCORBIC ACID) 500 MG tablet Take 500 mg by mouth daily.     No current facility-administered medications for this visit.     Allergies  Allergen Reactions  . Latex Hives  . Nickel Hives  . Pramipexole Other (See Comments)    Caused hallucinations, says he can take name brand.  . Prednisone   . Topiramate Other (See Comments)    "spaced out"  . Citalopram Anxiety  . Paroxetine Hcl Anxiety  . Sertraline Anxiety    Family History  Problem Relation Age of Onset  . Osteoarthritis Mother   . Diabetes Mother   . Cirrhosis Mother   . Cancer Father   . Kidney cancer Father   . Bladder Cancer Father   . Heart disease Brother        stents in 1 brother  . Breast cancer Neg Hx     Social History   Socioeconomic History  . Marital status: Married    Spouse name: Not on file  . Number of  children: 1  . Years of education: Not on file  . Highest education level: Not on file  Occupational History  . 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 Financial resource strain: Not on file  . Food insecurity:    Worry: Not on file    Inability: Not on file  . Transportation needs:    Medical: Not on file    Non-medical: Not on file  Tobacco Use  . Smoking status: Former Smoker    Last attempt to quit: 12/22/1988    Years since quitting: 29.7  . Smokeless tobacco: Never Used  Substance and Sexual Activity  . Alcohol use: No  . Drug use: No  . Sexual activity: Not on file  Lifestyle  . Physical activity:    Days per week: Not on file    Minutes per session: Not on file  . Stress: Not on file  Relationships  . Social connections:    Talks on phone: Not on file    Gets together: Not on file    Attends religious service: Not on file    Active member of club or organization: Not on file    Attends meetings of clubs or organizations: Not on file    Relationship status: Not on file  . Intimate partner violence:    Fear of current or ex partner: Not on file    Emotionally abused: Not on file    Physically abused: Not on file    Forced sexual activity: Not on file  Other Topics Concern  . Not on file  Social History Narrative   1 daughter      Has living will   Husband has health care POA. Alternate would be daughter Judson Roch   Would allow resuscitation but no prolonged machines   Not sure about feeding tubes     Constitutional: Denies fever, malaise, fatigue, headache or abrupt weight changes.   Gastrointestinal: Pt reports left flank pain, abdominal pain. Denies bloating, constipation, diarrhea or blood in the stool.  GU: Pt reports blood in urine, vaginal bleeding. Denies urgency, frequency, pain with urination, burning sensation, odor or discharge.   No other specific complaints in a complete review of systems (except as listed in HPI  above).  Objective:   Physical Exam  BP (!) 146/88   Pulse 72   Temp 98 F (36.7 C) (Oral)   Wt 267 lb (121.1 kg)   SpO2 96%   BMI 45.12 kg/m  Wt Readings from Last 3 Encounters:  09/07/18 267 lb (121.1 kg)  09/06/18 267 lb (121.1 kg)  08/27/18 269 lb (122 kg)    General: Appears her stated age, obese in NAD. Abdomen: Soft and tender in the LLQ. No CVA tenderness noted. Pelvic: Normal female anatomy. Cystocele noted. Blood noted in vaginal vault. Unable to visualize cervix. No CMT. Adnexa non palpable.   BMET    Component Value Date/Time   NA 134 (L) 12/04/2017 1227   K 3.5 12/04/2017 1227   CL 99 12/04/2017 1227   CO2 28 12/04/2017 1227   GLUCOSE 144 (H) 12/04/2017 1227   BUN 19 12/04/2017 1227   CREATININE 1.04 12/04/2017 1227   CALCIUM 9.5 12/04/2017 1227   GFRNONAA >60 05/29/2017 0856   GFRAA >60 05/29/2017 0856    Lipid Panel  No results found for: CHOL, TRIG, HDL, CHOLHDL, VLDL, LDLCALC  CBC    Component Value Date/Time   WBC 18.7 cH (HH) 12/04/2017 1227   RBC 4.73 12/04/2017 1227   HGB 14.6 12/04/2017 1227   HCT 44.3 12/04/2017 1227   PLT 166.0 12/04/2017 1227   MCV 93.5 12/04/2017 1227   MCH 30.9 05/29/2017 0856   MCHC 32.9 12/04/2017 1227   RDW 15.6 (H) 12/04/2017 1227   LYMPHSABS 2.0 12/04/2017 1227   MONOABS 2.9 (H) 12/04/2017 1227   EOSABS 0.1 12/04/2017 1227   BASOSABS 0.1 12/04/2017 1227    Hgb A1C No results found for: HGBA1C          Assessment & Plan:   Postmenopausal Bleeding, Left Flank Pain, LLQ Abdominal Pain:  Urinalysis: 2+ leuks, 3 + blood Will send urine culture Hold off on abx unless culture is positive Will obtain pelvic/transvaginal ultrasound May need referral to GYN for endometrial biopsy  Will follow up after labs/imaging results. Return/ER precaution discussed Webb Silversmith, NP

## 2018-09-08 LAB — URINE CULTURE
MICRO NUMBER:: 91114188
SPECIMEN QUALITY:: ADEQUATE

## 2018-09-09 ENCOUNTER — Other Ambulatory Visit: Payer: Self-pay | Admitting: Internal Medicine

## 2018-09-09 MED ORDER — METFORMIN HCL ER 500 MG PO TB24: 500.0000 mg | ORAL_TABLET | Freq: Every day | ORAL | 3 refills | Status: DC

## 2018-09-09 NOTE — Progress Notes (Signed)
me

## 2018-09-15 ENCOUNTER — Ambulatory Visit
Admission: RE | Admit: 2018-09-15 | Discharge: 2018-09-15 | Disposition: A | Discharge status: Home / Self Care | Payer: Medicare Other | Source: Ambulatory Visit | Attending: Internal Medicine | Admitting: Internal Medicine

## 2018-09-15 ENCOUNTER — Telehealth: Payer: Self-pay | Admitting: *Deleted

## 2018-09-15 DIAGNOSIS — D259 Leiomyoma of uterus, unspecified: Secondary | ICD-10-CM | POA: Insufficient documentation

## 2018-09-15 DIAGNOSIS — R9389 Abnormal findings on diagnostic imaging of other specified body structures: Secondary | ICD-10-CM | POA: Diagnosis not present

## 2018-09-15 DIAGNOSIS — N950 Postmenopausal bleeding: Secondary | ICD-10-CM | POA: Diagnosis not present

## 2018-09-15 NOTE — Telephone Encounter (Signed)
Copied from [place] [phone]. Topic: General - Other >> Sep 15, 2018  4:27 PM Carolyn Stare wrote:  Pt think her Metformin may be causing her to have running legs more and is asking if she may need to increase rOPINIRole (REQUIP) 2 MG tablet to maybe 2 a day one I nthe morning and one at night . She said she takes her metformin at her lunch and then after a few hours her legs to running

## 2018-09-16 ENCOUNTER — Other Ambulatory Visit: Payer: Self-pay | Admitting: Internal Medicine

## 2018-09-16 DIAGNOSIS — N950 Postmenopausal bleeding: Secondary | ICD-10-CM

## 2018-09-16 NOTE — Telephone Encounter (Signed)
4mg  is a very high dose---and you should not increase that much so quickly It would be okay to try 1mg  in the morning along with the 2mg  at night Not sure if tabs can be cut---okay to send new prescription if needed

## 2018-09-16 NOTE — Telephone Encounter (Signed)
Spoke to pt. She has not increased it yet. She said they can be cut in half. She will do that and let us know how it is working and when she needs a refill/ new rx.

## 2018-09-17 ENCOUNTER — Telehealth: Payer: Self-pay | Admitting: Internal Medicine

## 2018-09-17 NOTE — Telephone Encounter (Signed)
Copied from [place] [phone]. Topic: Quick Communication - See Telephone Encounter >> Sep 17, 2018  2:31 PM Kari Medina wrote: CRM for notification. See Telephone encounter for: 09/17/18.  Pt would like to know if Dr. Silvio Pate or his RN could call and discuss her latest ultrasound results that were released through my chart. Pt states she would just like to have results explained to her and advised on what the next step will be. Please advise.

## 2018-09-17 NOTE — Telephone Encounter (Signed)
Spoke to pt see result note

## 2018-09-22 ENCOUNTER — Other Ambulatory Visit: Payer: Self-pay | Admitting: Internal Medicine

## 2018-09-23 ENCOUNTER — Telehealth: Payer: Self-pay | Admitting: Internal Medicine

## 2018-09-23 NOTE — Telephone Encounter (Signed)
Copied from [place] [phone]. Topic: Quick Communication - Rx Refill/Question >> Sep 23, 2018  3:04 PM Waylan Rocher, Lumin L wrote: Medication: would like a call back to discuss a glucose meter that does not require a "stick" and is covered by Medicare A+B  Has the patient contacted their pharmacy? No. (Agent: If no, request that the patient contact the pharmacy for the refill.) (Agent: If yes, when and what did the pharmacy advise?)  Preferred Pharmacy (with phone number or street name): [id number] [address], [place] - [address] [place] [address] Phone: [phone] Fax: [phone]  Agent: Please be advised that RX refills may take up to 3 business days. We ask that you follow-up with your pharmacy.

## 2018-09-23 NOTE — Telephone Encounter (Signed)
Tried to call pt. No answer and no VM. She would need to contact her insurance about the [employer]. She is not insulin dependent so they may not cover it.

## 2018-09-27 ENCOUNTER — Ambulatory Visit (INDEPENDENT_AMBULATORY_CARE_PROVIDER_SITE_OTHER): Payer: Medicare Other | Admitting: Obstetrics and Gynecology

## 2018-09-27 ENCOUNTER — Other Ambulatory Visit (HOSPITAL_COMMUNITY)
Admission: RE | Admit: 2018-09-27 | Discharge: 2018-09-27 | Disposition: A | Discharge status: Home / Self Care | Payer: Medicare Other | Source: Ambulatory Visit | Attending: Obstetrics and Gynecology | Admitting: Obstetrics and Gynecology

## 2018-09-27 ENCOUNTER — Encounter: Payer: Self-pay | Admitting: Obstetrics and Gynecology

## 2018-09-27 VITALS — BP 141/80 | HR 87 | Resp 18 | Ht 66.0 in | Wt 260.4 lb

## 2018-09-27 DIAGNOSIS — R32 Unspecified urinary incontinence: Secondary | ICD-10-CM

## 2018-09-27 DIAGNOSIS — Z7951 Long term (current) use of inhaled steroids: Secondary | ICD-10-CM | POA: Diagnosis not present

## 2018-09-27 DIAGNOSIS — R7303 Prediabetes: Secondary | ICD-10-CM | POA: Insufficient documentation

## 2018-09-27 DIAGNOSIS — Z79899 Other long term (current) drug therapy: Secondary | ICD-10-CM | POA: Diagnosis not present

## 2018-09-27 DIAGNOSIS — Z87891 Personal history of nicotine dependence: Secondary | ICD-10-CM | POA: Diagnosis not present

## 2018-09-27 DIAGNOSIS — N950 Postmenopausal bleeding: Secondary | ICD-10-CM

## 2018-09-27 DIAGNOSIS — Z79890 Hormone replacement therapy (postmenopausal): Secondary | ICD-10-CM | POA: Diagnosis not present

## 2018-09-27 DIAGNOSIS — G4733 Obstructive sleep apnea (adult) (pediatric): Secondary | ICD-10-CM | POA: Insufficient documentation

## 2018-09-27 DIAGNOSIS — J42 Unspecified chronic bronchitis: Secondary | ICD-10-CM | POA: Insufficient documentation

## 2018-09-27 DIAGNOSIS — G2581 Restless legs syndrome: Secondary | ICD-10-CM | POA: Diagnosis not present

## 2018-09-27 DIAGNOSIS — I10 Essential (primary) hypertension: Secondary | ICD-10-CM | POA: Insufficient documentation

## 2018-09-27 DIAGNOSIS — I7300 Raynaud's syndrome without gangrene: Secondary | ICD-10-CM | POA: Insufficient documentation

## 2018-09-27 DIAGNOSIS — N840 Polyp of corpus uteri: Secondary | ICD-10-CM | POA: Diagnosis not present

## 2018-09-27 DIAGNOSIS — Z7982 Long term (current) use of aspirin: Secondary | ICD-10-CM | POA: Diagnosis not present

## 2018-09-27 NOTE — Progress Notes (Signed)
Obstetrics and Gynecology New Patient Evaluation  Appointment Date: 09/27/2018  OBGYN Clinic: [facility]  Primary Care Provider: Viviana Medina I  Referring Provider: Jearld Fenton, NP  Chief Complaint: Post menopausal bleeding   History of Present Illness: Kari Medina is a 72 y.o. Caucasian G1P1001 (No LMP recorded. Patient is postmenopausal.), seen for the above chief complaint. Her past medical history is significant for HTN. In early September, she started an [place] thyroid supplement (pt unsure of name) about two weeks before having PMB which started the night before seeing her pcp. On 9/17, she saw her PCP after noticing blood on undergarments and exam noted blood in the vaginal vault but unable to visualize cervix. She was scheduled for an u/s and referred to me. She had a 9/25 u/s done that showed 8.7cm uterus with 36mm endometrial stripe with areas of increased vascularity  Last VB was a few weeks ago. She also notes urinary incontinence for the past 7-8 years with bladder leakage all the time. It doesn't sound like she has overt OAB or SUI s/s.   Review of Systems: as noted in the History of Present Illness.  Patient Active Problem List   Diagnosis Date Noted  . Prediabetes 09/06/2018  . Melanoma ([place]) 07/22/2018  . Thoracic ascending aortic aneurysm ([place]) 04/07/2018  . Mood disorder ([place]) 03/02/2018  . Status post total bilateral knee replacement 12/03/2017  . Preventative health care 08/20/2017  . Urinary incontinence 08/20/2017  . Advance directive discussed with patient 08/20/2017  . 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 BMI 40.0-44.9, adult ([place]) 05/22/2016  . Chronic bronchitis ([place]) 04/16/2016  . Hypertension   . Tremor, essential   . OSA (obstructive sleep apnea)   . RLS (restless legs syndrome)   . Raynaud disease   . Osteoarthrosis, generalized, multiple joints     Past Medical  History:  Past Medical History:  Diagnosis Date  . Asthma   . Depression   . Dyspnea    doe  . Dysrhythmia    extra beat  . Fatty liver   . Fibromyalgia   . Generalized osteoarthritis of multiple sites   . History of hiatal hernia   . Hypertension   . Hypothyroidism   . Melanoma ([place]) 07/2018   Right leg  . OSA (obstructive sleep apnea)   . Pain    chronic ruq and back pain  . Panic attacks   . Raynaud disease   . RLS (restless legs syndrome)   . 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 PATIENT IS UNCERTAIN  . Tremor, essential     Past Surgical History:  Past Surgical History:  Procedure Laterality Date  . CHOLECYSTECTOMY    . JOINT REPLACEMENT Bilateral    2008/2011  . MELANOMA EXCISION  07/2018  . REVERSE SHOULDER ARTHROPLASTY Right 05/28/2017   Procedure: REVERSE SHOULDER ARTHROPLASTY;  Surgeon: Corky Mull, MD;  Location: [facility] ORS;  Service: Orthopedics;  Laterality: Right;  . RHINOPLASTY  1972  . THYROIDECTOMY  2006  . TOTAL KNEE ARTHROPLASTY     bilateral    Past Obstetrical History:  OB History  Gravida Para Term Preterm AB Living  1 1 1     1   SAB TAB Ectopic Multiple Live Births          1    # Outcome Date GA Lbr Len/2nd Weight Sex Delivery Anes PTL Lv  1 Term  Vag-Spont       Past Gynecological History: As per HPI. Periods: age 21 History of Pap Smear(s): Yes.   Last pap several years ago, which was normal and no h/o abnormals History of HRT use: No.  Social History:  Social History   Socioeconomic History  . Marital status: Married    Spouse name: Not on file  . Number of children: 1  . Years of education: Not on file  . Highest education level: Not on file  Occupational History  . 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 Financial resource strain: Not on file  . Food insecurity:    Worry: Not on file    Inability: Not on file  . Transportation needs:    Medical: Not on file     Non-medical: Not on file  Tobacco Use  . Smoking status: Former Smoker    Last attempt to quit: 12/22/1988    Years since quitting: 29.7  . Smokeless tobacco: Never Used  Substance and Sexual Activity  . Alcohol use: No  . Drug use: No  . Sexual activity: Not on file  Lifestyle  . Physical activity:    Days per week: Not on file    Minutes per session: Not on file  . Stress: Not on file  Relationships  . Social connections:    Talks on phone: Not on file    Gets together: Not on file    Attends religious service: Not on file    Active member of club or organization: Not on file    Attends meetings of clubs or organizations: Not on file    Relationship status: Not on file  . Intimate partner violence:    Fear of current or ex partner: Not on file    Emotionally abused: Not on file    Physically abused: Not on file    Forced sexual activity: Not on file  Other Topics Concern  . Not on file  Social History Narrative   1 daughter      Has living will   Husband has health care POA. Alternate would be daughter Judson Roch   Would allow resuscitation but no prolonged machines   Not sure about feeding tubes    Family History:  Family History  Problem Relation Age of Onset  . Osteoarthritis Mother   . Diabetes Mother   . Cirrhosis Mother   . Cancer Father   . Kidney cancer Father   . Bladder Cancer Father   . Heart disease Brother        stents in 1 brother  . Breast cancer Neg Hx     Health Maintenance:  Mammogram(s): Yes.   Date: 03/2018, BIRADS 1 Colonoscopy: Yes.   Date: 2016 Medications [name] had no medications administered during this visit. Current Outpatient Medications  Medication Sig Dispense Refill  . acetaminophen (TYLENOL) 650 MG CR tablet Take 650 mg by mouth 3 (three) times daily.    Marland Kitchen aspirin 325 MG EC tablet Take 81.25 mg by mouth daily.    Marland Kitchen b complex vitamins tablet Take 1 tablet by mouth daily.      . Bacillus Coagulans-Inulin (PROBIOTIC  FORMULA PO) Take by mouth.    Marland Kitchen BREO ELLIPTA 100-25 MCG/INH AEPB Inhale 1 puff into the lungs at bedtime.  5  . calcium carbonate (OS-CAL) 600 MG TABS Take 600 mg by mouth daily with breakfast.     . cetirizine (ZYRTEC)  10 MG tablet Take 10 mg by mouth daily.    . cholecalciferol (VITAMIN D) 1000 units tablet Take 2,000 Units by mouth 2 (two) times daily.    . DULoxetine (CYMBALTA) 60 MG capsule Take 1 capsule (60 mg total) by mouth daily. 90 capsule 3  . fluticasone (FLONASE) 50 MCG/ACT nasal spray Place 1 spray into both nostrils daily as needed. For stuffy nose  3  . hydrochlorothiazide (HYDRODIURIL) 12.5 MG tablet TAKE 1 TABLET(12.5 MG) BY MOUTH EVERY DAY 30 tablet 11  . Magnesium 500 MG TABS Take 500 mg by mouth 2 (two) times daily.    . metFORMIN (GLUCOPHAGE-XR) 500 MG 24 hr tablet Take 1 tablet (500 mg total) by mouth daily with breakfast. 90 tablet 3  . montelukast (SINGULAIR) 10 MG tablet TAKE 1 TABLET(10 MG) BY MOUTH AT BEDTIME 90 tablet 0  . Potassium 99 MG TABS Take 99 mg by mouth daily.    . propranolol (INDERAL) 40 MG tablet TAKE 1 TABLET BY MOUTH TWICE DAILY 60 tablet 11  . rOPINIRole (REQUIP) 2 MG tablet TAKE 1 TABLET(2 MG) BY MOUTH EVERY NIGHT 90 tablet 3  . SYNTHROID 200 MCG tablet TAKE 1 TABLET(200 MCG) BY MOUTH DAILY BEFORE BREAKFAST 90 tablet 3  . traMADol (ULTRAM) 50 MG tablet Take 0.5-1 tablets (25-50 mg total) by mouth 3 (three) times daily as needed. 60 tablet 0  . Turmeric Curcumin 500 MG CAPS Take 500 mg by mouth at bedtime.     . VENTOLIN HFA 108 (90 Base) MCG/ACT inhaler Inhale 2 puffs into the lungs every 6 (six) hours as needed. For shortness of breath/wheezing  11  . vitamin C (ASCORBIC ACID) 500 MG tablet Take 500 mg by mouth daily.     No current facility-administered medications for this visit.     Allergies Latex; Nickel; Pramipexole; Prednisone; Topiramate; Citalopram; Paroxetine hcl; and Sertraline   Physical Exam:  BP (!) 141/80 (BP Location: Left  Arm, Patient Position: Sitting, Cuff Size: Large)   Pulse 87   Resp 18   Ht 5\' 6"  (1.676 m)   Wt 260 lb 6.4 oz (118.1 kg)   BMI 42.03 kg/m  Body mass index is 42.03 kg/m. General appearance: Well nourished, well developed female in no acute distress.  Cardiovascular: normal s1 and s2.  No murmurs, rubs or gallops. Respiratory:  Clear to auscultation bilateral. Normal respiratory effort Abdomen: positive bowel sounds and no masses, hernias; diffusely non tender to palpation, non distended Neuro/Psych:  Normal mood and affect.  Skin:  Warm and dry.  Lymphatic:  No inguinal lymphadenopathy.   Pelvic exam: is not limited by body habitus EGBUS: within normal limits with moderate atrophy, Vagina: within normal limits, with moderate atrophy and with no blood or discharge in the vault, Cervix: normal appearing cervix without tenderness, discharge or lesions. Cervix is very posterior and deep Uterus:  nonenlarged and non tender and Adnexa:  normal adnexa and no mass, fullness, tenderness Rectovaginal: deferred  See procedure note for embx  Laboratory: none  Radiology:  CLINICAL DATA:  Initial evaluation for postmenopausal bleeding.  EXAM: TRANSABDOMINAL AND TRANSVAGINAL ULTRASOUND OF PELVIS  TECHNIQUE: Both transabdominal and transvaginal ultrasound examinations of the pelvis were performed. Transabdominal technique was performed for global imaging of the pelvis including uterus, ovaries, adnexal regions, and pelvic cul-de-sac. It was necessary to proceed with endovaginal exam following the transabdominal exam to visualize the uterus, endometrium, and ovaries.  COMPARISON:  None available.  FINDINGS: Uterus  Measurements: 8.7 x 5.0  x 5.5 cm. 2.8 x 3.1 x 3.1 cm probable intramural fibroid present at the right uterine fundus.  Endometrium  Thickness: 15.2 mm. Uterus demonstrates a heterogeneous echotexture with suggestion of scattered areas of increased  vascularity.  Right ovary  Measurements: 1.9 x 1.6 x 1.1 cm. Normal appearance/no adnexal mass.  Left ovary  Measurements: 2.0 x 1.3 x 1.5 cm. Normal appearance/no adnexal mass.  Other findings  No definite free fluid within the pelvis.  IMPRESSION: 1. Endometrial stripe abnormally thickened up to 15 mm with heterogeneous echotexture and increased vascularity. In the setting of post-menopausal bleeding, endometrial sampling is indicated to exclude carcinoma. If results are benign, sonohysterogram should be considered for focal lesion work-up. (Ref: Radiological Reasoning: Algorithmic Workup of Abnormal Vaginal Bleeding with Endovaginal Sonography and Sonohysterography. AJR 2008; 100:P49-61). 2. 3.1 cm right fundal fibroid. 3. Normal sonographic appearance of the ovaries.  No adnexal mass.   Electronically Signed   By: Jeannine Boga M.D.   On: 09/15/2018 22:00  Assessment: pt stable  Plan:  PMB: Follow up pap and embx results. I d/w her that even if both are negative that given her s/s that I'd recommend a hysteroscopy, D&C given her u/s findings  Urinary incontinence: pt wondering about if anything can be done for this. She had negative ucx. I told her that can refer her to Urogyn re: this based on lab results.   RTC PRN  Durene Romans MD Attending [facility])

## 2018-09-27 NOTE — Procedures (Signed)
Endometrial Biopsy Procedure Note  Pre-operative Diagnosis: Postmenopausal bleeding  Post-operative Diagnosis: same   Procedure Details  The risks (including infection, bleeding, pain, and uterine perforation) and benefits of the procedure were explained to the patient and Written informed consent was obtained.  The patient was placed in the dorsal lithotomy position.  Bimanual exam showed the uterus to be in the neutral position.  A Graves' speculum inserted in the vagina, and the cervix was visualized and a pap smear performed; the cervix was very posterior and fairly deep. The cervix was then prepped with povidone iodine, and a sharp tenaculum was applied to the anterior lip of the cervix for stabilization.  A pipelle was inserted into the uterine cavity and sounded the uterus to a depth of 8cm; the opening into the cervical canal is on the left side of the external os opening.  A Small amount of tissue was collected after 2 passes. The sample was sent for pathologic examination.  Condition: Stable  Complications: None  Plan: The patient was advised to call for any fever or for prolonged or severe pain or bleeding. She was advised to use OTC analgesics as needed for mild to moderate pain. She was advised to avoid vaginal intercourse for 48 hours or until the bleeding has completely stopped.  Durene Romans MD Attending [facility])

## 2018-09-28 DIAGNOSIS — N950 Postmenopausal bleeding: Secondary | ICD-10-CM | POA: Insufficient documentation

## 2018-09-30 LAB — CYTOLOGY - PAP
Diagnosis: NEGATIVE
HPV: NOT DETECTED

## 2018-09-30 MED ORDER — ONETOUCH DELICA PLUS LANCING MISC: 1.0000 [IU] | Freq: Once | 0 refills | Status: AC

## 2018-09-30 MED ORDER — GLUCOSE BLOOD VI STRP: ORAL_STRIP | 3 refills | Status: DC

## 2018-09-30 MED ORDER — ONETOUCH VERIO W/DEVICE KIT: 1.0000 | PACK | Freq: Once | 0 refills | Status: AC

## 2018-09-30 MED ORDER — ONETOUCH DELICA LANCETS FINE MISC: 1.0000 | Freq: Every day | 3 refills | Status: DC

## 2018-09-30 NOTE — Telephone Encounter (Signed)
Pt called in to update [facility]. Pt says that the [facility] on [address] has the one touch meter. Pt would like to have meter, lancets and inserts sent in to that pharmacy.   Pt says that she would like a 3 month supply if possible. Pt says that her insurance will cover.

## 2018-09-30 NOTE — Addendum Note (Signed)
Addended by: Pilar Grammes on: 09/30/2018 01:18 PM   Modules accepted: Orders

## 2018-09-30 NOTE — Telephone Encounter (Signed)
Rxs sent for One Touch Glucometer and supplies.

## 2018-09-30 NOTE — Telephone Encounter (Signed)
Spoke to pt. She spoke to insurance and they will not cover the [employer]. She is going to contact them and find out the brand her insurance will cover for a regular glucometer.

## 2018-10-04 ENCOUNTER — Telehealth: Payer: Self-pay | Admitting: Obstetrics and Gynecology

## 2018-10-04 NOTE — Telephone Encounter (Signed)
GYN Telephone Note D/w Mrs. Skorupski and she is amenable to surgery.  Durene Romans MD Attending [facility] (Faculty Practice) 10/04/2018 Time: 1600

## 2018-10-04 NOTE — Telephone Encounter (Signed)
GYN telephone note I called [phone] and [name] was in the shower but I spoke to her husband and d/w him negative pap and bx but did show a polyp which is very likely causing her PMB. I told him that that is very likely causing the bleeding and I still do recommend going ahead with a hysteroscopy, D&C to fully evaluate it and remove it if still present like we discussed previously.  I told him that I will call back later to personally talk to her but we will move in that direction for now. I will touch base with her PCP for medical clearance but it looks like she did well in June 2018 for her shoulder surgery which she had general anesthesia.  Durene Romans MD Attending [facility] (Faculty Practice) 10/04/2018 Time: 1203pm

## 2018-10-07 ENCOUNTER — Telehealth: Payer: Self-pay

## 2018-10-07 NOTE — Telephone Encounter (Signed)
-----   Message from Jearld Fenton, NP sent at 10/06/2018  1:51 PM EDT ----- Regarding: FW: pre op clearance for hysteroscopy, d&c Please call and schedule pt for preop visit with me or Dr. Silvio Pate, whichever she prefers. ----- Message ----- From: Venia Carbon, MD Sent: 10/06/2018   1:12 PM EDT To: Jearld Fenton, NP Subject: RE: pre op clearance for hysteroscopy, d&c     You or me---whichever works out better (I am not possessive about my patients) ----- Message ----- From: Jearld Fenton, NP Sent: 10/06/2018  12:18 PM EDT To: Venia Carbon, MD Subject: FW: pre op clearance for hysteroscopy, d&c     I saw this patient when you were unavailable. Would you like to see her for a preop appointment? ----- Message ----- From: Aletha Halim, MD Sent: 10/04/2018  12:04 PM EDT To: Jearld Fenton, NP Subject: pre op clearance for hysteroscopy, d&c         Kari Medina. Her pap and biopsy were negative but did show an endometrial polyp, which I recommend assessing with a hysteroscopy, d&c, which is an oupatient procedure. It looks like she did well with her June 2018 surgery. Can you assess her for this procedure and then when y'all have a visit date for her we can schedule her surgery? Thanks!

## 2018-10-07 NOTE — Telephone Encounter (Signed)
Okay---will see her then

## 2018-10-07 NOTE — Telephone Encounter (Signed)
Pt has pre op appt Mon Oct 21st with Dr Silvio Pate

## 2018-10-08 ENCOUNTER — Other Ambulatory Visit: Payer: Self-pay | Admitting: Internal Medicine

## 2018-10-11 ENCOUNTER — Ambulatory Visit (INDEPENDENT_AMBULATORY_CARE_PROVIDER_SITE_OTHER): Payer: Medicare Other | Admitting: Internal Medicine

## 2018-10-11 ENCOUNTER — Encounter: Payer: Self-pay | Admitting: Internal Medicine

## 2018-10-11 VITALS — BP 110/70 | HR 70 | Temp 97.6°F | Ht 66.0 in | Wt 263.0 lb

## 2018-10-11 DIAGNOSIS — Z01818 Encounter for other preprocedural examination: Secondary | ICD-10-CM

## 2018-10-11 MED ORDER — TRAMADOL HCL 50 MG PO TABS: 25.0000 mg | ORAL_TABLET | Freq: Three times a day (TID) | ORAL | 0 refills | Status: DC | PRN

## 2018-10-11 NOTE — Progress Notes (Signed)
Subjective:    Patient ID: Kari Medina, female    DOB: December 09, 1946, 72 y.o.   MRN: [id number]  HPI Here for preoperative evaluation  Had post menopausal bleeding Ultrasound showed polyp and widened endometrial stripe Endometrial biopsy was negative Planning hysteroscopy and D&C  Does get some "funny" feeling in chest Not pains Goes away after taking an aspirin No palpitations Some mild edema at times  Breathing is "not good" Feels better since starting on the metformin No cough or fever Can feel soreness in right lung---like with deep breath CT scan recently was not worrisome--- some scarring in RUL and stable nodules  Current Outpatient Medications on File Prior to Visit  Medication Sig Dispense Refill  . acetaminophen (TYLENOL) 650 MG CR tablet Take 650 mg by mouth 3 (three) times daily.    Marland Kitchen aspirin 325 MG EC tablet Take 81.25 mg by mouth daily.    Marland Kitchen b complex vitamins tablet Take 1 tablet by mouth daily.      . Bacillus Coagulans-Inulin (PROBIOTIC FORMULA PO) Take by mouth.    Marland Kitchen BREO ELLIPTA 100-25 MCG/INH AEPB Inhale 1 puff into the lungs at bedtime.  5  . calcium carbonate (OS-CAL) 600 MG TABS Take 600 mg by mouth daily with breakfast.     . cetirizine (ZYRTEC) 10 MG tablet Take 10 mg by mouth daily.    . cholecalciferol (VITAMIN D) 1000 units tablet Take 2,000 Units by mouth 2 (two) times daily.    . DULoxetine (CYMBALTA) 60 MG capsule Take 1 capsule (60 mg total) by mouth daily. 90 capsule 3  . fluticasone (FLONASE) 50 MCG/ACT nasal spray Place 1 spray into both nostrils daily as needed. For stuffy nose  3  . glucose blood (ONETOUCH VERIO) test strip Use to check blood sugar once a day. E11.9 100 each 3  . hydrochlorothiazide (HYDRODIURIL) 12.5 MG tablet TAKE 1 TABLET(12.5 MG) BY MOUTH EVERY DAY 30 tablet 11  . Magnesium 500 MG TABS Take 500 mg by mouth 2 (two) times daily.    . metFORMIN (GLUCOPHAGE-XR) 500 MG 24 hr tablet Take 1 tablet (500 mg total) by  mouth daily with breakfast. 90 tablet 3  . montelukast (SINGULAIR) 10 MG tablet TAKE 1 TABLET(10 MG) BY MOUTH AT BEDTIME 90 tablet 0  . ONETOUCH DELICA LANCETS FINE MISC 1 each by Does not apply route daily. Use to check blood sugar once a day. E11.9 100 each 3  . Potassium 99 MG TABS Take 99 mg by mouth daily.    . propranolol (INDERAL) 40 MG tablet TAKE 1 TABLET BY MOUTH TWICE DAILY 60 tablet 11  . rOPINIRole (REQUIP) 2 MG tablet TAKE 1 TABLET(2 MG) BY MOUTH EVERY NIGHT 90 tablet 3  . SYNTHROID 200 MCG tablet TAKE 1 TABLET(200 MCG) BY MOUTH DAILY BEFORE BREAKFAST 90 tablet 3  . traMADol (ULTRAM) 50 MG tablet Take 0.5-1 tablets (25-50 mg total) by mouth 3 (three) times daily as needed. 60 tablet 0  . Turmeric Curcumin 500 MG CAPS Take 500 mg by mouth at bedtime.     . VENTOLIN HFA 108 (90 Base) MCG/ACT inhaler Inhale 2 puffs into the lungs every 6 (six) hours as needed. For shortness of breath/wheezing  11  . vitamin C (ASCORBIC ACID) 500 MG tablet Take 500 mg by mouth daily.     No current facility-administered medications on file prior to visit.     Allergies  Allergen Reactions  . Latex Hives  . Nickel Hives  .  Pramipexole Other (See Comments)    Caused hallucinations, says he can take name brand.  . Prednisone   . Topiramate Other (See Comments)    "spaced out"  . Citalopram Anxiety  . Paroxetine Hcl Anxiety  . Sertraline Anxiety    Past Medical History:  Diagnosis Date  . Asthma   . Depression   . Dyspnea    doe  . Dysrhythmia    extra beat  . Fatty liver   . Fibromyalgia   . Generalized osteoarthritis of multiple sites   . History of hiatal hernia   . Hypertension   . Hypothyroidism   . Melanoma ([place]) 07/2018   Right leg  . OSA (obstructive sleep apnea)   . Pain    chronic ruq and back pain  . Panic attacks   . Raynaud disease   . RLS (restless legs syndrome)   . 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 PATIENT IS UNCERTAIN  . Tremor,  essential     Past Surgical History:  Procedure Laterality Date  . CHOLECYSTECTOMY    . JOINT REPLACEMENT Bilateral    2008/2011  . MELANOMA EXCISION  07/2018  . REVERSE SHOULDER ARTHROPLASTY Right 05/28/2017   Procedure: REVERSE SHOULDER ARTHROPLASTY;  Surgeon: Corky Mull, MD;  Location: [facility] ORS;  Service: Orthopedics;  Laterality: Right;  . RHINOPLASTY  1972  . THYROIDECTOMY  2006  . TOTAL KNEE ARTHROPLASTY     bilateral    Family History  Problem Relation Age of Onset  . Osteoarthritis Mother   . Diabetes Mother   . Cirrhosis Mother   . Cancer Father   . Kidney cancer Father   . Bladder Cancer Father   . Heart disease Brother        stents in 1 brother  . Breast cancer Neg Hx     Social History   Socioeconomic History  . Marital status: Married    Spouse name: Not on file  . Number of children: 1  . Years of education: Not on file  . Highest education level: Not on file  Occupational History  . 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 Financial resource strain: Not on file  . Food insecurity:    Worry: Not on file    Inability: Not on file  . Transportation needs:    Medical: Not on file    Non-medical: Not on file  Tobacco Use  . Smoking status: Former Smoker    Last attempt to quit: 12/22/1988    Years since quitting: 29.8  . Smokeless tobacco: Never Used  Substance and Sexual Activity  . Alcohol use: No  . Drug use: No  . Sexual activity: Not on file  Lifestyle  . Physical activity:    Days per week: Not on file    Minutes per session: Not on file  . Stress: Not on file  Relationships  . Social connections:    Talks on phone: Not on file    Gets together: Not on file    Attends religious service: Not on file    Active member of club or organization: Not on file    Attends meetings of clubs or organizations: Not on file    Relationship status: Not on file  . Intimate partner violence:    Fear of current or ex  partner: Not on file    Emotionally abused: Not on file    Physically  abused: Not on file    Forced sexual activity: Not on file  Other Topics Concern  . Not on file  Social History Narrative   1 daughter      Has living will   Husband has health care POA. Alternate would be daughter Judson Roch   Would allow resuscitation but no prolonged machines   Not sure about feeding tubes   Review of Systems  Appetite is fine Usually sleeps okay---occasional bad night Sleeps in adjustable bed--- usually flat. Uses CPAP--occasionally takes it off "when it gets on my nerves"     Objective:   Physical Exam  Constitutional: She appears well-developed. No distress.  Neck: No thyromegaly present.  Cardiovascular: Normal rate, regular rhythm, normal heart sounds and intact distal pulses. Exam reveals no gallop.  No murmur heard. Respiratory: Effort normal and breath sounds normal. No respiratory distress. She has no wheezes. She has no rales.  Slight upper airway (throat) wheeze with forced expiration  GI: Soft. She exhibits no distension. There is no tenderness. There is no rebound and no guarding.  Musculoskeletal:  No calf tenderness  Lymphadenopathy:    She has no cervical adenopathy.           Assessment & Plan:

## 2018-10-11 NOTE — Assessment & Plan Note (Addendum)
Seems to be fine Vague chest symptoms but had negative myocardial perfusion scan in 2018 Will check EKG---sinus at 68. Poor R wave progression and inverted T waves in V1-2 are non specific. Unfortunately I don't see a prior one even in [place] records.  Overall, there are no worrisome features  She is medically clear to go ahead with this low risk procedure. Biggest issue would be pulmonary---but given it should be outpatient with early ambulation, I don't expect any issues

## 2018-10-11 NOTE — Addendum Note (Signed)
Addended by: [name] I on: 10/11/2018 03:00 PM   Modules accepted: Orders

## 2018-10-12 ENCOUNTER — Encounter (HOSPITAL_COMMUNITY): Payer: Self-pay

## 2018-10-20 ENCOUNTER — Other Ambulatory Visit: Payer: Self-pay | Admitting: Internal Medicine

## 2018-10-22 ENCOUNTER — Telehealth: Payer: Self-pay | Admitting: Internal Medicine

## 2018-10-22 NOTE — Telephone Encounter (Signed)
Patient hasn't felt well since Wednesday.  Patient has a fever of 101 and bad headache. Patient took a Tylenol and drinking fluids. Patient said she's coughing a little bit.  Patient's having a roof put on her house today and it's difficult to get out of her driveway. Patient was offered an appointment with Dr.Tower at 11:15, but she refused. She'd like an antibiotic called in to her pharmacy because she doesn't feel like leaving the house.  Patient said she can go to [facility] tomorrow, if she's not feeling better or call Dr.Fleming's Office.  Patient uses [facility] on [address] and [address].

## 2018-10-22 NOTE — Telephone Encounter (Signed)
Spoke to pt. She said she understands. 

## 2018-10-22 NOTE — Telephone Encounter (Signed)
With the fever and headache--it sounds like a viral infection (not exacerbation of her bronchitis) I don't think antibiotics are indicated at this point (unless there is a progression of her respiratory symptoms). She can wait it out for now unless she has increased productive cough or SOB

## 2018-10-23 ENCOUNTER — Inpatient Hospital Stay
Admission: EM | Admit: 2018-10-23 | Discharge: 2018-10-25 | DRG: 872 | Disposition: A | Discharge status: Home / Self Care | Payer: Medicare Other | Attending: Internal Medicine | Admitting: Internal Medicine

## 2018-10-23 ENCOUNTER — Other Ambulatory Visit: Payer: Self-pay

## 2018-10-23 ENCOUNTER — Emergency Department: Payer: Medicare Other

## 2018-10-23 DIAGNOSIS — Z7984 Long term (current) use of oral hypoglycemic drugs: Secondary | ICD-10-CM

## 2018-10-23 DIAGNOSIS — J9811 Atelectasis: Secondary | ICD-10-CM | POA: Diagnosis not present

## 2018-10-23 DIAGNOSIS — M797 Fibromyalgia: Secondary | ICD-10-CM | POA: Diagnosis present

## 2018-10-23 DIAGNOSIS — E039 Hypothyroidism, unspecified: Secondary | ICD-10-CM | POA: Diagnosis present

## 2018-10-23 DIAGNOSIS — I10 Essential (primary) hypertension: Secondary | ICD-10-CM | POA: Diagnosis not present

## 2018-10-23 DIAGNOSIS — N390 Urinary tract infection, site not specified: Secondary | ICD-10-CM | POA: Diagnosis present

## 2018-10-23 DIAGNOSIS — Z79890 Hormone replacement therapy (postmenopausal): Secondary | ICD-10-CM

## 2018-10-23 DIAGNOSIS — Z7951 Long term (current) use of inhaled steroids: Secondary | ICD-10-CM

## 2018-10-23 DIAGNOSIS — K760 Fatty (change of) liver, not elsewhere classified: Secondary | ICD-10-CM | POA: Diagnosis present

## 2018-10-23 DIAGNOSIS — R6510 Systemic inflammatory response syndrome (SIRS) of non-infectious origin without acute organ dysfunction: Secondary | ICD-10-CM

## 2018-10-23 DIAGNOSIS — Z96611 Presence of right artificial shoulder joint: Secondary | ICD-10-CM | POA: Diagnosis present

## 2018-10-23 DIAGNOSIS — Z87440 Personal history of urinary (tract) infections: Secondary | ICD-10-CM

## 2018-10-23 DIAGNOSIS — E119 Type 2 diabetes mellitus without complications: Secondary | ICD-10-CM | POA: Diagnosis present

## 2018-10-23 DIAGNOSIS — Z85820 Personal history of malignant melanoma of skin: Secondary | ICD-10-CM

## 2018-10-23 DIAGNOSIS — Z833 Family history of diabetes mellitus: Secondary | ICD-10-CM

## 2018-10-23 DIAGNOSIS — G2581 Restless legs syndrome: Secondary | ICD-10-CM | POA: Diagnosis present

## 2018-10-23 DIAGNOSIS — Z6841 Body Mass Index (BMI) 40.0 and over, adult: Secondary | ICD-10-CM

## 2018-10-23 DIAGNOSIS — N3000 Acute cystitis without hematuria: Secondary | ICD-10-CM

## 2018-10-23 DIAGNOSIS — Z87891 Personal history of nicotine dependence: Secondary | ICD-10-CM

## 2018-10-23 DIAGNOSIS — E669 Obesity, unspecified: Secondary | ICD-10-CM | POA: Diagnosis present

## 2018-10-23 DIAGNOSIS — A419 Sepsis, unspecified organism: Secondary | ICD-10-CM | POA: Diagnosis not present

## 2018-10-23 DIAGNOSIS — Z96653 Presence of artificial knee joint, bilateral: Secondary | ICD-10-CM | POA: Diagnosis present

## 2018-10-23 DIAGNOSIS — Z7982 Long term (current) use of aspirin: Secondary | ICD-10-CM | POA: Diagnosis not present

## 2018-10-23 DIAGNOSIS — R509 Fever, unspecified: Secondary | ICD-10-CM | POA: Diagnosis not present

## 2018-10-23 DIAGNOSIS — Z79899 Other long term (current) drug therapy: Secondary | ICD-10-CM

## 2018-10-23 DIAGNOSIS — J45909 Unspecified asthma, uncomplicated: Secondary | ICD-10-CM | POA: Diagnosis present

## 2018-10-23 DIAGNOSIS — F410 Panic disorder [episodic paroxysmal anxiety] without agoraphobia: Secondary | ICD-10-CM | POA: Diagnosis present

## 2018-10-23 HISTORY — DX: Type 2 diabetes mellitus without complications: E11.9

## 2018-10-23 LAB — URINALYSIS, COMPLETE (UACMP) WITH MICROSCOPIC
Bilirubin Urine: NEGATIVE
Glucose, UA: NEGATIVE mg/dL
Ketones, ur: 20 mg/dL — AB
Nitrite: NEGATIVE
Protein, ur: 100 mg/dL — AB
Specific Gravity, Urine: 1.014 (ref 1.005–1.030)
WBC, UA: >50 WBC/hpf — ABNORMAL HIGH (ref 0–5)
pH: 5 (ref 5.0–8.0)

## 2018-10-23 LAB — COMPREHENSIVE METABOLIC PANEL
ALT: 24 U/L (ref 0–44)
AST: 33 U/L (ref 15–41)
Albumin: 3.4 g/dL — ABNORMAL LOW (ref 3.5–5.0)
Alkaline Phosphatase: 102 U/L (ref 38–126)
Anion gap: 14 (ref 5–15)
BUN: 23 mg/dL (ref 8–23)
CO2: 18 mmol/L — ABNORMAL LOW (ref 22–32)
Calcium: 9.1 mg/dL (ref 8.9–10.3)
Chloride: 102 mmol/L (ref 98–111)
Creatinine, Ser: 1.2 mg/dL — ABNORMAL HIGH (ref 0.44–1.00)
GFR calc Af Amer: 52 mL/min — ABNORMAL LOW (ref >60)
GFR calc non Af Amer: 45 mL/min — ABNORMAL LOW (ref >60)
Glucose, Bld: 136 mg/dL — ABNORMAL HIGH (ref 70–99)
Potassium: 3.7 mmol/L (ref 3.5–5.1)
Sodium: 134 mmol/L — ABNORMAL LOW (ref 135–145)
Total Bilirubin: 1.9 mg/dL — ABNORMAL HIGH (ref 0.3–1.2)
Total Protein: 6.9 g/dL (ref 6.5–8.1)

## 2018-10-23 LAB — CBC WITH DIFFERENTIAL/PLATELET
Abs Immature Granulocytes: 0.73 10*3/uL — ABNORMAL HIGH (ref 0.00–0.07)
Basophils Absolute: 0.2 10*3/uL — ABNORMAL HIGH (ref 0.0–0.1)
Basophils Relative: 1 %
Eosinophils Absolute: 0 10*3/uL (ref 0.0–0.5)
Eosinophils Relative: 0 %
HCT: 49.3 % — ABNORMAL HIGH (ref 36.0–46.0)
Hemoglobin: 16.1 g/dL — ABNORMAL HIGH (ref 12.0–15.0)
Immature Granulocytes: 2 %
Lymphocytes Relative: 5 %
Lymphs Abs: 1.7 10*3/uL (ref 0.7–4.0)
MCH: 30.6 pg (ref 26.0–34.0)
MCHC: 32.7 g/dL (ref 30.0–36.0)
MCV: 93.5 fL (ref 80.0–100.0)
Monocytes Absolute: 4.4 10*3/uL — ABNORMAL HIGH (ref 0.1–1.0)
Monocytes Relative: 14 %
Neutro Abs: 24.1 10*3/uL — ABNORMAL HIGH (ref 1.7–7.7)
Neutrophils Relative %: 78 %
Platelets: 196 10*3/uL (ref 150–400)
RBC: 5.27 MIL/uL — ABNORMAL HIGH (ref 3.87–5.11)
RDW: 15.1 % (ref 11.5–15.5)
WBC: 31.1 10*3/uL — ABNORMAL HIGH (ref 4.0–10.5)
nRBC: 0 % (ref 0.0–0.2)

## 2018-10-23 LAB — LACTIC ACID, PLASMA
Lactic Acid, Venous: 2 mmol/L (ref 0.5–1.9)
Lactic Acid, Venous: 1.6 mmol/L (ref 0.5–1.9)

## 2018-10-23 LAB — GLUCOSE, CAPILLARY: Glucose-Capillary: 81 mg/dL (ref 70–99)

## 2018-10-23 MED ORDER — ONDANSETRON HCL 4 MG PO TABS: 4.0000 mg | ORAL_TABLET | Freq: Four times a day (QID) | ORAL | Status: DC | PRN

## 2018-10-23 MED ORDER — SODIUM CHLORIDE 0.9 % IV BOLUS: 1000.0000 mL | Freq: Once | INTRAVENOUS | Status: DC

## 2018-10-23 MED ORDER — ACETAMINOPHEN 325 MG PO TABS
650.0000 mg | ORAL_TABLET | Freq: Four times a day (QID) | ORAL | Status: DC | PRN
  Administered 2018-10-23 – 2018-10-25 (×4): 650 mg via ORAL
  Filled 2018-10-23 (×4): qty 2

## 2018-10-23 MED ORDER — ACETAMINOPHEN 650 MG RE SUPP: 650.0000 mg | Freq: Four times a day (QID) | RECTAL | Status: DC | PRN

## 2018-10-23 MED ORDER — LORATADINE 10 MG PO TABS
10.0000 mg | ORAL_TABLET | Freq: Every day | ORAL | Status: DC
  Administered 2018-10-24 – 2018-10-25 (×2): 10 mg via ORAL
  Filled 2018-10-23 (×2): qty 1

## 2018-10-23 MED ORDER — SODIUM CHLORIDE 0.9 % IV SOLN
2.0000 g | Freq: Once | INTRAVENOUS | Status: AC
  Administered 2018-10-23: 2 g via INTRAVENOUS
  Filled 2018-10-23: qty 2

## 2018-10-23 MED ORDER — SODIUM CHLORIDE 0.9 % IV SOLN
Freq: Once | INTRAVENOUS | Status: AC
  Administered 2018-10-23: 20:00:00 via INTRAVENOUS

## 2018-10-23 MED ORDER — ROPINIROLE HCL 1 MG PO TABS
0.5000 mg | ORAL_TABLET | Freq: Every day | ORAL | Status: DC
  Administered 2018-10-23 – 2018-10-24 (×2): 0.5 mg via ORAL
  Filled 2018-10-23 (×2): qty 1

## 2018-10-23 MED ORDER — LEVOTHYROXINE SODIUM 100 MCG PO TABS
200.0000 ug | ORAL_TABLET | Freq: Every day | ORAL | Status: DC
  Administered 2018-10-24 – 2018-10-25 (×2): 200 ug via ORAL
  Filled 2018-10-23 (×2): qty 2

## 2018-10-23 MED ORDER — SODIUM CHLORIDE 0.9 % IV SOLN
1.0000 g | INTRAVENOUS | Status: DC
  Administered 2018-10-23 – 2018-10-24 (×2): 1 g via INTRAVENOUS
  Filled 2018-10-23 (×2): qty 1
  Filled 2018-10-23: qty 10

## 2018-10-23 MED ORDER — ONDANSETRON HCL 4 MG/2ML IJ SOLN: 4.0000 mg | Freq: Four times a day (QID) | INTRAMUSCULAR | Status: DC | PRN

## 2018-10-23 MED ORDER — IBUPROFEN 400 MG PO TABS
400.0000 mg | ORAL_TABLET | Freq: Four times a day (QID) | ORAL | Status: DC | PRN
  Filled 2018-10-23 (×2): qty 1

## 2018-10-23 MED ORDER — PROPRANOLOL HCL 40 MG PO TABS
40.0000 mg | ORAL_TABLET | Freq: Two times a day (BID) | ORAL | Status: DC
  Administered 2018-10-24 – 2018-10-25 (×3): 40 mg via ORAL
  Filled 2018-10-23 (×6): qty 1

## 2018-10-23 MED ORDER — DULOXETINE HCL 30 MG PO CPEP
60.0000 mg | ORAL_CAPSULE | Freq: Every day | ORAL | Status: DC
  Administered 2018-10-24 – 2018-10-25 (×2): 60 mg via ORAL
  Filled 2018-10-23 (×2): qty 2

## 2018-10-23 MED ORDER — INSULIN ASPART 100 UNIT/ML ~~LOC~~ SOLN
0.0000 [IU] | Freq: Three times a day (TID) | SUBCUTANEOUS | Status: DC
  Administered 2018-10-24 (×2): 1 [IU] via SUBCUTANEOUS
  Filled 2018-10-23 (×2): qty 1

## 2018-10-23 MED ORDER — SODIUM CHLORIDE 0.9 % IV BOLUS: 500.0000 mL | Freq: Once | INTRAVENOUS | Status: DC

## 2018-10-23 MED ORDER — SODIUM CHLORIDE 0.9 % IV BOLUS (SEPSIS): 1000.0000 mL | Freq: Once | INTRAVENOUS | Status: DC

## 2018-10-23 MED ORDER — MONTELUKAST SODIUM 10 MG PO TABS
10.0000 mg | ORAL_TABLET | Freq: Every day | ORAL | Status: DC
  Administered 2018-10-23 – 2018-10-24 (×2): 10 mg via ORAL
  Filled 2018-10-23 (×2): qty 1

## 2018-10-23 MED ORDER — ALBUTEROL SULFATE HFA 108 (90 BASE) MCG/ACT IN AERS: 2.0000 | INHALATION_SPRAY | Freq: Four times a day (QID) | RESPIRATORY_TRACT | Status: DC | PRN

## 2018-10-23 MED ORDER — FLUTICASONE FUROATE-VILANTEROL 100-25 MCG/INH IN AEPB
1.0000 | INHALATION_SPRAY | Freq: Every day | RESPIRATORY_TRACT | Status: DC
  Administered 2018-10-23 – 2018-10-24 (×2): 1 via RESPIRATORY_TRACT
  Filled 2018-10-23: qty 28

## 2018-10-23 MED ORDER — ALBUTEROL SULFATE (2.5 MG/3ML) 0.083% IN NEBU: 2.5000 mg | INHALATION_SOLUTION | RESPIRATORY_TRACT | Status: DC | PRN

## 2018-10-23 MED ORDER — HYDROCHLOROTHIAZIDE 25 MG PO TABS
12.5000 mg | ORAL_TABLET | Freq: Every day | ORAL | Status: DC
  Administered 2018-10-24 – 2018-10-25 (×2): 12.5 mg via ORAL
  Filled 2018-10-23 (×2): qty 1

## 2018-10-23 MED ORDER — TRAMADOL HCL 50 MG PO TABS
50.0000 mg | ORAL_TABLET | Freq: Four times a day (QID) | ORAL | Status: DC | PRN
  Administered 2018-10-24: 12:00:00 50 mg via ORAL
  Filled 2018-10-23: qty 1

## 2018-10-23 MED ORDER — POLYETHYLENE GLYCOL 3350 17 G PO PACK: 17.0000 g | PACK | Freq: Every day | ORAL | Status: DC | PRN

## 2018-10-23 MED ORDER — SODIUM CHLORIDE 0.9 % IV BOLUS: 1500.0000 mL | Freq: Once | INTRAVENOUS | Status: DC

## 2018-10-23 MED ORDER — ENOXAPARIN SODIUM 40 MG/0.4ML ~~LOC~~ SOLN
40.0000 mg | Freq: Two times a day (BID) | SUBCUTANEOUS | Status: DC
  Administered 2018-10-24 – 2018-10-25 (×3): 40 mg via SUBCUTANEOUS
  Filled 2018-10-23 (×4): qty 0.4

## 2018-10-23 MED ORDER — SODIUM CHLORIDE 0.9 % IV SOLN
Freq: Once | INTRAVENOUS | Status: AC
  Administered 2018-10-23: 17:00:00 via INTRAVENOUS

## 2018-10-23 MED ORDER — VANCOMYCIN HCL IN DEXTROSE 1-5 GM/200ML-% IV SOLN
1000.0000 mg | Freq: Once | INTRAVENOUS | Status: DC
  Filled 2018-10-23: qty 200

## 2018-10-23 MED ORDER — ASPIRIN EC 81 MG PO TBEC
81.0000 mg | DELAYED_RELEASE_TABLET | Freq: Every day | ORAL | Status: DC
  Administered 2018-10-24 – 2018-10-25 (×2): 81 mg via ORAL
  Filled 2018-10-23 (×2): qty 1

## 2018-10-23 MED ORDER — METFORMIN HCL ER 500 MG PO TB24
500.0000 mg | ORAL_TABLET | Freq: Every day | ORAL | Status: DC
  Administered 2018-10-24 – 2018-10-25 (×2): 500 mg via ORAL
  Filled 2018-10-23 (×2): qty 1

## 2018-10-23 NOTE — Progress Notes (Signed)
Anticoagulation monitoring(Lovenox):  72 yo female ordered Lovenox 40 mg Q24h  Filed Weights   10/23/18 1416  Weight: 260 lb (117.9 kg)   BMI 42   Lab Results  Component Value Date   CREATININE 1.20 (H) 10/23/2018   CREATININE 0.84 09/06/2018   CREATININE 1.04 12/04/2017   Estimated Creatinine Clearance: 57 mL/min (A) (by C-G formula based on SCr of 1.2 mg/dL (H)). Hemoglobin & Hematocrit     Component Value Date/Time   HGB 16.1 (H) 10/23/2018 1419   HCT 49.3 (H) 10/23/2018 1419     Per Protocol for Patient with estCrcl > 30 ml/min and BMI > 40, will transition to Lovenox 40 mg Q12h.

## 2018-10-23 NOTE — ED Notes (Signed)
Called lab to draw blood.

## 2018-10-23 NOTE — ED Provider Notes (Signed)
[facility] Emergency Department Provider Note       Time seen: ----------------------------------------- 4:03 PM on 10/23/2018 -----------------------------------------   I have reviewed the triage vital signs and the nursing notes.  HISTORY   Chief Complaint Fever    HPI Kari Medina is a 72 y.o. female with a history of asthma, depression, diabetes, fibromyalgia, hypertension, melanoma, who presents to the ED for fever for the past 2 days as well as foul odor noted to her urine.  Patient denies congestion, cough or flu symptoms.  Family states the fever has come and gone since yesterday.  She denies any other illness or complaints other than generalized weakness.  Patient states typically she is incontinent of urine  Past Medical History:  Diagnosis Date  . Asthma   . Depression   . Diabetes mellitus without complication ([place])   . Dyspnea    doe  . Dysrhythmia    extra beat  . Fatty liver   . Fibromyalgia   . Generalized osteoarthritis of multiple sites   . History of hiatal hernia   . Hypertension   . Hypothyroidism   . Melanoma ([place]) 07/2018   Right leg  . OSA (obstructive sleep apnea)   . Pain    chronic ruq and back pain  . Panic attacks   . Raynaud disease   . RLS (restless legs syndrome)   . 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 PATIENT IS UNCERTAIN  . Tremor, essential     Patient Active Problem List   Diagnosis Date Noted  . Preoperative evaluation to rule out surgical contraindication 10/11/2018  . Postmenopausal bleeding 09/28/2018  . Prediabetes 09/06/2018  . Melanoma ([place]) 07/22/2018  . Thoracic ascending aortic aneurysm ([place]) 04/07/2018  . Mood disorder ([place]) 03/02/2018  . Status post total bilateral knee replacement 12/03/2017  . Preventative health care 08/20/2017  . Urinary incontinence 08/20/2017  . Advance directive discussed with patient 08/20/2017  . Status post reverse total  shoulder replacement, right 05/28/2017  . BMI 40.0-44.9, adult ([place]) 05/22/2016  . Chronic bronchitis ([place]) 04/16/2016  . Hypertension   . Tremor, essential   . OSA (obstructive sleep apnea)   . RLS (restless legs syndrome)   . Raynaud disease   . Osteoarthrosis, generalized, multiple joints     Past Surgical History:  Procedure Laterality Date  . CHOLECYSTECTOMY    . JOINT REPLACEMENT Bilateral    2008/2011  . MELANOMA EXCISION  07/2018  . REVERSE SHOULDER ARTHROPLASTY Right 05/28/2017   Procedure: REVERSE SHOULDER ARTHROPLASTY;  Surgeon: Corky Mull, MD;  Location: [facility] ORS;  Service: Orthopedics;  Laterality: Right;  . RHINOPLASTY  1972  . THYROIDECTOMY  2006  . TOTAL KNEE ARTHROPLASTY     bilateral    Allergies Latex; Nickel; Pramipexole; Prednisone; Topiramate; Citalopram; Paroxetine hcl; and Sertraline  Social History Social History   Tobacco Use  . Smoking status: Former Smoker    Last attempt to quit: 12/22/1988    Years since quitting: 29.8  . Smokeless tobacco: Never Used  Substance Use Topics  . Alcohol use: No  . Drug use: No   Review of Systems Constitutional: Positive for fever Cardiovascular: Negative for chest pain. Respiratory: Negative for shortness of breath. Gastrointestinal: Negative for abdominal pain, vomiting and diarrhea. Genitourinary: Negative for foul-smelling urine Musculoskeletal: Negative for back pain. Skin: Negative for rash. Neurological: Negative for headaches, focal weakness or numbness.  All systems negative/normal/unremarkable except as stated in the  HPI  ____________________________________________   PHYSICAL EXAM:  VITAL SIGNS: ED Triage Vitals  Enc Vitals Group     BP 10/23/18 1418 116/64     Pulse Rate 10/23/18 1416 (!) 104     Resp 10/23/18 1416 18     Temp 10/23/18 1416 99.1 F (37.3 C)     Temp Source 10/23/18 1416 Oral     SpO2 10/23/18 1416 93 %     Weight 10/23/18 1416 260 lb (117.9 kg)     Height  10/23/18 1416 5\' 6"  (1.676 m)     Head Circumference --      Peak Flow --      Pain Score 10/23/18 1416 6     Pain Loc --      Pain Edu? --      Excl. in [place]? --    Constitutional: Alert and oriented. Well appearing and in no distress. Eyes: Conjunctivae are normal. Normal extraocular movements. ENT   Head: Normocephalic and atraumatic.   Nose: No congestion/rhinnorhea.   Mouth/Throat: Mucous membranes are moist.   Neck: No stridor. Cardiovascular: Normal rate, regular rhythm. No murmurs, rubs, or gallops. Respiratory: Normal respiratory effort without tachypnea nor retractions. Breath sounds are clear and equal bilaterally. No wheezes/rales/rhonchi. Gastrointestinal: Soft and nontender. Normal bowel sounds Musculoskeletal: Nontender with normal range of motion in extremities. No lower extremity tenderness nor edema. Neurologic:  Normal speech and language. No gross focal neurologic deficits are appreciated.  Skin:  Skin is warm, dry and intact. No rash noted. Psychiatric: Mood and affect are normal. Speech and behavior are normal.  ____________________________________________  ED COURSE:  As part of my medical decision making, I reviewed the following data within the [place] History obtained from family if available, nursing notes, old chart and ekg, as well as notes from prior ED visits. Patient presented for fever, we will assess with labs and imaging as indicated at this time.   Procedures ____________________________________________   CRITICAL CARE Performed by: Laurence Aly   Total critical care time: 15 minutes  Critical care time was exclusive of separately billable procedures and treating other patients.  Critical care was necessary to treat or prevent imminent or life-threatening deterioration.  Critical care was time spent personally by me on the following activities: development of treatment plan with patient and/or  surrogate as well as nursing, discussions with consultants, evaluation of patient's response to treatment, examination of patient, obtaining history from patient or surrogate, ordering and performing treatments and interventions, ordering and review of laboratory studies, ordering and review of radiographic studies, pulse oximetry and re-evaluation of patient's condition.   LABS (pertinent positives/negatives)  Labs Reviewed  COMPREHENSIVE METABOLIC PANEL - Abnormal; Notable for the following components:      Result Value   Sodium 134 (*)    CO2 18 (*)    Glucose, Bld 136 (*)    Creatinine, Ser 1.20 (*)    Albumin 3.4 (*)    Total Bilirubin 1.9 (*)    GFR calc non Af Amer 45 (*)    GFR calc Af Amer 52 (*)    All other components within normal limits  CBC WITH DIFFERENTIAL/PLATELET - Abnormal; Notable for the following components:   WBC 31.1 (*)    RBC 5.27 (*)    Hemoglobin 16.1 (*)    HCT 49.3 (*)    Neutro Abs 24.1 (*)    Monocytes Absolute 4.4 (*)    Basophils Absolute 0.2 (*)  Abs Immature Granulocytes 0.73 (*)    All other components within normal limits  URINALYSIS, COMPLETE (UACMP) WITH MICROSCOPIC - Abnormal; Notable for the following components:   Color, Urine AMBER (*)    APPearance CLOUDY (*)    Hgb urine dipstick MODERATE (*)    Ketones, ur 20 (*)    Protein, ur 100 (*)    Leukocytes, UA LARGE (*)    WBC, UA >50 (*)    Bacteria, UA MANY (*)    All other components within normal limits  CULTURE, BLOOD (ROUTINE X 2)  CULTURE, BLOOD (ROUTINE X 2)  URINE CULTURE  LACTIC ACID, PLASMA  LACTIC ACID, PLASMA  BLOOD GAS, VENOUS    RADIOLOGY Images were viewed by me CXR IMPRESSION: Mild bibasilar atelectasis.  ____________________________________________  DIFFERENTIAL DIAGNOSIS   UTI, pyelonephritis, sepsis, pneumonia, influenza  FINAL ASSESSMENT AND PLAN  Urosepsis   Plan: The patient had presented for fever earlier as well as foul-smelling urine.   She is normally incontinent of urine.  Patient's labs did reveal a urinary tract infection and a markedly elevated white blood cell count starting for sepsis. Patient's imaging was negative.  We have started broad-spectrum antibiotics and have sent cultures.  Currently she is normotensive and we will give 2 L of IV fluid bolus with the antibiotics.  I will discuss with the hospitalist for admission.   Laurence Aly, MD   Note: This note was generated in part or whole with voice recognition software. Voice recognition is usually quite accurate but there are transcription errors that can and very often do occur. I apologize for any typographical errors that were not detected and corrected.     Earleen Newport, MD 10/23/18 [phone]

## 2018-10-23 NOTE — ED Triage Notes (Addendum)
Pt states fever x 2 days. States 101 yesterday, took meds for it. States today fever of 102. Didn't take anything except "I took a half of a codeine left over from surgery a few years ago" A&O, mask in place. Denies cough, congestion. States foul odor to urine.

## 2018-10-23 NOTE — ED Notes (Signed)
atb started prior to blood cultures per Dr Jimmye Norman - Iv total attempted 2 this nurse - [place]

## 2018-10-23 NOTE — H&P (Signed)
[place] [facility] [place] NAME: Kari Medina    MR#:  [id number]  DATE OF BIRTH:  [birth year]  DATE OF ADMISSION:  10/23/2018  PRIMARY CARE PHYSICIAN: Venia Carbon, MD   REQUESTING/REFERRING PHYSICIAN: Dr. Jimmye Norman  CHIEF COMPLAINT:   Chief Complaint  Patient presents with  . Fever    HISTORY OF PRESENT ILLNESS:  Kari Medina  is a 72 y.o. female with a known history of diabetes, hypertension, arthritis presented to the emergency room complaining of fever 102.5/2 days and extreme fatigue.  Patient has chronic urinary frequency and incontinence.  Dark foul-smelling urine here patient has been found to have a UTI along with sepsis and significantly elevated WBC count of 31,000.  Patient is being admitted for sepsis and UTI.  Complains of mild lower abdominal pain.  No diarrhea.  Nausea but no vomiting.  PAST MEDICAL HISTORY:   Past Medical History:  Diagnosis Date  . Asthma   . Depression   . Diabetes mellitus without complication ([place])   . Dyspnea    doe  . Dysrhythmia    extra beat  . Fatty liver   . Fibromyalgia   . Generalized osteoarthritis of multiple sites   . History of hiatal hernia   . Hypertension   . Hypothyroidism   . Melanoma ([place]) 07/2018   Right leg  . OSA (obstructive sleep apnea)   . Pain    chronic ruq and back pain  . Panic attacks   . Raynaud disease   . RLS (restless legs syndrome)   . 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 PATIENT IS UNCERTAIN  . Tremor, essential     PAST SURGICAL HISTORY:   Past Surgical History:  Procedure Laterality Date  . CHOLECYSTECTOMY    . JOINT REPLACEMENT Bilateral    2008/2011  . MELANOMA EXCISION  07/2018  . REVERSE SHOULDER ARTHROPLASTY Right 05/28/2017   Procedure: REVERSE SHOULDER ARTHROPLASTY;  Surgeon: Corky Mull, MD;  Location: [facility] ORS;  Service: Orthopedics;  Laterality: Right;  . RHINOPLASTY  1972  . THYROIDECTOMY  2006  . TOTAL KNEE  ARTHROPLASTY     bilateral    SOCIAL HISTORY:   Social History   Tobacco Use  . Smoking status: Former Smoker    Last attempt to quit: 12/22/1988    Years since quitting: 29.8  . Smokeless tobacco: Never Used  Substance Use Topics  . Alcohol use: No    FAMILY HISTORY:   Family History  Problem Relation Age of Onset  . Osteoarthritis Mother   . Diabetes Mother   . Cirrhosis Mother   . Cancer Father   . Kidney cancer Father   . Bladder Cancer Father   . Heart disease Brother        stents in 1 brother  . Breast cancer Neg Hx     DRUG ALLERGIES:   Allergies  Allergen Reactions  . Latex Hives  . Nickel Hives  . Pramipexole Other (See Comments)    Caused hallucinations, says he can take name brand.  . Prednisone   . Topiramate Other (See Comments)    "spaced out"  . Citalopram Anxiety  . Paroxetine Hcl Anxiety  . Sertraline Anxiety    REVIEW OF SYSTEMS:   Review of Systems  Constitutional: Positive for chills, fever and malaise/fatigue.  HENT: Negative for sore throat.   Eyes: Negative for blurred vision, double vision and pain.  Respiratory: Negative  for cough, hemoptysis, shortness of breath and wheezing.   Cardiovascular: Negative for chest pain, palpitations, orthopnea and leg swelling.  Gastrointestinal: Positive for abdominal pain and nausea. Negative for constipation, diarrhea, heartburn and vomiting.  Genitourinary: Negative for dysuria and hematuria.  Musculoskeletal: Negative for back pain and joint pain.  Skin: Negative for rash.  Neurological: Negative for sensory change, speech change, focal weakness and headaches.  Endo/Heme/Allergies: Does not bruise/bleed easily.  Psychiatric/Behavioral: Negative for depression. The patient is not nervous/anxious.     MEDICATIONS AT HOME:   Prior to Admission medications   Medication Sig Start Date End Date Taking? Authorizing Provider  acetaminophen (TYLENOL) 650 MG CR tablet Take 650 mg by mouth 3  (three) times daily.    [provider]  aspirin 325 MG EC tablet Take 81.25 mg by mouth daily.    [provider]  b complex vitamins tablet Take 1 tablet by mouth daily.      [provider]  Bacillus Coagulans-Inulin (PROBIOTIC FORMULA PO) Take by mouth.    [provider]  BREO ELLIPTA 100-25 MCG/INH AEPB Inhale 1 puff into the lungs at bedtime. 05/06/17   [provider]  calcium carbonate (OS-CAL) 600 MG TABS Take 600 mg by mouth daily with breakfast.     [provider]  cetirizine (ZYRTEC) 10 MG tablet Take 10 mg by mouth daily.    [provider]  cholecalciferol (VITAMIN D) 1000 units tablet Take 2,000 Units by mouth 2 (two) times daily.    [provider]  DULoxetine (CYMBALTA) 60 MG capsule Take 1 capsule (60 mg total) by mouth daily. 04/07/18   Venia Carbon, MD  fluticasone (FLONASE) 50 MCG/ACT nasal spray Place 1 spray into both nostrils daily as needed. For stuffy nose 04/02/17   [provider]  glucose blood (ONETOUCH VERIO) test strip Use to check blood sugar once a day. E11.9 09/30/18   [name] I, MD  hydrochlorothiazide (HYDRODIURIL) 12.5 MG tablet TAKE 1 TABLET(12.5 MG) BY MOUTH EVERY DAY 09/22/18   [name] I, MD  Magnesium 500 MG TABS Take 500 mg by mouth 2 (two) times daily.    [provider]  metFORMIN (GLUCOPHAGE-XR) 500 MG 24 hr tablet Take 1 tablet (500 mg total) by mouth daily with breakfast. 09/09/18   Venia Carbon, MD  montelukast (SINGULAIR) 10 MG tablet TAKE 1 TABLET(10 MG) BY MOUTH AT BEDTIME 08/24/18   Venia Carbon, MD  [facility] DELICA LANCETS FINE MISC 1 each by Does not apply route daily. Use to check blood sugar once a day. E11.9 09/30/18   [name] I, MD  Potassium 99 MG TABS Take 99 mg by mouth daily.    [provider]  propranolol (INDERAL) 40 MG tablet TAKE 1 TABLET BY MOUTH TWICE DAILY 09/22/18   [name] I, MD   rOPINIRole (REQUIP) 2 MG tablet TAKE 1 TABLET(2 MG) BY MOUTH EVERY NIGHT 10/20/18   Venia Carbon, MD  SYNTHROID 200 MCG tablet TAKE 1 TABLET(200 MCG) BY MOUTH DAILY BEFORE BREAKFAST 10/08/18   Venia Carbon, MD  traMADol (ULTRAM) 50 MG tablet Take 0.5-1 tablets (25-50 mg total) by mouth 3 (three) times daily as needed. 10/11/18   Venia Carbon, MD  Turmeric Curcumin 500 MG CAPS Take 500 mg by mouth at bedtime.     [provider]  VENTOLIN HFA 108 (90 Base) MCG/ACT inhaler Inhale 2 puffs into the lungs every 6 (six) hours as needed.  For shortness of breath/wheezing 02/25/17   [provider]  vitamin C (ASCORBIC ACID) 500 MG tablet Take 500 mg by mouth daily.    [provider]     VITAL SIGNS:  Blood pressure 116/64, pulse (!) 104, temperature 99.1 F (37.3 C), temperature source Oral, resp. rate 18, height 5\' 6"  (1.676 m), weight 117.9 kg, SpO2 93 %.  PHYSICAL EXAMINATION:  Physical Exam  GENERAL:  72 y.o.-year-old patient lying in the bed with no acute distress.  Obese EYES: Pupils equal, round, reactive to light and accommodation. No scleral icterus. Extraocular muscles intact.  HEENT: Head atraumatic, normocephalic. Oropharynx and nasopharynx clear. No oropharyngeal erythema, moist oral mucosa  NECK:  Supple, no jugular venous distention. No thyroid enlargement, no tenderness.  LUNGS: Normal breath sounds bilaterally, no wheezing, rales, rhonchi. No use of accessory muscles of respiration.  CARDIOVASCULAR: S1, S2 normal. No murmurs, rubs, or gallops.  ABDOMEN: Soft, nontender, nondistended. Bowel sounds present. No organomegaly or mass.  EXTREMITIES: No pedal edema, cyanosis, or clubbing. + 2 pedal & radial pulses b/l.   NEUROLOGIC: Cranial nerves II through XII are intact. No focal Motor or sensory deficits appreciated b/l PSYCHIATRIC: The patient is alert and oriented x 3. Good affect.  SKIN: No obvious rash, lesion, or ulcer.   LABORATORY  PANEL:   CBC Recent Labs  Lab 10/23/18 1419  WBC 31.1*  HGB 16.1*  HCT 49.3*  PLT 196   ------------------------------------------------------------------------------------------------------------------  Chemistries  Recent Labs  Lab 10/23/18 1419  NA 134*  K 3.7  CL 102  CO2 18*  GLUCOSE 136*  BUN 23  CREATININE 1.20*  CALCIUM 9.1  AST 33  ALT 24  ALKPHOS 102  BILITOT 1.9*   ------------------------------------------------------------------------------------------------------------------  Cardiac Enzymes No results for input(s): TROPONINI in the last 168 hours. ------------------------------------------------------------------------------------------------------------------  RADIOLOGY:  Dg Chest 2 View  Result Date: 10/23/2018 CLINICAL DATA:  Fever for 2 days EXAM: CHEST - 2 VIEW COMPARISON:  06/30/2018 FINDINGS: Cardiac shadows within normal limits. Lungs are well aerated bilaterally. Some mild basilar atelectatic changes are seen without focal infiltrate or effusion. Postsurgical changes in the right shoulder are noted. Degenerative changes of the thoracic spine are seen. IMPRESSION: Mild bibasilar atelectasis. Electronically Signed   By: Inez Catalina M.D.   On: 10/23/2018 15:12     IMPRESSION AND PLAN:   * UTI with sepsis We will start IV ceftriaxone.  Fluid bolus.  Lactic acid pending. Urine and blood cultures. Significant elevation in WBC.  *Diabetes mellitus.  Patient on metformin at home.  Sliding scale insulin added.  Diabetic diet.  *Hypertension.  Continue home medications.  *DVT prophylaxis with Lovenox  All the records are reviewed and case discussed with ED provider. Management plans discussed with the patient, family and they are in agreement.  CODE STATUS: FULL CODE  TOTAL TIME TAKING CARE OF THIS PATIENT: 40 minutes.   Neita Carp M.D on 10/23/2018 at 5:03 PM  Between 7am to 6pm - Pager - [phone]  After 6pm go to  [email or web address] - password [email or web address] [facility]  Office  [phone]  CC: Primary care physician; Venia Carbon, MD  Note: This dictation was prepared with Dragon dictation along with smaller phrase technology. Any transcriptional errors that result from this process are unintentional.

## 2018-10-24 ENCOUNTER — Inpatient Hospital Stay: Payer: Medicare Other

## 2018-10-24 LAB — BASIC METABOLIC PANEL
Anion gap: 9 (ref 5–15)
BUN: 22 mg/dL (ref 8–23)
CO2: 22 mmol/L (ref 22–32)
Calcium: 8.5 mg/dL — ABNORMAL LOW (ref 8.9–10.3)
Chloride: 104 mmol/L (ref 98–111)
Creatinine, Ser: 1.24 mg/dL — ABNORMAL HIGH (ref 0.44–1.00)
GFR calc Af Amer: 50 mL/min — ABNORMAL LOW (ref >60)
GFR calc non Af Amer: 43 mL/min — ABNORMAL LOW (ref >60)
Glucose, Bld: 130 mg/dL — ABNORMAL HIGH (ref 70–99)
Potassium: 3.5 mmol/L (ref 3.5–5.1)
Sodium: 135 mmol/L (ref 135–145)

## 2018-10-24 LAB — GLUCOSE, CAPILLARY
Glucose-Capillary: 122 mg/dL — ABNORMAL HIGH (ref 70–99)
Glucose-Capillary: 137 mg/dL — ABNORMAL HIGH (ref 70–99)
Glucose-Capillary: 119 mg/dL — ABNORMAL HIGH (ref 70–99)
Glucose-Capillary: 145 mg/dL — ABNORMAL HIGH (ref 70–99)

## 2018-10-24 LAB — CBC
HCT: 42.8 % (ref 36.0–46.0)
Hemoglobin: 14.4 g/dL (ref 12.0–15.0)
MCH: 30.6 pg (ref 26.0–34.0)
MCHC: 33.6 g/dL (ref 30.0–36.0)
MCV: 91.1 fL (ref 80.0–100.0)
Platelets: 176 10*3/uL (ref 150–400)
RBC: 4.7 MIL/uL (ref 3.87–5.11)
RDW: 14.7 % (ref 11.5–15.5)
WBC: 26.9 10*3/uL — ABNORMAL HIGH (ref 4.0–10.5)
nRBC: 0 % (ref 0.0–0.2)

## 2018-10-24 LAB — HEMOGLOBIN A1C
Hgb A1c MFr Bld: 6.6 % — ABNORMAL HIGH (ref 4.8–5.6)
Mean Plasma Glucose: 142.72 mg/dL

## 2018-10-24 NOTE — Progress Notes (Signed)
[place] [facility] [place] NAME: Kari Medina    MR#:  [id number]  DATE OF BIRTH:  [birth year]  SUBJECTIVE:  CHIEF COMPLAINT:   Chief Complaint  Patient presents with  . Fever    Came with fever and UTI. Have c/o recurrent UTI.  REVIEW OF SYSTEMS:  CONSTITUTIONAL: Have fever, fatigue or weakness.  EYES: No blurred or double vision.  EARS, NOSE, AND THROAT: No tinnitus or ear pain.  RESPIRATORY: No cough, shortness of breath, wheezing or hemoptysis.  CARDIOVASCULAR: No chest pain, orthopnea, edema.  GASTROINTESTINAL: No nausea, vomiting, diarrhea or abdominal pain.  GENITOURINARY: No dysuria, hematuria.  ENDOCRINE: No polyuria, nocturia,  HEMATOLOGY: No anemia, easy bruising or bleeding SKIN: No rash or lesion. MUSCULOSKELETAL: No joint pain or arthritis.   NEUROLOGIC: No tingling, numbness, weakness.  PSYCHIATRY: No anxiety or depression.   ROS  DRUG ALLERGIES:   Allergies  Allergen Reactions  . Latex Hives  . Nickel Hives  . Pramipexole Other (See Comments)    Caused hallucinations, says he can take name brand.  . Prednisone     Makes her "feel crazy"  . Topiramate Other (See Comments)    "spaced out"  . Citalopram Anxiety  . Paroxetine Hcl Anxiety  . Sertraline Anxiety    VITALS:  Blood pressure (!) 119/50, pulse 87, temperature 98.4 F (36.9 C), temperature source Oral, resp. rate (!) 22, height 5\' 6"  (1.676 m), weight 117.9 kg, SpO2 91 %.  PHYSICAL EXAMINATION:  GENERAL:  72 y.o.-year-old patient lying in the bed with no acute distress.  EYES: Pupils equal, round, reactive to light and accommodation. No scleral icterus. Extraocular muscles intact.  HEENT: Head atraumatic, normocephalic. Oropharynx and nasopharynx clear.  NECK:  Supple, no jugular venous distention. No thyroid enlargement, no tenderness.  LUNGS: Normal breath sounds bilaterally, no wheezing, rales,rhonchi or crepitation. No use of accessory muscles of  respiration.  CARDIOVASCULAR: S1, S2 normal. No murmurs, rubs, or gallops.  ABDOMEN: Soft, nontender, nondistended. Bowel sounds present. No organomegaly or mass.  EXTREMITIES: No pedal edema, cyanosis, or clubbing.  NEUROLOGIC: Cranial nerves II through XII are intact. Muscle strength 5/5 in all extremities. Sensation intact. Gait not checked.  PSYCHIATRIC: The patient is alert and oriented x 3.  SKIN: No obvious rash, lesion, or ulcer.   Physical Exam LABORATORY PANEL:   CBC Recent Labs  Lab 10/24/18 0442  WBC 26.9*  HGB 14.4  HCT 42.8  PLT 176   ------------------------------------------------------------------------------------------------------------------  Chemistries  Recent Labs  Lab 10/23/18 1419 10/24/18 0442  NA 134* 135  K 3.7 3.5  CL 102 104  CO2 18* 22  GLUCOSE 136* 130*  BUN 23 22  CREATININE 1.20* 1.24*  CALCIUM 9.1 8.5*  AST 33  --   ALT 24  --   ALKPHOS 102  --   BILITOT 1.9*  --    ------------------------------------------------------------------------------------------------------------------  Cardiac Enzymes No results for input(s): TROPONINI in the last 168 hours. ------------------------------------------------------------------------------------------------------------------  RADIOLOGY:  Dg Chest 2 View  Result Date: 10/23/2018 CLINICAL DATA:  Fever for 2 days EXAM: CHEST - 2 VIEW COMPARISON:  06/30/2018 FINDINGS: Cardiac shadows within normal limits. Lungs are well aerated bilaterally. Some mild basilar atelectatic changes are seen without focal infiltrate or effusion. Postsurgical changes in the right shoulder are noted. Degenerative changes of the thoracic spine are seen. IMPRESSION: Mild bibasilar atelectasis. Electronically Signed   By: Inez Catalina M.D.   On: 10/23/2018 15:12   US Renal  Result Date: 10/24/2018 CLINICAL DATA:  Recurrent UTI. EXAM: RENAL / URINARY TRACT ULTRASOUND COMPLETE COMPARISON:  Abdominal ultrasound 02/27/2015  FINDINGS: Right Kidney: Renal measurements: 13.6 x 5.4 x 6.7 cm = volume: 259 mL . Echogenicity within normal limits. No mass or hydronephrosis visualized. Left Kidney: Renal measurements: 30.1 x 5.6 x 6.2 cm = volume: 239 mL. Echogenicity within normal limits. No mass or hydronephrosis visualized. Bladder: Appears normal for degree of bladder distention. IMPRESSION: Normal renal ultrasound. Electronically Signed   By: Fidela Salisbury M.D.   On: 10/24/2018 16:16    ASSESSMENT AND PLAN:   Active Problems:   UTI (urinary tract infection)   * UTI with sepsis  IV ceftriaxone.  Fluid bolus.  Lactic acid checks. Urine and blood cultures. Significant elevation in WBC.  Coming down some with abx now. Pt have recurrent UTIs, will get US renal and advise to follow with urology as out pt.  *Diabetes mellitus.  Patient on metformin at home.  Sliding scale insulin added.  Diabetic diet.  *Hypertension.  Continue home medications.  *DVT prophylaxis with Lovenox    All the records are reviewed and case discussed with Care Management/Social Workerr. Management plans discussed with the patient, family and they are in agreement.  CODE STATUS: Full.  TOTAL TIME TAKING CARE OF THIS PATIENT: 35 minutes.     POSSIBLE D/C IN 1-2 DAYS, DEPENDING ON CLINICAL CONDITION.   Vaughan Basta M.D on 10/24/2018   Between 7am to 6pm - Pager - [phone]  After 6pm go to [email or web address] - password [email or web address] [facility]  Office  [phone]  CC: Primary care physician; Venia Carbon, MD  Note: This dictation was prepared with Dragon dictation along with smaller phrase technology. Any transcriptional errors that result from this process are unintentional.

## 2018-10-25 LAB — CBC
HCT: 43.8 % (ref 36.0–46.0)
Hemoglobin: 14.6 g/dL (ref 12.0–15.0)
MCH: 30.5 pg (ref 26.0–34.0)
MCHC: 33.3 g/dL (ref 30.0–36.0)
MCV: 91.6 fL (ref 80.0–100.0)
Platelets: 186 10*3/uL (ref 150–400)
RBC: 4.78 MIL/uL (ref 3.87–5.11)
RDW: 14.8 % (ref 11.5–15.5)
WBC: 21.4 10*3/uL — ABNORMAL HIGH (ref 4.0–10.5)
nRBC: 0 % (ref 0.0–0.2)

## 2018-10-25 LAB — BASIC METABOLIC PANEL
Anion gap: 14 (ref 5–15)
BUN: 15 mg/dL (ref 8–23)
CO2: 21 mmol/L — ABNORMAL LOW (ref 22–32)
Calcium: 8.5 mg/dL — ABNORMAL LOW (ref 8.9–10.3)
Chloride: 103 mmol/L (ref 98–111)
Creatinine, Ser: 0.93 mg/dL (ref 0.44–1.00)
GFR calc Af Amer: >60 mL/min (ref >60)
GFR calc non Af Amer: >60 mL/min (ref >60)
Glucose, Bld: 120 mg/dL — ABNORMAL HIGH (ref 70–99)
Potassium: 2.9 mmol/L — ABNORMAL LOW (ref 3.5–5.1)
Sodium: 138 mmol/L (ref 135–145)

## 2018-10-25 LAB — POTASSIUM: Potassium: 3.7 mmol/L (ref 3.5–5.1)

## 2018-10-25 LAB — MAGNESIUM: Magnesium: 2.1 mg/dL (ref 1.7–2.4)

## 2018-10-25 LAB — URINE CULTURE
Culture: >=100000 — AB
Special Requests: NORMAL

## 2018-10-25 LAB — GLUCOSE, CAPILLARY
Glucose-Capillary: 107 mg/dL — ABNORMAL HIGH (ref 70–99)
Glucose-Capillary: 112 mg/dL — ABNORMAL HIGH (ref 70–99)

## 2018-10-25 MED ORDER — ASPIRIN 81 MG PO TBEC: 81.0000 mg | DELAYED_RELEASE_TABLET | Freq: Every day | ORAL | 0 refills | Status: DC

## 2018-10-25 MED ORDER — POTASSIUM CHLORIDE CRYS ER 20 MEQ PO TBCR
40.0000 meq | EXTENDED_RELEASE_TABLET | ORAL | Status: AC
  Administered 2018-10-25 (×2): 40 meq via ORAL
  Filled 2018-10-25 (×2): qty 2

## 2018-10-25 MED ORDER — CEFUROXIME AXETIL 250 MG PO TABS: 250.0000 mg | ORAL_TABLET | Freq: Two times a day (BID) | ORAL | 0 refills | Status: AC

## 2018-10-25 MED ORDER — POTASSIUM CHLORIDE ER 10 MEQ PO TBCR: 10.0000 meq | EXTENDED_RELEASE_TABLET | Freq: Every day | ORAL | 0 refills | Status: DC

## 2018-10-26 ENCOUNTER — Telehealth: Payer: Self-pay

## 2018-10-26 LAB — HIV ANTIBODY (ROUTINE TESTING W REFLEX): HIV Screen 4th Generation wRfx: NONREACTIVE

## 2018-10-26 NOTE — Telephone Encounter (Signed)
Left message for patient to call back to complete TCM call and follow up

## 2018-10-27 NOTE — Telephone Encounter (Signed)
Transition Care Management Follow-up Telephone Call   Date discharged? 10/25/18  How have you been since you were released from the hospital? " I am doing okay, just can't get my energy level up"   Do you understand why you were in the hospital? Yes   Do you understand the discharge instructions? Yes   Where were you discharged to? Home    Items Reviewed:  Medications reviewed: Yes  Allergies reviewed: Yes  Dietary changes reviewed: No Changes  Referrals reviewed: Appointment with Dr. Erlene Quan urologist on 11/24/18   Functional Questionnaire:   Activities of Daily Living (ADLs):   She states they are independent in the following: bathing, dressing, grooming, toileting, eating/feeding self and ambulation States they require assistance with the following: cooking/ preparing meals    Any transportation issues/concerns?: No   Any patient concerns? No   Confirmed importance and date/time of follow-up visits scheduled:   Provider Appointment booked with Dr. Silvio Pate on 11/01/18 at 8  Confirmed with patient if condition begins to worsen call PCP or go to the ER.  Patient was given the office number and encouraged to call back with question or concerns.  : Yes

## 2018-10-28 LAB — CULTURE, BLOOD (ROUTINE X 2)
Culture: NO GROWTH
Culture: NO GROWTH
Specimen Description: ADEQUATE
Specimen Description: ADEQUATE

## 2018-10-28 MED ORDER — VENTOLIN HFA 108 (90 BASE) MCG/ACT IN AERS: 2.0000 | INHALATION_SPRAY | Freq: Four times a day (QID) | RESPIRATORY_TRACT | 11 refills | Status: DC | PRN

## 2018-10-31 NOTE — Discharge Summary (Signed)
[place] at [place] NAME: Kari Medina    MR#:  [id number]  DATE OF BIRTH:  [birth year]  DATE OF ADMISSION:  10/23/2018 ADMITTING PHYSICIAN: Hillary Bow, MD  DATE OF DISCHARGE: 10/25/2018  5:11 PM  PRIMARY CARE PHYSICIAN: Venia Carbon, MD    ADMISSION DIAGNOSIS:  SIRS (systemic inflammatory response syndrome) (HCC) [R65.10] Acute cystitis without hematuria [N30.00]  DISCHARGE DIAGNOSIS:  Active Problems:   UTI (urinary tract infection)   SECONDARY DIAGNOSIS:   Past Medical History:  Diagnosis Date  . Asthma   . Depression   . Diabetes mellitus without complication ([place])   . Dyspnea    doe  . Dysrhythmia    extra beat  . Fatty liver   . Fibromyalgia   . Generalized osteoarthritis of multiple sites   . History of hiatal hernia   . Hypertension   . Hypothyroidism   . Melanoma ([place]) 07/2018   Right leg  . OSA (obstructive sleep apnea)   . Pain    chronic ruq and back pain  . Panic attacks   . Raynaud disease   . RLS (restless legs syndrome)   . 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 PATIENT IS UNCERTAIN  . Tremor, essential     HOSPITAL COURSE:   *UTI with sepsis  IV ceftriaxone. Fluid bolus. Lactic acid checks. Urine and blood cultures. Significant elevation in WBC.  Coming down some with abx now. Pt have recurrent UTIs, will get US renal and advise to follow with urology as out pt.  *Diabetes mellitus. Patient on metformin at home. Sliding scale insulin added. Diabetic diet.  *Hypertension. Continue home medications.  *DVT prophylaxis with Lovenox  DISCHARGE CONDITIONS:   Stable.  CONSULTS OBTAINED:    DRUG ALLERGIES:   Allergies  Allergen Reactions  . Latex Hives  . Nickel Hives  . Pramipexole Other (See Comments)    Caused hallucinations, says he can take name brand.  . Prednisone     Makes her "feel crazy"  . Topiramate Other (See Comments)    "spaced  out"  . Citalopram Anxiety  . Paroxetine Hcl Anxiety  . Sertraline Anxiety    DISCHARGE MEDICATIONS:   Allergies as of 10/25/2018      Reactions   Latex Hives   Nickel Hives   Pramipexole Other (See Comments)   Caused hallucinations, says he can take name brand.   Prednisone    Makes her "feel crazy"   Topiramate Other (See Comments)   "spaced out"   Citalopram Anxiety   Paroxetine Hcl Anxiety   Sertraline Anxiety      Medication List    STOP taking these medications   Potassium 99 MG Tabs     TAKE these medications   acetaminophen 650 MG CR tablet Commonly known as:  TYLENOL Take 650 mg by mouth 3 (three) times daily.   aspirin 81 MG EC tablet Take 1 tablet (81 mg total) by mouth daily. What changed:    medication strength  how much to take   b complex vitamins tablet Take 1 tablet by mouth daily.   BREO ELLIPTA 100-25 MCG/INH Aepb Generic drug:  fluticasone furoate-vilanterol Inhale 1 puff into the lungs at bedtime.   calcium carbonate 600 MG Tabs tablet Commonly known as:  OS-CAL Take 600 mg by mouth daily with breakfast.   cetirizine 10 MG tablet Commonly known as:  ZYRTEC Take 10 mg by mouth daily.  cholecalciferol 1000 units tablet Commonly known as:  VITAMIN D Take 2,000 Units by mouth 2 (two) times daily.   DULoxetine 60 MG capsule Commonly known as:  CYMBALTA Take 1 capsule (60 mg total) by mouth daily.   fluticasone 50 MCG/ACT nasal spray Commonly known as:  FLONASE Place 1 spray into both nostrils daily as needed. For stuffy nose   glucose blood test strip Use to check blood sugar once a day. E11.9   hydrochlorothiazide 12.5 MG tablet Commonly known as:  HYDRODIURIL TAKE 1 TABLET(12.5 MG) BY MOUTH EVERY DAY   Magnesium 500 MG Tabs Take 500 mg by mouth 2 (two) times daily.   metFORMIN 500 MG 24 hr tablet Commonly known as:  GLUCOPHAGE-XR Take 1 tablet (500 mg total) by mouth daily with breakfast.   montelukast 10 MG  tablet Commonly known as:  SINGULAIR TAKE 1 TABLET(10 MG) BY MOUTH AT BEDTIME   ONETOUCH DELICA LANCETS FINE Misc 1 each by Does not apply route daily. Use to check blood sugar once a day. E11.9   potassium chloride 10 MEQ tablet Commonly known as:  K-DUR Take 1 tablet (10 mEq total) by mouth daily.   PROBIOTIC FORMULA PO Take by mouth.   propranolol 40 MG tablet Commonly known as:  INDERAL TAKE 1 TABLET BY MOUTH TWICE DAILY   rOPINIRole 2 MG tablet Commonly known as:  REQUIP TAKE 1 TABLET(2 MG) BY MOUTH EVERY NIGHT   SYNTHROID 200 MCG tablet Generic drug:  levothyroxine TAKE 1 TABLET(200 MCG) BY MOUTH DAILY BEFORE BREAKFAST   traMADol 50 MG tablet Commonly known as:  ULTRAM Take 0.5-1 tablets (25-50 mg total) by mouth 3 (three) times daily as needed.   Turmeric Curcumin 500 MG Caps Take 500 mg by mouth at bedtime.   vitamin C 500 MG tablet Commonly known as:  ASCORBIC ACID Take 500 mg by mouth daily.     ASK your doctor about these medications   cefUROXime 250 MG tablet Commonly known as:  CEFTIN Take 1 tablet (250 mg total) by mouth 2 (two) times daily for 3 days. Ask about: Should I take this medication?        DISCHARGE INSTRUCTIONS:    Follow with PMD  If you experience worsening of your admission symptoms, develop shortness of breath, life threatening emergency, suicidal or homicidal thoughts you must seek medical attention immediately by calling 911 or calling your MD immediately  if symptoms less severe.  You Must read complete instructions/literature along with all the possible adverse reactions/side effects for all the Medicines you take and that have been prescribed to you. Take any new Medicines after you have completely understood and accept all the possible adverse reactions/side effects.   Please note  You were cared for by a hospitalist during your hospital stay. If you have any questions about your discharge medications or the care you  received while you were in the hospital after you are discharged, you can call the unit and asked to speak with the hospitalist on call if the hospitalist that took care of you is not available. Once you are discharged, your primary care physician will handle any further medical issues. Please note that NO REFILLS for any discharge medications will be authorized once you are discharged, as it is imperative that you return to your primary care physician (or establish a relationship with a primary care physician if you do not have one) for your aftercare needs so that they can reassess your need for medications  and monitor your lab values.    Today   CHIEF COMPLAINT:   Chief Complaint  Patient presents with  . Fever    HISTORY OF PRESENT ILLNESS:  Kari Medina  is a 72 y.o. female with a known history of diabetes, hypertension, arthritis presented to the emergency room complaining of fever 102.5/2 days and extreme fatigue.  Patient has chronic urinary frequency and incontinence.  Dark foul-smelling urine here patient has been found to have a UTI along with sepsis and significantly elevated WBC count of 31,000.  Patient is being admitted for sepsis and UTI.  Complains of mild lower abdominal pain.  No diarrhea.  Nausea but no vomiting.   VITAL SIGNS:  Blood pressure (!) 139/92, pulse 73, temperature 97.7 F (36.5 C), temperature source Oral, resp. rate 16, height 5\' 6"  (1.676 m), weight 117.9 kg, SpO2 94 %.  I/O:  No intake or output data in the 24 hours ending 10/31/18 1620  PHYSICAL EXAMINATION:   GENERAL:  72 y.o.-year-old patient lying in the bed with no acute distress.  EYES: Pupils equal, round, reactive to light and accommodation. No scleral icterus. Extraocular muscles intact.  HEENT: Head atraumatic, normocephalic. Oropharynx and nasopharynx clear.  NECK:  Supple, no jugular venous distention. No thyroid enlargement, no tenderness.  LUNGS: Normal breath sounds bilaterally, no  wheezing, rales,rhonchi or crepitation. No use of accessory muscles of respiration.  CARDIOVASCULAR: S1, S2 normal. No murmurs, rubs, or gallops.  ABDOMEN: Soft, nontender, nondistended. Bowel sounds present. No organomegaly or mass.  EXTREMITIES: No pedal edema, cyanosis, or clubbing.  NEUROLOGIC: Cranial nerves II through XII are intact. Muscle strength 5/5 in all extremities. Sensation intact. Gait not checked.  PSYCHIATRIC: The patient is alert and oriented x 3.  SKIN: No obvious rash, lesion, or ulcer.   DATA REVIEW:   CBC Recent Labs  Lab 10/25/18 0514  WBC 21.4*  HGB 14.6  HCT 43.8  PLT 186    Chemistries  Recent Labs  Lab 10/25/18 0514 10/25/18 1522  NA 138  --   K 2.9* 3.7  CL 103  --   CO2 21*  --   GLUCOSE 120*  --   BUN 15  --   CREATININE 0.93  --   CALCIUM 8.5*  --   MG 2.1  --     Cardiac Enzymes No results for input(s): TROPONINI in the last 168 hours.  Microbiology Results  Results for orders placed or performed during the hospital encounter of 10/23/18  Urine Culture     Status: Abnormal   Collection Time: 10/23/18  2:25 PM  Result Value Ref Range Status   Specimen Description   Final    URINE, CLEAN CATCH Performed at [facility], [address] [place], [place] [address]    Special Requests   Final    Normal Performed at [facility], [address] [place], [place] [address]    Culture >=100,000 COLONIES/mL ESCHERICHIA COLI (A)  Final   Report Status 10/25/2018 FINAL  Final   Organism ID, Bacteria ESCHERICHIA COLI (A)  Final      Susceptibility   Escherichia coli - MIC*    AMPICILLIN <=2 SENSITIVE Sensitive     CEFAZOLIN <=4 SENSITIVE Sensitive     CEFTRIAXONE <=1 SENSITIVE Sensitive     CIPROFLOXACIN <=0.25 SENSITIVE Sensitive     GENTAMICIN <=1 SENSITIVE Sensitive     IMIPENEM <=0.25 SENSITIVE Sensitive     NITROFURANTOIN <=16 SENSITIVE Sensitive  TRIMETH/SULFA <=20 SENSITIVE Sensitive      AMPICILLIN/SULBACTAM <=2 SENSITIVE Sensitive     PIP/TAZO <=4 SENSITIVE Sensitive     Extended ESBL NEGATIVE Sensitive     * >=100,000 COLONIES/mL ESCHERICHIA COLI  Blood Culture (routine x 2)     Status: None   Collection Time: 10/23/18  5:03 PM  Result Value Ref Range Status   Specimen Description BLOOD Blood Culture adequate volume  Final   Special Requests BOTTLES DRAWN AEROBIC ONLY BLOOD RIGHT HAND  Final   Culture   Final    NO GROWTH 5 DAYS Performed at [facility], [address] [place], [place] [address]    Report Status 10/28/2018 FINAL  Final  Blood Culture (routine x 2)     Status: None   Collection Time: 10/23/18  5:06 PM  Result Value Ref Range Status   Specimen Description BLOOD Blood Culture adequate volume  Final   Special Requests   Final    BOTTLES DRAWN AEROBIC AND ANAEROBIC BLOOD LEFT FOREARM   Culture   Final    NO GROWTH 5 DAYS Performed at [facility], [address], [place], [place] [address]    Report Status 10/28/2018 FINAL  Final    RADIOLOGY:  No results found.  EKG:   Orders placed or performed in visit on 10/11/18  . EKG 12-Lead      Management plans discussed with the patient, family and they are in agreement.  CODE STATUS:  Code Status History    Date Active Date Inactive Code Status Order ID Comments User Context   10/23/2018 1700 10/25/2018 2011 Full Code [id number]  Hillary Bow, MD ED   05/28/2017 1307 05/29/2017 1423 Full Code [id number]  Corky Mull, MD Inpatient   05/28/2017 1214 05/28/2017 1307 Full Code [id number]  Poggi, Marshall Cork, MD Inpatient    Advance Directive Documentation     Most Recent Value  Type of Advance Directive  Healthcare Power of Attorney, Living will  Pre-existing out of facility DNR order (yellow form or pink MOST form)  -  "MOST" Form in Place?  -      TOTAL TIME TAKING CARE OF THIS PATIENT: 35 minutes.    Vaughan Basta M.D on 10/31/2018 at 4:20 PM  Between 7am to 6pm -  Pager - [phone]  After 6pm go to [email or web address] - password [email or web address] [facility]  Office  [phone]  CC: Primary care physician; Venia Carbon, MD   Note: This dictation was prepared with Dragon dictation along with smaller phrase technology. Any transcriptional errors that result from this process are unintentional.

## 2018-11-01 ENCOUNTER — Ambulatory Visit (INDEPENDENT_AMBULATORY_CARE_PROVIDER_SITE_OTHER): Payer: Medicare Other | Admitting: Internal Medicine

## 2018-11-01 ENCOUNTER — Encounter: Payer: Self-pay | Admitting: Internal Medicine

## 2018-11-01 ENCOUNTER — Other Ambulatory Visit: Payer: Self-pay

## 2018-11-01 VITALS — BP 122/78 | HR 61 | Temp 97.7°F | Ht 66.0 in | Wt 250.0 lb

## 2018-11-01 DIAGNOSIS — N390 Urinary tract infection, site not specified: Secondary | ICD-10-CM

## 2018-11-01 DIAGNOSIS — R509 Fever, unspecified: Secondary | ICD-10-CM

## 2018-11-01 LAB — RENAL FUNCTION PANEL
Albumin: 3.6 g/dL (ref 3.5–5.2)
BUN: 14 mg/dL (ref 6–23)
CO2: 26 mEq/L (ref 19–32)
Calcium: 10.7 mg/dL — ABNORMAL HIGH (ref 8.4–10.5)
Chloride: 102 mEq/L (ref 96–112)
Creatinine, Ser: 0.88 mg/dL (ref 0.40–1.20)
GFR: 67.47 mL/min (ref >60.00)
Glucose, Bld: 92 mg/dL (ref 70–99)
Phosphorus: 4 mg/dL (ref 2.3–4.6)
Potassium: 3.9 mEq/L (ref 3.5–5.1)
Sodium: 137 mEq/L (ref 135–145)

## 2018-11-01 LAB — CBC
HCT: 48.6 % — ABNORMAL HIGH (ref 36.0–46.0)
Hemoglobin: 16.2 g/dL — ABNORMAL HIGH (ref 12.0–15.0)
MCHC: 33.4 g/dL (ref 30.0–36.0)
MCV: 93.3 fl (ref 78.0–100.0)
Platelets: 359 10*3/uL (ref 150.0–400.0)
RBC: 5.21 Mil/uL — ABNORMAL HIGH (ref 3.87–5.11)
RDW: 15.1 % (ref 11.5–15.5)
WBC: 14.5 10*3/uL — ABNORMAL HIGH (ref 4.0–10.5)

## 2018-11-01 NOTE — Assessment & Plan Note (Addendum)
Admitted for possible sepsis but blood cultures negative Flu like symptoms but no respiratory symptoms Only positive culture was urine---but had no specific urinary symptoms Will recheck blood work today

## 2018-11-01 NOTE — Patient Outreach (Signed)
[facility]) Care Management  11/01/2018  Jennaya Pogue [birth year] [id number]     EMMI-General Discharge RED ON EMMI ALERT Day # 4 Date: 10/30/18 Red Alert Reason: " Unfilled prescriptions? Yes"   Outreach attempt # 1 to patient. No answer at present. RN CM left HIPAA compliant voicemail message along with contact info.        Plan: RN CM will send unsuccessful outreach letter to patient. Assigned RN CM will make outreach attempt to patient within 3-4 business days.    Enzo Montgomery, RN,BSN,CCM [facility] Management Telephonic Care Management Coordinator Direct Phone: [phone] Toll Free: [phone] Fax: [phone]

## 2018-11-01 NOTE — Patient Instructions (Signed)
You can try a daily cranberry tablet to try to prevent another urinary infection.

## 2018-11-01 NOTE — Assessment & Plan Note (Signed)
No symptoms but had pyuria and a positive culture Done with the antibiotics Last UTI about a year ago Renal ultrasound normal Discussed trying daily cranberry tabs She may cancel the urology follow up

## 2018-11-01 NOTE — Progress Notes (Signed)
Subjective:    Patient ID: Kari Medina, female    DOB: 1946/05/14, 72 y.o.   MRN: [id number]  HPI Here with husband for hospital follow up  Had called in here---very sick Temperature up to 101.1---taking tylenol Then went up to 102.5 Was very achy  No cough or change in breathing though Had called pulmonologist wondering if she needed Rx---got call back and was sent to the ER Admitted for possible sepsis Reviewed hospital records  Blood cultures negative Urine culture positive for pansensitive E Coli Had no urinary symptoms though Did have some nausea No stomach pain but did have some vague back pain  She did feel better after the antibiotics WBC went down from 31-21K after 2 days  Feels better now No pain  No fever Still feels weak though---"antibiotic take something out of your system" Is taking a probiotic  Current Outpatient Medications on File Prior to Visit  Medication Sig Dispense Refill  . acetaminophen (TYLENOL) 650 MG CR tablet Take 650 mg by mouth 3 (three) times daily.    Marland Kitchen aspirin EC 81 MG EC tablet Take 1 tablet (81 mg total) by mouth daily. 30 tablet 0  . b complex vitamins tablet Take 1 tablet by mouth daily.      . Bacillus Coagulans-Inulin (PROBIOTIC FORMULA PO) Take by mouth.    Marland Kitchen BREO ELLIPTA 100-25 MCG/INH AEPB Inhale 1 puff into the lungs at bedtime.  5  . calcium carbonate (OS-CAL) 600 MG TABS Take 600 mg by mouth daily with breakfast.     . cetirizine (ZYRTEC) 10 MG tablet Take 10 mg by mouth daily.    . cholecalciferol (VITAMIN D) 1000 units tablet Take 2,000 Units by mouth 2 (two) times daily.    . DULoxetine (CYMBALTA) 60 MG capsule Take 1 capsule (60 mg total) by mouth daily. 90 capsule 3  . fluticasone (FLONASE) 50 MCG/ACT nasal spray Place 1 spray into both nostrils daily as needed. For stuffy nose  3  . glucose blood (ONETOUCH VERIO) test strip Use to check blood sugar once a day. E11.9 100 each 3  . hydrochlorothiazide  (HYDRODIURIL) 12.5 MG tablet TAKE 1 TABLET(12.5 MG) BY MOUTH EVERY DAY 30 tablet 11  . Magnesium 500 MG TABS Take 500 mg by mouth 2 (two) times daily.    . metFORMIN (GLUCOPHAGE-XR) 500 MG 24 hr tablet Take 1 tablet (500 mg total) by mouth daily with breakfast. 90 tablet 3  . montelukast (SINGULAIR) 10 MG tablet TAKE 1 TABLET(10 MG) BY MOUTH AT BEDTIME 90 tablet 0  . ONETOUCH DELICA LANCETS FINE MISC 1 each by Does not apply route daily. Use to check blood sugar once a day. E11.9 100 each 3  . potassium chloride (K-DUR) 10 MEQ tablet Take 1 tablet (10 mEq total) by mouth daily. 30 tablet 0  . propranolol (INDERAL) 40 MG tablet TAKE 1 TABLET BY MOUTH TWICE DAILY 60 tablet 11  . rOPINIRole (REQUIP) 2 MG tablet TAKE 1 TABLET(2 MG) BY MOUTH EVERY NIGHT 90 tablet 3  . SYNTHROID 200 MCG tablet TAKE 1 TABLET(200 MCG) BY MOUTH DAILY BEFORE BREAKFAST 90 tablet 3  . traMADol (ULTRAM) 50 MG tablet Take 0.5-1 tablets (25-50 mg total) by mouth 3 (three) times daily as needed. 60 tablet 0  . Turmeric Curcumin 500 MG CAPS Take 500 mg by mouth at bedtime.     . VENTOLIN HFA 108 (90 Base) MCG/ACT inhaler Inhale 2 puffs into the lungs every 6 (six) hours as  needed. For shortness of breath/wheezing 18 g 11  . vitamin C (ASCORBIC ACID) 500 MG tablet Take 500 mg by mouth daily.     No current facility-administered medications on file prior to visit.     Allergies  Allergen Reactions  . Latex Hives  . Nickel Hives  . Pramipexole Other (See Comments)    Caused hallucinations, says he can take name brand.  . Prednisone     Makes her "feel crazy"  . Topiramate Other (See Comments)    "spaced out"  . Citalopram Anxiety  . Paroxetine Hcl Anxiety  . Sertraline Anxiety    Past Medical History:  Diagnosis Date  . Asthma   . Depression   . Diabetes mellitus without complication ([place])   . Dyspnea    doe  . Dysrhythmia    extra beat  . Fatty liver   . Fibromyalgia   . Generalized osteoarthritis of multiple  sites   . History of hiatal hernia   . Hypertension   . Hypothyroidism   . Melanoma ([place]) 07/2018   Right leg  . OSA (obstructive sleep apnea)   . Pain    chronic ruq and back pain  . Panic attacks   . Raynaud disease   . RLS (restless legs syndrome)   . 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 PATIENT IS UNCERTAIN  . Tremor, essential     Past Surgical History:  Procedure Laterality Date  . CHOLECYSTECTOMY    . JOINT REPLACEMENT Bilateral    2008/2011  . MELANOMA EXCISION  07/2018  . REVERSE SHOULDER ARTHROPLASTY Right 05/28/2017   Procedure: REVERSE SHOULDER ARTHROPLASTY;  Surgeon: Corky Mull, MD;  Location: [facility] ORS;  Service: Orthopedics;  Laterality: Right;  . RHINOPLASTY  1972  . THYROIDECTOMY  2006  . TOTAL KNEE ARTHROPLASTY     bilateral    Family History  Problem Relation Age of Onset  . Osteoarthritis Mother   . Diabetes Mother   . Cirrhosis Mother   . Cancer Father   . Kidney cancer Father   . Bladder Cancer Father   . Heart disease Brother        stents in 1 brother  . Breast cancer Neg Hx     Social History   Socioeconomic History  . Marital status: Married    Spouse name: Not on file  . Number of children: 1  . Years of education: Not on file  . Highest education level: Not on file  Occupational History  . 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 Financial resource strain: Not on file  . Food insecurity:    Worry: Not on file    Inability: Not on file  . Transportation needs:    Medical: Not on file    Non-medical: Not on file  Tobacco Use  . Smoking status: Former Smoker    Last attempt to quit: 12/22/1988    Years since quitting: 29.8  . Smokeless tobacco: Never Used  Substance and Sexual Activity  . Alcohol use: No  . Drug use: No  . Sexual activity: Not on file  Lifestyle  . Physical activity:    Days per week: Not on file    Minutes per session: Not on file  . Stress: Not on file   Relationships  . Social connections:    Talks on phone: Not on file    Gets together: Not on file  Attends religious service: Not on file    Active member of club or organization: Not on file    Attends meetings of clubs or organizations: Not on file    Relationship status: Not on file  . Intimate partner violence:    Fear of current or ex partner: Not on file    Emotionally abused: Not on file    Physically abused: Not on file    Forced sexual activity: Not on file  Other Topics Concern  . Not on file  Social History Narrative   1 daughter      Has living will   Husband has health care POA. Alternate would be daughter Judson Roch   Would allow resuscitation but no prolonged machines   Not sure about feeding tubes   Review of Systems No rash No diarrhea Has lost 10# during this illness---not eating much this week    Objective:   Physical Exam  Constitutional: She appears well-developed. No distress.  Looks a little washed out  Neck: No thyromegaly present.  Cardiovascular: Normal rate, regular rhythm and normal heart sounds. Exam reveals no gallop.  No murmur heard. Respiratory: Effort normal and breath sounds normal. No respiratory distress. She has no wheezes. She has no rales.  GI: Soft. She exhibits no distension. There is no tenderness. There is no rebound and no guarding.  Musculoskeletal: She exhibits no edema.  No CVA tenderness  Lymphadenopathy:    She has no cervical adenopathy.  Psychiatric: She has a normal mood and affect. Her behavior is normal.           Assessment & Plan:

## 2018-11-04 ENCOUNTER — Other Ambulatory Visit: Payer: Self-pay

## 2018-11-04 ENCOUNTER — Encounter (HOSPITAL_BASED_OUTPATIENT_CLINIC_OR_DEPARTMENT_OTHER): Payer: Self-pay | Admitting: *Deleted

## 2018-11-04 ENCOUNTER — Other Ambulatory Visit: Payer: Self-pay | Admitting: Obstetrics and Gynecology

## 2018-11-04 NOTE — Progress Notes (Signed)
Chart reviewed by Dr. Sabra Heck. No further clearance needed for surgery 11/10/18.

## 2018-11-05 ENCOUNTER — Other Ambulatory Visit: Payer: Self-pay

## 2018-11-05 NOTE — Patient Outreach (Signed)
[facility]) Care Management  11/05/2018  Kari Medina 05/04/1946 [id number]  EMMI: general discharge red alert Referral date: 11/01/18 Referral reason: unfilled prescriptions: yes Insurance: Medicare Day #  4  Telephone call to patient regarding EMMI general discharge red alert. HIPAA verified.  Explained reason for call. Patient states she is not having any problems getting her medications filled. Patient states she has completed her antibiotics.  She reports her recent admission was due to a urinary tract infection. Patient states she had a follow up visit with her primary MD on 11/01/18.  Patient states she is not having any further symptoms. Patient states she thinks the antibiotics make her a little weak. She states her energy level has improved some since she has completed the antibiotics.  Patient states she will not have a follow up with the urologist. Patient states her primary MD advised her to follow up with him if she has any more problems.  Patient denies any further needs/ concerns.  RNCM provided patient contact phone number for the 24 hour nurse advise line number.  Patient expressed appreciation for the call.   PLAN: RNCm will closed patient due to being assessed and having no further needs.   Quinn Plowman RN,BSN,CCM [facility] Telephonic  [phone]

## 2018-11-06 LAB — BLOOD GAS, VENOUS
Acid-base deficit: 1 mmol/L (ref 0.0–2.0)
Bicarbonate: 23.5 mmol/L (ref 20.0–28.0)
FIO2: 0.21
Patient temperature: 37
pCO2, Ven: 38 mmHg — ABNORMAL LOW (ref 44.0–60.0)
pH, Ven: 7.4 (ref 7.250–7.430)

## 2018-11-08 ENCOUNTER — Encounter (HOSPITAL_BASED_OUTPATIENT_CLINIC_OR_DEPARTMENT_OTHER)
Admission: RE | Admit: 2018-11-08 | Discharge: 2018-11-08 | Disposition: A | Discharge status: Home / Self Care | Payer: Medicare Other | Source: Ambulatory Visit | Attending: Obstetrics and Gynecology | Admitting: Obstetrics and Gynecology

## 2018-11-08 DIAGNOSIS — Z7984 Long term (current) use of oral hypoglycemic drugs: Secondary | ICD-10-CM | POA: Diagnosis not present

## 2018-11-08 DIAGNOSIS — I712 Thoracic aortic aneurysm, without rupture: Secondary | ICD-10-CM | POA: Diagnosis not present

## 2018-11-08 DIAGNOSIS — M797 Fibromyalgia: Secondary | ICD-10-CM | POA: Diagnosis not present

## 2018-11-08 DIAGNOSIS — Z6841 Body Mass Index (BMI) 40.0 and over, adult: Secondary | ICD-10-CM | POA: Diagnosis not present

## 2018-11-08 DIAGNOSIS — D250 Submucous leiomyoma of uterus: Secondary | ICD-10-CM | POA: Diagnosis not present

## 2018-11-08 DIAGNOSIS — I10 Essential (primary) hypertension: Secondary | ICD-10-CM | POA: Diagnosis not present

## 2018-11-08 DIAGNOSIS — I7300 Raynaud's syndrome without gangrene: Secondary | ICD-10-CM | POA: Diagnosis not present

## 2018-11-08 DIAGNOSIS — N950 Postmenopausal bleeding: Secondary | ICD-10-CM | POA: Diagnosis not present

## 2018-11-08 DIAGNOSIS — G2581 Restless legs syndrome: Secondary | ICD-10-CM | POA: Diagnosis not present

## 2018-11-08 DIAGNOSIS — E119 Type 2 diabetes mellitus without complications: Secondary | ICD-10-CM | POA: Diagnosis not present

## 2018-11-08 DIAGNOSIS — G4733 Obstructive sleep apnea (adult) (pediatric): Secondary | ICD-10-CM | POA: Diagnosis not present

## 2018-11-08 DIAGNOSIS — Z79899 Other long term (current) drug therapy: Secondary | ICD-10-CM | POA: Diagnosis not present

## 2018-11-08 DIAGNOSIS — Z87891 Personal history of nicotine dependence: Secondary | ICD-10-CM | POA: Diagnosis not present

## 2018-11-08 LAB — COMPREHENSIVE METABOLIC PANEL
ALT: 23 U/L (ref 0–44)
AST: 28 U/L (ref 15–41)
Albumin: 3.3 g/dL — ABNORMAL LOW (ref 3.5–5.0)
Alkaline Phosphatase: 82 U/L (ref 38–126)
Anion gap: 9 (ref 5–15)
BUN: 12 mg/dL (ref 8–23)
CO2: 23 mmol/L (ref 22–32)
Calcium: 9.6 mg/dL (ref 8.9–10.3)
Chloride: 105 mmol/L (ref 98–111)
Creatinine, Ser: 1.04 mg/dL — ABNORMAL HIGH (ref 0.44–1.00)
GFR calc Af Amer: >60 mL/min (ref >60)
GFR calc non Af Amer: 53 mL/min — ABNORMAL LOW (ref >60)
Glucose, Bld: 85 mg/dL (ref 70–99)
Potassium: 4.4 mmol/L (ref 3.5–5.1)
Sodium: 137 mmol/L (ref 135–145)
Total Bilirubin: 0.6 mg/dL (ref 0.3–1.2)
Total Protein: 7.7 g/dL (ref 6.5–8.1)

## 2018-11-10 ENCOUNTER — Ambulatory Visit (HOSPITAL_BASED_OUTPATIENT_CLINIC_OR_DEPARTMENT_OTHER): Payer: Medicare Other | Admitting: Anesthesiology

## 2018-11-10 ENCOUNTER — Encounter (HOSPITAL_BASED_OUTPATIENT_CLINIC_OR_DEPARTMENT_OTHER): Payer: Self-pay

## 2018-11-10 ENCOUNTER — Encounter (HOSPITAL_BASED_OUTPATIENT_CLINIC_OR_DEPARTMENT_OTHER)
Admission: RE | Disposition: A | Discharge status: Home / Self Care | Payer: Self-pay | Source: Ambulatory Visit | Attending: Obstetrics and Gynecology

## 2018-11-10 ENCOUNTER — Other Ambulatory Visit: Payer: Self-pay

## 2018-11-10 ENCOUNTER — Ambulatory Visit (HOSPITAL_BASED_OUTPATIENT_CLINIC_OR_DEPARTMENT_OTHER)
Admission: RE | Admit: 2018-11-10 | Discharge: 2018-11-10 | Disposition: A | Discharge status: Home / Self Care | Payer: Medicare Other | Source: Ambulatory Visit | Attending: Obstetrics and Gynecology | Admitting: Obstetrics and Gynecology

## 2018-11-10 DIAGNOSIS — G2581 Restless legs syndrome: Secondary | ICD-10-CM | POA: Insufficient documentation

## 2018-11-10 DIAGNOSIS — I712 Thoracic aortic aneurysm, without rupture: Secondary | ICD-10-CM | POA: Diagnosis not present

## 2018-11-10 DIAGNOSIS — Z87891 Personal history of nicotine dependence: Secondary | ICD-10-CM | POA: Insufficient documentation

## 2018-11-10 DIAGNOSIS — I7300 Raynaud's syndrome without gangrene: Secondary | ICD-10-CM | POA: Insufficient documentation

## 2018-11-10 DIAGNOSIS — Z9889 Other specified postprocedural states: Secondary | ICD-10-CM

## 2018-11-10 DIAGNOSIS — Z7984 Long term (current) use of oral hypoglycemic drugs: Secondary | ICD-10-CM | POA: Diagnosis not present

## 2018-11-10 DIAGNOSIS — Z79899 Other long term (current) drug therapy: Secondary | ICD-10-CM | POA: Insufficient documentation

## 2018-11-10 DIAGNOSIS — E119 Type 2 diabetes mellitus without complications: Secondary | ICD-10-CM | POA: Diagnosis not present

## 2018-11-10 DIAGNOSIS — Z6841 Body Mass Index (BMI) 40.0 and over, adult: Secondary | ICD-10-CM | POA: Insufficient documentation

## 2018-11-10 DIAGNOSIS — G4733 Obstructive sleep apnea (adult) (pediatric): Secondary | ICD-10-CM | POA: Insufficient documentation

## 2018-11-10 DIAGNOSIS — M797 Fibromyalgia: Secondary | ICD-10-CM | POA: Diagnosis not present

## 2018-11-10 DIAGNOSIS — I10 Essential (primary) hypertension: Secondary | ICD-10-CM | POA: Diagnosis not present

## 2018-11-10 DIAGNOSIS — D250 Submucous leiomyoma of uterus: Secondary | ICD-10-CM | POA: Insufficient documentation

## 2018-11-10 DIAGNOSIS — N950 Postmenopausal bleeding: Secondary | ICD-10-CM | POA: Insufficient documentation

## 2018-11-10 DIAGNOSIS — N840 Polyp of corpus uteri: Secondary | ICD-10-CM | POA: Diagnosis not present

## 2018-11-10 HISTORY — PX: DILATATION & CURETTAGE/HYSTEROSCOPY WITH MYOSURE: SHX6511

## 2018-11-10 LAB — GLUCOSE, CAPILLARY
Glucose-Capillary: 121 mg/dL — ABNORMAL HIGH (ref 70–99)
Glucose-Capillary: 83 mg/dL (ref 70–99)

## 2018-11-10 SURGERY — DILATATION & CURETTAGE/HYSTEROSCOPY WITH MYOSURE
Anesthesia: General | Site: Vagina

## 2018-11-10 MED ORDER — FENTANYL CITRATE (PF) 100 MCG/2ML IJ SOLN
25.0000 ug | INTRAMUSCULAR | Status: DC | PRN
  Administered 2018-11-10: 25 ug via INTRAVENOUS

## 2018-11-10 MED ORDER — MIDAZOLAM HCL 2 MG/2ML IJ SOLN: 1.0000 mg | INTRAMUSCULAR | Status: DC | PRN

## 2018-11-10 MED ORDER — FENTANYL CITRATE (PF) 100 MCG/2ML IJ SOLN
INTRAMUSCULAR | Status: AC
  Filled 2018-11-10: qty 2

## 2018-11-10 MED ORDER — EPHEDRINE SULFATE 50 MG/ML IJ SOLN
INTRAMUSCULAR | Status: DC | PRN
  Administered 2018-11-10 (×2): 10 mg via INTRAVENOUS

## 2018-11-10 MED ORDER — SUCCINYLCHOLINE CHLORIDE 20 MG/ML IJ SOLN
INTRAMUSCULAR | Status: DC | PRN
  Administered 2018-11-10: 100 mg via INTRAVENOUS

## 2018-11-10 MED ORDER — LACTATED RINGERS IV SOLN
INTRAVENOUS | Status: DC
  Administered 2018-11-10: 11:00:00 via INTRAVENOUS

## 2018-11-10 MED ORDER — ONDANSETRON HCL 4 MG/2ML IJ SOLN
INTRAMUSCULAR | Status: DC | PRN
  Administered 2018-11-10: 4 mg via INTRAVENOUS

## 2018-11-10 MED ORDER — SODIUM CHLORIDE 0.9 % IR SOLN
Status: DC | PRN
  Administered 2018-11-10: 3000 mL

## 2018-11-10 MED ORDER — SOD CITRATE-CITRIC ACID 500-334 MG/5ML PO SOLN
ORAL | Status: AC
  Filled 2018-11-10: qty 15

## 2018-11-10 MED ORDER — SOD CITRATE-CITRIC ACID 500-334 MG/5ML PO SOLN
30.0000 mL | ORAL | Status: AC
  Administered 2018-11-10: 30 mL via ORAL
  Filled 2018-11-10: qty 15

## 2018-11-10 MED ORDER — SCOPOLAMINE 1 MG/3DAYS TD PT72: 1.0000 | MEDICATED_PATCH | Freq: Once | TRANSDERMAL | Status: DC | PRN

## 2018-11-10 MED ORDER — PROPOFOL 10 MG/ML IV BOLUS
INTRAVENOUS | Status: AC
  Filled 2018-11-10: qty 20

## 2018-11-10 MED ORDER — PROPOFOL 10 MG/ML IV BOLUS
INTRAVENOUS | Status: DC | PRN
  Administered 2018-11-10: 150 mg via INTRAVENOUS

## 2018-11-10 MED ORDER — LIDOCAINE HCL (CARDIAC) PF 100 MG/5ML IV SOSY
PREFILLED_SYRINGE | INTRAVENOUS | Status: DC | PRN
  Administered 2018-11-10: 100 mg via INTRAVENOUS

## 2018-11-10 MED ORDER — FENTANYL CITRATE (PF) 100 MCG/2ML IJ SOLN
50.0000 ug | INTRAMUSCULAR | Status: DC | PRN
  Administered 2018-11-10: 100 ug via INTRAVENOUS

## 2018-11-10 MED ORDER — ONDANSETRON HCL 4 MG/2ML IJ SOLN: 4.0000 mg | Freq: Once | INTRAMUSCULAR | Status: DC | PRN

## 2018-11-10 SURGICAL SUPPLY — 26 items
BRIEF STRETCH FOR OB PAD XXL (UNDERPADS AND DIAPERS) ×2 IMPLANT
CANISTER SUCT 3000ML PPV (MISCELLANEOUS) ×2 IMPLANT
CATH ROBINSON RED A/P 16FR (CATHETERS) ×1 IMPLANT
DEVICE MYOSURE LITE (MISCELLANEOUS) IMPLANT
DEVICE MYOSURE REACH (MISCELLANEOUS) ×1 IMPLANT
DILATOR CANAL MILEX (MISCELLANEOUS) IMPLANT
GLOVE BIOGEL PI IND STRL 7.0 (GLOVE) ×1 IMPLANT
GLOVE BIOGEL PI IND STRL 7.5 (GLOVE) ×1 IMPLANT
GLOVE BIOGEL PI INDICATOR 7.0 (GLOVE) ×2
GLOVE BIOGEL PI INDICATOR 7.5 (GLOVE) ×1
GLOVE SURG SS PI 6.5 STRL IVOR (GLOVE) ×1 IMPLANT
GLOVE SURG SS PI 7.0 STRL IVOR (GLOVE) ×2 IMPLANT
GOWN STRL REUS W/ TWL LRG LVL3 (GOWN DISPOSABLE) IMPLANT
GOWN STRL REUS W/TWL LRG LVL3 (GOWN DISPOSABLE) ×4 IMPLANT
GOWN STRL REUS W/TWL XL LVL3 (GOWN DISPOSABLE) ×1 IMPLANT
KIT PROCEDURE FLUENT (KITS) ×2 IMPLANT
MYOSURE XL FIBROID (MISCELLANEOUS)
PACK VAGINAL MINOR WOMEN LF (CUSTOM PROCEDURE TRAY) ×2 IMPLANT
PAD OB MATERNITY 4.3X12.25 (PERSONAL CARE ITEMS) ×2 IMPLANT
PAD PREP 24X48 CUFFED NSTRL (MISCELLANEOUS) ×2 IMPLANT
SEAL ROD LENS SCOPE MYOSURE (ABLATOR) ×1 IMPLANT
SLEEVE SCD COMPRESS KNEE MED (MISCELLANEOUS) ×2 IMPLANT
SUT VIC AB 2-0 SH 27 (SUTURE)
SUT VIC AB 2-0 SH 27XBRD (SUTURE) IMPLANT
SYSTEM TISS REMOVAL MYOSURE XL (MISCELLANEOUS) IMPLANT
TOWEL GREEN STERILE FF (TOWEL DISPOSABLE) ×4 IMPLANT

## 2018-11-10 NOTE — H&P (Signed)
Obstetrics and Gynecology New Patient Evaluation  Surgery Date: 11/20//2019  OBGYN Clinic: [facility]  Primary Care Provider: [name] I  Referring Provider: Jearld Fenton, NP  Chief Complaint: Post menopausal bleeding, scheduled surgery  Prior HPI: Kari Medina is a 72 y.o. Caucasian G1P1001 (No LMP recorded. Patient is postmenopausal.), seen for the above chief complaint. Her past medical history is significant for HTN. In early September, she started an [place] thyroid supplement (pt unsure of name) about two weeks before having PMB which started the night before seeing her pcp. On 9/17, she saw her PCP after noticing blood on undergarments and exam noted blood in the vaginal vault but unable to visualize cervix. She was scheduled for an u/s and referred to me. She had a 9/25 u/s done that showed 8.7cm uterus with 15mm endometrial stripe with areas of increased vascularity  Last VB was a few weeks prior to first visit in mid October 2019.   No VB or spotting since last visit. embx and pap were negative except benign polyp seen on embx. U/s showed 31mm thickened stripe that was heterogenous with increased vascularity. I d/w pt that give her findings that I recommended surgery which she was amenable to.   Review of Systems: as noted in the History of Present Illness.      Patient Active Problem List   Diagnosis Date Noted  . Prediabetes 09/06/2018  . Melanoma ([place]) 07/22/2018  . Thoracic ascending aortic aneurysm ([place]) 04/07/2018  . Mood disorder ([place]) 03/02/2018  . Status post total bilateral knee replacement 12/03/2017  . Preventative health care 08/20/2017  . Urinary incontinence 08/20/2017  . Advance directive discussed with patient 08/20/2017  . 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 BMI 40.0-44.9, adult ([place]) 05/22/2016  . Chronic bronchitis ([place]) 04/16/2016  . Hypertension   . Tremor,  essential   . OSA (obstructive sleep apnea)   . RLS (restless legs syndrome)   . Raynaud disease   . Osteoarthrosis, generalized, multiple joints     Past Medical History:      Past Medical History:  Diagnosis Date  . Asthma   . Depression   . Dyspnea    doe  . Dysrhythmia    extra beat  . Fatty liver   . Fibromyalgia   . Generalized osteoarthritis of multiple sites   . History of hiatal hernia   . Hypertension   . Hypothyroidism   . Melanoma ([place]) 07/2018   Right leg  . OSA (obstructive sleep apnea)   . Pain    chronic ruq and back pain  . Panic attacks   . Raynaud disease   . RLS (restless legs syndrome)   . 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 PATIENT IS UNCERTAIN  . Tremor, essential     Past Surgical History:       Past Surgical History:  Procedure Laterality Date  . CHOLECYSTECTOMY    . JOINT REPLACEMENT Bilateral    2008/2011  . MELANOMA EXCISION  07/2018  . REVERSE SHOULDER ARTHROPLASTY Right 05/28/2017   Procedure: REVERSE SHOULDER ARTHROPLASTY;  Surgeon: Corky Mull, MD;  Location: [facility] ORS;  Service: Orthopedics;  Laterality: Right;  . RHINOPLASTY  1972  . THYROIDECTOMY  2006  . TOTAL KNEE ARTHROPLASTY     bilateral    Past Obstetrical History:  OB History  Gravida Para Term Preterm AB Living  1 1 1     1   SAB TAB Ectopic Multiple Live Births             1       # Outcome Date GA Lbr Len/2nd Weight Sex Delivery Anes PTL Lv  1 Term      Vag-Spont       Past Gynecological History: As per HPI. Periods: age 81 History of Pap Smear(s): Yes.   Last pap several years ago, which was normal and no h/o abnormals History of HRT use: No.  Social History:  Social History        Socioeconomic History  . Marital status: Married    Spouse name: Not on file  . Number of children: 1  . Years of education: Not on file  . Highest education level: Not on  file  Occupational History  . 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 Financial resource strain: Not on file  . Food insecurity:    Worry: Not on file    Inability: Not on file  . Transportation needs:    Medical: Not on file    Non-medical: Not on file  Tobacco Use  . Smoking status: Former Smoker    Last attempt to quit: 12/22/1988    Years since quitting: 29.7  . Smokeless tobacco: Never Used  Substance and Sexual Activity  . Alcohol use: No  . Drug use: No  . Sexual activity: Not on file  Lifestyle  . Physical activity:    Days per week: Not on file    Minutes per session: Not on file  . Stress: Not on file  Relationships  . Social connections:    Talks on phone: Not on file    Gets together: Not on file    Attends religious service: Not on file    Active member of club or organization: Not on file    Attends meetings of clubs or organizations: Not on file    Relationship status: Not on file  . Intimate partner violence:    Fear of current or ex partner: Not on file    Emotionally abused: Not on file    Physically abused: Not on file    Forced sexual activity: Not on file  Other Topics Concern  . Not on file  Social History Narrative   1 daughter      Has living will   Husband has health care POA. Alternate would be daughter Judson Roch   Would allow resuscitation but no prolonged machines   Not sure about feeding tubes    Family History:       Family History  Problem Relation Age of Onset  . Osteoarthritis Mother   . Diabetes Mother   . Cirrhosis Mother   . Cancer Father   . Kidney cancer Father   . Bladder Cancer Father   . Heart disease Brother        stents in 1 brother  . Breast cancer Neg Hx     Health Maintenance:  Mammogram(s): Yes.   Date: 03/2018, BIRADS 1 Colonoscopy: Yes.   Date: 2016  Medications No current facility-administered medications on file  prior to encounter.    Current Outpatient Medications on File Prior to Encounter  Medication Sig Dispense Refill  . acetaminophen (TYLENOL) 650 MG CR tablet Take 650 mg by mouth 3 (three) times daily.    Marland Kitchen  b complex vitamins tablet Take 1 tablet by mouth daily.      . Bacillus Coagulans-Inulin (PROBIOTIC FORMULA PO) Take by mouth.    Marland Kitchen BREO ELLIPTA 100-25 MCG/INH AEPB Inhale 1 puff into the lungs at bedtime.  5  . calcium carbonate (OS-CAL) 600 MG TABS Take 600 mg by mouth daily with breakfast.     . cholecalciferol (VITAMIN D) 1000 units tablet Take 2,000 Units by mouth 2 (two) times daily.    . DULoxetine (CYMBALTA) 60 MG capsule Take 1 capsule (60 mg total) by mouth daily. 90 capsule 3  . glucose blood (ONETOUCH VERIO) test strip Use to check blood sugar once a day. E11.9 100 each 3  . hydrochlorothiazide (HYDRODIURIL) 12.5 MG tablet TAKE 1 TABLET(12.5 MG) BY MOUTH EVERY DAY 30 tablet 11  . Magnesium 500 MG TABS Take 500 mg by mouth 2 (two) times daily.    . metFORMIN (GLUCOPHAGE-XR) 500 MG 24 hr tablet Take 1 tablet (500 mg total) by mouth daily with breakfast. 90 tablet 3  . montelukast (SINGULAIR) 10 MG tablet TAKE 1 TABLET(10 MG) BY MOUTH AT BEDTIME 90 tablet 0  . ONETOUCH DELICA LANCETS FINE MISC 1 each by Does not apply route daily. Use to check blood sugar once a day. E11.9 100 each 3  . propranolol (INDERAL) 40 MG tablet TAKE 1 TABLET BY MOUTH TWICE DAILY 60 tablet 11  . SYNTHROID 200 MCG tablet TAKE 1 TABLET(200 MCG) BY MOUTH DAILY BEFORE BREAKFAST 90 tablet 3  . traMADol (ULTRAM) 50 MG tablet Take 0.5-1 tablets (25-50 mg total) by mouth 3 (three) times daily as needed. 60 tablet 0  . Turmeric Curcumin 500 MG CAPS Take 500 mg by mouth at bedtime.     . vitamin C (ASCORBIC ACID) 500 MG tablet Take 500 mg by mouth daily.    . fluticasone (FLONASE) 50 MCG/ACT nasal spray Place 1 spray into both nostrils daily as needed. For stuffy nose  3     Allergies Latex; Nickel; Pramipexole;  Prednisone; Topiramate; Citalopram; Paroxetine hcl; and Sertraline   Physical Exam:     Current Vital Signs 24h Vital Sign Ranges  T 98.3 F (36.8 C) Temp  Avg: 98.3 F (36.8 C)  Min: 98.3 F (36.8 C)  Max: 98.3 F (36.8 C)  BP 118/80 BP  Min: 118/80  Max: 118/80  HR (!) 55 Pulse  Avg: 55  Min: 55  Max: 55  RR 20 Resp  Avg: 20  Min: 20  Max: 20  SaO2 96 % Room Air SpO2  Avg: 96 %  Min: 96 %  Max: 96 %       24 Hour I/O Current Shift I/O  Time Ins Outs No intake/output data recorded. No intake/output data recorded.    General appearance: Well nourished, well developed female in no acute distress.  Cardiovascular: normal s1 and s2.  No murmurs, rubs or gallops. Respiratory:  Clear to auscultation bilateral. Normal respiratory effort Abdomen: positive bowel sounds and no masses, hernias; diffusely non tender to palpation, non distended Neuro/Psych:  Normal mood and affect.  Skin:  Warm and dry.  Lymphatic:  No inguinal lymphadenopathy.   From October 2019 Pelvic exam: is not limited by body habitus EGBUS: within normal limits with moderate atrophy, Vagina: within normal limits, with moderate atrophy and with no blood or discharge in the vault, Cervix: normal appearing cervix without tenderness, discharge or lesions. Cervix is very posterior and deep Uterus:  nonenlarged and non tender and  Adnexa:  normal adnexa and no mass, fullness, tenderness Rectovaginal: deferred   Laboratory: none  Radiology:  CLINICAL DATA: Initial evaluation for postmenopausal bleeding.  EXAM: TRANSABDOMINAL AND TRANSVAGINAL ULTRASOUND OF PELVIS  TECHNIQUE: Both transabdominal and transvaginal ultrasound examinations of the pelvis were performed. Transabdominal technique was performed for global imaging of the pelvis including uterus, ovaries, adnexal regions, and pelvic cul-de-sac. It was necessary to proceed with endovaginal exam following the transabdominal exam to visualize  the uterus, endometrium, and ovaries.  COMPARISON: None available.  FINDINGS: Uterus  Measurements: 8.7 x 5.0 x 5.5 cm. 2.8 x 3.1 x 3.1 cm probable intramural fibroid present at the right uterine fundus.  Endometrium  Thickness: 15.2 mm. Uterus demonstrates a heterogeneous echotexture with suggestion of scattered areas of increased vascularity.  Right ovary  Measurements: 1.9 x 1.6 x 1.1 cm. Normal appearance/no adnexal mass.  Left ovary  Measurements: 2.0 x 1.3 x 1.5 cm. Normal appearance/no adnexal mass.  Other findings  No definite free fluid within the pelvis.  IMPRESSION: 1. Endometrial stripe abnormally thickened up to 15 mm with heterogeneous echotexture and increased vascularity. In the setting of post-menopausal bleeding, endometrial sampling is indicated to exclude carcinoma. If results are benign, sonohysterogram should be considered for focal lesion work-up. (Ref: Radiological Reasoning: Algorithmic Workup of Abnormal Vaginal Bleeding with Endovaginal Sonography and Sonohysterography. AJR 2008; 038:B33-83). 2. 3.1 cm right fundal fibroid. 3. Normal sonographic appearance of the ovaries. No adnexal mass.   Electronically Signed By: Jeannine Boga M.D. On: 09/15/2018 22:00  Assessment: pt stable  Plan:  Pt amenable to proceeding with hysteroscopy, d&c, possible polypectomy.   Urinary incontinence: can follow up with pt at outpatient visit  RTC post op  Durene Romans MD Attending Center for [facility])

## 2018-11-10 NOTE — Anesthesia Postprocedure Evaluation (Signed)
Anesthesia Post Note  Patient: Kari Medina  Procedure(s) Performed: DILATATION & CURETTAGE/HYSTEROSCOPY WITH MYOSURE/MYOMECTOMY (N/A Vagina )     Patient location during evaluation: PACU Anesthesia Type: General Level of consciousness: awake and alert Pain management: pain level controlled Vital Signs Assessment: post-procedure vital signs reviewed and stable Respiratory status: spontaneous breathing, nonlabored ventilation, respiratory function stable and patient connected to nasal cannula oxygen Cardiovascular status: blood pressure returned to baseline and stable Postop Assessment: no apparent nausea or vomiting Anesthetic complications: no    Last Vitals:  Vitals:   11/10/18 1257 11/10/18 1336  BP:  131/63  Pulse: 63 60  Resp: 12   Temp:  36.7 C  SpO2: 97% 95%    Last Pain:  Vitals:   11/10/18 1336  TempSrc: Oral  PainSc:                  Raequan Vanschaick P Taya Ashbaugh

## 2018-11-10 NOTE — Op Note (Signed)
Operative Note   11/10/2018  PRE-OP DIAGNOSIS: postmenopausal bleeding. ?endometrial polyp   POST-OP DIAGNOSIS: postmenopausal bleeding. Submucosal fibroid   SURGEON: Surgeon(s) and Role:    * Aletha Halim, MD - Primary  ASSISTANT: None  PROCEDURE: Procedure(s): DILATATION & CURETTAGE/HYSTEROSCOPY AND MYOSURE MYOMECTOMY  ANESTHESIA: General   ESTIMATED BLOOD LOSS: 12mL  DRAINS: None   TOTAL IV FLUIDS: per anesthesia note  SPECIMENS:  Endometrial curettings and fibroid  VTE PROPHYLAXIS: SCDs to the bilateral lower extremities  ANTIBIOTICS: not indicated  FLUID DEFICIT: 712WP  COMPLICATIONS: none  DISPOSITION: PACU - hemodynamically stable.  CONDITION: stable  FINDINGS: Exam under anesthesia revealed small, mobile uterus with no masses and bilateral adnexa without masses or fullness. Hysteroscopy revealed a grossly normal appearing and atrophic uterine cavity with bilateral tubal ostia and normal appearing endocervical canal. In the uterine cavity was what appeared to be an approximately 1cm submucosal fibroid (appeared type 2) just past the anterior aspect of the internal os. There was also a 0.5cm very calcified fibroid seen a few cm past the internal os at the posterior wall of the uterus. All of the fibroid was removed anterior and a small remnant seen at the posterior, calcified fibroid  PROCEDURE IN DETAIL:  After informed consent was obtained, the patient was taken to the operating room where anesthesia was obtained without difficulty. The patient was positioned in the dorsal lithotomy position in [name].  The patient's bladder was catheterized with an in and out foley catheter.  The patient was examined under anesthesia, with the above noted findings.  The bi-valved speculum was placed inside the patient's vagina, and the the anterior lip of the cervix was seen and grasped with the tenaculum.  The uterine cavity was sounded to 8cm, and then the cervix was  easily dilated to a 17 French-Pratt dilator.  The hysteroscope was introduced, with the above noted findings. The myosure was used to remove all pathology the hystersocope was removed and the uterine cavity was curetted until a gritty texture was noted, yielding scanty endometrial curettings.  The hysteroscope was reintroduced and the cavity appeared normal.   Excellent hemostasis was noted, and all instruments were removed, with excellent hemostasis noted throughout.  She was then taken out of dorsal lithotomy. The patient tolerated the procedure well.  Sponge, lap and instrument counts were correct x2.  The patient was taken to recovery room in excellent condition.  Durene Romans MD Attending Center for [facility])

## 2018-11-10 NOTE — Anesthesia Preprocedure Evaluation (Addendum)
Anesthesia Evaluation  Patient identified by MRN, date of birth, ID band Patient awake    Reviewed: Allergy & Precautions, NPO status , Patient's Chart, lab work & pertinent test results  Airway Mallampati: III  TM Distance: >3 FB Neck ROM: Full    Dental  (+) Upper Dentures, Lower Dentures   Pulmonary asthma , sleep apnea and Continuous Positive Airway Pressure Ventilation , former smoker,    Pulmonary exam normal breath sounds clear to auscultation       Cardiovascular hypertension, Pt. on medications and Pt. on home beta blockers Normal cardiovascular exam Rhythm:Regular Rate:Normal  ECG: SR, rate 68  4.5 cm fusiform ascending aortic aneurysm   Neuro/Psych PSYCHIATRIC DISORDERS Anxiety Depression negative neurological ROS     GI/Hepatic negative GI ROS, Neg liver ROS,   Endo/Other  diabetes, Oral Hypoglycemic AgentsHypothyroidism Morbid obesity  Renal/GU negative Renal ROS     Musculoskeletal  (+) Arthritis , Fibromyalgia -RLS (restless legs syndrome)   Abdominal (+) + obese,   Peds  Hematology negative hematology ROS (+)   Anesthesia Other Findings Polyp  Reproductive/Obstetrics                            Anesthesia Physical Anesthesia Plan  ASA: III  Anesthesia Plan: General   Post-op Pain Management:    Induction: Intravenous  PONV Risk Score and Plan: 3 and Ondansetron, Dexamethasone, Midazolam and Treatment may vary due to age or medical condition  Airway Management Planned: LMA  Additional Equipment:   Intra-op Plan:   Post-operative Plan: Extubation in OR  Informed Consent: I have reviewed the patients History and Physical, chart, labs and discussed the procedure including the risks, benefits and alternatives for the proposed anesthesia with the patient or authorized representative who has indicated his/her understanding and acceptance.   Dental advisory  given  Plan Discussed with: CRNA  Anesthesia Plan Comments:         Anesthesia Quick Evaluation

## 2018-11-10 NOTE — Transfer of Care (Signed)
Immediate Anesthesia Transfer of Care Note  Patient: Kari Medina  Procedure(s) Performed: DILATATION & CURETTAGE/HYSTEROSCOPY WITH MYOSURE (N/A Vagina )  Patient Location: PACU  Anesthesia Type:General  Level of Consciousness: awake, alert  and oriented  Airway & Oxygen Therapy: Patient Spontanous Breathing and Patient connected to face mask oxygen  Post-op Assessment: Report given to RN and Post -op Vital signs reviewed and stable  Post vital signs: Reviewed and stable  Last Vitals:  Vitals Value Taken Time  BP    Temp    Pulse 75 11/10/2018 12:12 PM  Resp    SpO2 98 % 11/10/2018 12:12 PM  Vitals shown include unvalidated device data.  Last Pain:  Vitals:   11/10/18 1025  TempSrc: Oral  PainSc: 0-No pain         Complications: No apparent anesthesia complications

## 2018-11-10 NOTE — Discharge Instructions (Addendum)
We will discuss your surgery once again in detail at your post-op visit in two to four weeks. If you havent already done so, please call to make your appointment as soon as possible.  Dilation and Curettage, Care After These instructions give you information on caring for yourself after your procedure. Your doctor may also give you more specific instructions. Call your doctor if you have any problems or questions after your procedure. HOME CARE  Do not drive for 24 hours.  Wait 1 week before doing any activities that wear you out.  Do not stand for a long time.  Limit stair climbing to once or twice a day.  Rest often.  Continue with your usual diet.  Drink enough fluids to keep your pee (urine) clear or pale yellow.  If you have a hard time pooping (constipation), you may:  Take a medicine to help you go poop (laxative) as told by your doctor.  Eat more fruit and bran.  Drink more fluids.  Take showers, not baths, for as long as told by your doctor.  Do not swim or use a hot tub until your doctor says it is okay.  Have someone with you for 1day after the procedure.  Do not douche, use tampons, or have sex (intercourse) until seen by your doctor  Only take medicines as told by your doctor. Do not take aspirin. It can cause bleeding.  Keep all doctor visits. GET HELP IF:  You have cramps or pain not helped by medicine.  You have new pain in the belly (abdomen).  You have a bad smelling fluid coming from your vagina.  You have a rash.  You have problems with any medicine. GET HELP RIGHT AWAY IF:   You start to bleed more than a regular period.  You have a fever.  You have chest pain.  You have trouble breathing.  You feel dizzy or feel like passing out (fainting).  You pass out.  You have pain in the tops of your shoulders.  You have vaginal bleeding with or without clumps of blood (blood clots). MAKE SURE YOU:  Understand these  instructions.  Will watch your condition.  Will get help right away if you are not doing well or get worse. Document Released: 09/16/2008 Document Revised: 12/13/2013 Document Reviewed: 07/07/2013 [facility] Patient Information 2015 [place], [place]. This information is not intended to replace advice given to you by your health care provider. Make sure you discuss any questions you have with your health care provider.  Post Anesthesia Home Care Instructions  Activity: Get plenty of rest for the remainder of the day. A responsible individual must stay with you for 24 hours following the procedure.  For the next 24 hours, DO NOT: -Drive a car -Paediatric nurse -Drink alcoholic beverages -Take any medication unless instructed by your physician -Make any legal decisions or sign important papers.  Meals: Start with liquid foods such as gelatin or soup. Progress to regular foods as tolerated. Avoid greasy, spicy, heavy foods. If nausea and/or vomiting occur, drink only clear liquids until the nausea and/or vomiting subsides. Call your physician if vomiting continues.  Special Instructions/Symptoms: Your throat may feel dry or sore from the anesthesia or the breathing tube placed in your throat during surgery. If this causes discomfort, gargle with warm salt water. The discomfort should disappear within 24 hours.  If you had a scopolamine patch placed behind your ear for the management of post- operative nausea and/or vomiting:  1.  The medication in the patch is effective for 72 hours, after which it should be removed.  Wrap patch in a tissue and discard in the trash. Wash hands thoroughly with soap and water. 2. You may remove the patch earlier than 72 hours if you experience unpleasant side effects which may include dry mouth, dizziness or visual disturbances. 3. Avoid touching the patch. Wash your hands with soap and water after contact with the patch.

## 2018-11-10 NOTE — Anesthesia Procedure Notes (Signed)
Procedure Name: Intubation Performed by: Verita Lamb, CRNA Pre-anesthesia Checklist: Patient identified, Emergency Drugs available, Patient being monitored, Suction available and Timeout performed Patient Re-evaluated:Patient Re-evaluated prior to induction Oxygen Delivery Method: Circle system utilized Preoxygenation: Pre-oxygenation with 100% oxygen Induction Type: IV induction Ventilation: Mask ventilation without difficulty Laryngoscope Size: Mac and 4 Grade View: Grade II Tube type: Oral Tube size: 7.0 mm Number of attempts: 1 Airway Equipment and Method: Stylet Placement Confirmation: ETT inserted through vocal cords under direct vision,  positive ETCO2,  CO2 detector and breath sounds checked- equal and bilateral Secured at: 20 cm Tube secured with: Tape Dental Injury: Teeth and Oropharynx as per pre-operative assessment

## 2018-11-11 ENCOUNTER — Encounter (HOSPITAL_BASED_OUTPATIENT_CLINIC_OR_DEPARTMENT_OTHER): Payer: Self-pay | Admitting: Obstetrics and Gynecology

## 2018-11-21 ENCOUNTER — Other Ambulatory Visit: Payer: Self-pay | Admitting: Internal Medicine

## 2018-11-24 ENCOUNTER — Ambulatory Visit: Payer: Medicare Other | Admitting: Urology

## 2018-11-29 ENCOUNTER — Ambulatory Visit (INDEPENDENT_AMBULATORY_CARE_PROVIDER_SITE_OTHER): Payer: Medicare Other | Admitting: Obstetrics and Gynecology

## 2018-11-29 VITALS — BP 152/75 | HR 79 | Wt 251.0 lb

## 2018-11-29 DIAGNOSIS — Z9889 Other specified postprocedural states: Secondary | ICD-10-CM | POA: Diagnosis not present

## 2018-11-29 DIAGNOSIS — R8290 Unspecified abnormal findings in urine: Secondary | ICD-10-CM

## 2018-11-29 DIAGNOSIS — Z09 Encounter for follow-up examination after completed treatment for conditions other than malignant neoplasm: Secondary | ICD-10-CM

## 2018-11-29 LAB — POCT URINALYSIS DIPSTICK
Blood, UA: NEGATIVE
Leukocytes, UA: NEGATIVE

## 2018-11-29 NOTE — Progress Notes (Signed)
Obstetrics and Gynecology Visit Return Patient Evaluation  Appointment Date: 11/29/2018  Primary Care Provider: [place], [place] for [facility]  Chief Complaint: regular post op visit, malodorous urine  History of Present Illness:  Kari Medina is a 72 y.o. s/p hysteroscopy d&c, myosure on 11/20 for postmenopausal bleeding and ?polyp on u/s. Surgery was uncomplicated and final pathology showed benign polyp.  No vaginal bleeding, discharge, itching. Pt notes some malodorous urine, no fevers, chills, back or abdominal pain.   Review of Systems:  as noted in the History of Present Illness.   Patient Active Problem List   Diagnosis Date Noted  . UTI (urinary tract infection) 10/23/2018  . Preoperative evaluation to rule out surgical contraindication 10/11/2018  . Postmenopausal bleeding 09/28/2018  . Prediabetes 09/06/2018  . Melanoma ([place]) 07/22/2018  . Thoracic ascending aortic aneurysm ([place]) 04/07/2018  . Mood disorder ([place]) 03/02/2018  . Fever 12/03/2017  . Status post total bilateral knee replacement 12/03/2017  . Preventative health care 08/20/2017  . Urinary incontinence 08/20/2017  . Advance directive discussed with patient 08/20/2017  . 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 BMI 40.0-44.9, adult ([place]) 05/22/2016  . Chronic bronchitis ([place]) 04/16/2016  . Hypertension   . Tremor, essential   . OSA (obstructive sleep apnea)   . RLS (restless legs syndrome)   . Raynaud disease   . Osteoarthrosis, generalized, multiple joints    Medications:  [name] had no medications administered during this visit. Current Outpatient Medications  Medication Sig Dispense Refill  . acetaminophen (TYLENOL) 650 MG CR tablet Take 650 mg by mouth 3 (three) times daily.    Marland Kitchen aspirin EC 81 MG EC tablet Take 1 tablet (81 mg total) by mouth daily. 30 tablet 0  . b complex vitamins tablet Take 1 tablet by mouth  daily.      . Bacillus Coagulans-Inulin (PROBIOTIC FORMULA PO) Take by mouth.    Marland Kitchen BREO ELLIPTA 100-25 MCG/INH AEPB Inhale 1 puff into the lungs at bedtime.  5  . calcium carbonate (OS-CAL) 600 MG TABS Take 600 mg by mouth daily with breakfast.     . cholecalciferol (VITAMIN D) 1000 units tablet Take 2,000 Units by mouth 2 (two) times daily.    . DULoxetine (CYMBALTA) 60 MG capsule Take 1 capsule (60 mg total) by mouth daily. 90 capsule 3  . fluticasone (FLONASE) 50 MCG/ACT nasal spray Place 1 spray into both nostrils daily as needed. For stuffy nose  3  . glucose blood (ONETOUCH VERIO) test strip Use to check blood sugar once a day. E11.9 100 each 3  . hydrochlorothiazide (HYDRODIURIL) 12.5 MG tablet TAKE 1 TABLET(12.5 MG) BY MOUTH EVERY DAY 30 tablet 11  . Magnesium 500 MG TABS Take 500 mg by mouth 2 (two) times daily.    . metFORMIN (GLUCOPHAGE-XR) 500 MG 24 hr tablet Take 1 tablet (500 mg total) by mouth daily with breakfast. 90 tablet 3  . montelukast (SINGULAIR) 10 MG tablet TAKE 1 TABLET(10 MG) BY MOUTH AT BEDTIME 90 tablet 3  . ONETOUCH DELICA LANCETS FINE MISC 1 each by Does not apply route daily. Use to check blood sugar once a day. E11.9 100 each 3  . potassium chloride (K-DUR) 10 MEQ tablet Take 1 tablet (10 mEq total) by mouth daily. 30 tablet 0  . propranolol (INDERAL) 40 MG tablet TAKE 1 TABLET BY MOUTH TWICE DAILY 60 tablet 11  . rOPINIRole (REQUIP) 2 MG  tablet TAKE 1 TABLET(2 MG) BY MOUTH EVERY NIGHT 90 tablet 3  . SYNTHROID 200 MCG tablet TAKE 1 TABLET(200 MCG) BY MOUTH DAILY BEFORE BREAKFAST 90 tablet 3  . traMADol (ULTRAM) 50 MG tablet Take 0.5-1 tablets (25-50 mg total) by mouth 3 (three) times daily as needed. 60 tablet 0  . Turmeric Curcumin 500 MG CAPS Take 500 mg by mouth at bedtime.     . VENTOLIN HFA 108 (90 Base) MCG/ACT inhaler Inhale 2 puffs into the lungs every 6 (six) hours as needed. For shortness of breath/wheezing 18 g 11  . vitamin C (ASCORBIC ACID) 500 MG  tablet Take 500 mg by mouth daily.     No current facility-administered medications for this visit.     Allergies: is allergic to latex; nickel; pramipexole; prednisone; topiramate; citalopram; paroxetine hcl; and sertraline.  Physical Exam:  BP (!) 152/75   Pulse 79   Wt 251 lb (113.9 kg)   BMI 40.51 kg/m  Body mass index is 40.51 kg/m. General appearance: Well nourished, well developed female in no acute distress.  Abdomen: diffusely non tender to palpation, non distended Back: no cvat Neuro/Psych:  Normal mood and affect.    Pelvic exam:  deferred   U/A dip: negative  Assessment: pt doing well  Plan:  1. Malodorous urine Will check ucx. ED, PCP precautions given - Urine Culture  2. Postop check Can come off pelvic rest. D/w pt to call us if any further bleeding or spotting in the future.    RTC: PRN  Durene Romans MD Attending Center for [employer] [facility])

## 2018-12-01 LAB — URINE CULTURE

## 2018-12-06 ENCOUNTER — Telehealth: Payer: Self-pay | Admitting: *Deleted

## 2018-12-06 MED ORDER — CEFUROXIME AXETIL 250 MG PO TABS: 250.0000 mg | ORAL_TABLET | Freq: Two times a day (BID) | ORAL | 0 refills | Status: AC

## 2018-12-06 NOTE — Telephone Encounter (Signed)
-----   Message from Aletha Halim, MD sent at 12/06/2018  2:34 PM EST ----- Can you let her know that her Ucx was + and I sent in an antibiotic. Also, let her know to call her PCP as she may need a work up for recurrent UTIs. thanks

## 2018-12-06 NOTE — Addendum Note (Signed)
Addended by: Aletha Halim on: 12/06/2018 02:34 PM   Modules accepted: Orders

## 2018-12-06 NOTE — Telephone Encounter (Signed)
Left message with patient to discuss results.

## 2018-12-21 DIAGNOSIS — E119 Type 2 diabetes mellitus without complications: Secondary | ICD-10-CM | POA: Diagnosis not present

## 2018-12-21 LAB — HM DIABETES EYE EXAM

## 2018-12-23 ENCOUNTER — Encounter: Payer: Self-pay | Admitting: Internal Medicine

## 2018-12-27 ENCOUNTER — Ambulatory Visit (INDEPENDENT_AMBULATORY_CARE_PROVIDER_SITE_OTHER): Payer: Medicare Other | Admitting: Family Medicine

## 2018-12-27 ENCOUNTER — Encounter: Payer: Self-pay | Admitting: Family Medicine

## 2018-12-27 VITALS — BP 142/78 | HR 73 | Temp 97.8°F | Ht 66.0 in | Wt 252.0 lb

## 2018-12-27 DIAGNOSIS — R8290 Unspecified abnormal findings in urine: Secondary | ICD-10-CM | POA: Diagnosis not present

## 2018-12-27 LAB — POC URINALSYSI DIPSTICK (AUTOMATED)
Bilirubin, UA: NEGATIVE
Blood, UA: NEGATIVE
Glucose, UA: NEGATIVE
Ketones, UA: NEGATIVE
Leukocytes, UA: NEGATIVE
Nitrite, UA: NEGATIVE
Protein, UA: NEGATIVE
Spec Grav, UA: <=1.005 — AB (ref 1.010–1.025)
Urobilinogen, UA: 0.2 E.U./dL
pH, UA: 6 (ref 5.0–8.0)

## 2018-12-27 MED ORDER — SULFAMETHOXAZOLE-TRIMETHOPRIM 400-80 MG PO TABS: 1.0000 | ORAL_TABLET | Freq: Two times a day (BID) | ORAL | 0 refills | Status: DC

## 2018-12-27 NOTE — Patient Instructions (Signed)
Drink plenty of water and start the antibiotics today.  We'll contact you with your lab report.  Take care.   

## 2018-12-27 NOTE — Progress Notes (Signed)
She has h/o UTI with admission in 2019.  Then had D&C with f/u ucx that was positive in 11/2018 (she had a change in urine odor but not systemic sx at that point).  She has f/u with urology pending.    She last took amoxil last month prior to dental appointment.    No recent dysuria but return of change in urine odor.   duration of symptoms: in the last week.   abdominal pain: some lower abd pain for the last week.  fevers:no back pain: she has back pain at baseline.   Vomiting:no She has inc'd PO fluid intake.    Meds, vitals, and allergies reviewed.   Per HPI unless specifically indicated in ROS section   GEN: nad, alert and oriented HEENT: mucous membranes moist NECK: supple CV: rrr.  PULM: ctab, no inc wob ABD: soft, +bs, suprapubic area not tender EXT: trace BLE edema SKIN: well perfused.  BACK: no CVA pain

## 2018-12-28 LAB — URINE CULTURE
MICRO NUMBER:: 15715
SPECIMEN QUALITY:: ADEQUATE

## 2018-12-29 DIAGNOSIS — R8290 Unspecified abnormal findings in urine: Secondary | ICD-10-CM | POA: Insufficient documentation

## 2018-12-29 NOTE — Assessment & Plan Note (Signed)
Discussed with patient about colonization versus active UTI.  Unclear if she has cystitis at this point, she may have been able to drink enough water to dilute her urine so the urinalysis may be a false negative.  Check urine culture.  Start Septra.  Okay for outpatient follow-up.  She will keep follow-up with urology.

## 2019-01-10 NOTE — Progress Notes (Signed)
01/11/2019  4:39 PM   Kari Medina 1946-06-29 [id number]  Referring provider: Venia Carbon, MD [address] [place], [place] [address]  Chief Complaint  Patient presents with  . Hospitalization Follow-up    recurrent UTI   HPI: Kari Medina is a 73 y.o. White or Caucasian female that presents today for evaluation of recurrent urinary tract infections.  Within the last year she has had one documented symptomatic urinary tract infection (10/23/2018), at that time urine culture grew E. Coli. Renal US at that time was normal.  She recently underwent a dilation & curettage/hysteoscopy with myosure/myomectomy on 11/10/2018.  Following this, she was admitted for possible sepsis although her blood cultures were negative. At that time she was experiencing fever and fatigue. She was otherwise asymptomatic. Her urine returned positive and culture returned for pan-sensitive E. Coli.  In 11/2018 during a post op visit she reported malodorous urine. She was otherwise asymptomatic. UA was negative and culture was grew E. Coli.   She was last seen on 12/27/2018 by Dr. Elsie Stain at [facility] for malodorous urine and she was asymptommatic. Started on Septra. At that time Dr. Damita Dunnings discussed the difference between colonization versus an active UTI.  Urine Culture unremarkable. Phone call with patient revealed she had no improvement on Septra and continued feeling fatigued, recommended to stop septra.  She has been taking probiotics and remains well hydrated. She reports bothersome right lower abdominal pain that started gradually after her D&C. She denies constipation. Her last bowel movement this morning was difficult to pass and was hard.  She has a familial history of bladder cancer with her father. She expresses concerns about this.  She has a previous history of tobacco use (about 0.5 packs/week) for ten years. She no longer  smokes.  PMH: Past Medical History:  Diagnosis Date  . Asthma   . Depression   . Diabetes mellitus without complication ([place])   . Dyspnea    doe  . Dysrhythmia    extra beat  . Fatty liver   . Fibromyalgia   . Generalized osteoarthritis of multiple sites   . History of hiatal hernia   . Hypertension   . Hypothyroidism   . Melanoma ([place]) 07/2018   Right leg  . OSA (obstructive sleep apnea)   . Pain    chronic ruq and back pain  . Panic attacks   . Raynaud disease   . RLS (restless legs syndrome)   . 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 PATIENT IS UNCERTAIN  . Tremor, essential    Surgical History: Past Surgical History:  Procedure Laterality Date  . CHOLECYSTECTOMY    . DILATATION & CURETTAGE/HYSTEROSCOPY WITH MYOSURE N/A 11/10/2018   Procedure: DILATATION & CURETTAGE/HYSTEROSCOPY WITH MYOSURE/MYOMECTOMY;  Surgeon: Aletha Halim, MD;  Location: [facility];  Service: Gynecology;  Laterality: N/A;  possible myosure.  Please use myosure scope, do not open myosure blades but have in the room  . JOINT REPLACEMENT Bilateral    2008/2011  . MELANOMA EXCISION  07/2018  . REVERSE SHOULDER ARTHROPLASTY Right 05/28/2017   Procedure: REVERSE SHOULDER ARTHROPLASTY;  Surgeon: Corky Mull, MD;  Location: [facility];  Service: Orthopedics;  Laterality: Right;  . RHINOPLASTY  1972  . THYROIDECTOMY  2006  . TOTAL KNEE ARTHROPLASTY     bilateral   Home Medications:  Allergies as of 01/11/2019      Reactions   Latex  Hives   Nickel Hives   Pramipexole Other (See Comments)   Caused hallucinations, says he can take name brand.   Prednisone    Makes her "feel crazy"   Topiramate Other (See Comments)   "spaced out"   Citalopram Anxiety   Paroxetine Hcl Anxiety   Sertraline Anxiety      Medication List       Accurate as of January 11, 2019 11:59 PM. Always use your most recent med list.        acetaminophen 650 MG CR tablet Commonly known  as:  TYLENOL Take 650 mg by mouth 3 (three) times daily.   aspirin 81 MG EC tablet Take 1 tablet (81 mg total) by mouth daily.   b complex vitamins tablet Take 1 tablet by mouth daily.   BREO ELLIPTA 100-25 MCG/INH Aepb Generic drug:  fluticasone furoate-vilanterol Inhale 1 puff into the lungs at bedtime.   calcium carbonate 600 MG Tabs tablet Commonly known as:  OS-CAL Take 600 mg by mouth daily with breakfast.   cholecalciferol 1000 units tablet Commonly known as:  VITAMIN D Take 2,000 Units by mouth 2 (two) times daily.   DULoxetine 60 MG capsule Commonly known as:  CYMBALTA Take 1 capsule (60 mg total) by mouth daily.   fluticasone 50 MCG/ACT nasal spray Commonly known as:  FLONASE Place 1 spray into both nostrils daily as needed. For stuffy nose   glucose blood test strip Commonly known as:  ONETOUCH VERIO Use to check blood sugar once a day. E11.9   hydrochlorothiazide 12.5 MG tablet Commonly known as:  HYDRODIURIL TAKE 1 TABLET(12.5 MG) BY MOUTH EVERY DAY   Magnesium 500 MG Tabs Take 500 mg by mouth 2 (two) times daily.   metFORMIN 500 MG 24 hr tablet Commonly known as:  GLUCOPHAGE-XR Take 1 tablet (500 mg total) by mouth daily with breakfast.   montelukast 10 MG tablet Commonly known as:  SINGULAIR TAKE 1 TABLET(10 MG) BY MOUTH AT BEDTIME   ONETOUCH DELICA LANCETS FINE Misc 1 each by Does not apply route daily. Use to check blood sugar once a day. E11.9   potassium chloride 10 MEQ tablet Commonly known as:  K-DUR Take 1 tablet (10 mEq total) by mouth daily.   PROBIOTIC FORMULA PO Take by mouth.   propranolol 40 MG tablet Commonly known as:  INDERAL TAKE 1 TABLET BY MOUTH TWICE DAILY   rOPINIRole 2 MG tablet Commonly known as:  REQUIP TAKE 1 TABLET(2 MG) BY MOUTH EVERY NIGHT   SYNTHROID 200 MCG tablet Generic drug:  levothyroxine TAKE 1 TABLET(200 MCG) BY MOUTH DAILY BEFORE BREAKFAST   traMADol 50 MG tablet Commonly known as:  ULTRAM Take  0.5-1 tablets (25-50 mg total) by mouth 3 (three) times daily as needed.   Turmeric Curcumin 500 MG Caps Take 500 mg by mouth at bedtime.   VENTOLIN HFA 108 (90 Base) MCG/ACT inhaler Generic drug:  albuterol Inhale 2 puffs into the lungs every 6 (six) hours as needed. For shortness of breath/wheezing   vitamin C 500 MG tablet Commonly known as:  ASCORBIC ACID Take 500 mg by mouth daily.      Allergies:  Allergies  Allergen Reactions  . Latex Hives  . Nickel Hives  . Pramipexole Other (See Comments)    Caused hallucinations, says he can take name brand.  . Prednisone     Makes her "feel crazy"  . Topiramate Other (See Comments)    "spaced out"  . Citalopram Anxiety  .  Paroxetine Hcl Anxiety  . Sertraline Anxiety   Family History: Family History  Problem Relation Age of Onset  . Osteoarthritis Mother   . Diabetes Mother   . Cirrhosis Mother   . Cancer Father   . Kidney cancer Father   . Bladder Cancer Father   . Heart disease Brother        stents in 1 brother  . Breast cancer Neg Hx    Social History:  reports that she quit smoking about 30 years ago. She has never used smokeless tobacco. She reports that she does not drink alcohol or use drugs.  ROS: UROLOGY Frequent Urination?: No Hard to postpone urination?: Yes Burning/pain with urination?: No Get up at night to urinate?: Yes Leakage of urine?: No Urine stream starts and stops?: No Trouble starting stream?: No Do you have to strain to urinate?: No Blood in urine?: No Urinary tract infection?: No Sexually transmitted disease?: No Injury to kidneys or bladder?: No Painful intercourse?: No Weak stream?: No Currently pregnant?: No Vaginal bleeding?: No Last menstrual period?: n  Gastrointestinal Nausea?: No Vomiting?: No Indigestion/heartburn?: No Diarrhea?: No Constipation?: No  Constitutional Fever: No Night sweats?: Yes Weight loss?: No Fatigue?: Yes  Skin Skin rash/lesions?:  No Itching?: No  Eyes Blurred vision?: No Double vision?: No  Ears/Nose/Throat Sore throat?: No Sinus problems?: No  Hematologic/Lymphatic Swollen glands?: No Easy bruising?: No  Cardiovascular Leg swelling?: No Chest pain?: No  Respiratory Cough?: No Shortness of breath?: Yes  Endocrine Excessive thirst?: No  Musculoskeletal Back pain?: Yes Joint pain?: Yes  Neurological Headaches?: No Dizziness?: No  Psychologic Depression?: No Anxiety?: No  Physical Exam Vitals signs reviewed.  Constitutional:      General: She is not in acute distress. HENT:     Head: Normocephalic and atraumatic.  Pulmonary:     Effort: Pulmonary effort is normal. No respiratory distress.  Abdominal:     Palpations: Abdomen is soft.     Tenderness: There is no abdominal tenderness. There is no right CVA tenderness or left CVA tenderness.  Skin:    Findings: No bruising, lesion or rash.  Neurological:     General: No focal deficit present.     Mental Status: She is alert and oriented to person, place, and time.  Psychiatric:        Mood and Affect: Mood and affect normal.   Blood pressure (!) 149/75, pulse 88, height 5\' 6"  (1.676 m), weight 250 lb 12.8 oz (113.8 kg).  Laboratory Data: Lab Results  Component Value Date   WBC 14.5 (H) 11/01/2018   HGB 16.2 (H) 11/01/2018   HCT 48.6 (H) 11/01/2018   MCV 93.3 11/01/2018   PLT 359.0 11/01/2018   Lab Results  Component Value Date   CREATININE 1.04 (H) 11/08/2018   Lab Results  Component Value Date   HGBA1C 6.6 (H) 10/23/2018   Pertinent Imaging: Results for orders placed during the hospital encounter of 10/23/18  US RENAL   Narrative CLINICAL DATA:  Recurrent UTI.  EXAM: RENAL / URINARY TRACT ULTRASOUND COMPLETE  COMPARISON:  Abdominal ultrasound 02/27/2015  FINDINGS: Right Kidney:  Renal measurements: 13.6 x 5.4 x 6.7 cm = volume: 259 mL . Echogenicity within normal limits. No mass or  hydronephrosis visualized.  Left Kidney:  Renal measurements: 30.1 x 5.6 x 6.2 cm = volume: 239 mL. Echogenicity within normal limits. No mass or hydronephrosis visualized.  Bladder:  Appears normal for degree of bladder distention.  IMPRESSION: Normal renal ultrasound.  Electronically Signed   By: Fidela Salisbury M.D.   On: 10/24/2018 16:16   I have personally reviewed all the images today.  Assessment & Plan:    1. Malodorous Urine  - I explained the criteria for recurrent UTI as >=2 infections within a 6 month period of >=3 infections in a year. She does not meet the criteria  - Patient is instructed to continue increased water intake until the urine is pale yellow or clear (10 to 12 cups daily). She was also instructed to continue to take probiotics (yogurt, oral pills or vaginal suppositories), implement cranberry pills or drink the juice and Vitamin C 1,000 mg daily to acidify the urine   - Avoid soaking in tubs and wipe front to back after urinating   - Advised to call office when she suspects a UTI so she can have CATH UA's for urinalysis and culture to prevent skin, vaginal and/or rectal contamination of the specimen  - I reviewed symptoms of UTI (fevers, chills, gross hematuria, mental status changes, dysuria, suprapubic pain, back pain and/or sudden worsening of urinary symptoms) and advised not to have urine checked or be treated for UTI if not experiencing symptoms  - discussed antibiotic stewardship with the patient - explained the risk of increasing risk of antibiotic resistance with continuous exposure to antibiotics, renal failure, hypoglycemia, C. Diff infection, allergic reactions, etc.    2. Constipation  - Patient is advised to start Dulcolax, MOM or Mira Lax OTC to achieve daily bowel movements  - Explained that the pain she described could be due to her constipation  3. Right lower abdominal pain  - Etiology not of GU origin  Return for cath UA with  symptoms of UTI.  [facility] [address] [place], [place] [address] [phone]  I, Stephania Fragmin , am acting as a scribe for Hollice Espy, MD  I have reviewed the above documentation for accuracy and completeness, and I agree with the above.   Hollice Espy, MD  I spent 25  min with this patient of which greater than 50% was spent in counseling and coordination of care with the patient.

## 2019-01-11 ENCOUNTER — Ambulatory Visit (INDEPENDENT_AMBULATORY_CARE_PROVIDER_SITE_OTHER): Payer: Medicare Other | Admitting: Urology

## 2019-01-11 ENCOUNTER — Encounter: Payer: Self-pay | Admitting: Urology

## 2019-01-11 ENCOUNTER — Other Ambulatory Visit: Payer: Self-pay

## 2019-01-11 VITALS — BP 149/75 | HR 88 | Ht 66.0 in | Wt 250.8 lb

## 2019-01-11 DIAGNOSIS — K5904 Chronic idiopathic constipation: Secondary | ICD-10-CM

## 2019-01-11 DIAGNOSIS — R8290 Unspecified abnormal findings in urine: Secondary | ICD-10-CM

## 2019-01-11 DIAGNOSIS — R1031 Right lower quadrant pain: Secondary | ICD-10-CM | POA: Diagnosis not present

## 2019-01-11 DIAGNOSIS — G8929 Other chronic pain: Secondary | ICD-10-CM

## 2019-01-11 NOTE — Patient Instructions (Signed)
Return as needed for cath UA

## 2019-01-31 DIAGNOSIS — Z08 Encounter for follow-up examination after completed treatment for malignant neoplasm: Secondary | ICD-10-CM | POA: Diagnosis not present

## 2019-01-31 DIAGNOSIS — D225 Melanocytic nevi of trunk: Secondary | ICD-10-CM | POA: Diagnosis not present

## 2019-01-31 DIAGNOSIS — D2262 Melanocytic nevi of left upper limb, including shoulder: Secondary | ICD-10-CM | POA: Diagnosis not present

## 2019-01-31 DIAGNOSIS — D2272 Melanocytic nevi of left lower limb, including hip: Secondary | ICD-10-CM | POA: Diagnosis not present

## 2019-01-31 DIAGNOSIS — Z85820 Personal history of malignant melanoma of skin: Secondary | ICD-10-CM | POA: Diagnosis not present

## 2019-01-31 DIAGNOSIS — D2271 Melanocytic nevi of right lower limb, including hip: Secondary | ICD-10-CM | POA: Diagnosis not present

## 2019-01-31 DIAGNOSIS — D2261 Melanocytic nevi of right upper limb, including shoulder: Secondary | ICD-10-CM | POA: Diagnosis not present

## 2019-04-14 ENCOUNTER — Other Ambulatory Visit: Payer: Self-pay | Admitting: Internal Medicine

## 2019-06-01 DIAGNOSIS — Z6838 Body mass index (BMI) 38.0-38.9, adult: Secondary | ICD-10-CM | POA: Diagnosis not present

## 2019-06-01 DIAGNOSIS — C4371 Malignant melanoma of right lower limb, including hip: Secondary | ICD-10-CM | POA: Diagnosis not present

## 2019-07-16 ENCOUNTER — Other Ambulatory Visit: Payer: Self-pay | Admitting: Internal Medicine

## 2019-07-29 ENCOUNTER — Other Ambulatory Visit: Payer: Self-pay | Admitting: *Deleted

## 2019-07-29 DIAGNOSIS — I712 Thoracic aortic aneurysm, without rupture, unspecified: Secondary | ICD-10-CM

## 2019-08-12 ENCOUNTER — Other Ambulatory Visit: Payer: Self-pay | Admitting: Internal Medicine

## 2019-08-26 ENCOUNTER — Other Ambulatory Visit: Payer: Self-pay | Admitting: Internal Medicine

## 2019-08-31 ENCOUNTER — Encounter: Payer: Medicare Other | Admitting: Surgery

## 2019-08-31 ENCOUNTER — Ambulatory Visit
Admission: RE | Admit: 2019-08-31 | Discharge: 2019-08-31 | Disposition: A | Discharge status: Home / Self Care | Payer: Medicare Other | Source: Ambulatory Visit | Attending: Surgery | Admitting: Surgery

## 2019-08-31 DIAGNOSIS — I712 Thoracic aortic aneurysm, without rupture, unspecified: Secondary | ICD-10-CM

## 2019-09-07 ENCOUNTER — Ambulatory Visit (INDEPENDENT_AMBULATORY_CARE_PROVIDER_SITE_OTHER): Payer: Medicare Other | Admitting: Surgery

## 2019-09-07 ENCOUNTER — Encounter: Payer: Self-pay | Admitting: Surgery

## 2019-09-07 ENCOUNTER — Other Ambulatory Visit: Payer: Self-pay

## 2019-09-07 VITALS — BP 160/90 | HR 80 | Temp 97.9°F | Resp 20 | Ht 66.0 in | Wt 253.0 lb

## 2019-09-07 DIAGNOSIS — I7121 Aneurysm of the ascending aorta, without rupture: Secondary | ICD-10-CM

## 2019-09-07 DIAGNOSIS — R918 Other nonspecific abnormal finding of lung field: Secondary | ICD-10-CM | POA: Diagnosis not present

## 2019-09-07 DIAGNOSIS — I712 Thoracic aortic aneurysm, without rupture: Secondary | ICD-10-CM

## 2019-09-07 NOTE — Progress Notes (Signed)
HPI:  She is a 73 year old woman who returns today for follow-up of a 4.5 cm fusiform ascending aortic aneurysm and a 10 x 14 mm nodule in the posterior aspect of the right lung apex as well as a 4 mm right upper lobe nodule. I last saw her on 08/27/2018 and the aneurysm was measured at 4.3 cm. She continues to complain of shortness of breath. She is followed by a pulmonologist for that with restrictive lung disease on spirometry. She says she has lost 45 lb over the past year.  Current Outpatient Medications  Medication Sig Dispense Refill  . acetaminophen (TYLENOL) 650 MG CR tablet Take 650 mg by mouth 3 (three) times daily.    Marland Kitchen aspirin EC 81 MG EC tablet Take 1 tablet (81 mg total) by mouth daily. 30 tablet 0  . b complex vitamins tablet Take 1 tablet by mouth daily.      . Bacillus Coagulans-Inulin (PROBIOTIC FORMULA PO) Take by mouth.    Marland Kitchen BREO ELLIPTA 100-25 MCG/INH AEPB Inhale 1 puff into the lungs at bedtime.  5  . calcium carbonate (OS-CAL) 600 MG TABS Take 600 mg by mouth daily with breakfast.     . cholecalciferol (VITAMIN D) 1000 units tablet Take 2,000 Units by mouth 2 (two) times daily.    . DULoxetine (CYMBALTA) 60 MG capsule TAKE 1 CAPSULE(60 MG) BY MOUTH DAILY 90 capsule 3  . fluticasone (FLONASE) 50 MCG/ACT nasal spray Place 1 spray into both nostrils daily as needed. For stuffy nose  3  . glucose blood (ONETOUCH VERIO) test strip Use to check blood sugar once a day. E11.9 100 each 3  . hydrochlorothiazide (HYDRODIURIL) 12.5 MG tablet TAKE 1 TABLET(12.5 MG) BY MOUTH EVERY DAY 90 tablet 3  . Magnesium 500 MG TABS Take 500 mg by mouth 2 (two) times daily.    . metFORMIN (GLUCOPHAGE-XR) 500 MG 24 hr tablet TAKE 1 TABLET(500 MG) BY MOUTH DAILY WITH BREAKFAST 90 tablet 3  . montelukast (SINGULAIR) 10 MG tablet TAKE 1 TABLET(10 MG) BY MOUTH AT BEDTIME 90 tablet 3  . ONETOUCH DELICA LANCETS FINE MISC 1 each by Does not apply route daily. Use to check blood sugar once a day.  E11.9 100 each 3  . OVER THE COUNTER MEDICATION EYE SUPPLEMENT...VISION ALIVE    . OVER THE COUNTER MEDICATION THYROID SUPPLEMENT    . OVER THE COUNTER MEDICATION BETTER BLADDER SUPPLEMENT    . potassium chloride (K-DUR) 10 MEQ tablet Take 1 tablet (10 mEq total) by mouth daily. 30 tablet 0  . propranolol (INDERAL) 40 MG tablet TAKE 1 TABLET BY MOUTH TWICE DAILY 180 tablet 3  . rOPINIRole (REQUIP) 2 MG tablet TAKE 1 TABLET(2 MG) BY MOUTH EVERY NIGHT 90 tablet 3  . SYNTHROID 200 MCG tablet TAKE 1 TABLET(200 MCG) BY MOUTH DAILY BEFORE BREAKFAST 90 tablet 3  . traMADol (ULTRAM) 50 MG tablet Take 0.5-1 tablets (25-50 mg total) by mouth 3 (three) times daily as needed. 60 tablet 0  . Turmeric Curcumin 500 MG CAPS Take 500 mg by mouth at bedtime.     . VENTOLIN HFA 108 (90 Base) MCG/ACT inhaler Inhale 2 puffs into the lungs every 6 (six) hours as needed. For shortness of breath/wheezing 18 g 11  . vitamin C (ASCORBIC ACID) 500 MG tablet Take 500 mg by mouth daily.     No current facility-administered medications for this visit.      Physical Exam: BP (!) 160/90  Pulse 80   Temp 97.9 F (36.6 C) (Skin)   Resp 20   Ht 5\' 6"  (1.676 m)   Wt 253 lb (114.8 kg)   SpO2 90% Comment: RA  BMI 40.84 kg/m  She looks well Cardiac exam shows a regular rate and rhythm with normal heart sounds. There is no murmur Lungs are clear  Diagnostic Tests:  CLINICAL DATA:  Thoracic aortic aneurysm without rupture. Pulmonary nodule.  EXAM: CT CHEST WITHOUT CONTRAST  TECHNIQUE: Multidetector CT imaging of the chest was performed following the standard protocol without IV contrast.  COMPARISON:  CT scan of June 30, 2018.  FINDINGS: Cardiovascular: 4.5 cm ascending thoracic aortic aneurysm is noted which is not significantly changed compared to prior exam based on my own measurement. Atherosclerosis of thoracic aorta is noted. Normal cardiac size. No pericardial effusion. Mild coronary artery  calcifications are noted. Transverse aortic arch measures 3.1 cm. Proximal descending thoracic aorta measures 2.7 cm.  Mediastinum/Nodes: Thyroid gland is not well visualized. Esophagus is unremarkable. Stable mildly enlarged mediastinal adenopathy is noted which most likely is reactive in etiology.  Lungs/Pleura: No pneumothorax or pleural effusion is noted. Stable right apical scarring is noted. Stable 4 mm nodule is noted in right upper lobe best seen on image number 67 of series 8. Stable 4 mm subpleural nodule is noted laterally in left lower lobe best seen on image number 107 of series 8. Both of these nodules can be considered benign at this point with no further follow-up required. Stable scarring is noted in left lower lobe.  Upper Abdomen: No acute abnormality.  Musculoskeletal: No chest wall mass or suspicious bone lesions identified.  IMPRESSION: Grossly stable 4.5 cm ascending thoracic aortic aneurysm. Recommend semi-annual imaging followup by CTA or MRA and referral to cardiothoracic surgery if not already obtained. This recommendation follows 2010 ACCF/AHA/AATS/ACR/ASA/SCA/SCAI/SIR/STS/SVM Guidelines for the Diagnosis and Management of Patients With Thoracic Aortic Disease. Circulation. 2010; [id number]. Aortic aneurysm NOS (ICD10-I71.9).  Mild coronary artery calcifications are noted.  Stable small bilateral pulmonary nodules are noted.  Stable mildly enlarged mediastinal adenopathy is noted which most likely is reactive in etiology.  Aortic Atherosclerosis (ICD10-I70.0).   Electronically Signed   By: Marijo Conception M.D.   On: 08/31/2019 12:45   Impression:  She has a stable 4.5 cm fusiform ascending aortic aneurysm that is well below the surgical threshold of 5.5 cm. I discussed the importance of good blood pressure control in preventing enlargement and acute dissection. The small bilateral pulmonary nodules are unchanged and felt to be  benign. They do not require continued follow up.  Plan:  She will continue to follow up with her PCP and pulmonologist. I will see her back in 2 years with a CTA of the chest to reassess her stable 4.5 cm ascending aortic aneurysm.  I spent 15 minutes performing this established patient evaluation and > 50% of this time was spent face to face counseling and coordinating the care of this patient's aortic aneurysm.    Gaye Pollack, MD [facility] [phone]

## 2019-09-12 ENCOUNTER — Encounter: Payer: Self-pay | Admitting: Internal Medicine

## 2019-09-12 ENCOUNTER — Other Ambulatory Visit: Payer: Self-pay

## 2019-09-12 ENCOUNTER — Ambulatory Visit (INDEPENDENT_AMBULATORY_CARE_PROVIDER_SITE_OTHER): Payer: Medicare Other | Admitting: Internal Medicine

## 2019-09-12 VITALS — BP 130/86 | HR 79 | Temp 98.3°F | Ht 63.75 in | Wt 243.0 lb

## 2019-09-12 DIAGNOSIS — Z0000 Encounter for general adult medical examination without abnormal findings: Secondary | ICD-10-CM

## 2019-09-12 DIAGNOSIS — Z23 Encounter for immunization: Secondary | ICD-10-CM

## 2019-09-12 DIAGNOSIS — Z85820 Personal history of malignant melanoma of skin: Secondary | ICD-10-CM | POA: Diagnosis not present

## 2019-09-12 DIAGNOSIS — I10 Essential (primary) hypertension: Secondary | ICD-10-CM | POA: Diagnosis not present

## 2019-09-12 DIAGNOSIS — J410 Simple chronic bronchitis: Secondary | ICD-10-CM | POA: Diagnosis not present

## 2019-09-12 DIAGNOSIS — R7303 Prediabetes: Secondary | ICD-10-CM

## 2019-09-12 DIAGNOSIS — F39 Unspecified mood [affective] disorder: Secondary | ICD-10-CM | POA: Diagnosis not present

## 2019-09-12 DIAGNOSIS — Z6841 Body Mass Index (BMI) 40.0 and over, adult: Secondary | ICD-10-CM

## 2019-09-12 DIAGNOSIS — G4733 Obstructive sleep apnea (adult) (pediatric): Secondary | ICD-10-CM

## 2019-09-12 DIAGNOSIS — Z7189 Other specified counseling: Secondary | ICD-10-CM | POA: Diagnosis not present

## 2019-09-12 MED ORDER — TRAMADOL HCL 50 MG PO TABS: 25.0000 mg | ORAL_TABLET | Freq: Three times a day (TID) | ORAL | 0 refills | Status: DC | PRN

## 2019-09-12 NOTE — Assessment & Plan Note (Signed)
Has lost considerable weight in the past 2 years Discussed continuing eating better and adding exercise

## 2019-09-12 NOTE — Assessment & Plan Note (Signed)
Follows with oncology and derm

## 2019-09-12 NOTE — Progress Notes (Signed)
Subjective:    Patient ID: Kari Medina, female    DOB: [birth year], 73 y.o.   MRN: [id number]  HPI Here for Medicare wellness visit and follow up of chronic health conditions Reviewed form and advanced directives Reviewed other doctors No alcohol or tobacco Not able to exercise---discussed chair or pool exercises [place] once this year--tripped. No injury Mood has been good now Vision is not good----macular dystrophy  Hearing is fine Independent with instrumental ADLs---with husband Has trouble on stairs though (uses rail)--just easy DOE Memory seems fine  Just saw Dr Earlene Plater Aneurysm is stable Gets some right nagging chest pain----nothing intense A pain patch will help ([place]) Still wheezes---and coughs regularly after using nebulizer Chronic sputum production  No signs of recurrent melanoma  Mood is still good on the duloxetine It continues to do a great job for her mood---mostly anxiety  Has lost another 25# since last year Hasn't changed her eating that much--but has stopped eating in between meals Only drinks water for the most part  Still not able to exercise Relates to her back---can't stand for extended times  Relates to spinal stenosis  No other chest pain Breathing is not great---but stable (stable DOE) No palpitations No dizziness or syncope No edema  Continues on the metformin Felt "weird" at first--now used to it  Current Outpatient Medications on File Prior to Visit  Medication Sig Dispense Refill  . acetaminophen (TYLENOL) 650 MG CR tablet Take 650 mg by mouth 3 (three) times daily.    Marland Kitchen aspirin EC 81 MG EC tablet Take 1 tablet (81 mg total) by mouth daily. 30 tablet 0  . b complex vitamins tablet Take 1 tablet by mouth daily.      . Bacillus Coagulans-Inulin (PROBIOTIC FORMULA PO) Take by mouth.    Marland Kitchen BREO ELLIPTA 100-25 MCG/INH AEPB Inhale 1 puff into the lungs at bedtime.  5  . calcium carbonate (OS-CAL) 600 MG TABS Take 600 mg by  mouth daily with breakfast.     . cholecalciferol (VITAMIN D) 1000 units tablet Take 2,000 Units by mouth 2 (two) times daily.    . DULoxetine (CYMBALTA) 60 MG capsule TAKE 1 CAPSULE(60 MG) BY MOUTH DAILY 90 capsule 3  . fluticasone (FLONASE) 50 MCG/ACT nasal spray Place 1 spray into both nostrils daily as needed. For stuffy nose  3  . glucose blood (ONETOUCH VERIO) test strip Use to check blood sugar once a day. E11.9 100 each 3  . hydrochlorothiazide (HYDRODIURIL) 12.5 MG tablet TAKE 1 TABLET(12.5 MG) BY MOUTH EVERY DAY 90 tablet 3  . Magnesium 500 MG TABS Take 500 mg by mouth 2 (two) times daily.    . metFORMIN (GLUCOPHAGE-XR) 500 MG 24 hr tablet TAKE 1 TABLET(500 MG) BY MOUTH DAILY WITH BREAKFAST 90 tablet 3  . montelukast (SINGULAIR) 10 MG tablet TAKE 1 TABLET(10 MG) BY MOUTH AT BEDTIME 90 tablet 3  . ONETOUCH DELICA LANCETS FINE MISC 1 each by Does not apply route daily. Use to check blood sugar once a day. E11.9 100 each 3  . OVER THE COUNTER MEDICATION EYE SUPPLEMENT...VISION ALIVE    . OVER THE COUNTER MEDICATION THYROID SUPPLEMENT    . OVER THE COUNTER MEDICATION BETTER BLADDER SUPPLEMENT    . potassium chloride (K-DUR) 10 MEQ tablet Take 1 tablet (10 mEq total) by mouth daily. 30 tablet 0  . propranolol (INDERAL) 40 MG tablet TAKE 1 TABLET BY MOUTH TWICE DAILY 180 tablet 3  . rOPINIRole (REQUIP) 2 MG  tablet TAKE 1 TABLET(2 MG) BY MOUTH EVERY NIGHT 90 tablet 3  . SYNTHROID 200 MCG tablet TAKE 1 TABLET(200 MCG) BY MOUTH DAILY BEFORE BREAKFAST 90 tablet 3  . traMADol (ULTRAM) 50 MG tablet Take 0.5-1 tablets (25-50 mg total) by mouth 3 (three) times daily as needed. 60 tablet 0  . Turmeric Curcumin 500 MG CAPS Take 500 mg by mouth at bedtime.     . VENTOLIN HFA 108 (90 Base) MCG/ACT inhaler Inhale 2 puffs into the lungs every 6 (six) hours as needed. For shortness of breath/wheezing 18 g 11  . vitamin C (ASCORBIC ACID) 500 MG tablet Take 500 mg by mouth daily.     No current  facility-administered medications on file prior to visit.     Allergies  Allergen Reactions  . Latex Hives  . Nickel Hives  . Pramipexole Other (See Comments)    Caused hallucinations, says he can take name brand.  . Prednisone     Makes her "feel crazy"  . Topiramate Other (See Comments)    "spaced out"  . Citalopram Anxiety  . Paroxetine Hcl Anxiety  . Sertraline Anxiety    Past Medical History:  Diagnosis Date  . Asthma   . Depression   . Diabetes mellitus without complication ([place])   . Dyspnea    doe  . Dysrhythmia    extra beat  . Fatty liver   . Fibromyalgia   . Generalized osteoarthritis of multiple sites   . History of hiatal hernia   . Hypertension   . Hypothyroidism   . Melanoma ([place]) 07/2018   Right leg  . OSA (obstructive sleep apnea)   . Pain    chronic ruq and back pain  . Panic attacks   . Raynaud disease   . RLS (restless legs syndrome)   . 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 PATIENT IS UNCERTAIN  . Tremor, essential     Past Surgical History:  Procedure Laterality Date  . CHOLECYSTECTOMY    . DILATATION & CURETTAGE/HYSTEROSCOPY WITH MYOSURE N/A 11/10/2018   Procedure: DILATATION & CURETTAGE/HYSTEROSCOPY WITH MYOSURE/MYOMECTOMY;  Surgeon: Aletha Halim, MD;  Location: [facility];  Service: Gynecology;  Laterality: N/A;  possible myosure.  Please use myosure scope, do not open myosure blades but have in the room  . JOINT REPLACEMENT Bilateral    2008/2011  . MELANOMA EXCISION  07/2018  . REVERSE SHOULDER ARTHROPLASTY Right 05/28/2017   Procedure: REVERSE SHOULDER ARTHROPLASTY;  Surgeon: Corky Mull, MD;  Location: [facility];  Service: Orthopedics;  Laterality: Right;  . RHINOPLASTY  1972  . THYROIDECTOMY  2006  . TOTAL KNEE ARTHROPLASTY     bilateral    Family History  Problem Relation Age of Onset  . Osteoarthritis Mother   . Diabetes Mother   . Cirrhosis Mother   . Cancer Father   . Kidney  cancer Father   . Bladder Cancer Father   . Heart disease Brother        stents in 1 brother  . Breast cancer Neg Hx     Social History   Socioeconomic History  . Marital status: Married    Spouse name: Not on file  . Number of children: 1  . Years of education: Not on file  . Highest education level: Not on file  Occupational History  . 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 Financial resource strain: Not on  file  . Food insecurity    Worry: Not on file    Inability: Not on file  . Transportation needs    Medical: Not on file    Non-medical: Not on file  Tobacco Use  . Smoking status: Former Smoker    Quit date: 12/22/1988    Years since quitting: 30.7  . Smokeless tobacco: Never Used  Substance and Sexual Activity  . Alcohol use: No  . Drug use: No  . Sexual activity: Not on file  Lifestyle  . Physical activity    Days per week: Not on file    Minutes per session: Not on file  . Stress: Not on file  Relationships  . Social Herbalist on phone: Not on file    Gets together: Not on file    Attends religious service: Not on file    Active member of club or organization: Not on file    Attends meetings of clubs or organizations: Not on file    Relationship status: Not on file  . Intimate partner violence    Fear of current or ex partner: Not on file    Emotionally abused: Not on file    Physically abused: Not on file    Forced sexual activity: Not on file  Other Topics Concern  . Not on file  Social History Narrative   1 daughter      Has living will   Husband has health care POA. Alternate would be daughter Judson Roch   Would allow resuscitation but no prolonged machines   Not sure about feeding tubes   Review of Systems Sleeps well in general Wears seat belt Full dentures---doesn't see dentist No heartburn usually or dysphagia (other than with tough meats) Bowels are fine--no blood No new skin lesions Has pain and  arthritic pain in fingers--aspercreme     Objective:   Physical Exam  Constitutional: She is oriented to person, place, and time. She appears well-developed.  HENT:  Mouth/Throat: Oropharynx is clear and moist. No oropharyngeal exudate.  Neck: No thyromegaly present.  Cardiovascular: Normal rate, regular rhythm, normal heart sounds and intact distal pulses. Exam reveals no gallop.  No murmur heard. Respiratory: Effort normal and breath sounds normal. No respiratory distress. She has no wheezes. She has no rales.  GI: Soft. There is no abdominal tenderness.  Musculoskeletal:        General: No tenderness or edema.  Lymphadenopathy:    She has no cervical adenopathy.  Neurological: She is alert and oriented to person, place, and time.  President--- "Daisy Floro, Obama, Bush" [phone] D-l-r-[id number]-w Recall 3/3  Skin: No rash noted. No erythema.  Psychiatric: She has a normal mood and affect. Her behavior is normal.           Assessment & Plan:

## 2019-09-12 NOTE — Assessment & Plan Note (Signed)
See social history 

## 2019-09-12 NOTE — Progress Notes (Signed)
Hearing Screening   Method: Audiometry   125Hz  250Hz  500Hz  1000Hz  2000Hz  3000Hz  4000Hz  6000Hz  8000Hz   Right ear:   0 0 40  40    Left ear:   0 0 40  40    Vision Screening Comments: December 2019

## 2019-09-12 NOTE — Assessment & Plan Note (Signed)
BP Readings from Last 3 Encounters:  09/12/19 130/86  09/07/19 (!) 160/90  01/11/19 (!) 149/75   Good control Due for labs

## 2019-09-12 NOTE — Assessment & Plan Note (Signed)
I have personally reviewed the Medicare Annual Wellness questionnaire and have noted 1. The patient's medical and social history 2. Their use of alcohol, tobacco or illicit drugs 3. Their current medications and supplements 4. The patient's functional ability including ADL's, fall risks, home safety risks and hearing or visual             impairment. 5. Diet and physical activities 6. Evidence for depression or mood disorders  The patients weight, height, BMI and visual acuity have been recorded in the chart I have made referrals, counseling and provided education to the patient based review of the above and I have provided the pt with a written personalized care plan for preventive services.  I have provided you with a copy of your personalized plan for preventive services. Please take the time to review along with your updated medication list.  Colon due in April 2021 Mammogram next year also Discussed exercise Flu vaccine Consider shingrix at pharmacy

## 2019-09-12 NOTE — Assessment & Plan Note (Signed)
Uses the CPAP intermittently ---less she has lost some weight Discussed nightly use

## 2019-09-12 NOTE — Assessment & Plan Note (Signed)
Along with restrictive lung disease Chronic DOE

## 2019-09-12 NOTE — Assessment & Plan Note (Signed)
Mostly anxiety in remission on the duloxetine

## 2019-09-12 NOTE — Assessment & Plan Note (Signed)
Is on the metformin

## 2019-09-12 NOTE — Addendum Note (Signed)
Addended by: Pilar Grammes on: 09/12/2019 05:19 PM   Modules accepted: Orders

## 2019-09-13 LAB — CBC
HCT: 48.8 % — ABNORMAL HIGH (ref 36.0–46.0)
Hemoglobin: 16.1 g/dL — ABNORMAL HIGH (ref 12.0–15.0)
MCHC: 33.1 g/dL (ref 30.0–36.0)
MCV: 93.8 fl (ref 78.0–100.0)
Platelets: 282 10*3/uL (ref 150.0–400.0)
RBC: 5.2 Mil/uL — ABNORMAL HIGH (ref 3.87–5.11)
RDW: 15.1 % (ref 11.5–15.5)
WBC: 10.6 10*3/uL — ABNORMAL HIGH (ref 4.0–10.5)

## 2019-09-13 LAB — COMPREHENSIVE METABOLIC PANEL
ALT: 19 U/L (ref 0–35)
AST: 23 U/L (ref 0–37)
Albumin: 3.9 g/dL (ref 3.5–5.2)
Alkaline Phosphatase: 71 U/L (ref 39–117)
BUN: 15 mg/dL (ref 6–23)
CO2: 29 mEq/L (ref 19–32)
Calcium: 10.3 mg/dL (ref 8.4–10.5)
Chloride: 103 mEq/L (ref 96–112)
Creatinine, Ser: 0.99 mg/dL (ref 0.40–1.20)
GFR: 55.27 mL/min — ABNORMAL LOW (ref >60.00)
Glucose, Bld: 84 mg/dL (ref 70–99)
Potassium: 4.5 mEq/L (ref 3.5–5.1)
Sodium: 141 mEq/L (ref 135–145)
Total Bilirubin: 0.5 mg/dL (ref 0.2–1.2)
Total Protein: 7.5 g/dL (ref 6.0–8.3)

## 2019-09-13 LAB — HEMOGLOBIN A1C: Hgb A1c MFr Bld: 6.6 % — ABNORMAL HIGH (ref 4.6–6.5)

## 2019-09-13 LAB — TSH: TSH: 0.98 u[IU]/mL (ref 0.35–4.50)

## 2019-09-13 LAB — T4, FREE: Free T4: 1.61 ng/dL — ABNORMAL HIGH (ref 0.60–1.60)

## 2019-09-26 DIAGNOSIS — Z6841 Body Mass Index (BMI) 40.0 and over, adult: Secondary | ICD-10-CM | POA: Diagnosis not present

## 2019-09-26 DIAGNOSIS — M545 Low back pain: Secondary | ICD-10-CM | POA: Diagnosis not present

## 2019-10-06 ENCOUNTER — Telehealth: Payer: Self-pay

## 2019-10-06 MED ORDER — ROPINIROLE HCL 3 MG PO TABS: 3.0000 mg | ORAL_TABLET | Freq: Every day | ORAL | 3 refills | Status: DC

## 2019-10-06 NOTE — Telephone Encounter (Signed)
Okay to increase to 3 mgtab once at bedtime.. send in rx for # 30 3 RF.

## 2019-10-06 NOTE — Telephone Encounter (Signed)
Spoke to pt. She will try the 3 mg and let us know if it does not work. Rx sent to [facility].

## 2019-10-06 NOTE — Telephone Encounter (Signed)
Pt left v/m; pt had annual exam on 09/12/19 and pt forgot to mention that requip 2 mg is not helping restless leg syndrome now and pt wants to know if requip could be increased. Pt request cb. Dr Silvio Pate does not return until 10/10/19.Please advise.

## 2019-10-20 DIAGNOSIS — M5136 Other intervertebral disc degeneration, lumbar region: Secondary | ICD-10-CM | POA: Diagnosis not present

## 2019-10-31 ENCOUNTER — Other Ambulatory Visit: Payer: Self-pay

## 2019-10-31 MED ORDER — SYNTHROID 200 MCG PO TABS: ORAL_TABLET | ORAL | 3 refills | Status: DC

## 2019-11-04 DIAGNOSIS — Z6841 Body Mass Index (BMI) 40.0 and over, adult: Secondary | ICD-10-CM | POA: Diagnosis not present

## 2019-11-04 DIAGNOSIS — M545 Low back pain: Secondary | ICD-10-CM | POA: Diagnosis not present

## 2019-11-16 ENCOUNTER — Other Ambulatory Visit: Payer: Self-pay

## 2019-11-17 IMAGING — US US TRANSVAGINAL NON-OB
1 series · 13 of 25 positions shown · non-contrast
Comparison: None available.

CLINICAL DATA: Initial evaluation for postmenopausal bleeding.



[Series 1: us transvaginal non-ob · 13 of 108 slices shown]
[im 1/108]
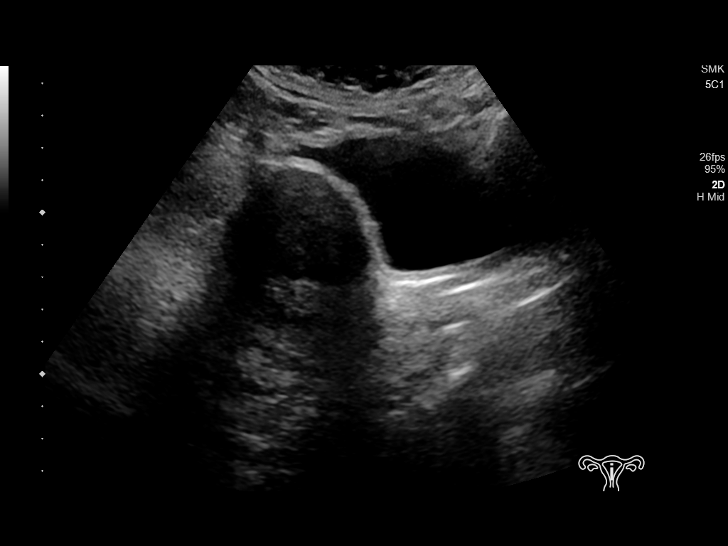
[im 9/108]
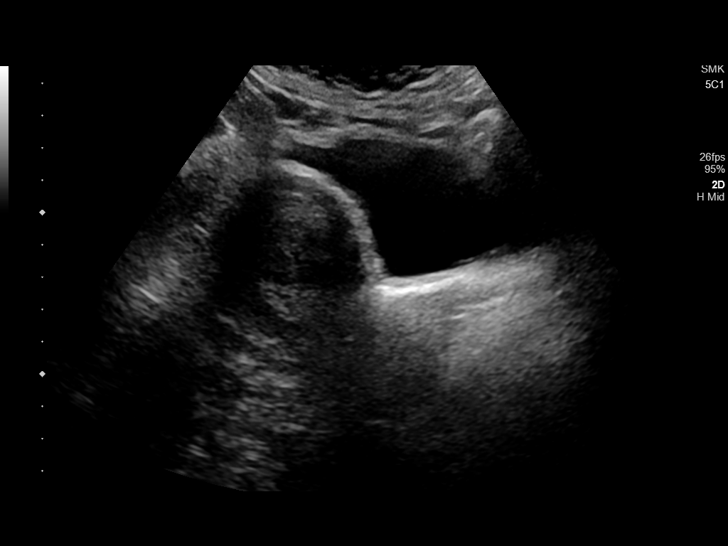
[im 18/108]
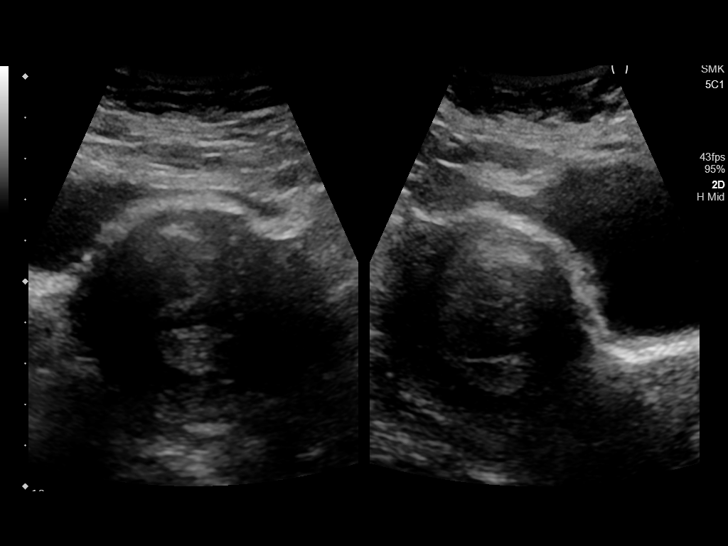
[im 27/108]
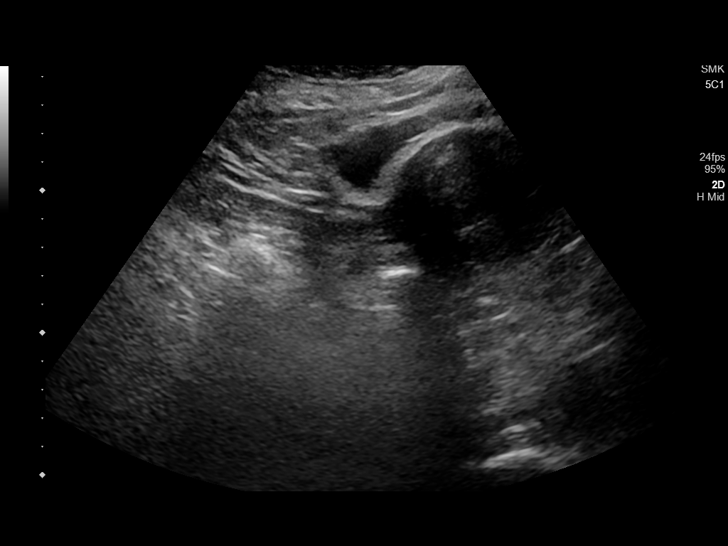
[im 36/108]
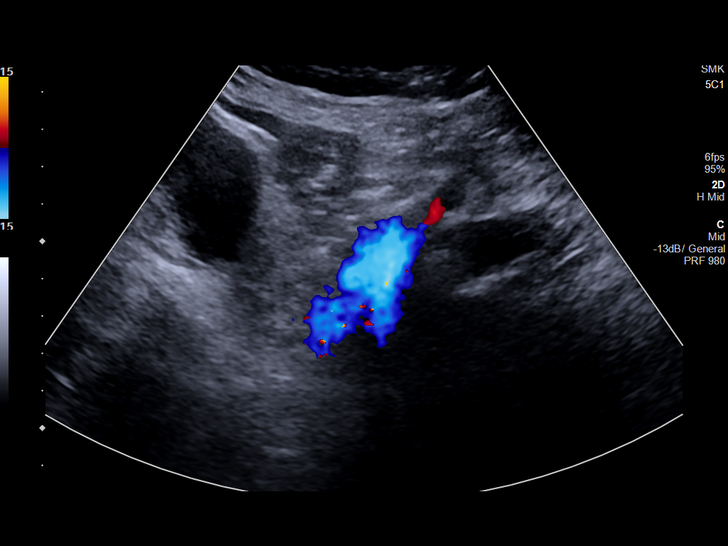
[im 45/108]
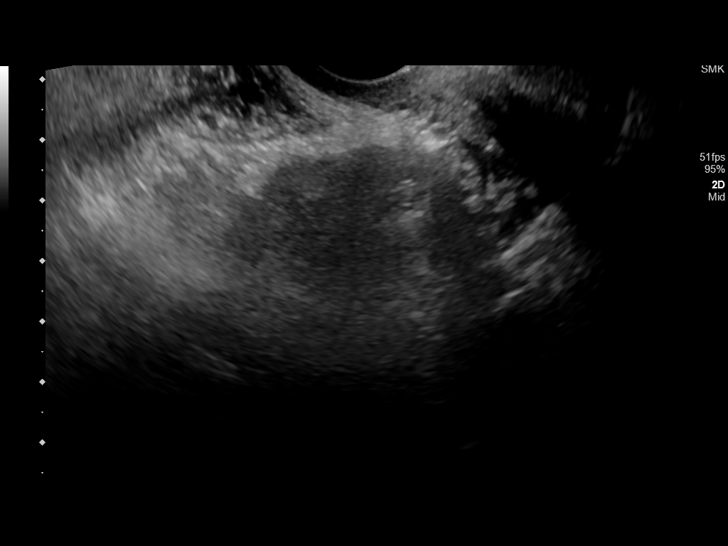
[im 54/108]
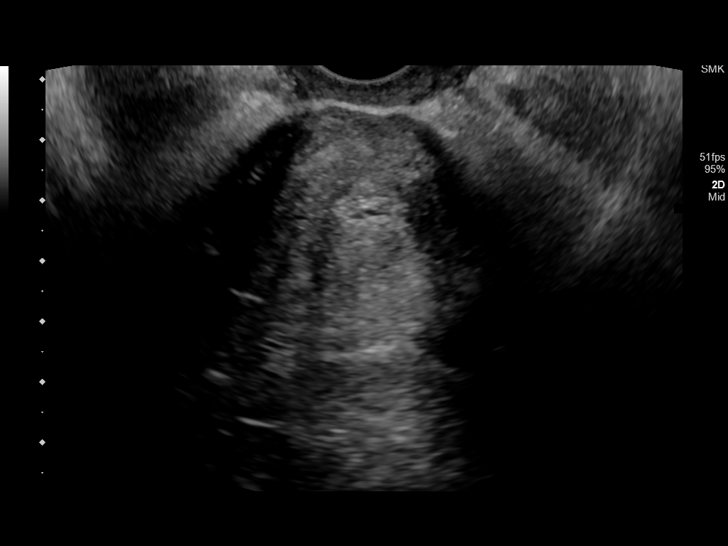
[im 63/108]
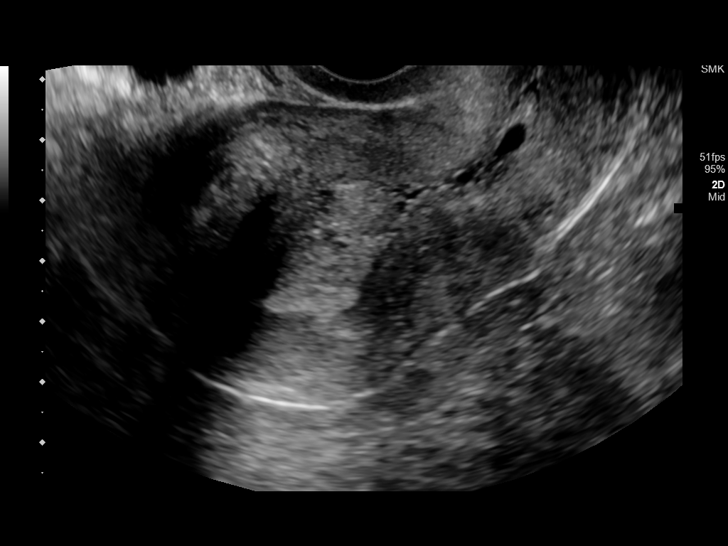
[im 72/108]
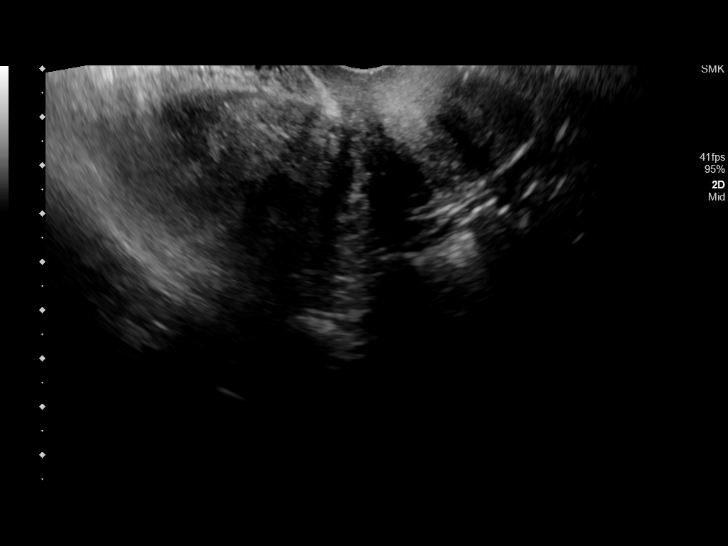
[im 81/108]
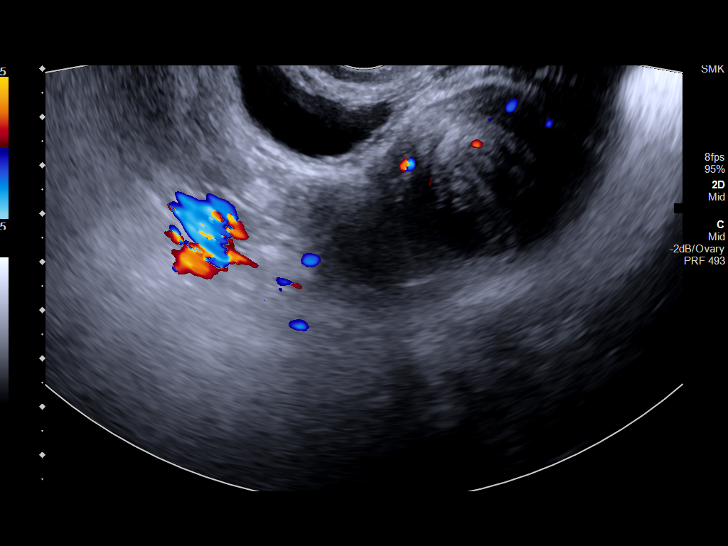
[im 90/108]
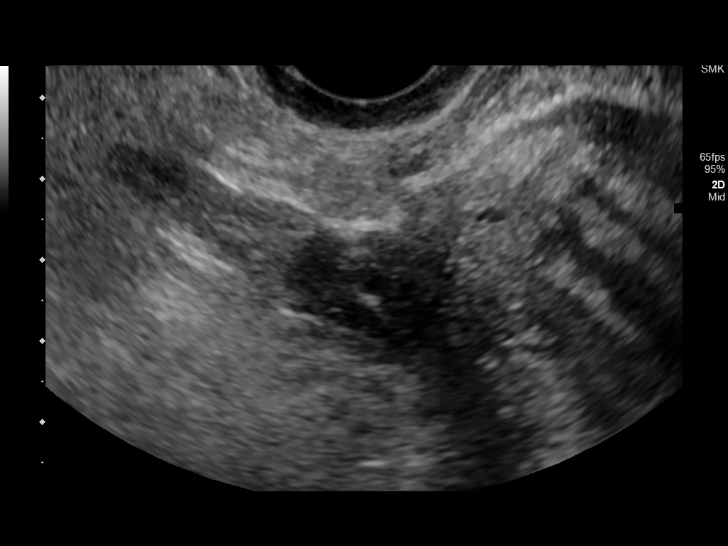
[im 99/108]
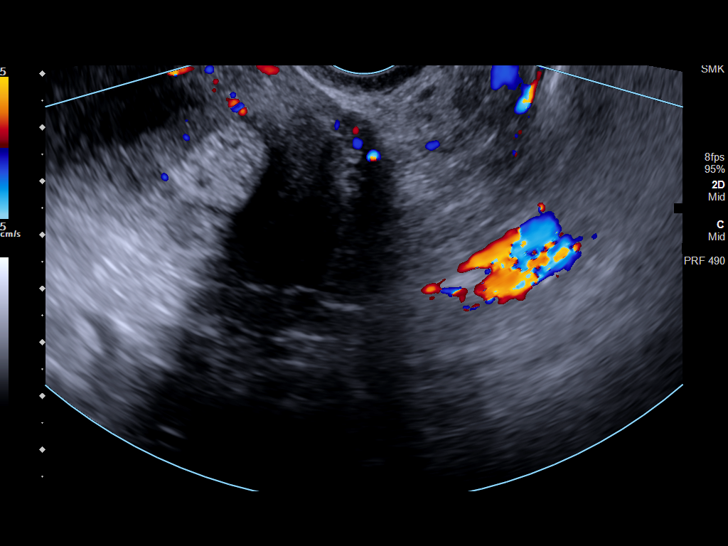
[im 108/108]
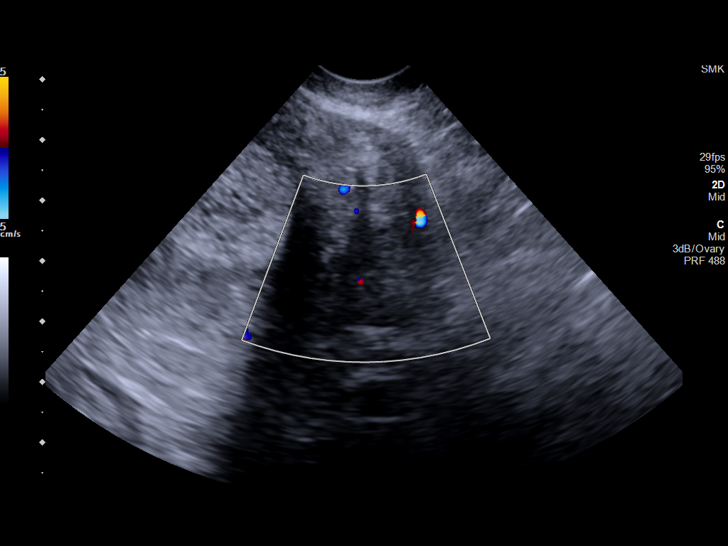

[13 of 25 positions shown; findings below may reference images not displayed]

FINDINGS: Uterus

Measurements: 8.7 x 5.0 x 5.5 cm. 2.8 x 3.1 x 3.1 cm probable
intramural fibroid present at the right uterine fundus.

Endometrium

Thickness: 15.2 mm. Uterus demonstrates a heterogeneous echotexture
with suggestion of scattered areas of increased vascularity.

Right ovary

Measurements: 1.9 x 1.6 x 1.1 cm. Normal appearance/no adnexal mass.

Left ovary

Measurements: 2.0 x 1.3 x 1.5 cm. Normal appearance/no adnexal mass.

Other findings

No definite free fluid within the pelvis.
IMPRESSION: 1. Endometrial stripe abnormally thickened up to 15 mm with
heterogeneous echotexture and increased vascularity. In the setting
of post-menopausal bleeding, endometrial sampling is indicated to
exclude carcinoma. If results are benign, sonohysterogram should be
considered for focal lesion work-up. (Ref: Radiological Reasoning:
Algorithmic Workup of Abnormal Vaginal Bleeding with Endovaginal
Sonography and Sonohysterography. AJR 0994; 191:S68-73).
2. 3.1 cm right fundal fibroid.
3. Normal sonographic appearance of the ovaries.  No adnexal mass.

## 2019-11-21 ENCOUNTER — Other Ambulatory Visit: Payer: Self-pay | Admitting: Internal Medicine

## 2019-12-28 ENCOUNTER — Telehealth: Payer: Self-pay | Admitting: Internal Medicine

## 2019-12-28 MED ORDER — ROPINIROLE HCL 3 MG PO TABS: 3.0000 mg | ORAL_TABLET | Freq: Every day | ORAL | 3 refills | Status: DC

## 2019-12-28 NOTE — Telephone Encounter (Signed)
Tried to call pt. Line was busy. I need clarification as to what she really needs filled.

## 2019-12-28 NOTE — Telephone Encounter (Signed)
Pt called her insurance changed and she needs to change pharmacy  She needs ropinirole sent to [facility] [address] She almost out of this  She needs the rest of her meds sent to [facility]

## 2019-12-28 NOTE — Telephone Encounter (Signed)
Patient is requesting a call back from the nurse. 

## 2019-12-28 NOTE — Telephone Encounter (Signed)
Spoke to pt.   Wants ropinirole sent to [id number] and use [facility].  Wants Synthroid changed to levothyroxine and sent to [facility]: HCTZ  Propranolol  Ventolin Duloxetine  Metformin  Montelukast

## 2019-12-28 NOTE — Telephone Encounter (Signed)
I sent the ropinirole to [facility]. Will get authorization from Dr Silvio Pate to change Synthroid to generic. Will send them all the others at one time.

## 2019-12-29 MED ORDER — LEVOTHYROXINE SODIUM 200 MCG PO TABS: ORAL_TABLET | ORAL | 3 refills | Status: DC

## 2019-12-29 MED ORDER — ALBUTEROL SULFATE HFA 108 (90 BASE) MCG/ACT IN AERS: 2.0000 | INHALATION_SPRAY | Freq: Four times a day (QID) | RESPIRATORY_TRACT | 11 refills | Status: DC | PRN

## 2019-12-29 MED ORDER — HYDROCHLOROTHIAZIDE 12.5 MG PO TABS: ORAL_TABLET | ORAL | 3 refills | Status: DC

## 2019-12-29 MED ORDER — PROPRANOLOL HCL 40 MG PO TABS: 40.0000 mg | ORAL_TABLET | Freq: Two times a day (BID) | ORAL | 3 refills | Status: DC

## 2019-12-29 MED ORDER — METFORMIN HCL ER 500 MG PO TB24: ORAL_TABLET | ORAL | 3 refills | Status: DC

## 2019-12-29 MED ORDER — DULOXETINE HCL 60 MG PO CPEP: ORAL_CAPSULE | ORAL | 3 refills | Status: DC

## 2019-12-29 MED ORDER — MONTELUKAST SODIUM 10 MG PO TABS: ORAL_TABLET | ORAL | 3 refills | Status: DC

## 2019-12-29 NOTE — Telephone Encounter (Signed)
Okay to change to generic. Hopefully they will be consistent with their manufacturer

## 2019-12-29 NOTE — Telephone Encounter (Signed)
Rxs sent to [facility] as requested.

## 2020-01-02 MED ORDER — ALBUTEROL SULFATE HFA 108 (90 BASE) MCG/ACT IN AERS: 2.0000 | INHALATION_SPRAY | Freq: Four times a day (QID) | RESPIRATORY_TRACT | 3 refills | Status: DC | PRN

## 2020-01-02 NOTE — Addendum Note (Signed)
Addended by: Pilar Grammes on: 01/02/2020 03:46 PM   Modules accepted: Orders

## 2020-01-02 NOTE — Telephone Encounter (Signed)
New insurance does not cover Ventolin but will cover Proventil. I am sending a new rx.

## 2020-01-05 ENCOUNTER — Telehealth: Payer: Self-pay

## 2020-01-05 NOTE — Telephone Encounter (Signed)
Ventolin is not covered by insurance but Proventil is. OK to switch? Needs to be sent to [facility]

## 2020-01-06 MED ORDER — ALBUTEROL SULFATE HFA 108 (90 BASE) MCG/ACT IN AERS: 2.0000 | INHALATION_SPRAY | Freq: Four times a day (QID) | RESPIRATORY_TRACT | 2 refills | Status: DC | PRN

## 2020-01-06 NOTE — Telephone Encounter (Signed)
Yes--same thing Go ahead and refill if needed

## 2020-01-06 NOTE — Telephone Encounter (Signed)
Proventil RX sent in

## 2020-01-09 DIAGNOSIS — R0902 Hypoxemia: Secondary | ICD-10-CM | POA: Diagnosis not present

## 2020-01-09 DIAGNOSIS — Z6841 Body Mass Index (BMI) 40.0 and over, adult: Secondary | ICD-10-CM | POA: Diagnosis not present

## 2020-01-09 DIAGNOSIS — R0600 Dyspnea, unspecified: Secondary | ICD-10-CM | POA: Diagnosis not present

## 2020-01-09 DIAGNOSIS — G4733 Obstructive sleep apnea (adult) (pediatric): Secondary | ICD-10-CM | POA: Diagnosis not present

## 2020-01-09 DIAGNOSIS — J449 Chronic obstructive pulmonary disease, unspecified: Secondary | ICD-10-CM | POA: Diagnosis not present

## 2020-03-08 ENCOUNTER — Telehealth: Payer: Self-pay | Admitting: Internal Medicine

## 2020-03-08 NOTE — Progress Notes (Signed)
  Chronic Care Management   Note  03/08/2020 Name: Teren Kandler MRN: [id number] DOB: 1946/09/13  Brandin Rosa is a 74 y.o. year old female who is a primary care patient of Venia Carbon, MD. I reached out to [name] by phone today in response to a referral sent by Ms. Cindy Hazy Leathers's PCP, Venia Carbon, MD.   Ms. Demoret was given information about Chronic Care Management services today including:  1. CCM service includes personalized support from designated clinical staff supervised by her physician, including individualized plan of care and coordination with other care providers 2. 24/7 contact phone numbers for assistance for urgent and routine care needs. 3. Service will only be billed when office clinical staff spend 20 minutes or more in a month to coordinate care. 4. Only one practitioner may furnish and bill the service in a calendar month. 5. The patient may stop CCM services at any time (effective at the end of the month) by phone call to the office staff.   Patient agreed to services and verbal consent obtained.   Follow up plan:   Raynicia Dukes [employer] Scheduler

## 2020-03-24 ENCOUNTER — Telehealth: Payer: Self-pay

## 2020-03-24 DIAGNOSIS — I10 Essential (primary) hypertension: Secondary | ICD-10-CM

## 2020-03-24 DIAGNOSIS — G2581 Restless legs syndrome: Secondary | ICD-10-CM

## 2020-03-24 NOTE — Telephone Encounter (Signed)
I would like to request a referral for Kari Medina to chronic care management pharmacy services for the following conditions:   Essential hypertension, benign  [I10]  RLS (restless legs syndrome) [G25.81]  Debbora Dus, PharmD Clinical Pharmacist [facility] [phone]

## 2020-03-26 NOTE — Telephone Encounter (Signed)
Referral created.

## 2020-03-29 ENCOUNTER — Ambulatory Visit: Payer: Medicare Other

## 2020-03-29 ENCOUNTER — Other Ambulatory Visit: Payer: Self-pay

## 2020-03-29 DIAGNOSIS — G250 Essential tremor: Secondary | ICD-10-CM

## 2020-03-29 DIAGNOSIS — R7303 Prediabetes: Secondary | ICD-10-CM

## 2020-03-29 DIAGNOSIS — F39 Unspecified mood [affective] disorder: Secondary | ICD-10-CM

## 2020-03-29 DIAGNOSIS — G2581 Restless legs syndrome: Secondary | ICD-10-CM

## 2020-03-29 DIAGNOSIS — R32 Unspecified urinary incontinence: Secondary | ICD-10-CM

## 2020-03-29 DIAGNOSIS — M159 Polyosteoarthritis, unspecified: Secondary | ICD-10-CM

## 2020-03-29 DIAGNOSIS — J410 Simple chronic bronchitis: Secondary | ICD-10-CM

## 2020-03-29 DIAGNOSIS — I10 Essential (primary) hypertension: Secondary | ICD-10-CM

## 2020-03-29 DIAGNOSIS — G4733 Obstructive sleep apnea (adult) (pediatric): Secondary | ICD-10-CM

## 2020-03-29 NOTE — Chronic Care Management (AMB) (Signed)
Chronic Care Management Pharmacy  Name: Kari Medina  MRN: [id number] DOB: Mar 13, 1946  Chief Complaint/ HPI  Kari Medina,  74 y.o., female presents for their Initial CCM visit with the clinical pharmacist via telephone.  PCP : Venia Carbon, MD   Their chronic conditions include: hypertension, sleep apnea, mood disorder, chronic bronchitis, essential tremor, osteoarthrosis, restless leg syndrome, pre-diabetes  Patient concerns: chronic fatigue - thinks its thyroid related, would like to see endocrinologist to assess chronic fatigue, recently switched from Synthroid to levothyroxine due to cost --> will refer to PCP for appt to discuss these concerns, recommend checking TSH due to switch to generic Synthroid 12/2019  Office Visits:  09/12/19: Kari Medina - continue current meds, health maintenance - flu vaccine, exercise, mammogram next year, shingrix  Consult Visit:  01/09/20: Pulmonology - change breo to symbicort 160/4.5 two puffs q bid, follow up 2 weeks   11/04/19: Lower back pain - no note available  09/07/19: Cardiology - Thoracic aortic aneurysm without rupture, maintain BP control - rtc 2 years   Allergies  Allergen Reactions  . Latex Hives  . Nickel Hives  . Pramipexole Other (See Comments)    Caused hallucinations, says he can take name brand.  . Prednisone     Makes her "feel crazy"  . Topiramate Other (See Comments)    "spaced out"  . Citalopram Anxiety  . Paroxetine Hcl Anxiety  . Sertraline Anxiety   Medications: Outpatient Encounter Medications as of 03/29/2020  Medication Sig  . acetaminophen (TYLENOL) 650 MG CR tablet Take 650 mg by mouth 3 (three) times daily.  Marland Kitchen albuterol (PROVENTIL HFA) 108 (90 Base) MCG/ACT inhaler Inhale 2 puffs into the lungs every 6 (six) hours as needed for wheezing or shortness of breath.  Marland Kitchen aspirin EC 81 MG EC tablet Take 1 tablet (81 mg total) by mouth daily.  Marland Kitchen b complex vitamins tablet Take 1 tablet by mouth  daily.    . Bacillus Coagulans-Inulin (PROBIOTIC FORMULA PO) Take by mouth.  Marland Kitchen BREO ELLIPTA 100-25 MCG/INH AEPB Inhale 1 puff into the lungs at bedtime.  . calcium carbonate (OS-CAL) 600 MG TABS Take 600 mg by mouth daily with breakfast.   . cholecalciferol (VITAMIN D) 1000 units tablet Take 2,000 Units by mouth 2 (two) times daily.  . DULoxetine (CYMBALTA) 60 MG capsule TAKE 1 CAPSULE(60 MG) BY MOUTH DAILY  . fluticasone (FLONASE) 50 MCG/ACT nasal spray Place 1 spray into both nostrils daily as needed. For stuffy nose  . glucose blood (ONETOUCH VERIO) test strip Use to check blood sugar once a day. E11.9  . hydrochlorothiazide (HYDRODIURIL) 12.5 MG tablet TAKE 1 TABLET(12.5 MG) BY MOUTH EVERY DAY  . levothyroxine (SYNTHROID) 200 MCG tablet TAKE 1 TABLET(200 MCG) BY MOUTH DAILY BEFORE BREAKFAST  . Magnesium 500 MG TABS Take 500 mg by mouth 2 (two) times daily.  . metFORMIN (GLUCOPHAGE-XR) 500 MG 24 hr tablet TAKE 1 TABLET(500 MG) BY MOUTH DAILY WITH BREAKFAST  . montelukast (SINGULAIR) 10 MG tablet TAKE 1 TABLET(10 MG) BY MOUTH AT BEDTIME  . ONETOUCH DELICA LANCETS FINE MISC 1 each by Does not apply route daily. Use to check blood sugar once a day. E11.9  . OVER THE COUNTER MEDICATION EYE SUPPLEMENT...VISION ALIVE  . OVER THE COUNTER MEDICATION THYROID SUPPLEMENT  . OVER THE COUNTER MEDICATION BETTER BLADDER SUPPLEMENT  . potassium chloride (K-DUR) 10 MEQ tablet Take 1 tablet (10 mEq total) by mouth daily.  . propranolol (INDERAL) 40 MG tablet Take  1 tablet (40 mg total) by mouth 2 (two) times daily.  Marland Kitchen rOPINIRole (REQUIP) 3 MG tablet Take 1 tablet (3 mg total) by mouth at bedtime.  . traMADol (ULTRAM) 50 MG tablet Take 0.5-1 tablets (25-50 mg total) by mouth 3 (three) times daily as needed.  . Turmeric Curcumin 500 MG CAPS Take 500 mg by mouth at bedtime.   . vitamin C (ASCORBIC ACID) 500 MG tablet Take 500 mg by mouth daily.   No facility-administered encounter medications on file as of  03/29/2020.   Current Diagnosis/Assessment: Goals    . Pharmacy Care Plan     CARE PLAN ENTRY  Current Barriers:  . Chronic Disease Management support, education, and care coordination needs related to hypertension, sleep apnea, mood disorder, chronic bronchitis, essential tremor, osteoarthrosis, restless leg syndrome, pre-diabetes  Pharmacist Clinical Goal(s):  Marland Kitchen Improve chronic fatigue. Refer to Dr. Silvio Medina for evaluation. . Evaluate adverse drug reaction concern with propranolol. Refer to Dr. Silvio Medina to discuss alternative for essential tremor.  . Evaluate thyroid hormone levels. Recommend updating TSH due to switch from Synthroid to levothyroxine and change of timing of administration. . Improve cholesterol with goal of LDL less than 70. Recommend updating lipid panel and considering heart healthy diet such as DASH or the Mediterranean Diet. Recommend slowly increasing exercise. Continue to work on weight loss goals. Long term healthy lifestyle choices are more effective than supplements, however, if you would like to try One Shot Keto, recommend monitoring your weight loss for effectiveness and watching diet and exercise for long term maintenance. . Maintain blood pressure within goal of less than 140/90 mmHg. Recommend checking blood pressure at home twice weekly to ensure within goal. If elevated please call.  . Improve nausea with metformin. Recommend taking 1 tablet with dinner rather than breakfast.  . Remain up to date on vaccinations. Recommend tetanus (Tdap) and shingles vaccines (Shingrix) from local pharmacy.  . Supplements reviewed for drug interactions. Recommend waiting at least 4 hours after levothyroxine to take multivitamins, magnesium and calcium. Watch for increased risk of bleeding with tumeric. Aspirin 81 mg is preferred over 325 mg daily unless directed otherwise by your physician. I would not recommend an additional liver supplement at this time. Healthy diet and exercise are  the best medicine for fatty liver.  Interventions: . Comprehensive medication review performed . Drug interactions reviewed  Patient Self Care Activities:  . Self administers medications as prescribed  Initial goal documentation       Hyperlipidemia   Lipid Panel     Component Value Date/Time   CHOL 182 09/06/2018 1605   TRIG 142.0 09/06/2018 1605   HDL 38.00 (L) 09/06/2018 1605   CHOLHDL 5 09/06/2018 1605   VLDL 28.4 09/06/2018 1605   LDLCALC 116 (H) 09/06/2018 1605    The 10-year ASCVD risk score Mikey Bussing DC Jr., et al., 2013) is: 29.6%   Values used to calculate the score:     Age: 60 years     Sex: Female     Is Non-Hispanic African American: No     Diabetic: Yes     Tobacco smoker: No     Systolic Blood Pressure: 0000000 mmHg     Is BP treated: Yes     HDL Cholesterol: 38 mg/dL     Total Cholesterol: 182 mg/dL   LDL goal < 70 (aortic atherosclerosis) Patient has failed these meds in past: none Patient is currently uncontrolled on the following medications:   No pharmacotherapy  We  discussed: patient reports she needs to work on weight loss, current weight is 243 lbs, wants to lose 70 lbs.; energy and back pain limit exercise, interested in increasing exercise with home bicycle; she is open to cholesterol medication if needed, but prefers not to start anything new (would like to know if One Shot Keto is a good supplement for weight loss)  Plan: Recommend updating lipid panel.   Hypertension   CMP Latest Ref Rng & Units 09/12/2019 11/08/2018 11/01/2018  Glucose 70 - 99 mg/dL 84 85 92  BUN 6 - 23 mg/dL 15 12 14   Creatinine 0.40 - 1.20 mg/dL 0.99 1.04(H) 0.88  Sodium 135 - 145 mEq/L 141 137 137  Potassium 3.5 - 5.1 mEq/L 4.5 4.4 3.9  Chloride 96 - 112 mEq/L 103 105 102  CO2 19 - 32 mEq/L 29 23 26   Calcium 8.4 - 10.5 mg/dL 10.3 9.6 10.7(H)  Total Protein 6.0 - 8.3 g/dL 7.5 7.7 -  Total Bilirubin 0.2 - 1.2 mg/dL 0.5 0.6 -  Alkaline Phos 39 - 117 U/L 71 82 -  AST 0  - 37 U/L 23 28 -  ALT 0 - 35 U/L 19 23 -   Office blood pressures are: BP Readings from Last 3 Encounters:  09/12/19 130/86  09/07/19 (!) 160/90  01/11/19 (!) 149/75   BP < 140/90 mmHg Sleep apnea: CPAP nightly with oxygen  Patient has failed these meds in the past: amlodipine (swelling) Patient checks BP at home: infrequently, but has home monitor  Patient home BP readings are ranging: none reported  Patient is currently controlled on the following medications:   Hydrochlorothiazide 12.5 mg - 1 tablet daily   Potassium chloride 10 mEq - 1 tablet daily (not taking)  We discussed: reports BP is usually elevated in office, stopped taking potassium on her own prior to September 2020,  potassium was WNL after she discontinued therapy, recommend continuing to monitor BP several days this week due to some elevations in clinic, continue to monitor BMP/potassium   Plan: Continue current medications; Recommend monitoring BP twice a week.   Chronic Bronchitis/Rhinitis   Tobacco Status:  Social History   Tobacco Use  Smoking Status Former Smoker  . Quit date: 12/22/1988  . Years since quitting: 31.2  Smokeless Tobacco Never Used   Patient has failed these meds in past: Breo - not covered by insurance Patient is currently controlled on the following medications:   Albuterol HFA 90 mcg - 2 puffs q6h PRN   Symbicort 160/4.5 - 2 puffs BID   Supplement oxygen - 24/7 (started recently)  Air Physio (lung expansion device, mucous clearing device) - noticed it has helped with phlegm    Allergies:  Montelukast 10 mg - 1 tablet daily at bedtime  Flonase - 1 spray in each nostril daily (PRN)   Using maintenance inhaler regularly? Yes Frequency of rescue inhaler use:  infrequently (not using nebulizer/inhaler as much since starting oxygen)  We discussed: started oxygen and switched to Symbicort recently, noticed some improvement in fatigue with oxygen/shortness of breath; staying inside  to avoid allergens, has air purifier in bedroom   Plan: Continue current medications  Pre-Diabetes   Recent Relevant Labs: Lab Results  Component Value Date/Time   HGBA1C 6.6 (H) 09/12/2019 04:57 PM   HGBA1C 6.6 (H) 10/23/2018 02:25 PM    Checking BG: none, but has a glucometer   Patient has failed these meds in past: none Patient is currently controlled on the following medications:  Metformin 500 mg - 1 tablet daily with breakfast   Last diabetic eye exam:  Lab Results  Component Value Date/Time   HMDIABEYEEXA No Retinopathy 12/21/2018 12:00 AM    Last diabetic foot exam: denies any cuts, scrapes, etc.    We discussed: reports nausea with metformin   Plan: Continue current medications; Recommend trying metformin at dinner time rather than breakfast to reduce nausea.  Hypothyroidism   Per Pulmonology clinic: s/p thyroid resection,hashimoto/nodules  TSH  Date Value Ref Range Status  09/12/2019 0.98 0.35 - 4.50 uIU/mL Final    Patient has failed these meds in past: recently switched from Synthroid 12/2019 Patient is currently controlled on the following medications:   Levothyroxine 200 mcg - 1 tablet daily before 1 hour breakfast  We discussed: TSH has not been updated since switching to generic levothyroxine, pt also recently started taking before medications rather than with morning medications  Plan: Continue current medications; Recommend updating TSH and f/u with PCP to discuss fatigue concerns.  Restless Leg Syndrome    Patient has failed these meds in past: gabapentin  Patient is currently controlled on the following medications:   Ropinirole 3 mg - 1 tablet at bedtime  We discussed: working well  Plan: Continue current medications  Mood Disorder/Pain    Patient has failed these meds in past: none Patient is currently controlled on the following medications:   Duloxetine 60 mg - 1 capsule daily  We discussed: reports taking duloxetine for  pain/anxiety, denies adverse effects, helps a lot with pain   Plan: Continue current medications  Osteoarthrosis    Reports history of both knees and shoulder replaced  Patient has failed these meds in past: none  Patient is currently controlled on the following medications:   Tylenol 650 mg - TID (takes infrequently)   Tumeric and curcumin 500 mg - 1 qhs  Tramadol 50 mg - 1/2 to 1 tablet TID PRN  We discussed: takes tramadol less than once a week, as needed; reports tumeric has been very helpful   Plan: Continue current medications  Essential Tremor   Patient has failed these meds in past: gabapentin (didn't tolerate) Patient is currently uncontrolled on the following medications:   Propranolol 40 mg - 1 tablet BID  We discussed: reports taking propranolol > 5 years, helps with tremors but not as much as it used to and thinks it causes urge incontinence symptoms; interested in tapering off but would like to start alternative first --> Refer to PCP   Plan: Continue current medications; Discuss tapering off propranolol with PCP at next appt.   Vaccines   Reviewed and discussed patient's vaccination history.    Immunization History  Administered Date(s) Administered  . Fluad Quad(high Dose 65+) 09/12/2019  . Influenza,inj,Quad PF,6+ Mos 09/06/2018  . Influenza-Unspecified 10/27/2017  . Pneumococcal Conjugate-13 04/16/2016  . Pneumococcal Polysaccharide-23 08/20/2017   Plan: Recommended patient receive Tdap and Shingrix.   Supplement Review    Patient would like drug interaction review  Patient has failed these meds in past: none  Patient is currently on the following medications:   Vision Alive Max - 1 daily   Vitamin B-complex - 1 daily  Aspirin 325 mg - 1/2 tablet daily   Probiotic -1 capsule daily   Vitamin D 2000 IU - BID  Azo Bladder Control - 1 daily   Vitamin C 500 mg - 1 daily   Calcium 600 mg - 1 tablet every other day   Zinc - 1 tablet  daily  (immune health)  Magnesium 500 mg - 1 tablet BID (cramps)  Tumeric 500 mg - 1 qhs   [employer] Liver supplement - not taking, but interested  We discussed: Levothyroxine should be separated from magnesium and calcium by at least 4 hours. Tumeric may increase your risk of bleeding, especially with aspirin. Aspirin 81 mg is preferred over 325 mg daily unless directed otherwise by your physician. Healthy diet and exercise are the best medicine for fatty liver.  Plan: Continue current medications; Separate levothyroxine from multivitamins, magnesium and calcium by 4 hours.   Medication Management  OTCs: Pharmacy/Benefits: [employer] - Medicare Part D/[facility] (acute) [facility] mail order   Adherence: denies missed doses   Affordability: no concerns   CCM Follow Up:  06/29/20 at 11:00 AM (telephone)  Debbora Dus, PharmD Clinical Pharmacist [facility] [phone]

## 2020-03-30 NOTE — Patient Instructions (Addendum)
March 29, 2020  Dear [name],  It was a pleasure meeting you during our initial appointment on March 29, 2020. Below is a summary of the goals we discussed and components of chronic care management. Please contact me anytime with questions or concerns.   Visit Information  Goals Addressed            This Visit's Progress   . Pharmacy Care Plan       CARE PLAN ENTRY  Current Barriers:  . Chronic Disease Management support, education, and care coordination needs related to hypertension, sleep apnea, mood disorder, chronic bronchitis, essential tremor, osteoarthrosis, restless leg syndrome, pre-diabetes  Pharmacist Clinical Goal(s):  Marland Kitchen Improve chronic fatigue. Refer to Dr. Silvio Pate for evaluation. . Evaluate adverse drug reaction concern with propranolol. Refer to Dr. Silvio Pate to discuss alternative for essential tremor.  . Evaluate thyroid hormone levels. Recommend updating TSH due to switch from Synthroid to levothyroxine and change of timing of administration. . Improve cholesterol with goal of LDL less than 70. Recommend updating lipid panel and considering heart healthy diet such as DASH or the Mediterranean Diet. Recommend slowly increasing exercise. Continue to work on weight loss goals. Long term healthy lifestyle choices are more effective than supplements, however, if you would like to try One Shot Keto, recommend monitoring your weight loss for effectiveness and watching diet and exercise for long term maintenance. . Maintain blood pressure within goal of less than 140/90 mmHg. Recommend checking blood pressure at home twice weekly to ensure within goal. If elevated please call.  . Improve nausea with metformin. Recommend taking 1 tablet with dinner rather than breakfast.  . Remain up to date on vaccinations. Recommend tetanus (Tdap) and shingles vaccines (Shingrix) from local pharmacy.  . Supplements reviewed for drug interactions. Recommend waiting at least 4 hours after  levothyroxine to take multivitamins, magnesium and calcium. Watch for increased risk of bleeding with tumeric. Aspirin 81 mg is preferred over 325 mg daily unless directed otherwise by your physician. I would not recommend an additional liver supplement at this time. Healthy diet and exercise are the best medicine for fatty liver.  Interventions: . Comprehensive medication review performed . Drug interactions reviewed  Patient Self Care Activities:  . Self administers medications as prescribed  Initial goal documentation       Ms. Underhill was given information about Chronic Care Management services today including:  1. CCM service includes personalized support from designated clinical staff supervised by her physician, including individualized plan of care and coordination with other care providers 2. 24/7 contact phone numbers for assistance for urgent and routine care needs. 3. Standard insurance, coinsurance, copays and deductibles apply for chronic care management only during months in which we provide at least 20 minutes of these services. Most insurances cover these services at 100%, however patients may be responsible for any copay, coinsurance and/or deductible if applicable. This service may help you avoid the need for more expensive face-to-face services. 4. Only one practitioner may furnish and bill the service in a calendar month. 5. The patient may stop CCM services at any time (effective at the end of the month) by phone call to the office staff.  Patient agreed to services and verbal consent obtained.   The patient verbalized understanding of instructions provided today and agreed to receive a mailed copy of patient instruction and/or educational materials. Telephone follow up appointment with pharmacy team member scheduled for: 06/29/20 at 11:00 AM (telephone) - please call me at  the number below if this time doesn't work for you.  Debbora Dus, PharmD Clinical  Pharmacist [facility] [phone]   [place] stands for "Dietary Approaches to Stop Hypertension." The DASH eating plan is a healthy eating plan that has been shown to reduce high blood pressure (hypertension). It may also reduce your risk for type 2 diabetes, heart disease, and stroke. The DASH eating plan may also help with weight loss. What are tips for following this plan?  General guidelines  Avoid eating more than 2,300 mg (milligrams) of salt (sodium) a day. If you have hypertension, you may need to reduce your sodium intake to 1,500 mg a day.  Limit alcohol intake to no more than 1 drink a day for nonpregnant women and 2 drinks a day for men. One drink equals 12 oz of beer, 5 oz of wine, or 1 oz of hard liquor.  Work with your health care provider to maintain a healthy body weight or to lose weight. Ask what an ideal weight is for you.  Get at least 30 minutes of exercise that causes your heart to beat faster (aerobic exercise) most days of the week. Activities may include walking, swimming, or biking.  Work with your health care provider or diet and nutrition specialist (dietitian) to adjust your eating plan to your individual calorie needs. Reading food labels   Check food labels for the amount of sodium per serving. Choose foods with less than 5 percent of the Daily Value of sodium. Generally, foods with less than 300 mg of sodium per serving fit into this eating plan.  To find whole grains, look for the word "whole" as the first word in the ingredient list. Shopping  Buy products labeled as "low-sodium" or "no salt added."  Buy fresh foods. Avoid canned foods and premade or frozen meals. Cooking  Avoid adding salt when cooking. Use salt-free seasonings or herbs instead of table salt or sea salt. Check with your health care provider or pharmacist before using salt substitutes.  Do not fry foods. Cook foods using healthy methods  such as baking, boiling, grilling, and broiling instead.  Cook with heart-healthy oils, such as olive, canola, soybean, or sunflower oil. Meal planning  Eat a balanced diet that includes: ? 5 or more servings of fruits and vegetables each day. At each meal, try to fill half of your plate with fruits and vegetables. ? Up to 6-8 servings of whole grains each day. ? Less than 6 oz of lean meat, poultry, or fish each day. A 3-oz serving of meat is about the same size as a deck of cards. One egg equals 1 oz. ? 2 servings of low-fat dairy each day. ? A serving of nuts, seeds, or beans 5 times each week. ? Heart-healthy fats. Healthy fats called Omega-3 fatty acids are found in foods such as flaxseeds and coldwater fish, like sardines, salmon, and mackerel.  Limit how much you eat of the following: ? Canned or prepackaged foods. ? Food that is high in trans fat, such as fried foods. ? Food that is high in saturated fat, such as fatty meat. ? Sweets, desserts, sugary drinks, and other foods with added sugar. ? Full-fat dairy products.  Do not salt foods before eating.  Try to eat at least 2 vegetarian meals each week.  Eat more home-cooked food and less restaurant, buffet, and fast food.  When eating at a restaurant, ask that your food be prepared  with less salt or no salt, if possible. What foods are recommended? The items listed may not be a complete list. Talk with your dietitian about what dietary choices are best for you. Grains Whole-grain or whole-wheat bread. Whole-grain or whole-wheat pasta. Brown rice. [place]. Bulgur. Whole-grain and low-sodium cereals. Pita bread. Low-fat, low-sodium crackers. Whole-wheat flour tortillas. Vegetables Fresh or frozen vegetables (raw, steamed, roasted, or grilled). Low-sodium or reduced-sodium tomato and vegetable juice. Low-sodium or reduced-sodium tomato sauce and tomato paste. Low-sodium or reduced-sodium canned vegetables. Fruits All  fresh, dried, or frozen fruit. Canned fruit in natural juice (without added sugar). Meat and other protein foods Skinless chicken or [place]. Ground chicken or [place]. Pork with fat trimmed off. Fish and seafood. Egg whites. Dried beans, peas, or lentils. Unsalted nuts, nut butters, and seeds. Unsalted canned beans. Lean cuts of beef with fat trimmed off. Low-sodium, lean deli meat. Dairy Low-fat (1%) or fat-free (skim) milk. Fat-free, low-fat, or reduced-fat cheeses. Nonfat, low-sodium ricotta or cottage cheese. Low-fat or nonfat yogurt. Low-fat, low-sodium cheese. Fats and oils Soft margarine without trans fats. Vegetable oil. Low-fat, reduced-fat, or light mayonnaise and salad dressings (reduced-sodium). Canola, safflower, olive, soybean, and sunflower oils. Avocado. Seasoning and other foods Herbs. Spices. Seasoning mixes without salt. Unsalted popcorn and pretzels. Fat-free sweets. What foods are not recommended? The items listed may not be a complete list. Talk with your dietitian about what dietary choices are best for you. Grains Baked goods made with fat, such as croissants, muffins, or some breads. Dry pasta or rice meal packs. Vegetables Creamed or fried vegetables. Vegetables in a cheese sauce. Regular canned vegetables (not low-sodium or reduced-sodium). Regular canned tomato sauce and paste (not low-sodium or reduced-sodium). Regular tomato and vegetable juice (not low-sodium or reduced-sodium). Angie Fava. Olives. Fruits Canned fruit in a light or heavy syrup. Fried fruit. Fruit in cream or butter sauce. Meat and other protein foods Fatty cuts of meat. Ribs. Fried meat. Berniece Salines. Sausage. Bologna and other processed lunch meats. Salami. Fatback. Hotdogs. Bratwurst. Salted nuts and seeds. Canned beans with added salt. Canned or smoked fish. Whole eggs or egg yolks. Chicken or [place] with skin. Dairy Whole or 2% milk, cream, and half-and-half. Whole or full-fat cream cheese. Whole-fat or  sweetened yogurt. Full-fat cheese. Nondairy creamers. Whipped toppings. Processed cheese and cheese spreads. Fats and oils Butter. Stick margarine. Lard. Shortening. Ghee. Bacon fat. Tropical oils, such as coconut, palm kernel, or palm oil. Seasoning and other foods Salted popcorn and pretzels. Onion salt, garlic salt, seasoned salt, table salt, and sea salt. Worcestershire sauce. Tartar sauce. Barbecue sauce. Teriyaki sauce. Soy sauce, including reduced-sodium. Steak sauce. Canned and packaged gravies. Fish sauce. Oyster sauce. Cocktail sauce. Horseradish that you find on the shelf. Ketchup. Mustard. Meat flavorings and tenderizers. Bouillon cubes. Hot sauce and Tabasco sauce. Premade or packaged marinades. Premade or packaged taco seasonings. Relishes. Regular salad dressings. Where to find more information:  National Heart, Lung, and [place]: https://wilson-eaton.com/  American Heart Association: www.heart.org Summary  The DASH eating plan is a healthy eating plan that has been shown to reduce high blood pressure (hypertension). It may also reduce your risk for type 2 diabetes, heart disease, and stroke.  With the DASH eating plan, you should limit salt (sodium) intake to 2,300 mg a day. If you have hypertension, you may need to reduce your sodium intake to 1,500 mg a day.  When on the DASH eating plan, aim to eat more fresh fruits and vegetables, whole grains, lean  proteins, low-fat dairy, and heart-healthy fats.  Work with your health care provider or diet and nutrition specialist (dietitian) to adjust your eating plan to your individual calorie needs. This information is not intended to replace advice given to you by your health care provider. Make sure you discuss any questions you have with your health care provider. Document Revised: 11/20/2017 Document Reviewed: 12/01/2016 Elsevier Patient Education  2020 [employer].

## 2020-04-11 DIAGNOSIS — L820 Inflamed seborrheic keratosis: Secondary | ICD-10-CM | POA: Diagnosis not present

## 2020-04-11 DIAGNOSIS — D485 Neoplasm of uncertain behavior of skin: Secondary | ICD-10-CM | POA: Diagnosis not present

## 2020-04-11 DIAGNOSIS — Z85820 Personal history of malignant melanoma of skin: Secondary | ICD-10-CM | POA: Diagnosis not present

## 2020-04-11 DIAGNOSIS — L538 Other specified erythematous conditions: Secondary | ICD-10-CM | POA: Diagnosis not present

## 2020-04-11 DIAGNOSIS — D225 Melanocytic nevi of trunk: Secondary | ICD-10-CM | POA: Diagnosis not present

## 2020-04-11 DIAGNOSIS — L814 Other melanin hyperpigmentation: Secondary | ICD-10-CM | POA: Diagnosis not present

## 2020-04-11 DIAGNOSIS — R208 Other disturbances of skin sensation: Secondary | ICD-10-CM | POA: Diagnosis not present

## 2020-04-11 DIAGNOSIS — D2261 Melanocytic nevi of right upper limb, including shoulder: Secondary | ICD-10-CM | POA: Diagnosis not present

## 2020-04-11 DIAGNOSIS — D2272 Melanocytic nevi of left lower limb, including hip: Secondary | ICD-10-CM | POA: Diagnosis not present

## 2020-06-29 ENCOUNTER — Other Ambulatory Visit: Payer: Self-pay

## 2020-06-29 ENCOUNTER — Telehealth: Payer: Self-pay

## 2020-06-29 ENCOUNTER — Ambulatory Visit: Payer: Medicare Other

## 2020-06-29 DIAGNOSIS — I10 Essential (primary) hypertension: Secondary | ICD-10-CM

## 2020-06-29 DIAGNOSIS — R7303 Prediabetes: Secondary | ICD-10-CM

## 2020-06-29 NOTE — Chronic Care Management (AMB) (Signed)
Chronic Care Management Pharmacy  Name: Kari Medina  MRN: [id number] DOB: July 08, 1946  Chief Complaint/ HPI  Kari Medina,  74 y.o., female presents for their Follow-Up CCM visit with the clinical pharmacist via telephone.  PCP : Venia Carbon, MD   Their chronic conditions include: hypertension, sleep apnea, mood disorder, chronic bronchitis, essential tremor, osteoarthrosis, restless leg syndrome, pre-diabetes  Office Visits: None since last CCM visit 03/29/20  09/12/19: Silvio Pate - continue current meds, health maintenance - flu vaccine, exercise, mammogram next year, shingrix. rtc 1 year  Consult Visit:  01/09/20: Pulmonology - change breo to symbicort 160/4.5 two puffs q bid, follow up 2 weeks   11/04/19: Lower back pain - no note available  09/07/19: Cardiology - Thoracic aortic aneurysm without rupture, maintain BP control - rtc 2 years   Allergies  Allergen Reactions  . Latex Hives  . Nickel Hives  . Pramipexole Other (See Comments)    Caused hallucinations, says he can take name brand.  . Prednisone     Makes her "feel crazy"  . Topiramate Other (See Comments)    "spaced out"  . Citalopram Anxiety  . Paroxetine Hcl Anxiety  . Sertraline Anxiety   Medications: Outpatient Encounter Medications as of 06/29/2020  Medication Sig  . acetaminophen (TYLENOL) 650 MG CR tablet Take 650 mg by mouth 3 (three) times daily.  Marland Kitchen albuterol (PROVENTIL HFA) 108 (90 Base) MCG/ACT inhaler Inhale 2 puffs into the lungs every 6 (six) hours as needed for wheezing or shortness of breath.  Marland Kitchen aspirin EC 81 MG EC tablet Take 1 tablet (81 mg total) by mouth daily.  Marland Kitchen b complex vitamins tablet Take 1 tablet by mouth daily.    . Bacillus Coagulans-Inulin (PROBIOTIC FORMULA PO) Take by mouth.  Marland Kitchen BREO ELLIPTA 100-25 MCG/INH AEPB Inhale 1 puff into the lungs at bedtime.  . calcium carbonate (OS-CAL) 600 MG TABS Take 600 mg by mouth daily with breakfast.   . cholecalciferol  (VITAMIN D) 1000 units tablet Take 2,000 Units by mouth 2 (two) times daily.  . DULoxetine (CYMBALTA) 60 MG capsule TAKE 1 CAPSULE(60 MG) BY MOUTH DAILY  . fluticasone (FLONASE) 50 MCG/ACT nasal spray Place 1 spray into both nostrils daily as needed. For stuffy nose  . glucose blood (ONETOUCH VERIO) test strip Use to check blood sugar once a day. E11.9  . hydrochlorothiazide (HYDRODIURIL) 12.5 MG tablet TAKE 1 TABLET(12.5 MG) BY MOUTH EVERY DAY  . levothyroxine (SYNTHROID) 200 MCG tablet TAKE 1 TABLET(200 MCG) BY MOUTH DAILY BEFORE BREAKFAST  . Magnesium 500 MG TABS Take 500 mg by mouth 2 (two) times daily.  . metFORMIN (GLUCOPHAGE-XR) 500 MG 24 hr tablet TAKE 1 TABLET(500 MG) BY MOUTH DAILY WITH BREAKFAST  . montelukast (SINGULAIR) 10 MG tablet TAKE 1 TABLET(10 MG) BY MOUTH AT BEDTIME  . ONETOUCH DELICA LANCETS FINE MISC 1 each by Does not apply route daily. Use to check blood sugar once a day. E11.9  . OVER THE COUNTER MEDICATION EYE SUPPLEMENT...VISION ALIVE  . OVER THE COUNTER MEDICATION THYROID SUPPLEMENT  . OVER THE COUNTER MEDICATION BETTER BLADDER SUPPLEMENT  . potassium chloride (K-DUR) 10 MEQ tablet Take 1 tablet (10 mEq total) by mouth daily. (Patient not taking: Reported on 03/30/2020)  . propranolol (INDERAL) 40 MG tablet Take 1 tablet (40 mg total) by mouth 2 (two) times daily.  Marland Kitchen rOPINIRole (REQUIP) 3 MG tablet Take 1 tablet (3 mg total) by mouth at bedtime.  . traMADol (ULTRAM) 50 MG  tablet Take 0.5-1 tablets (25-50 mg total) by mouth 3 (three) times daily as needed.  . Turmeric Curcumin 500 MG CAPS Take 500 mg by mouth at bedtime.   . vitamin C (ASCORBIC ACID) 500 MG tablet Take 500 mg by mouth daily.   No facility-administered encounter medications on file as of 06/29/2020.   Current Diagnosis/Assessment: Goals    . Pharmacy Care Plan     CARE PLAN ENTRY  Current Barriers:  . Chronic Disease Management support, education, and care coordination needs related to Hypertension,  Hyperlipidemia, and Diabetes   Hypertension BP Readings from Last 3 Encounters:  09/12/19 130/86  09/07/19 (!) 160/90  01/11/19 (!) 149/75 .  Pharmacist Clinical Goal(s): o Over the next 3 months, patient will work with PharmD and providers to achieve BP goal <140/90 mmHg . Current regimen:  o Hydrochlorothiazide 12.5 mg - 1 tablet daily  . Interventions: o Recommend home blood pressure monitoring due to elevated BP in clinic . Patient self care activities - Over the next 3 months, patient will: o Check blood pressure for 7 days prior to office visit with Dr. Silvio Pate or pharmacist, document, and provide at future appointments o Ensure daily salt intake < 2300 mg/day  Hyperlipidemia Lab Results  Component Value Date/Time   LDLCALC 116 (H) 09/06/2018 04:05 PM .  Pharmacist Clinical Goal(s): o Over the next 3 months, patient will work with PharmD and providers to achieve LDL goal < 70 . Current regimen:  o No pharmacotherapy . Interventions: o Recommend considering cholesterol medication after updating lipid panel . Patient self care activities - Over the next 3 months, patient will: o Consider starting a cholesterol medication o Slowly increase exercise with goal of 30 minutes, 5 days per week o Incorporate a healthy diet high in vegetables, fruits and whole grains with low-fat dairy products, chicken, fish, legumes, non-tropical vegetable oils and nuts. Limit intake of sweets, sugar-sweetened beverages and red meats. o Make progress towards personal goal of weight loss: 70 pounds (most recent weight reported 243 lbs)  Diabetes Lab Results  Component Value Date/Time   HGBA1C 6.6 (H) 09/12/2019 04:57 PM   HGBA1C 6.6 (H) 10/23/2018 02:25 PM .  Pharmacist Clinical Goal(s): o Over the next 3 months, patient will work with PharmD and providers to maintain A1c goal <7% . Current regimen:  o Metformin 500 mg - 1 tablet daily with food . Interventions: o Nausea resolved since taking  metformin with food  . Patient self care activities - Over the next 3 months, patient will: o Continue current medications  Additional recommendations: . Please schedule appt with Dr. Silvio Pate to discuss chronic fatigue and propranolol concerns. . Recommend tetanus (Tdap) and shingles vaccines (Shingrix) from local pharmacy.   Please see past updates related to this goal by clicking on the "Past Updates" button in the selected goal        Hyperlipidemia   Lipid Panel     Component Value Date/Time   CHOL 182 09/06/2018 1605   TRIG 142.0 09/06/2018 1605   HDL 38.00 (L) 09/06/2018 1605   CHOLHDL 5 09/06/2018 1605   VLDL 28.4 09/06/2018 1605   LDLCALC 116 (H) 09/06/2018 1605    The 10-year ASCVD risk score Mikey Bussing DC Jr., et al., 2013) is: 29.6%   Values used to calculate the score:     Age: 56 years     Sex: Female     Is Non-Hispanic African American: No     Diabetic: Yes  Tobacco smoker: No     Systolic Blood Pressure: 308 mmHg     Is BP treated: Yes     HDL Cholesterol: 38 mg/dL     Total Cholesterol: 182 mg/dL   LDL goal < 70 (aortic atherosclerosis) Patient has failed these meds in past: none Patient is currently uncontrolled on the following medications:   No pharmacotherapy  No changes, still pending updated lipid panel with next OV We discussed at initial CCM: patient reports she needs to work on weight loss, current weight is 243 lbs, wants to lose 70 lbs.; energy and back pain limit exercise, interested in increasing exercise with home bicycle; she is open to cholesterol medication if needed, but prefers not to start anything new.  Plan: Recommend updating lipid panel.   Hypertension   CMP Latest Ref Rng & Units 09/12/2019 11/08/2018 11/01/2018  Glucose 70 - 99 mg/dL 84 85 92  BUN 6 - 23 mg/dL 15 12 14   Creatinine 0.40 - 1.20 mg/dL 0.99 1.04(H) 0.88  Sodium 135 - 145 mEq/L 141 137 137  Potassium 3.5 - 5.1 mEq/L 4.5 4.4 3.9  Chloride 96 - 112 mEq/L 103 105  102  CO2 19 - 32 mEq/L 29 23 26   Calcium 8.4 - 10.5 mg/dL 10.3 9.6 10.7(H)  Total Protein 6.0 - 8.3 g/dL 7.5 7.7 -  Total Bilirubin 0.2 - 1.2 mg/dL 0.5 0.6 -  Alkaline Phos 39 - 117 U/L 71 82 -  AST 0 - 37 U/L 23 28 -  ALT 0 - 35 U/L 19 23 -   Office blood pressures are: BP Readings from Last 3 Encounters:  09/12/19 130/86  09/07/19 (!) 160/90  01/11/19 (!) 149/75   BP < 140/90 mmHg Sleep apnea: CPAP nightly with oxygen  Patient has failed these meds in the past: amlodipine (swelling) Patient checks BP at home: infrequently, but has home monitor  Patient home BP readings are ranging: none reported  Patient is currently controlled on the following medications:   Hydrochlorothiazide 12.5 mg - 1 tablet daily   We discussed: pt has not checked home BP, recommend checking prior to next office visit  Plan: Continue current medications; Recommend checking BP daily for 7 days prior to PCP visit.    Pre-Diabetes   Recent Relevant Labs: Lab Results  Component Value Date/Time   HGBA1C 6.6 (H) 09/12/2019 04:57 PM   HGBA1C 6.6 (H) 10/23/2018 02:25 PM    Checking BG: none, but has a glucometer   Patient has failed these meds in past: none Patient is currently controlled on the following medications:   Metformin 500 mg - 1 tablet daily with breakfast   Due for annual eye and foot exam  We discussed: reports taking metformin with food now (still at breakfast) and nausea has resolved   Plan: Continue current medications  Hypothyroidism   Per Pulmonology clinic: s/p thyroid resection,hashimoto/nodules  TSH  Date Value Ref Range Status  09/12/2019 0.98 0.35 - 4.50 uIU/mL Final    Patient has failed these meds in past: recently switched from Synthroid 12/2019 Patient is currently controlled on the following medications:   Levothyroxine 200 mcg - 1 tablet daily before 1 hour breakfast  No changes since last CCM --> today recommended again pt schedule PCP visit  We discussed  at initial CCM visit: Pt reports chronic fatigue and would like to see endocrinologist. TSH has not been updated since switching to generic levothyroxine 01/21, pt also started taking levothyroxine before medications rather than with  morning medications.   Plan: Continue current medications; Recommend updating TSH and schedule f/u with PCP to discuss fatigue concerns.  Essential Tremor   Patient has failed these meds in past: gabapentin (didn't tolerate) Patient is currently uncontrolled on the following medications:   Propranolol 40 mg - 1 tablet BID  Pt reports recently starting CBD oil for tremor and has noticed improvement, she is still interested in coming off propranolol  We discussed at initial CCM visit: reports taking propranolol > 5 years, helps with tremors but not as much as it used to and thinks it causes urge incontinence symptoms; interested in tapering off but would like to start alternative first --> Refer to PCP   Plan: Continue current medications; Recommend scheduling appt with PCP.   Vaccines   Reviewed and discussed patient's vaccination history.    Immunization History  Administered Date(s) Administered  . Fluad Quad(high Dose 65+) 09/12/2019  . Influenza,inj,Quad PF,6+ Mos 09/06/2018  . Influenza-Unspecified 10/27/2017  . Pneumococcal Conjugate-13 04/16/2016  . Pneumococcal Polysaccharide-23 08/20/2017   Plan: Recommended patient receive Tdap and Shingrix.   Supplement Review    Patient would like drug interaction review  Patient has failed these meds in past: none  Patient is currently on the following medications:   Vision Alive Max - 1 daily   Vitamin B-complex - 1 daily  Aspirin 325 mg - 1/2 tablet daily   Probiotic -1 capsule daily   Vitamin D 2000 IU - BID  Azo Bladder Control - 1 daily   Vitamin C 500 mg - 1 daily   Calcium 600 mg - 1 tablet every other day   Zinc - 1 tablet daily (immune health)  Magnesium 500 mg - 1 tablet BID  (cramps)  Tumeric 500 mg - 1 qhs   [employer] Liver supplement - not taking, but interested  We discussed at initial CCM visit: Levothyroxine should be separated from magnesium and calcium by at least 4 hours. Tumeric may increase your risk of bleeding, especially with aspirin. Aspirin 81 mg is preferred over 325 mg daily unless directed otherwise by your physician. Healthy diet and exercise are recommended over supplements for fatty liver.  Plan: Continue current medications; Separate levothyroxine from multivitamins, magnesium and calcium by 4 hours.   Medication Management  OTCs: Pharmacy/Benefits: [employer] - Medicare Part D/[facility] (acute) [facility] mail order   Adherence: denies missed doses   Affordability: no concerns   CCM Follow Up:  4 months, telephone   Debbora Dus, PharmD Clinical Pharmacist [facility] [phone]

## 2020-06-29 NOTE — Telephone Encounter (Signed)
Please set her up to review these concerns We can start with the thyroid tests and discuss weaning the medication

## 2020-06-29 NOTE — Patient Instructions (Addendum)
Dear Kari Medina,  Below is a summary of the goals we discussed during our follow up appointment on June 29, 2020. Please contact me anytime with questions or concerns.   Visit Information  Goals Addressed            This Visit's Progress   . Pharmacy Care Plan       CARE PLAN ENTRY  Current Barriers:  . Chronic Disease Management support, education, and care coordination needs related to Hypertension, Hyperlipidemia, and Diabetes   Hypertension BP Readings from Last 3 Encounters:  09/12/19 130/86  09/07/19 (!) 160/90  01/11/19 (!) 149/75 .  Pharmacist Clinical Goal(s): o Over the next 3 months, patient will work with PharmD and providers to achieve BP goal <140/90 mmHg . Current regimen:  o Hydrochlorothiazide 12.5 mg - 1 tablet daily  . Interventions: o Recommend home blood pressure monitoring due to elevated BP in clinic . Patient self care activities - Over the next 3 months, patient will: o Check blood pressure for 7 days prior to office visit with Dr. Silvio Pate or pharmacist, document, and provide at future appointments o Ensure daily salt intake < 2300 mg/day  Hyperlipidemia Lab Results  Component Value Date/Time   LDLCALC 116 (H) 09/06/2018 04:05 PM .  Pharmacist Clinical Goal(s): o Over the next 3 months, patient will work with PharmD and providers to achieve LDL goal < 70 . Current regimen:  o No pharmacotherapy . Interventions: o Recommend considering cholesterol medication after updating lipid panel . Patient self care activities - Over the next 3 months, patient will: o Consider starting a cholesterol medication o Slowly increase exercise with goal of 30 minutes, 5 days per week o Incorporate a healthy diet high in vegetables, fruits and whole grains with low-fat dairy products, chicken, fish, legumes, non-tropical vegetable oils and nuts. Limit intake of sweets, sugar-sweetened beverages and red meats. o Make progress towards personal goal of weight  loss: 70 pounds (most recent weight reported 243 lbs)  Diabetes Lab Results  Component Value Date/Time   HGBA1C 6.6 (H) 09/12/2019 04:57 PM   HGBA1C 6.6 (H) 10/23/2018 02:25 PM .  Pharmacist Clinical Goal(s): o Over the next 3 months, patient will work with PharmD and providers to maintain A1c goal <7% . Current regimen:  o Metformin 500 mg - 1 tablet daily with food . Interventions: o Nausea resolved since taking metformin with food  . Patient self care activities - Over the next 3 months, patient will: o Continue current medications  Additional recommendations: . Please schedule appt with Dr. Silvio Pate to discuss chronic fatigue and propranolol concerns. . Recommend tetanus (Tdap) and shingles vaccines (Shingrix) from local pharmacy.   Please see past updates related to this goal by clicking on the "Past Updates" button in the selected goal       The patient verbalized understanding of instructions provided today and agreed to receive a mailed copy of patient instruction and/or educational materials.  Telephone follow up appointment with pharmacy team member scheduled for: 11/27/20 at 9 AM (telephone visit)  Debbora Dus, PharmD Clinical Pharmacist [facility]  [place] refers to food and lifestyle choices that are based on the traditions of countries located on the [place]. This way of eating has been shown to help prevent certain conditions and improve outcomes for people who have chronic diseases, like kidney disease and heart disease. What are tips for following this plan? Lifestyle  Cook and  eat meals together with your family, when possible.  Drink enough fluid to keep your urine clear or pale yellow.  Be physically active every day. This includes: ? Aerobic exercise like running or swimming. ? Leisure activities like gardening, walking, or housework.  Get 7-8 hours of sleep each  night.  If recommended by your health care provider, drink red wine in moderation. This means 1 glass a day for nonpregnant women and 2 glasses a day for men. A glass of wine equals 5 oz (150 mL). Reading food labels   Check the serving size of packaged foods. For foods such as rice and pasta, the serving size refers to the amount of cooked product, not dry.  Check the total fat in packaged foods. Avoid foods that have saturated fat or trans fats.  Check the ingredients list for added sugars, such as corn syrup. Shopping  At the grocery store, buy most of your food from the areas near the walls of the store. This includes: ? Fresh fruits and vegetables (produce). ? Grains, beans, nuts, and seeds. Some of these may be available in unpackaged forms or large amounts (in bulk). ? Fresh seafood. ? Poultry and eggs. ? Low-fat dairy products.  Buy whole ingredients instead of prepackaged foods.  Buy fresh fruits and vegetables in-season from local farmers markets.  Buy frozen fruits and vegetables in resealable bags.  If you do not have access to quality fresh seafood, buy precooked frozen shrimp or canned fish, such as tuna, salmon, or sardines.  Buy small amounts of raw or cooked vegetables, salads, or olives from the deli or salad bar at your store.  Stock your pantry so you always have certain foods on hand, such as olive oil, canned tuna, canned tomatoes, rice, pasta, and beans. Cooking  Cook foods with extra-virgin olive oil instead of using butter or other vegetable oils.  Have meat as a side dish, and have vegetables or grains as your main dish. This means having meat in small portions or adding small amounts of meat to foods like pasta or stew.  Use beans or vegetables instead of meat in common dishes like chili or lasagna.  Experiment with different cooking methods. Try roasting or broiling vegetables instead of steaming or sauteing them.  Add frozen vegetables to soups,  stews, pasta, or rice.  Add nuts or seeds for added healthy fat at each meal. You can add these to yogurt, salads, or vegetable dishes.  Marinate fish or vegetables using olive oil, lemon juice, garlic, and fresh herbs. Meal planning   Plan to eat 1 vegetarian meal one day each week. Try to work up to 2 vegetarian meals, if possible.  Eat seafood 2 or more times a week.  Have healthy snacks readily available, such as: ? Vegetable sticks with hummus. ? [place] yogurt. ? Fruit and nut trail mix.  Eat balanced meals throughout the week. This includes: ? Fruit: 2-3 servings a day ? Vegetables: 4-5 servings a day ? Low-fat dairy: 2 servings a day ? Fish, poultry, or lean meat: 1 serving a day ? Beans and legumes: 2 or more servings a week ? Nuts and seeds: 1-2 servings a day ? Whole grains: 6-8 servings a day ? Extra-virgin olive oil: 3-4 servings a day  Limit red meat and sweets to only a few servings a month What are my food choices?  Mediterranean diet ? Recommended  Grains: Whole-grain pasta. Brown rice. Bulgar wheat. Polenta. Couscous. Whole-wheat bread. [place].  Vegetables: Artichokes. Beets. Broccoli. Cabbage. Carrots. Eggplant. Green beans. Chard. Kale. Spinach. Onions. Leeks. Peas. Squash. Tomatoes. Peppers. Radishes.  Fruits: Apples. Apricots. Avocado. Berries. Bananas. Cherries. Dates. Figs. Grapes. Lemons. Melon. Oranges. Peaches. Plums. Pomegranate.  Meats and other protein foods: Beans. Almonds. Sunflower seeds. Pine nuts. Peanuts. [place]. Salmon. Scallops. Shrimp. [place]. Tilapia. Clams. Oysters. Eggs.  Dairy: Low-fat milk. Cheese. Greek yogurt.  Beverages: Water. Red wine. Herbal tea.  Fats and oils: Extra virgin olive oil. Avocado oil. Grape seed oil.  Sweets and desserts: [place] yogurt with honey. Baked apples. Poached pears. Trail mix.  Seasoning and other foods: Basil. Cilantro. Coriander. Cumin. Mint. Parsley. Sage. Rosemary. Tarragon. Garlic.  Oregano. Thyme. Pepper. Balsalmic vinegar. Tahini. Hummus. Tomato sauce. Olives. Mushrooms. ? Limit these  Grains: Prepackaged pasta or rice dishes. Prepackaged cereal with added sugar.  Vegetables: Deep fried potatoes (french fries).  Fruits: Fruit canned in syrup.  Meats and other protein foods: Beef. Pork. Lamb. Poultry with skin. Hot dogs. [place] Salines.  Dairy: Ice cream. Sour cream. Whole milk.  Beverages: Juice. Sugar-sweetened soft drinks. Beer. Liquor and spirits.  Fats and oils: Butter. Canola oil. Vegetable oil. Beef fat (tallow). Lard.  Sweets and desserts: Cookies. Cakes. Pies. Candy.  Seasoning and other foods: Mayonnaise. Premade sauces and marinades. The items listed may not be a complete list. Talk with your dietitian about what dietary choices are right for you. Summary  The Mediterranean diet includes both food and lifestyle choices.  Eat a variety of fresh fruits and vegetables, beans, nuts, seeds, and whole grains.  Limit the amount of red meat and sweets that you eat.  Talk with your health care provider about whether it is safe for you to drink red wine in moderation. This means 1 glass a day for nonpregnant women and 2 glasses a day for men. A glass of wine equals 5 oz (150 mL). This information is not intended to replace advice given to you by your health care provider. Make sure you discuss any questions you have with your health care provider. Document Revised: 08/07/2016 Document Reviewed: 07/31/2016 Elsevier Patient Education  [place].

## 2020-06-29 NOTE — Telephone Encounter (Signed)
Recommend patient schedule PCP appt due to following concerns:   Reports desire to taper off propranolol for tremor due to concern that the medication is causing urge incontinence.  Reports concern about chronic fatigue. Thinks its thyroid related and would like to see endocrinologist. She switched from Synthroid to generic levothyroxine January 2021 and has not had TSH updated.   Debbora Dus, PharmD Clinical Pharmacist [facility] [phone]

## 2020-07-05 ENCOUNTER — Ambulatory Visit: Payer: Medicare Other | Admitting: Internal Medicine

## 2020-07-06 ENCOUNTER — Other Ambulatory Visit: Payer: Self-pay

## 2020-07-06 ENCOUNTER — Encounter: Payer: Self-pay | Admitting: Internal Medicine

## 2020-07-06 ENCOUNTER — Ambulatory Visit (INDEPENDENT_AMBULATORY_CARE_PROVIDER_SITE_OTHER): Payer: Medicare Other | Admitting: Internal Medicine

## 2020-07-06 VITALS — BP 126/80 | HR 70 | Temp 97.4°F | Ht 66.0 in | Wt 247.0 lb

## 2020-07-06 DIAGNOSIS — R7303 Prediabetes: Secondary | ICD-10-CM | POA: Diagnosis not present

## 2020-07-06 DIAGNOSIS — J9611 Chronic respiratory failure with hypoxia: Secondary | ICD-10-CM | POA: Diagnosis not present

## 2020-07-06 DIAGNOSIS — G250 Essential tremor: Secondary | ICD-10-CM

## 2020-07-06 DIAGNOSIS — J449 Chronic obstructive pulmonary disease, unspecified: Secondary | ICD-10-CM | POA: Diagnosis not present

## 2020-07-06 DIAGNOSIS — E039 Hypothyroidism, unspecified: Secondary | ICD-10-CM

## 2020-07-06 DIAGNOSIS — I10 Essential (primary) hypertension: Secondary | ICD-10-CM

## 2020-07-06 LAB — TSH: TSH: 14.73 u[IU]/mL — ABNORMAL HIGH (ref 0.35–4.50)

## 2020-07-06 LAB — COMPREHENSIVE METABOLIC PANEL
ALT: 18 U/L (ref 0–35)
AST: 22 U/L (ref 0–37)
Albumin: 3.9 g/dL (ref 3.5–5.2)
Alkaline Phosphatase: 73 U/L (ref 39–117)
BUN: 21 mg/dL (ref 6–23)
CO2: 26 mEq/L (ref 19–32)
Calcium: 10.3 mg/dL (ref 8.4–10.5)
Chloride: 103 mEq/L (ref 96–112)
Creatinine, Ser: 0.97 mg/dL (ref 0.40–1.20)
GFR: 56.46 mL/min — ABNORMAL LOW (ref >60.00)
Glucose, Bld: 78 mg/dL (ref 70–99)
Potassium: 3.8 mEq/L (ref 3.5–5.1)
Sodium: 138 mEq/L (ref 135–145)
Total Bilirubin: 0.7 mg/dL (ref 0.2–1.2)
Total Protein: 6.7 g/dL (ref 6.0–8.3)

## 2020-07-06 LAB — CBC
HCT: 51 % — ABNORMAL HIGH (ref 36.0–46.0)
Hemoglobin: 16.9 g/dL — ABNORMAL HIGH (ref 12.0–15.0)
MCHC: 33.2 g/dL (ref 30.0–36.0)
MCV: 93.7 fl (ref 78.0–100.0)
Platelets: 181 10*3/uL (ref 150.0–400.0)
RBC: 5.44 Mil/uL — ABNORMAL HIGH (ref 3.87–5.11)
RDW: 16 % — ABNORMAL HIGH (ref 11.5–15.5)
WBC: 10.8 10*3/uL — ABNORMAL HIGH (ref 4.0–10.5)

## 2020-07-06 LAB — LIPID PANEL
Cholesterol: 173 mg/dL (ref 0–200)
HDL: 45.9 mg/dL (ref >39.00)
LDL Cholesterol: 102 mg/dL — ABNORMAL HIGH (ref 0–99)
NonHDL: 127.15
Total CHOL/HDL Ratio: 4
Triglycerides: 124 mg/dL (ref 0.0–149.0)
VLDL: 24.8 mg/dL (ref 0.0–40.0)

## 2020-07-06 LAB — T4, FREE: Free T4: 1.19 ng/dL (ref 0.60–1.60)

## 2020-07-06 LAB — HEMOGLOBIN A1C: Hgb A1c MFr Bld: 6.5 % (ref 4.6–6.5)

## 2020-07-06 NOTE — Assessment & Plan Note (Signed)
Will recheck labs on the generic levothyroxine

## 2020-07-06 NOTE — Patient Instructions (Signed)
Please cut the propranolol in half and take 1/2 tab (20mg ) twice a day for a week. Then cut down to 1/2 tab daily for 1 week If you are doing okay after that, you can stop. If the tremor gets very bad, just go back to the propranolol 40mg  twice a day

## 2020-07-06 NOTE — Assessment & Plan Note (Signed)
BP Readings from Last 3 Encounters:  07/06/20 126/80  09/12/19 130/86  09/07/19 (!) 160/90   Good control Will assess if off the propranolol

## 2020-07-06 NOTE — Assessment & Plan Note (Signed)
Chronic dyspnea Oxygen dependent Continues on symbicort

## 2020-07-06 NOTE — Assessment & Plan Note (Signed)
Uses all night and much of day If just sitting at home, can take off for a while

## 2020-07-06 NOTE — Progress Notes (Signed)
Subjective:    Patient ID: Kari Medina, female    DOB: 04-Aug-1946, 74 y.o.   MRN: [id number]  HPI Here due to concerns about propranolol side effects and fatigue This visit occurred during the SARS-CoV-2 public health emergency.  Safety protocols were in place, including screening questions prior to the visit, additional usage of staff PPE, and extensive cleaning of exam room while observing appropriate contact time as indicated for disinfecting solutions.   Always fatigued No new change  Interested in trying off the propranolol Sister also has essential tremor and was on propranolol They both are having bladder problems--thinks it may be related to propranolol  Has mixed stress and urge incontinence  Has been on the propranolol for years Using oral CBD oil--feels it has helped her tremors Still has some hand tremors Mild functional limitations currently--has to be careful if cutting things (like vegetable)  Chronic oxygen use Stable dyspnea Has COPD  Current Outpatient Medications on File Prior to Visit  Medication Sig Dispense Refill  . acetaminophen (TYLENOL) 650 MG CR tablet Take 650 mg by mouth 3 (three) times daily.    Marland Kitchen albuterol (PROVENTIL HFA) 108 (90 Base) MCG/ACT inhaler Inhale 2 puffs into the lungs every 6 (six) hours as needed for wheezing or shortness of breath. 18 g 2  . aspirin EC 81 MG EC tablet Take 1 tablet (81 mg total) by mouth daily. 30 tablet 0  . b complex vitamins tablet Take 1 tablet by mouth daily.      . Bacillus Coagulans-Inulin (PROBIOTIC FORMULA PO) Take by mouth.    . budesonide-formoterol (SYMBICORT) 160-4.5 MCG/ACT inhaler Inhale 2 puffs into the lungs 2 (two) times daily.    . calcium carbonate (OS-CAL) 600 MG TABS Take 600 mg by mouth daily with breakfast.     . cholecalciferol (VITAMIN D) 1000 units tablet Take 2,000 Units by mouth 2 (two) times daily.    . DULoxetine (CYMBALTA) 60 MG capsule TAKE 1 CAPSULE(60 MG) BY MOUTH DAILY 90  capsule 3  . fluticasone (FLONASE) 50 MCG/ACT nasal spray Place 1 spray into both nostrils daily as needed. For stuffy nose  3  . glucose blood (ONETOUCH VERIO) test strip Use to check blood sugar once a day. E11.9 100 each 3  . hydrochlorothiazide (HYDRODIURIL) 12.5 MG tablet TAKE 1 TABLET(12.5 MG) BY MOUTH EVERY DAY 90 tablet 3  . levothyroxine (SYNTHROID) 200 MCG tablet TAKE 1 TABLET(200 MCG) BY MOUTH DAILY BEFORE BREAKFAST 90 tablet 3  . Magnesium 500 MG TABS Take 500 mg by mouth 2 (two) times daily.    . metFORMIN (GLUCOPHAGE-XR) 500 MG 24 hr tablet TAKE 1 TABLET(500 MG) BY MOUTH DAILY WITH BREAKFAST 90 tablet 3  . montelukast (SINGULAIR) 10 MG tablet TAKE 1 TABLET(10 MG) BY MOUTH AT BEDTIME 90 tablet 3  . ONETOUCH DELICA LANCETS FINE MISC 1 each by Does not apply route daily. Use to check blood sugar once a day. E11.9 100 each 3  . OVER THE COUNTER MEDICATION EYE SUPPLEMENT...VISION ALIVE    . OVER THE COUNTER MEDICATION "Lung Supplement"    . potassium chloride (K-DUR) 10 MEQ tablet Take 1 tablet (10 mEq total) by mouth daily. 30 tablet 0  . propranolol (INDERAL) 40 MG tablet Take 1 tablet (40 mg total) by mouth 2 (two) times daily. 180 tablet 3  . rOPINIRole (REQUIP) 3 MG tablet Take 1 tablet (3 mg total) by mouth at bedtime. 90 tablet 3  . Turmeric Curcumin 500 MG CAPS  Take 500 mg by mouth at bedtime.     . traMADol (ULTRAM) 50 MG tablet Take 0.5-1 tablets (25-50 mg total) by mouth 3 (three) times daily as needed. (Patient not taking: Reported on 07/06/2020) 60 tablet 0  . vitamin C (ASCORBIC ACID) 500 MG tablet Take 500 mg by mouth daily. (Patient not taking: Reported on 07/06/2020)     No current facility-administered medications on file prior to visit.    Allergies  Allergen Reactions  . Latex Hives  . Nickel Hives  . Pramipexole Other (See Comments)    Caused hallucinations, says he can take name brand.  . Prednisone     Makes her "feel crazy"  . Topiramate Other (See  Comments)    "spaced out"  . Citalopram Anxiety  . Paroxetine Hcl Anxiety  . Sertraline Anxiety    Past Medical History:  Diagnosis Date  . Asthma   . Depression   . Diabetes mellitus without complication ([place])   . Dyspnea    doe  . Dysrhythmia    extra beat  . Fatty liver   . Fibromyalgia   . Generalized osteoarthritis of multiple sites   . History of hiatal hernia   . Hypertension   . Hypothyroidism   . Melanoma ([place]) 07/2018   Right leg  . OSA (obstructive sleep apnea)   . Pain    chronic ruq and back pain  . Panic attacks   . Raynaud disease   . RLS (restless legs syndrome)   . 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 PATIENT IS UNCERTAIN  . Tremor, essential     Past Surgical History:  Procedure Laterality Date  . CHOLECYSTECTOMY    . DILATATION & CURETTAGE/HYSTEROSCOPY WITH MYOSURE N/A 11/10/2018   Procedure: DILATATION & CURETTAGE/HYSTEROSCOPY WITH MYOSURE/MYOMECTOMY;  Surgeon: Aletha Halim, MD;  Location: [facility];  Service: Gynecology;  Laterality: N/A;  possible myosure.  Please use myosure scope, do not open myosure blades but have in the room  . JOINT REPLACEMENT Bilateral    2008/2011  . MELANOMA EXCISION  07/2018  . REVERSE SHOULDER ARTHROPLASTY Right 05/28/2017   Procedure: REVERSE SHOULDER ARTHROPLASTY;  Surgeon: Corky Mull, MD;  Location: [facility];  Service: Orthopedics;  Laterality: Right;  . RHINOPLASTY  1972  . THYROIDECTOMY  2006  . TOTAL KNEE ARTHROPLASTY     bilateral    Family History  Problem Relation Age of Onset  . Osteoarthritis Mother   . Diabetes Mother   . Cirrhosis Mother   . Cancer Father   . Kidney cancer Father   . Bladder Cancer Father   . Heart disease Brother        stents in 1 brother  . Breast cancer Neg Hx     Social History   Socioeconomic History  . Marital status: Married    Spouse name: Not on file  . Number of children: 1  . Years of education: Not on file  .  Highest education level: Not on file  Occupational History  . Occupation: Neurosurgeon    Comment: Retired  Tobacco Use  . Smoking status: Former Smoker    Quit date: 12/22/1988    Years since quitting: 31.5  . Smokeless tobacco: Never Used  Vaping Use  . Vaping Use: Never used  Substance and Sexual Activity  . Alcohol use: No  . Drug use: No  . Sexual activity: Not on file  Other Topics Concern  .  Not on file  Social History Narrative   1 daughter      Has living will   Husband has health care POA. Alternate would be daughter Judson Roch   Would allow resuscitation but no prolonged machines   Not sure about feeding tubes   Social Determinants of Health   Financial Resource Strain:   . Difficulty of Paying Living Expenses:   Food Insecurity:   . Worried About Charity fundraiser in the Last Year:   . Arboriculturist in the Last Year:   Transportation Needs:   . Film/video editor (Medical):   Marland Kitchen Lack of Transportation (Non-Medical):   Physical Activity:   . Days of Exercise per Week:   . Minutes of Exercise per Session:   Stress:   . Feeling of Stress :   Social Connections:   . Frequency of Communication with Friends and Family:   . Frequency of Social Gatherings with Friends and Family:   . Attends Religious Services:   . Active Member of Clubs or Organizations:   . Attends Archivist Meetings:   Marland Kitchen Marital Status:   Intimate Partner Violence:   . Fear of Current or Ex-Partner:   . Emotionally Abused:   Marland Kitchen Physically Abused:   . Sexually Abused:    Review of Systems No chest pain      Objective:   Physical Exam Constitutional:      Appearance: Normal appearance.  Neurological:     Mental Status: She is alert.     Comments: Mild left hand tremor and slight in head            Assessment & Plan:

## 2020-07-06 NOTE — Assessment & Plan Note (Signed)
Discussed that I think it is unlikely that the propranolol is causing her mixed incontinence Fatigue would be more likely side effect I am okay with her trying to wean off though Will give weaning schedule to see if she tolerates--and makes incontinence better She is resistant to primidone Options are gabapentin or topamax--she also doesn't want them  Discussed that it sounds like she will have to restart the propranolol if her tremor gets real bad

## 2020-07-06 NOTE — Assessment & Plan Note (Signed)
On metformin Will check labs 

## 2020-07-11 DIAGNOSIS — M542 Cervicalgia: Secondary | ICD-10-CM | POA: Diagnosis not present

## 2020-07-11 DIAGNOSIS — R200 Anesthesia of skin: Secondary | ICD-10-CM | POA: Diagnosis not present

## 2020-07-11 DIAGNOSIS — R202 Paresthesia of skin: Secondary | ICD-10-CM | POA: Diagnosis not present

## 2020-07-11 DIAGNOSIS — G5622 Lesion of ulnar nerve, left upper limb: Secondary | ICD-10-CM | POA: Diagnosis not present

## 2020-07-17 DIAGNOSIS — G2581 Restless legs syndrome: Secondary | ICD-10-CM | POA: Diagnosis not present

## 2020-07-17 DIAGNOSIS — J310 Chronic rhinitis: Secondary | ICD-10-CM | POA: Diagnosis not present

## 2020-07-17 DIAGNOSIS — G4733 Obstructive sleep apnea (adult) (pediatric): Secondary | ICD-10-CM | POA: Diagnosis not present

## 2020-07-17 DIAGNOSIS — R030 Elevated blood-pressure reading, without diagnosis of hypertension: Secondary | ICD-10-CM | POA: Diagnosis not present

## 2020-07-17 DIAGNOSIS — R0600 Dyspnea, unspecified: Secondary | ICD-10-CM | POA: Diagnosis not present

## 2020-07-27 NOTE — Telephone Encounter (Signed)
Since it is mostly for the tremor---I am fine with her trying off it and we can see how she does

## 2020-07-27 NOTE — Telephone Encounter (Signed)
I called patient.  Patient said she's going off Propanolol. Patient weaned herself off the Propanolol.  She took her last pill last Sunday.   Patient said she wants to wait and see how she feels since she stopped the medication.  Patient said if she's still feeling fatigued, she'll call back and schedule appointment.

## 2020-09-11 DIAGNOSIS — R5383 Other fatigue: Secondary | ICD-10-CM | POA: Diagnosis not present

## 2020-10-08 ENCOUNTER — Telehealth: Payer: Self-pay | Admitting: Internal Medicine

## 2020-10-08 DIAGNOSIS — E039 Hypothyroidism, unspecified: Secondary | ICD-10-CM

## 2020-10-08 DIAGNOSIS — R7303 Prediabetes: Secondary | ICD-10-CM

## 2020-10-08 NOTE — Telephone Encounter (Signed)
Please let her know that I sent in the referral and she should hear about scheduling an appointment in the near future

## 2020-10-08 NOTE — Telephone Encounter (Signed)
Pt called and says she is struggling w/her energy levels and would like to be referred to an endocrinologist as soon as she could get an appt.  She would like a referral to see Dr. Cruzita Lederer in [place].  Please advise.  Thank you!

## 2020-10-08 NOTE — Telephone Encounter (Signed)
Left message on verified vm.

## 2020-11-08 ENCOUNTER — Encounter: Payer: Self-pay | Admitting: Internal Medicine

## 2020-11-12 DIAGNOSIS — G4733 Obstructive sleep apnea (adult) (pediatric): Secondary | ICD-10-CM | POA: Diagnosis not present

## 2020-11-12 DIAGNOSIS — R0600 Dyspnea, unspecified: Secondary | ICD-10-CM | POA: Diagnosis not present

## 2020-11-12 DIAGNOSIS — I712 Thoracic aortic aneurysm, without rupture: Secondary | ICD-10-CM | POA: Diagnosis not present

## 2020-11-12 DIAGNOSIS — R918 Other nonspecific abnormal finding of lung field: Secondary | ICD-10-CM | POA: Diagnosis not present

## 2020-11-12 DIAGNOSIS — Z9981 Dependence on supplemental oxygen: Secondary | ICD-10-CM | POA: Diagnosis not present

## 2020-11-13 ENCOUNTER — Other Ambulatory Visit: Payer: Self-pay | Admitting: Specialist

## 2020-11-13 DIAGNOSIS — I7121 Aneurysm of the ascending aorta, without rupture: Secondary | ICD-10-CM

## 2020-11-13 DIAGNOSIS — R918 Other nonspecific abnormal finding of lung field: Secondary | ICD-10-CM

## 2020-11-13 DIAGNOSIS — I712 Thoracic aortic aneurysm, without rupture: Secondary | ICD-10-CM

## 2020-11-13 DIAGNOSIS — G4733 Obstructive sleep apnea (adult) (pediatric): Secondary | ICD-10-CM

## 2020-11-19 ENCOUNTER — Encounter: Payer: Medicare Other | Admitting: Internal Medicine

## 2020-11-26 ENCOUNTER — Other Ambulatory Visit: Payer: Self-pay

## 2020-11-26 ENCOUNTER — Ambulatory Visit
Admission: RE | Admit: 2020-11-26 | Discharge: 2020-11-26 | Disposition: A | Discharge status: Home / Self Care | Payer: Medicare Other | Source: Ambulatory Visit | Attending: Specialist | Admitting: Specialist

## 2020-11-26 DIAGNOSIS — R918 Other nonspecific abnormal finding of lung field: Secondary | ICD-10-CM | POA: Diagnosis not present

## 2020-11-26 DIAGNOSIS — G4733 Obstructive sleep apnea (adult) (pediatric): Secondary | ICD-10-CM | POA: Diagnosis not present

## 2020-11-26 DIAGNOSIS — J219 Acute bronchiolitis, unspecified: Secondary | ICD-10-CM | POA: Diagnosis not present

## 2020-11-26 DIAGNOSIS — I313 Pericardial effusion (noninflammatory): Secondary | ICD-10-CM | POA: Diagnosis not present

## 2020-11-26 DIAGNOSIS — I712 Thoracic aortic aneurysm, without rupture: Secondary | ICD-10-CM | POA: Insufficient documentation

## 2020-11-26 DIAGNOSIS — J679 Hypersensitivity pneumonitis due to unspecified organic dust: Secondary | ICD-10-CM | POA: Diagnosis not present

## 2020-11-26 DIAGNOSIS — I7121 Aneurysm of the ascending aorta, without rupture: Secondary | ICD-10-CM

## 2020-11-27 ENCOUNTER — Ambulatory Visit: Payer: Medicare Other

## 2020-11-27 DIAGNOSIS — I10 Essential (primary) hypertension: Secondary | ICD-10-CM

## 2020-11-27 DIAGNOSIS — E039 Hypothyroidism, unspecified: Secondary | ICD-10-CM

## 2020-11-27 NOTE — Chronic Care Management (AMB) (Signed)
Chronic Care Management Pharmacy  Name: Kari Medina  MRN: [id number] DOB: 06-06-1946  Chief Complaint/ HPI  Kari Medina,  74 y.o., female presents for their Follow-Up CCM visit with the clinical pharmacist via telephone.  PCP : Venia Carbon, MD   Their chronic conditions include: hypertension, sleep apnea, mood disorder, chronic bronchitis, essential tremor, osteoarthrosis, restless leg syndrome, pre-diabetes  Patient concerns: Reports she is doing well and has recovered from bronchitis.   Office Visits:  10/08/20: Patient call - would like referral to endocrinology to due low energy  06/26/20: [place] - propranolol taper, resume 40 mg BID if tremor worsens   09/12/19: [place] - continue current meds, health maintenance - flu vaccine, exercise, mammogram next year, shingrix. rtc 1 year  Consult Visit:  11/14/20: Contact with COVID-19 - test completed  11/12/20: Pulmonology - SOB, weight is major factor, continue duoneb, Singulair, Symbicort, portable oxygen, CPAP  01/09/20: Pulmonology - change breo to symbicort 160/4.5 two puffs bid, follow up 2 weeks   11/04/19: Lower back pain - no note available  09/07/19: Cardiology - Thoracic aortic aneurysm without rupture, maintain BP control, rtc 2 years   Allergies  Allergen Reactions  . Latex Hives  . Nickel Hives  . Pramipexole Other (See Comments)    Caused hallucinations, says he can take name brand.  . Prednisone     Makes her "feel crazy"  . Topiramate Other (See Comments)    "spaced out"  . Citalopram Anxiety  . Paroxetine Hcl Anxiety  . Sertraline Anxiety   Medications: Outpatient Encounter Medications as of 11/27/2020  Medication Sig  . acetaminophen (TYLENOL) 650 MG CR tablet Take 650 mg by mouth 3 (three) times daily.  Marland Kitchen albuterol (PROVENTIL HFA) 108 (90 Base) MCG/ACT inhaler Inhale 2 puffs into the lungs every 6 (six) hours as needed for wheezing or shortness of breath.  Marland Kitchen aspirin EC 81 MG  EC tablet Take 1 tablet (81 mg total) by mouth daily.  Marland Kitchen b complex vitamins tablet Take 1 tablet by mouth daily.    . Bacillus Coagulans-Inulin (PROBIOTIC FORMULA PO) Take by mouth.  . budesonide-formoterol (SYMBICORT) 160-4.5 MCG/ACT inhaler Inhale 2 puffs into the lungs 2 (two) times daily.  . calcium carbonate (OS-CAL) 600 MG TABS Take 600 mg by mouth daily with breakfast.   . cholecalciferol (VITAMIN D) 1000 units tablet Take 2,000 Units by mouth 2 (two) times daily.  . DULoxetine (CYMBALTA) 60 MG capsule TAKE 1 CAPSULE(60 MG) BY MOUTH DAILY  . fluticasone (FLONASE) 50 MCG/ACT nasal spray Place 1 spray into both nostrils daily as needed. For stuffy nose  . glucose blood (ONETOUCH VERIO) test strip Use to check blood sugar once a day. E11.9  . hydrochlorothiazide (HYDRODIURIL) 12.5 MG tablet TAKE 1 TABLET(12.5 MG) BY MOUTH EVERY DAY  . levothyroxine (SYNTHROID) 200 MCG tablet TAKE 1 TABLET(200 MCG) BY MOUTH DAILY BEFORE BREAKFAST  . Magnesium 500 MG TABS Take 500 mg by mouth 2 (two) times daily.  . metFORMIN (GLUCOPHAGE-XR) 500 MG 24 hr tablet TAKE 1 TABLET(500 MG) BY MOUTH DAILY WITH BREAKFAST  . montelukast (SINGULAIR) 10 MG tablet TAKE 1 TABLET(10 MG) BY MOUTH AT BEDTIME  . ONETOUCH DELICA LANCETS FINE MISC 1 each by Does not apply route daily. Use to check blood sugar once a day. E11.9  . OVER THE COUNTER MEDICATION EYE SUPPLEMENT...VISION ALIVE  . OVER THE COUNTER MEDICATION "Lung Supplement"  . potassium chloride (K-DUR) 10 MEQ tablet Take 1 tablet (10 mEq total)  by mouth daily.  . propranolol (INDERAL) 40 MG tablet Take 1 tablet (40 mg total) by mouth 2 (two) times daily.  Marland Kitchen rOPINIRole (REQUIP) 3 MG tablet Take 1 tablet (3 mg total) by mouth at bedtime.  . traMADol (ULTRAM) 50 MG tablet Take 0.5-1 tablets (25-50 mg total) by mouth 3 (three) times daily as needed. (Patient not taking: Reported on 07/06/2020)  . Turmeric Curcumin 500 MG CAPS Take 500 mg by mouth at bedtime.   . vitamin C  (ASCORBIC ACID) 500 MG tablet Take 500 mg by mouth daily. (Patient not taking: Reported on 07/06/2020)   No facility-administered encounter medications on file as of 11/27/2020.   Current Diagnosis/Assessment: Goals    . Pharmacy Care Plan     CARE PLAN ENTRY  Current Barriers:  . Chronic Disease Management support, education, and care coordination needs related to Hypertension, Hyperlipidemia, and Diabetes   Hypertension BP Readings from Last 3 Encounters:  07/06/20 126/80  09/12/19 130/86  09/07/19 (!) 160/90 .  Pharmacist Clinical Goal(s): o Over the next 3 months, patient will work with PharmD and providers to maintain BP goal <140/90 mmHg . Current regimen:  o Hydrochlorothiazide 12.5 mg - 1 tablet daily  . Interventions: o Reviewed clinic BP and pertinent labs - within goal . Patient self care activities - Over the next 3 months, patient will: o Check blood pressure for 7 days prior to office visit with Dr. Silvio Pate or pharmacist, document, and provide at future appointments o Ensure daily salt intake < 2300 mg/day  Hyperlipidemia Lab Results  Component Value Date/Time   LDLCALC 102 (H) 07/06/2020 11:59 AM .  Pharmacist Clinical Goal(s): o Over the next 3 months, patient will work with PharmD and providers to achieve LDL goal < 70 . Current regimen:  o No pharmacotherapy . Interventions: o Recommend considering cholesterol medication  o Discussed weight loss goals including diet and exercise. Weight loss may improve energy levels. . Patient self care activities - Over the next 3 months, patient will: o Consider starting a cholesterol medication o Slowly increase exercise with goal of 30 minutes, 5 days per week o Incorporate a healthy diet high in vegetables, fruits and whole grains with low-fat dairy products, chicken, fish, legumes, non-tropical vegetable oils and nuts. Limit intake of sweets, sugar-sweetened beverages and red meats. o Make progress towards personal goal  of weight loss: 70 pounds (most recent weight reported 243 lbs)  Hypothyroidism Lab Results  Component Value Date/Time   TSH 14.73 (H) 07/06/2020 11:59 AM   TSH 0.98 09/12/2019 04:57 PM   FREET4 1.19 07/06/2020 11:59 AM   FREET4 1.61 (H) 09/12/2019 04:57 PM   . Pharmacist Clinical Goal(s): o Over the next 3 months, patient will work with PharmD and providers to improve symptoms of hypothyroidism . Current regimen:  o Levothyroxine 200 mg - 1 tablet daily 1 hour before breakfast or other medications . Interventions: o Reviewed labs - TSH elevated o Consult PCP for dose adjustment . Patient self care activities - Over the next 3 months, patient will: o Continue current medications until further contact from clinic  Patient request: Consult PCP for urology referral  Please see past updates related to this goal by clicking on the "Past Updates" button in the selected goal       Hyperlipidemia   Last lipids Lab Results  Component Value Date   CHOL 173 07/06/2020   HDL 45.90 07/06/2020   LDLCALC 102 (H) 07/06/2020   TRIG 124.0 07/06/2020  CHOLHDL 4 07/06/2020   Hepatic Function Latest Ref Rng & Units 07/06/2020 09/12/2019 11/08/2018  Total Protein 6.0 - 8.3 g/dL 6.7 7.5 7.7  Albumin 3.5 - 5.2 g/dL 3.9 3.9 3.3(L)  AST 0 - 37 U/L _0 ALT 0 - 35 U/L _1 Alk Phosphatase 39 - 117 U/L 73 71 82  Total Bilirubin 0.2 - 1.2 mg/dL 0.7 0.5 0.6    CBC Latest Ref Rng & Units 07/06/2020 09/12/2019 11/01/2018  WBC 4.0 - 10.5 K/uL 10.8(H) 10.6(H) 14.5(H)  Hemoglobin 12.0 - 15.0 g/dL 16.9(H) 16.1(H) 16.2(H)  Hematocrit 36.0 - 46.0 % 51.0(H) 48.8(H) 48.6(H)  Platelets 150.0 - 400.0 K/uL 181.0 282.0 359.0   LDL goal < 100 Patient has failed these meds in past: none Patient is currently uncontrolled on the following medications:   No pharmacotherapy  We discussed at initial CCM: Patient reports she needs to work on weight loss, current weight is 243 lbs, wants to lose 70 lbs.  Energy and back pain limit exercise, interested in increasing exercise with home bicycle. She is open to cholesterol medication if needed, but prefers not to start anything new.  Update 11/27/20: Discussed lipid results. Pt reports she is trying to eat better. She is limiting overall calorie intake. Discussed exercise and healthy fats. Unable to exercise due to SOB. Discussed her weight loss goals but pt is very fatigued. Encouraged small steps towards more activity.  Plan: Continue non-pharm management including heart healthy diet. Consider starting a statin.  Hypertension   CMP Latest Ref Rng & Units 07/06/2020 09/12/2019 11/08/2018  Glucose 70 - 99 mg/dL 78 84 85  BUN 6 - 23 mg/dL _2 Creatinine 0.40 - 1.20 mg/dL 0.97 0.99 1.04(H)  Sodium 135 - 145 mEq/L 138 141 137  Potassium 3.5 - 5.1 mEq/L 3.8 4.5 4.4  Chloride 96 - 112 mEq/L 103 103 105  CO2 19 - 32 mEq/L _3 Calcium 8.4 - 10.5 mg/dL 10.3 10.3 9.6  Total Protein 6.0 - 8.3 g/dL 6.7 7.5 7.7  Total Bilirubin 0.2 - 1.2 mg/dL 0.7 0.5 0.6  Alkaline Phos 39 - 117 U/L 73 71 82  AST 0 - 37 U/L _4 ALT 0 - 35 U/L _5 Office blood pressures are: BP Readings from Last 3 Encounters:  07/06/20 126/80  09/12/19 130/86  09/07/19 (!) 160/90   BP < 140/90 mmHg Sleep apnea: CPAP nightly with oxygen  Patient has failed these meds in the past: amlodipine (swelling) Patient checks BP at home: infrequently, but has home monitor  Patient home BP readings are ranging: none reported  Patient is currently controlled on the following medications:   Hydrochlorothiazide 12.5 mg - 1 tablet daily   Update 11/27/20: BP in clinic controlled, continues HCTZ daily  Plan: Continue current medications   Pre-Diabetes   Lab Results  Component Value Date   CREATININE 0.97 07/06/2020   BUN 21 07/06/2020   GFR 56.46 (L) 07/06/2020   GFRNONAA 53 (L) 11/08/2018   GFRAA >60 11/08/2018   NA 138 07/06/2020   K 3.8 07/06/2020    CALCIUM 10.3 07/06/2020   CO2 26 07/06/2020   Recent Relevant Labs: Lab Results  Component Value Date/Time   HGBA1C 6.5 07/06/2020 11:59 AM   HGBA1C 6.6 (H) 09/12/2019 04:57 PM    Checking BG: none, but has a glucometer   Patient has failed these meds in past: none Patient is currently controlled  on the following medications:   Metformin 500 mg - 1 tablet daily with breakfast   Update 11/27/20: A1c controlled, stable on metformin   Plan: Continue current medications  Hypothyroidism   Per Pulmonology clinic: s/p thyroid resection,hashimoto/nodules  TSH  Date Value Ref Range Status  07/06/2020 14.73 (H) 0.35 - 4.50 uIU/mL Final    Patient has failed these meds in past: switched from Synthroid 12/2019 to generic due to cost Patient is currently uncontrolled on the following medications:   Levothyroxine 200 mcg - 1 tablet daily 1 hour before breakfast  Update 11/27/20: Discussed elevated TSH - consult PCP. Pt has new consult with endocrinology 01/10/20. Pt continues to have fatigue. Reports it is hard to get out of bed some days. Husband helps a lot with household chores. Tries to stay busy on [employer], ministering to others. Patient reports currently taking at lunch, recommend taking by itself in the morning 1 hour before other meds/food.  Plan: Continue current medications; Consult PCP regarding elevated TSH. Retimed levothyroxine to before breakfast.  Essential Tremor   Patient has failed these meds in past: gabapentin (didn't tolerate) Patient is currently controlled on the following medications:   Propranolol 40 mg - 1 tablet BID  CBD oil supplement   We discussed at initial CCM visit: reports taking propranolol > 5 years, helps with tremors but not as much as it used to and thinks it causes urge incontinence symptoms  Update 11/27/20: Tried to slowly taper propranolol and tremors worsened. Denies any change in bladder symptoms while off propranolol. Doing better now  that she has resumed therapy.  Plan: Continue current medications  Urinary Incontinence    Patient has failed these meds in past: Myrbetriq Patient is currently uncontrolled on the following medications:   No pharmacotherapy  Update 11/27/20: Reports her symptoms are impacting her daily life. Would like a urology referral, previously seen at [facility], would like different opinion to discuss surgical options.  Plan: Consult PCP for referral  Vaccines   Reviewed and discussed patient's vaccination history.    Immunization History  Administered Date(s) Administered  . Fluad Quad(high Dose 65+) 09/12/2019  . Influenza,inj,Quad PF,6+ Mos 09/06/2018  . Influenza-Unspecified 10/27/2017  . Pneumococcal Conjugate-13 04/16/2016  . Pneumococcal Polysaccharide-23 08/20/2017   Plan: Recommended patient receive COVID vaccine, Tdap and Shingrix.   Supplement Review    Patient would like drug interaction review  Patient has failed these meds in past: none  Patient is currently on the following medications:   Vision Alive Max - 1 daily (not taking)  Vitamin B-complex - 1 daily*  Aspirin 325 mg - 1/2 tablet daily  *  Probiotic -1 capsule daily (nit taking)  Vitamin D3 2000 IU - BID*  Azo Bladder Control - 1 daily   Vitamin C 500 mg - 1 daily *  Vitamin E - daily*  Calcium 600 mg - 1 tablet every other day *  Zinc - 1 tablet daily (immune health)  Magnesium 500 mg - 1 tablet BID (cramps)  Tumeric 500 mg - 1 qhs /cucermin *  [employer] Liver supplement (not taking)  CBD gummy - taking for COPD  Taking high dose aspirin due to fear about aneurism - discussed changing to baby aspirin  Plan: Continue current medications; Separate levothyroxine from multivitamins, magnesium and calcium by 4 hours. Recommend reducing aspirin to 81 mg daily.  Medication Management  OTCs: Pharmacy/Benefits: [employer] - Medicare Part D/[facility] (acute) [facility] mail order    Adherence: denies  missed doses   Affordability: no concerns   CCM Follow Up:  f/u regarding elevated TSH in next few days  Debbora Dus, PharmD Clinical Pharmacist [facility] at [facility] [phone]

## 2020-12-06 ENCOUNTER — Telehealth: Payer: Self-pay

## 2020-12-06 NOTE — Patient Instructions (Signed)
Dear Kari Medina,  Below is a summary of the goals we discussed during our follow up appointment on Nov 27, 2020. Please contact me anytime with questions or concerns.   Visit Information  Goals Addressed            This Visit's Progress   . Pharmacy Care Plan       CARE PLAN ENTRY  Current Barriers:  . Chronic Disease Management support, education, and care coordination needs related to Hypertension, Hyperlipidemia, and Diabetes   Hypertension BP Readings from Last 3 Encounters:  07/06/20 126/80  09/12/19 130/86  09/07/19 (!) 160/90 .  Pharmacist Clinical Goal(s): o Over the next 3 months, patient will work with PharmD and providers to maintain BP goal <140/90 mmHg . Current regimen:  o Hydrochlorothiazide 12.5 mg - 1 tablet daily  . Interventions: o Reviewed clinic BP and pertinent labs - within goal . Patient self care activities - Over the next 3 months, patient will: o Check blood pressure for 7 days prior to office visit with Dr. Silvio Pate or pharmacist, document, and provide at future appointments o Ensure daily salt intake < 2300 mg/day  Hyperlipidemia Lab Results  Component Value Date/Time   LDLCALC 102 (H) 07/06/2020 11:59 AM .  Pharmacist Clinical Goal(s): o Over the next 3 months, patient will work with PharmD and providers to achieve LDL goal < 70 . Current regimen:  o No pharmacotherapy . Interventions: o Recommend considering cholesterol medication  o Discussed weight loss goals including diet and exercise. Weight loss may improve energy levels. . Patient self care activities - Over the next 3 months, patient will: o Consider starting a cholesterol medication o Slowly increase exercise with goal of 30 minutes, 5 days per week o Incorporate a healthy diet high in vegetables, fruits and whole grains with low-fat dairy products, chicken, fish, legumes, non-tropical vegetable oils and nuts. Limit intake of sweets, sugar-sweetened beverages and red  meats. o Make progress towards personal goal of weight loss: 70 pounds (most recent weight reported 243 lbs)  Hypothyroidism Lab Results  Component Value Date/Time   TSH 14.73 (H) 07/06/2020 11:59 AM   TSH 0.98 09/12/2019 04:57 PM   FREET4 1.19 07/06/2020 11:59 AM   FREET4 1.61 (H) 09/12/2019 04:57 PM   . Pharmacist Clinical Goal(s): o Over the next 3 months, patient will work with PharmD and providers to improve symptoms of hypothyroidism . Current regimen:  o Levothyroxine 200 mg - 1 tablet daily 1 hour before breakfast or other medications . Interventions: o Reviewed labs - TSH elevated o Consult PCP for dose adjustment . Patient self care activities - Over the next 3 months, patient will: o Continue current medications until further contact from clinic  Patient request: Consult PCP for urology referral  Please see past updates related to this goal by clicking on the "Past Updates" button in the selected goal        The patient verbalized understanding of instructions, educational materials, and care plan provided today and declined offer to receive copy of patient instructions, educational materials, and care plan.   The pharmacy team will reach out to the patient again over the next 30 days.   Debbora Dus, [facility]

## 2020-12-06 NOTE — Telephone Encounter (Signed)
I would not change the dose since the free T4 was well in the normal range and was high before.  She probably wants to do some research herself about a urogynecologist who specializes in incontinence surgery. It is very specialized (like at [facility] or [place]) and even when successful, long term results are not typical. I am not sure who would be best for this.

## 2020-12-06 NOTE — Telephone Encounter (Signed)
PCP consult regarding CCM 11/27/20:  Patient last TSH elevated and has not had a dose adjustment since. Currently taking levothyroxine 200 mcg daily. Patient switched from Synthroid to generic levothyroxine 12/2019 could have resulted in TSH elevation. Would you like to increase dose?  Patient also would like urology referral - wants an opinion on surgical options for her urinary incontinence.  Lab Results  Component Value Date/Time   TSH 14.73 (H) 07/06/2020 11:59 AM   TSH 0.98 09/12/2019 04:57 PM   FREET4 1.19 07/06/2020 11:59 AM   FREET4 1.61 (H) 09/12/2019 04:57 PM   Thanks,  Debbora Dus, PharmD Clinical Pharmacist [facility] at [facility] [phone]

## 2020-12-07 ENCOUNTER — Telehealth: Payer: Self-pay

## 2020-12-07 NOTE — Telephone Encounter (Signed)
Will have my assistant call patient, thanks!  Debbora Dus, PharmD Clinical Pharmacist [facility] at [facility] [phone]

## 2020-12-07 NOTE — Chronic Care Management (AMB) (Addendum)
Chronic Care Management Pharmacy Assistant   Name: Kari Medina  MRN: [id number] DOB: 07-11-1946   Patient was called to inform Dr. Silvio Pate did not want to adjust her levothyroxine dose at this time. He also recommends she look into a urogynecologist who specializes in incontinence surgery. He said it is very specialized (like at [facility] or [place]) and even when successful, long term results are not typical. He is not sure who would be best for this. Patient verbalized understanding.   PCP : Venia Carbon, MD  Allergies:   Allergies  Allergen Reactions   Latex Hives   Nickel Hives   Pramipexole Other (See Comments)    Caused hallucinations, says he can take name brand.   Prednisone     Makes her "feel crazy"   Topiramate Other (See Comments)    "spaced out"   Citalopram Anxiety   Paroxetine Hcl Anxiety   Sertraline Anxiety    Medications: Outpatient Encounter Medications as of 12/07/2020  Medication Sig   acetaminophen (TYLENOL) 650 MG CR tablet Take 650 mg by mouth 3 (three) times daily.   albuterol (PROVENTIL HFA) 108 (90 Base) MCG/ACT inhaler Inhale 2 puffs into the lungs every 6 (six) hours as needed for wheezing or shortness of breath.   aspirin EC 81 MG EC tablet Take 1 tablet (81 mg total) by mouth daily.   b complex vitamins tablet Take 1 tablet by mouth daily.     Bacillus Coagulans-Inulin (PROBIOTIC FORMULA PO) Take by mouth.   budesonide-formoterol (SYMBICORT) 160-4.5 MCG/ACT inhaler Inhale 2 puffs into the lungs 2 (two) times daily.   calcium carbonate (OS-CAL) 600 MG TABS Take 600 mg by mouth daily with breakfast.    cholecalciferol (VITAMIN D) 1000 units tablet Take 2,000 Units by mouth 2 (two) times daily.   DULoxetine (CYMBALTA) 60 MG capsule TAKE 1 CAPSULE(60 MG) BY MOUTH DAILY   fluticasone (FLONASE) 50 MCG/ACT nasal spray Place 1 spray into both nostrils daily as needed. For stuffy nose   glucose blood (ONETOUCH VERIO) test strip Use to check blood  sugar once a day. E11.9   hydrochlorothiazide (HYDRODIURIL) 12.5 MG tablet TAKE 1 TABLET(12.5 MG) BY MOUTH EVERY DAY   levothyroxine (SYNTHROID) 200 MCG tablet TAKE 1 TABLET(200 MCG) BY MOUTH DAILY BEFORE BREAKFAST   Magnesium 500 MG TABS Take 500 mg by mouth 2 (two) times daily.   metFORMIN (GLUCOPHAGE-XR) 500 MG 24 hr tablet TAKE 1 TABLET(500 MG) BY MOUTH DAILY WITH BREAKFAST   montelukast (SINGULAIR) 10 MG tablet TAKE 1 TABLET(10 MG) BY MOUTH AT BEDTIME   ONETOUCH DELICA LANCETS FINE MISC 1 each by Does not apply route daily. Use to check blood sugar once a day. E11.9   OVER THE COUNTER MEDICATION EYE SUPPLEMENT...VISION ALIVE   OVER THE COUNTER MEDICATION "Lung Supplement"   potassium chloride (K-DUR) 10 MEQ tablet Take 1 tablet (10 mEq total) by mouth daily.   propranolol (INDERAL) 40 MG tablet Take 1 tablet (40 mg total) by mouth 2 (two) times daily.   rOPINIRole (REQUIP) 3 MG tablet Take 1 tablet (3 mg total) by mouth at bedtime.   traMADol (ULTRAM) 50 MG tablet Take 0.5-1 tablets (25-50 mg total) by mouth 3 (three) times daily as needed. (Patient not taking: Reported on 07/06/2020)   Turmeric Curcumin 500 MG CAPS Take 500 mg by mouth at bedtime.    vitamin C (ASCORBIC ACID) 500 MG tablet Take 500 mg by mouth daily. (Patient not taking: Reported on 07/06/2020)  No facility-administered encounter medications on file as of 12/07/2020.    Current Diagnosis: Patient Active Problem List   Diagnosis Date Noted   Hypothyroidism 07/06/2020   Chronic hypoxemic respiratory failure ([place]) 07/06/2020   Bad odor of urine 12/29/2018   Prediabetes 09/06/2018   History of melanoma 07/22/2018   Thoracic ascending aortic aneurysm ([place]) 04/07/2018   Mood disorder ([place]) 03/02/2018   Status post total bilateral knee replacement 12/03/2017   Preventative health care 08/20/2017   Urinary incontinence 08/20/2017   Advance directive discussed with patient 08/20/2017   Status post reverse total shoulder  replacement, right 05/28/2017   BMI 40.0-44.9, adult ([place]) 05/22/2016   COPD (chronic obstructive pulmonary disease) ([place]) 04/16/2016   Hypertension    Tremor, essential    OSA (obstructive sleep apnea)    RLS (restless legs syndrome)    Raynaud disease    Osteoarthrosis, generalized, multiple joints     Follow-Up:  Pharmacist Review   Debbora Dus, CPP notified  [name], [place] [facility] Assistant [phone]

## 2020-12-17 ENCOUNTER — Other Ambulatory Visit: Payer: Self-pay | Admitting: Internal Medicine

## 2021-01-09 ENCOUNTER — Ambulatory Visit (INDEPENDENT_AMBULATORY_CARE_PROVIDER_SITE_OTHER): Payer: Medicare Other | Admitting: Internal Medicine

## 2021-01-09 ENCOUNTER — Other Ambulatory Visit: Payer: Self-pay

## 2021-01-09 ENCOUNTER — Encounter: Payer: Self-pay | Admitting: Internal Medicine

## 2021-01-09 ENCOUNTER — Other Ambulatory Visit: Payer: Self-pay | Admitting: Internal Medicine

## 2021-01-09 VITALS — BP 130/80 | HR 78 | Ht 66.0 in | Wt 247.5 lb

## 2021-01-09 DIAGNOSIS — E063 Autoimmune thyroiditis: Secondary | ICD-10-CM

## 2021-01-09 DIAGNOSIS — E039 Hypothyroidism, unspecified: Secondary | ICD-10-CM | POA: Diagnosis not present

## 2021-01-09 LAB — TSH: TSH: 2.68 u[IU]/mL (ref 0.35–4.50)

## 2021-01-09 LAB — T4, FREE: Free T4: 1.97 ng/dL — ABNORMAL HIGH (ref 0.60–1.60)

## 2021-01-09 NOTE — Patient Instructions (Signed)

## 2021-01-09 NOTE — Progress Notes (Signed)
Name: Kari Medina  MRN/ DOB: [id number], 1946/04/10    Age/ Sex: 75 y.o., female    PCP: Venia Carbon, MD   Reason for Endocrinology Evaluation: Hypothyroidism     Date of Initial Endocrinology Evaluation: 01/09/2021     HPI: Kari Medina is a 75 y.o. female with a past medical history of HTN, raynaud's disease , OSA and COPD. The patient presented for initial endocrinology clinic visit on 01/09/2021 for consultative assistance with her Hypothyroidism   She has been diagnosed with Hashimoto's thyroiditis many years ago and was on a small dose  LT-4 replacement therapy until her total thyroidectomy surgery in 2006 secondary to what seems MNG, her dose has to be increased to 200 mcg.    Today she is accompanied by her spouse.  She is c/o fatigue and weight gain over the years . Of note, the pt on oxygen for COPD.    Has slight constipation  Denies depression but has anxiety   She takes levothyroxine between breakfast and lunch   Takes Calcium at night, has MVI that she takes whenever she thinks about it but mostly at night .    Sister with thyroid disease  Nephew with graves' disease as well as a daughter      ENDOCRINE HOME MEDICATION  Levothyroxine 200 mcg daily    HISTORY:  Past Medical History:  Past Medical History:  Diagnosis Date  . Asthma   . Depression   . Diabetes mellitus without complication ([place])   . Dyspnea    doe  . Dysrhythmia    extra beat  . Fatty liver   . Fibromyalgia   . Generalized osteoarthritis of multiple sites   . History of hiatal hernia   . Hypertension   . Hypothyroidism   . Melanoma ([place]) 07/2018   Right leg  . OSA (obstructive sleep apnea)   . Pain    chronic ruq and back pain  . Panic attacks   . Raynaud disease   . RLS (restless legs syndrome)   . 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 PATIENT IS UNCERTAIN  . Tremor, essential    Past Surgical History:  Past Surgical  History:  Procedure Laterality Date  . CHOLECYSTECTOMY    . DILATATION & CURETTAGE/HYSTEROSCOPY WITH MYOSURE N/A 11/10/2018   Procedure: DILATATION & CURETTAGE/HYSTEROSCOPY WITH MYOSURE/MYOMECTOMY;  Surgeon: Aletha Halim, MD;  Location: [place];  Service: Gynecology;  Laterality: N/A;  possible myosure.  Please use myosure scope, do not open myosure blades but have in the room  . JOINT REPLACEMENT Bilateral    2008/2011  . MELANOMA EXCISION  07/2018  . REVERSE SHOULDER ARTHROPLASTY Right 05/28/2017   Procedure: REVERSE SHOULDER ARTHROPLASTY;  Surgeon: Corky Mull, MD;  Location: [facility];  Service: Orthopedics;  Laterality: Right;  . RHINOPLASTY  1972  . THYROIDECTOMY  2006  . TOTAL KNEE ARTHROPLASTY     bilateral      Social History:  reports that she quit smoking about 32 years ago. She has never used smokeless tobacco. She reports that she does not drink alcohol and does not use drugs.  Family History: family history includes Bladder Cancer in her father; Cancer in her father; Cirrhosis in her mother; Diabetes in her mother; Heart disease in her brother; Kidney cancer in her father; Osteoarthritis in her mother.   HOME MEDICATIONS: Allergies as of 01/09/2021      Reactions  Latex Hives   Nickel Hives   Pramipexole Other (See Comments)   Caused hallucinations, says he can take name brand.   Prednisone    Makes her "feel crazy"   Topiramate Other (See Comments)   "spaced out"   Citalopram Anxiety   Paroxetine Hcl Anxiety   Sertraline Anxiety      Medication List       Accurate as of January 09, 2021  2:54 PM. If you have any questions, ask your nurse or doctor.        acetaminophen 650 MG CR tablet Commonly known as: TYLENOL Take 650 mg by mouth 3 (three) times daily.   albuterol 108 (90 Base) MCG/ACT inhaler Commonly known as: Proventil HFA Inhale 2 puffs into the lungs every 6 (six) hours as needed for wheezing or shortness of breath.    aspirin 81 MG EC tablet Take 1 tablet (81 mg total) by mouth daily.   b complex vitamins tablet Take 1 tablet by mouth daily.   budesonide-formoterol 160-4.5 MCG/ACT inhaler Commonly known as: SYMBICORT Inhale 2 puffs into the lungs 2 (two) times daily.   calcium carbonate 600 MG Tabs tablet Commonly known as: OS-CAL Take 600 mg by mouth daily with breakfast.   cholecalciferol 1000 units tablet Commonly known as: VITAMIN D Take 2,000 Units by mouth 2 (two) times daily.   DULoxetine 60 MG capsule Commonly known as: CYMBALTA TAKE 1 CAPSULE(60 MG) BY MOUTH DAILY   fluticasone 50 MCG/ACT nasal spray Commonly known as: FLONASE Place 1 spray into both nostrils daily as needed. For stuffy nose   glucose blood test strip Commonly known as: OneTouch Verio Use to check blood sugar once a day. E11.9   hydrochlorothiazide 12.5 MG tablet Commonly known as: HYDRODIURIL TAKE 1 TABLET BY MOUTH  DAILY What changed: additional instructions Changed by: [name], MD   levothyroxine 200 MCG tablet Commonly known as: SYNTHROID TAKE 1 TABLET BY MOUTH  DAILY BEFORE BREAKFAST What changed: additional instructions Changed by: [name], MD   Magnesium 500 MG Tabs Take 500 mg by mouth 2 (two) times daily.   metFORMIN 500 MG 24 hr tablet Commonly known as: GLUCOPHAGE-XR TAKE 1 TABLET BY MOUTH  DAILY WITH BREAKFAST What changed: additional instructions Changed by: [name], MD   montelukast 10 MG tablet Commonly known as: SINGULAIR TAKE 1 TABLET BY MOUTH AT  BEDTIME What changed: additional instructions Changed by: [name], MD   OneTouch Delica Lancets Fine Misc 1 each by Does not apply route daily. Use to check blood sugar once a day. E11.9   OVER THE COUNTER MEDICATION EYE SUPPLEMENT...VISION ALIVE   OVER THE COUNTER MEDICATION "Lung Supplement"   potassium chloride 10 MEQ tablet Commonly known as: KLOR-CON Take 1 tablet (10 mEq total) by mouth  daily.   PROBIOTIC FORMULA PO Take by mouth.   propranolol 40 MG tablet Commonly known as: INDERAL TAKE 1 TABLET BY MOUTH  TWICE DAILY   rOPINIRole 3 MG tablet Commonly known as: REQUIP TAKE ONE TABLET BY MOUTH AT BEDTIME   traMADol 50 MG tablet Commonly known as: ULTRAM Take 0.5-1 tablets (25-50 mg total) by mouth 3 (three) times daily as needed.   Turmeric Curcumin 500 MG Caps Take 500 mg by mouth at bedtime.   vitamin C 500 MG tablet Commonly known as: ASCORBIC ACID Take 500 mg by mouth daily.         REVIEW OF SYSTEMS: A comprehensive ROS was conducted with the patient and is  negative except as per HPI    OBJECTIVE:  VS: BP 130/80   Pulse 78   Ht 5\' 6"  (1.676 m)   Wt 247 lb 8 oz (112.3 kg)   SpO2 92%   BMI 39.95 kg/m    Wt Readings from Last 3 Encounters:  01/09/21 247 lb 8 oz (112.3 kg)  07/06/20 247 lb (112 kg)  09/12/19 243 lb (110.2 kg)     EXAM: General: Pt appears well and is in NAD  In a wheel chair   Neck: General: Supple without adenopathy. Thyroid:   No goiter or nodules appreciated.   Lungs: Clear with good BS bilat with no rales, rhonchi, or wheezes  Heart: Auscultation: RRR.  Abdomen: Normoactive bowel sounds, soft, nontender, without masses or organomegaly palpable  Extremities:  BL LE: Trace  pretibial edema normal ROM and strength.  Skin: Hair: Texture and amount normal with gender appropriate distribution Skin Inspection: No rashes Skin Palpation: Skin temperature, texture, and thickness normal to palpation  Mental Status: Judgment, insight: Intact Orientation: Oriented to time, place, and person Memory: Intact for recent and remote events Mood and affect: No depression, anxiety, or agitation     DATA REVIEWED:   Results for ONI, DIETZMAN (MRN [id number]) as of 01/10/2021 10:22  Ref. Range 01/09/2021 15:23  TSH Latest Ref Range: 0.35 - 4.50 uIU/mL 2.68  T4,Free(Direct) Latest Ref Range: 0.60 - 1.60 ng/dL 1.97  (H)    ASSESSMENT/PLAN/RECOMMENDATIONS:   1. Hashimoto's Thyroiditis:  - Pt is S/P total thyroidectomy in 2006 due to benign reasons  - - Pt educated extensively on the correct way to take levothyroxine (first thing in the morning with water, 30 minutes before eating or taking other medications). - Pt encouraged to double dose the following day if she were to miss a dose given long half-life of levothyroxine. - She had questions about "raw thyroid hormone" . We discussed  my preference to go with Levothyroxine rather then armour thyroid, due to more stability with T4 content in levothyroxine and also armour thyroid has non-physiologic levels of T4:T3 of  4:1 (physiologic levels 13:1 to 16:1) - She also had questions about T3 but I explained this could increase her risk of cardiac arrhythmia, and I would like to avoid this. Her symptoms of fatigue is most likely multifactorial       Medications : Continue Levothyroxine 200 mcg daily    F/U in 3 months   Signed electronically by: Mack Guise, MD  [facility]  [address], [place] [address] Phone: [phone] FAX: [phone]   CC: Venia Carbon, MD [place] [address] Phone: [phone] Fax: [phone]   Return to Endocrinology clinic as below: Future Appointments  Date Time Provider [place]  01/11/2021 12:15 PM Venia Carbon, MD [id number]

## 2021-01-10 DIAGNOSIS — E063 Autoimmune thyroiditis: Secondary | ICD-10-CM | POA: Insufficient documentation

## 2021-01-11 ENCOUNTER — Other Ambulatory Visit: Payer: Self-pay

## 2021-01-11 ENCOUNTER — Ambulatory Visit (INDEPENDENT_AMBULATORY_CARE_PROVIDER_SITE_OTHER): Payer: Medicare Other | Admitting: Internal Medicine

## 2021-01-11 ENCOUNTER — Encounter: Payer: Self-pay | Admitting: Internal Medicine

## 2021-01-11 VITALS — BP 124/80 | HR 82 | Temp 96.6°F | Ht 64.0 in | Wt 249.0 lb

## 2021-01-11 DIAGNOSIS — I712 Thoracic aortic aneurysm, without rupture: Secondary | ICD-10-CM

## 2021-01-11 DIAGNOSIS — N1832 Chronic kidney disease, stage 3b: Secondary | ICD-10-CM | POA: Diagnosis not present

## 2021-01-11 DIAGNOSIS — Z1211 Encounter for screening for malignant neoplasm of colon: Secondary | ICD-10-CM | POA: Diagnosis not present

## 2021-01-11 DIAGNOSIS — F39 Unspecified mood [affective] disorder: Secondary | ICD-10-CM

## 2021-01-11 DIAGNOSIS — I10 Essential (primary) hypertension: Secondary | ICD-10-CM | POA: Diagnosis not present

## 2021-01-11 DIAGNOSIS — Z0000 Encounter for general adult medical examination without abnormal findings: Secondary | ICD-10-CM

## 2021-01-11 DIAGNOSIS — J9611 Chronic respiratory failure with hypoxia: Secondary | ICD-10-CM

## 2021-01-11 DIAGNOSIS — R7303 Prediabetes: Secondary | ICD-10-CM

## 2021-01-11 DIAGNOSIS — I7121 Aneurysm of the ascending aorta, without rupture: Secondary | ICD-10-CM

## 2021-01-11 DIAGNOSIS — J449 Chronic obstructive pulmonary disease, unspecified: Secondary | ICD-10-CM | POA: Diagnosis not present

## 2021-01-11 DIAGNOSIS — Z23 Encounter for immunization: Secondary | ICD-10-CM | POA: Diagnosis not present

## 2021-01-11 DIAGNOSIS — I700 Atherosclerosis of aorta: Secondary | ICD-10-CM | POA: Diagnosis not present

## 2021-01-11 LAB — RENAL FUNCTION PANEL
Albumin: 3.9 g/dL (ref 3.5–5.2)
BUN: 16 mg/dL (ref 6–23)
CO2: 28 mEq/L (ref 19–32)
Calcium: 9.9 mg/dL (ref 8.4–10.5)
Chloride: 105 mEq/L (ref 96–112)
Creatinine, Ser: 1.02 mg/dL (ref 0.40–1.20)
GFR: 54.98 mL/min — ABNORMAL LOW (ref >60.00)
Glucose, Bld: 90 mg/dL (ref 70–99)
Phosphorus: 3.8 mg/dL (ref 2.3–4.6)
Potassium: 3.6 mEq/L (ref 3.5–5.1)
Sodium: 140 mEq/L (ref 135–145)

## 2021-01-11 LAB — CBC
HCT: 51.8 % — ABNORMAL HIGH (ref 36.0–46.0)
Hemoglobin: 17.3 g/dL — ABNORMAL HIGH (ref 12.0–15.0)
MCHC: 33.4 g/dL (ref 30.0–36.0)
MCV: 94.2 fl (ref 78.0–100.0)
Platelets: 185 10*3/uL (ref 150.0–400.0)
RBC: 5.5 Mil/uL — ABNORMAL HIGH (ref 3.87–5.11)
RDW: 15.9 % — ABNORMAL HIGH (ref 11.5–15.5)
WBC: 9.5 10*3/uL (ref 4.0–10.5)

## 2021-01-11 LAB — HEPATIC FUNCTION PANEL
ALT: 24 U/L (ref 0–35)
AST: 28 U/L (ref 0–37)
Albumin: 3.9 g/dL (ref 3.5–5.2)
Alkaline Phosphatase: 69 U/L (ref 39–117)
Bilirubin, Direct: 0.2 mg/dL (ref 0.0–0.3)
Total Bilirubin: 0.7 mg/dL (ref 0.2–1.2)
Total Protein: 7.1 g/dL (ref 6.0–8.3)

## 2021-01-11 LAB — LIPID PANEL
Cholesterol: 162 mg/dL (ref 0–200)
HDL: 40 mg/dL (ref >39.00)
LDL Cholesterol: 87 mg/dL (ref 0–99)
NonHDL: 122.3
Total CHOL/HDL Ratio: 4
Triglycerides: 178 mg/dL — ABNORMAL HIGH (ref 0.0–149.0)
VLDL: 35.6 mg/dL (ref 0.0–40.0)

## 2021-01-11 LAB — HEMOGLOBIN A1C: Hgb A1c MFr Bld: 6.5 % (ref 4.6–6.5)

## 2021-01-11 MED ORDER — ROSUVASTATIN CALCIUM 10 MG PO TABS: 10.0000 mg | ORAL_TABLET | Freq: Every day | ORAL | 3 refills | Status: DC

## 2021-01-11 NOTE — Assessment & Plan Note (Signed)
Stable on recent CT scan Due for yearly Will start statin

## 2021-01-11 NOTE — Assessment & Plan Note (Signed)
Borderline on last test Will recheck

## 2021-01-11 NOTE — Assessment & Plan Note (Signed)
BP Readings from Last 3 Encounters:  01/11/21 124/80  01/09/21 130/80  07/06/20 126/80   Okay on HCTZ Will recheck labs

## 2021-01-11 NOTE — Patient Instructions (Signed)
Please set up your screening mammogram. 

## 2021-01-11 NOTE — Assessment & Plan Note (Signed)
BMI 42 Discussed efforts to have modest stable weight loss

## 2021-01-11 NOTE — Addendum Note (Signed)
Addended by: Pilar Grammes on: 01/11/2021 01:29 PM   Modules accepted: Orders

## 2021-01-11 NOTE — Assessment & Plan Note (Signed)
Uses the oxygen most of the time

## 2021-01-11 NOTE — Assessment & Plan Note (Signed)
Continues on symbicort and prn albuterol

## 2021-01-11 NOTE — Assessment & Plan Note (Signed)
I have personally reviewed the Medicare Annual Wellness questionnaire and have noted 1. The patient's medical and social history 2. Their use of alcohol, tobacco or illicit drugs 3. Their current medications and supplements 4. The patient's functional ability including ADL's, fall risks, home safety risks and hearing or visual             impairment. 5. Diet and physical activities 6. Evidence for depression or mood disorders  The patients weight, height, BMI and visual acuity have been recorded in the chart I have made referrals, counseling and provided education to the patient based review of the above and I have provided the pt with a written personalized care plan for preventive services.  I have provided you with a copy of your personalized plan for preventive services. Please take the time to review along with your updated medication list.  Overdue for colon--will make referral She will set up mammogram No pap due to age Not able to exercise--discussed chair exercise Going for COVID booster Flu vaccine today shingrix at pharmacy

## 2021-01-11 NOTE — Progress Notes (Signed)
Hearing Screening   125Hz  250Hz  500Hz  1000Hz  2000Hz  3000Hz  4000Hz  6000Hz  8000Hz   Right ear:           Left ear:           Comments: Portable Oxygen too loud to test hearing today  Vision Screening Comments: Appt 01-15-21

## 2021-01-11 NOTE — Progress Notes (Signed)
Subjective:    Patient ID: Kari Medina, female    DOB: 04-Jun-1946, 75 y.o.   MRN: [id number]  HPI Here for Medicare wellness visit and follow up of chronic health conditions With husband This visit occurred during the SARS-CoV-2 public health emergency.  Safety protocols were in place, including screening questions prior to the visit, additional usage of staff PPE, and extensive cleaning of exam room while observing appropriate contact time as indicated for disinfecting solutions.   Reviewed form and advanced directives Reviewed other doctors No alcohol or tobacco Still not able to exercise Vision is not great--macular dystrophy. Also with cataracts. Going to eye doctor next week Hearing is not great--trouble understanding words at church No falls Chronic mood problems Husband does most of the instrumental ADLs---not driving now (vision issues) No sig memory problems  Has had some increased breathing problems since 2 months ago Bronchitis--and never back to normal Was treated by Dr Donell Beers on the oxygen ---will take it off if just sitting around for time Not much cough Albuterol does help if she gets SOB  Wondering about her thyroid test variability Uses biotin mouth wash but not orally Reviewed her recent endocrine visit Notes her tremors are worse Didn't do well off the propranolol  Recent CT scan Known aortic aneurysm that is stable Aortic atherosclerosis also noted Gets funny feeling in chest at times--will take ASA then No dizziness or syncope No palpitations  Not checking sugars Taking the metformin daily  BMI stable--- 42 now Working on her eating  Last GFR 56  Mood is okay Continues on duloxetine "I love that medicine"  Current Outpatient Medications on File Prior to Visit  Medication Sig Dispense Refill  . acetaminophen (TYLENOL) 650 MG CR tablet Take 650 mg by mouth 3 (three) times daily.    Marland Kitchen albuterol (PROVENTIL HFA) 108 (90  Base) MCG/ACT inhaler Inhale 2 puffs into the lungs every 6 (six) hours as needed for wheezing or shortness of breath. 18 g 2  . aspirin EC 81 MG EC tablet Take 1 tablet (81 mg total) by mouth daily. 30 tablet 0  . b complex vitamins tablet Take 1 tablet by mouth daily.    . Bacillus Coagulans-Inulin (PROBIOTIC FORMULA PO) Take by mouth.    . budesonide-formoterol (SYMBICORT) 160-4.5 MCG/ACT inhaler Inhale 2 puffs into the lungs 2 (two) times daily.    . calcium carbonate (OS-CAL) 600 MG TABS Take 600 mg by mouth daily with breakfast.    . cholecalciferol (VITAMIN D) 1000 units tablet Take 2,000 Units by mouth 2 (two) times daily.    . DULoxetine (CYMBALTA) 60 MG capsule TAKE 1 CAPSULE(60 MG) BY MOUTH DAILY 90 capsule 3  . fluticasone (FLONASE) 50 MCG/ACT nasal spray Place 1 spray into both nostrils daily as needed. For stuffy nose  3  . glucose blood (ONETOUCH VERIO) test strip Use to check blood sugar once a day. E11.9 100 each 3  . hydrochlorothiazide (HYDRODIURIL) 12.5 MG tablet TAKE 1 TABLET BY MOUTH  DAILY 90 tablet 3  . levothyroxine (SYNTHROID) 200 MCG tablet TAKE 1 TABLET BY MOUTH  DAILY BEFORE BREAKFAST 90 tablet 3  . Magnesium 500 MG TABS Take 500 mg by mouth 2 (two) times daily.    . metFORMIN (GLUCOPHAGE-XR) 500 MG 24 hr tablet TAKE 1 TABLET BY MOUTH  DAILY WITH BREAKFAST 90 tablet 3  . montelukast (SINGULAIR) 10 MG tablet TAKE 1 TABLET BY MOUTH AT  BEDTIME 90 tablet 3  . ONETOUCH  DELICA LANCETS FINE MISC 1 each by Does not apply route daily. Use to check blood sugar once a day. E11.9 100 each 3  . OVER THE COUNTER MEDICATION EYE SUPPLEMENT...VISION ALIVE    . OVER THE COUNTER MEDICATION "Lung Supplement"    . potassium chloride (K-DUR) 10 MEQ tablet Take 1 tablet (10 mEq total) by mouth daily. 30 tablet 0  . propranolol (INDERAL) 40 MG tablet TAKE 1 TABLET BY MOUTH  TWICE DAILY 180 tablet 3  . rOPINIRole (REQUIP) 3 MG tablet TAKE ONE TABLET BY MOUTH AT BEDTIME 90 tablet 3  .  traMADol (ULTRAM) 50 MG tablet Take 0.5-1 tablets (25-50 mg total) by mouth 3 (three) times daily as needed. 60 tablet 0  . Turmeric Curcumin 500 MG CAPS Take 500 mg by mouth at bedtime.     . vitamin C (ASCORBIC ACID) 500 MG tablet Take 500 mg by mouth daily.     No current facility-administered medications on file prior to visit.    Allergies  Allergen Reactions  . Latex Hives  . Nickel Hives  . Pramipexole Other (See Comments)    Caused hallucinations, says he can take name brand.  . Prednisone     Makes her "feel crazy"  . Topiramate Other (See Comments)    "spaced out"  . Citalopram Anxiety  . Paroxetine Hcl Anxiety  . Sertraline Anxiety    Past Medical History:  Diagnosis Date  . Asthma   . Depression   . Diabetes mellitus without complication ([place])   . Dyspnea    doe  . Dysrhythmia    extra beat  . Fatty liver   . Fibromyalgia   . Generalized osteoarthritis of multiple sites   . History of hiatal hernia   . Hypertension   . Hypothyroidism   . Melanoma ([place]) 07/2018   Right leg  . OSA (obstructive sleep apnea)   . Pain    chronic ruq and back pain  . Panic attacks   . Raynaud disease   . RLS (restless legs syndrome)   . 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 PATIENT IS UNCERTAIN  . Tremor, essential     Past Surgical History:  Procedure Laterality Date  . CHOLECYSTECTOMY    . DILATATION & CURETTAGE/HYSTEROSCOPY WITH MYOSURE N/A 11/10/2018   Procedure: DILATATION & CURETTAGE/HYSTEROSCOPY WITH MYOSURE/MYOMECTOMY;  Surgeon: Aletha Halim, MD;  Location: [facility];  Service: Gynecology;  Laterality: N/A;  possible myosure.  Please use myosure scope, do not open myosure blades but have in the room  . JOINT REPLACEMENT Bilateral    2008/2011  . MELANOMA EXCISION  07/2018  . REVERSE SHOULDER ARTHROPLASTY Right 05/28/2017   Procedure: REVERSE SHOULDER ARTHROPLASTY;  Surgeon: Corky Mull, MD;  Location: [facility];  Service:  Orthopedics;  Laterality: Right;  . RHINOPLASTY  1972  . THYROIDECTOMY  2006  . TOTAL KNEE ARTHROPLASTY     bilateral    Family History  Problem Relation Age of Onset  . Osteoarthritis Mother   . Diabetes Mother   . Cirrhosis Mother   . Cancer Father   . Kidney cancer Father   . Bladder Cancer Father   . Heart disease Brother        stents in 1 brother  . Breast cancer Neg Hx     Social History   Socioeconomic History  . Marital status: Married    Spouse name: Not on file  . Number of children:  1  . Years of education: Not on file  . Highest education level: Not on file  Occupational History  . Occupation: Neurosurgeon    Comment: Retired  Tobacco Use  . Smoking status: Former Smoker    Quit date: 12/22/1988    Years since quitting: 32.0  . Smokeless tobacco: Never Used  Vaping Use  . Vaping Use: Never used  Substance and Sexual Activity  . Alcohol use: No  . Drug use: No  . Sexual activity: Not on file  Other Topics Concern  . Not on file  Social History Narrative   1 daughter      Has living will   Husband has health care POA. Alternate would be daughter Judson Roch   Would allow resuscitation but no prolonged machines   Not sure about feeding tubes   Social Determinants of Health   Financial Resource Strain: Not on file  Food Insecurity: Not on file  Transportation Needs: Not on file  Physical Activity: Not on file  Stress: Not on file  Social Connections: Not on file  Intimate Partner Violence: Not on file   Review of Systems Appetite is okay Weight up a few pounds Sleeps okay---uses CPAP every night Wears seat belt Full dentures Hasn't seen derm in about 2 years--is going soon (had past cancer) No heartburn but some dysphagia from dry mouth Bowels are fine--no blood Some back pain. Recent right elbow pain--did improve     Objective:   Physical Exam Constitutional:      Appearance: She is obese.  HENT:     Mouth/Throat:     Comments:  No lesions Eyes:     Conjunctiva/sclera: Conjunctivae normal.     Pupils: Pupils are equal, round, and reactive to light.  Cardiovascular:     Rate and Rhythm: Normal rate and regular rhythm.     Pulses: Normal pulses.     Heart sounds: No murmur heard. No gallop.   Pulmonary:     Effort: Pulmonary effort is normal.     Breath sounds: No wheezing or rales.     Comments: Decreased breath sounds but clear Abdominal:     Palpations: Abdomen is soft.     Tenderness: There is no abdominal tenderness.  Musculoskeletal:     Cervical back: Neck supple.     Right lower leg: No edema.     Left lower leg: No edema.  Lymphadenopathy:     Cervical: No cervical adenopathy.  Skin:    General: Skin is warm.     Findings: No rash.  Neurological:     Mental Status: She is alert and oriented to person, place, and time.     Comments: President--- "Edmon Crape, Trump, Obama" [phone] [id number] Recall 3/3  Psychiatric:        Mood and Affect: Mood normal.        Behavior: Behavior normal.            Assessment & Plan:

## 2021-01-11 NOTE — Assessment & Plan Note (Signed)
Seen on the CT scan Will start rosuvastatin

## 2021-01-11 NOTE — Assessment & Plan Note (Signed)
Okay on metformin Will recheck labs

## 2021-01-11 NOTE — Assessment & Plan Note (Signed)
Chronic dysthymia controlled with duloxetine

## 2021-01-15 DIAGNOSIS — E119 Type 2 diabetes mellitus without complications: Secondary | ICD-10-CM | POA: Diagnosis not present

## 2021-01-15 LAB — HM DIABETES EYE EXAM

## 2021-01-30 ENCOUNTER — Other Ambulatory Visit: Payer: Self-pay | Admitting: Internal Medicine

## 2021-02-20 DIAGNOSIS — E119 Type 2 diabetes mellitus without complications: Secondary | ICD-10-CM | POA: Diagnosis not present

## 2021-02-20 DIAGNOSIS — H2511 Age-related nuclear cataract, right eye: Secondary | ICD-10-CM | POA: Diagnosis not present

## 2021-02-25 ENCOUNTER — Other Ambulatory Visit: Payer: Self-pay

## 2021-02-25 ENCOUNTER — Encounter: Payer: Self-pay | Admitting: Ophthalmology

## 2021-02-26 ENCOUNTER — Encounter: Payer: Self-pay | Admitting: Ophthalmology

## 2021-02-28 ENCOUNTER — Other Ambulatory Visit: Payer: Self-pay

## 2021-02-28 ENCOUNTER — Other Ambulatory Visit
Admission: RE | Admit: 2021-02-28 | Discharge: 2021-02-28 | Disposition: A | Discharge status: Home / Self Care | Payer: Medicare Other | Source: Ambulatory Visit | Attending: Ophthalmology | Admitting: Ophthalmology

## 2021-02-28 DIAGNOSIS — Z01812 Encounter for preprocedural laboratory examination: Secondary | ICD-10-CM | POA: Insufficient documentation

## 2021-02-28 DIAGNOSIS — Z20822 Contact with and (suspected) exposure to covid-19: Secondary | ICD-10-CM | POA: Diagnosis not present

## 2021-02-28 LAB — SARS CORONAVIRUS 2 (TAT 6-24 HRS): SARS Coronavirus 2: NEGATIVE

## 2021-02-28 NOTE — Discharge Instructions (Signed)

## 2021-03-04 ENCOUNTER — Other Ambulatory Visit: Payer: Self-pay

## 2021-03-04 ENCOUNTER — Encounter: Payer: Self-pay | Admitting: Ophthalmology

## 2021-03-04 ENCOUNTER — Ambulatory Visit: Payer: Medicare Other | Admitting: Anesthesiology

## 2021-03-04 ENCOUNTER — Ambulatory Visit
Admission: RE | Admit: 2021-03-04 | Discharge: 2021-03-04 | Disposition: A | Discharge status: Home / Self Care | Payer: Medicare Other | Attending: Ophthalmology | Admitting: Ophthalmology

## 2021-03-04 ENCOUNTER — Encounter
Admission: RE | Disposition: A | Discharge status: Home / Self Care | Payer: Self-pay | Source: Home / Self Care | Attending: Ophthalmology

## 2021-03-04 DIAGNOSIS — Z79890 Hormone replacement therapy (postmenopausal): Secondary | ICD-10-CM | POA: Insufficient documentation

## 2021-03-04 DIAGNOSIS — Z79899 Other long term (current) drug therapy: Secondary | ICD-10-CM | POA: Insufficient documentation

## 2021-03-04 DIAGNOSIS — H25811 Combined forms of age-related cataract, right eye: Secondary | ICD-10-CM | POA: Diagnosis not present

## 2021-03-04 DIAGNOSIS — Z7982 Long term (current) use of aspirin: Secondary | ICD-10-CM | POA: Diagnosis not present

## 2021-03-04 DIAGNOSIS — Z91040 Latex allergy status: Secondary | ICD-10-CM | POA: Diagnosis not present

## 2021-03-04 DIAGNOSIS — Z87891 Personal history of nicotine dependence: Secondary | ICD-10-CM | POA: Diagnosis not present

## 2021-03-04 DIAGNOSIS — H2511 Age-related nuclear cataract, right eye: Secondary | ICD-10-CM | POA: Diagnosis not present

## 2021-03-04 DIAGNOSIS — E1136 Type 2 diabetes mellitus with diabetic cataract: Secondary | ICD-10-CM | POA: Diagnosis not present

## 2021-03-04 DIAGNOSIS — E1140 Type 2 diabetes mellitus with diabetic neuropathy, unspecified: Secondary | ICD-10-CM | POA: Insufficient documentation

## 2021-03-04 DIAGNOSIS — Z888 Allergy status to other drugs, medicaments and biological substances status: Secondary | ICD-10-CM | POA: Insufficient documentation

## 2021-03-04 DIAGNOSIS — Z7951 Long term (current) use of inhaled steroids: Secondary | ICD-10-CM | POA: Insufficient documentation

## 2021-03-04 DIAGNOSIS — Z7984 Long term (current) use of oral hypoglycemic drugs: Secondary | ICD-10-CM | POA: Insufficient documentation

## 2021-03-04 HISTORY — PX: CATARACT EXTRACTION W/PHACO: SHX586

## 2021-03-04 HISTORY — DX: Other specified postprocedural states: R11.2

## 2021-03-04 HISTORY — DX: Other specified postprocedural states: Z98.890

## 2021-03-04 HISTORY — DX: Chronic obstructive pulmonary disease, unspecified: J44.9

## 2021-03-04 HISTORY — DX: Chronic respiratory failure with hypoxia: J96.11

## 2021-03-04 HISTORY — DX: Presence of dental prosthetic device (complete) (partial): Z97.2

## 2021-03-04 LAB — GLUCOSE, CAPILLARY
Glucose-Capillary: 92 mg/dL (ref 70–99)
Glucose-Capillary: 80 mg/dL (ref 70–99)

## 2021-03-04 SURGERY — PHACOEMULSIFICATION, CATARACT, WITH IOL INSERTION
Anesthesia: Monitor Anesthesia Care | Site: Eye | Laterality: Right

## 2021-03-04 MED ORDER — ACETAMINOPHEN 325 MG PO TABS
325.0000 mg | ORAL_TABLET | ORAL | Status: DC | PRN
Start: 2021-03-04 — End: 2021-03-04

## 2021-03-04 MED ORDER — SODIUM HYALURONATE 23 MG/ML IO SOLN
INTRAOCULAR | Status: DC | PRN
  Administered 2021-03-04: 0.6 mL via INTRAOCULAR

## 2021-03-04 MED ORDER — TETRACAINE HCL 0.5 % OP SOLN
1.0000 [drp] | OPHTHALMIC | Status: DC | PRN
  Administered 2021-03-04 (×3): 1 [drp] via OPHTHALMIC

## 2021-03-04 MED ORDER — ACETAMINOPHEN 160 MG/5ML PO SOLN: 325.0000 mg | ORAL | Status: DC | PRN

## 2021-03-04 MED ORDER — ONDANSETRON HCL 4 MG/2ML IJ SOLN: 4.0000 mg | Freq: Once | INTRAMUSCULAR | Status: DC | PRN

## 2021-03-04 MED ORDER — SODIUM HYALURONATE 10 MG/ML IO SOLN
INTRAOCULAR | Status: DC | PRN
  Administered 2021-03-04: 0.55 mL via INTRAOCULAR

## 2021-03-04 MED ORDER — LIDOCAINE HCL (PF) 2 % IJ SOLN
INTRAOCULAR | Status: DC | PRN
  Administered 2021-03-04: 1 mL via INTRAOCULAR

## 2021-03-04 MED ORDER — MOXIFLOXACIN HCL 0.5 % OP SOLN
OPHTHALMIC | Status: DC | PRN
  Administered 2021-03-04: 0.2 mL via OPHTHALMIC

## 2021-03-04 MED ORDER — EPINEPHRINE PF 1 MG/ML IJ SOLN
INTRAOCULAR | Status: DC | PRN
  Administered 2021-03-04: 80 mL via OPHTHALMIC

## 2021-03-04 MED ORDER — ARMC OPHTHALMIC DILATING DROPS
1.0000 "application " | OPHTHALMIC | Status: DC | PRN
  Administered 2021-03-04 (×3): 1 via OPHTHALMIC

## 2021-03-04 MED ORDER — MIDAZOLAM HCL 2 MG/2ML IJ SOLN
INTRAMUSCULAR | Status: DC | PRN
  Administered 2021-03-04: 1 mg via INTRAVENOUS

## 2021-03-04 MED ORDER — FENTANYL CITRATE (PF) 100 MCG/2ML IJ SOLN
INTRAMUSCULAR | Status: DC | PRN
  Administered 2021-03-04: 50 ug via INTRAVENOUS

## 2021-03-04 SURGICAL SUPPLY — 17 items
CANNULA ANT/CHMB 27G (MISCELLANEOUS) ×2 IMPLANT
CANNULA ANT/CHMB 27GA (MISCELLANEOUS) ×4 IMPLANT
DISSECTOR HYDRO NUCLEUS 50X22 (MISCELLANEOUS) ×2 IMPLANT
GLOVE SURG SYN 8.5  E (GLOVE) ×2
GLOVE SURG SYN 8.5 E (GLOVE) ×1 IMPLANT
GLOVE SURG SYN 8.5 PF PI (GLOVE) ×1 IMPLANT
GOWN STRL REUS W/ TWL LRG LVL3 (GOWN DISPOSABLE) ×2 IMPLANT
GOWN STRL REUS W/TWL LRG LVL3 (GOWN DISPOSABLE) ×4
LENS IOL TECNIS EYHANCE 20.5 (Intraocular Lens) ×1 IMPLANT
MARKER SKIN DUAL TIP RULER LAB (MISCELLANEOUS) ×2 IMPLANT
PACK DR. KING ARMS (PACKS) ×2 IMPLANT
PACK EYE AFTER SURG (MISCELLANEOUS) ×2 IMPLANT
PACK OPTHALMIC (MISCELLANEOUS) ×2 IMPLANT
SYR 3ML LL SCALE MARK (SYRINGE) ×2 IMPLANT
SYR TB 1ML LUER SLIP (SYRINGE) ×2 IMPLANT
WATER STERILE IRR 250ML POUR (IV SOLUTION) ×2 IMPLANT
WIPE NON LINTING 3.25X3.25 (MISCELLANEOUS) ×2 IMPLANT

## 2021-03-04 NOTE — Anesthesia Preprocedure Evaluation (Addendum)
Anesthesia Evaluation  Patient identified by MRN, date of birth, ID band Patient awake    Reviewed: Allergy & Precautions, NPO status   History of Anesthesia Complications (+) PONV and AWARENESS UNDER ANESTHESIA  Airway Mallampati: II  TM Distance: >3 FB     Dental   Pulmonary shortness of breath, with exertion and Long-Term Oxygen Therapy, sleep apnea and Oxygen sleep apnea , COPD, former smoker,  Chronic hypoxemic respiratory failure - uses O2 at home with exertion and with CPAP   breath sounds clear to auscultation       Cardiovascular hypertension,  Rhythm:Regular Rate:Normal  Raynaud disease   Neuro/Psych Anxiety Depression    GI/Hepatic hiatal hernia,   Endo/Other  diabetes, Type 2Hypothyroidism   Renal/GU Renal disease (stage 3 CKD)     Musculoskeletal  (+) Arthritis , Fibromyalgia -  Abdominal   Peds  Hematology   Anesthesia Other Findings   Reproductive/Obstetrics                            Anesthesia Physical Anesthesia Plan  ASA: IV  Anesthesia Plan: MAC   Post-op Pain Management:    Induction: Intravenous  PONV Risk Score and Plan: TIVA, Midazolam and Treatment may vary due to age or medical condition  Airway Management Planned: Natural Airway and Nasal Cannula  Additional Equipment:   Intra-op Plan:   Post-operative Plan:   Informed Consent: I have reviewed the patients History and Physical, chart, labs and discussed the procedure including the risks, benefits and alternatives for the proposed anesthesia with the patient or authorized representative who has indicated his/her understanding and acceptance.       Plan Discussed with: CRNA  Anesthesia Plan Comments:        Anesthesia Quick Evaluation

## 2021-03-04 NOTE — Op Note (Signed)
OPERATIVE NOTE  Kari Medina [id number] 03/04/2021   PREOPERATIVE DIAGNOSIS:  Nuclear sclerotic cataract right eye.  H25.11   POSTOPERATIVE DIAGNOSIS:    Nuclear sclerotic cataract right eye.     PROCEDURE:  Phacoemusification with posterior chamber intraocular lens placement of the right eye   LENS:   Implant Name Type Inv. Item Serial No. Manufacturer Lot No. LRB No. Used Action  LENS IOL TECNIS EYHANCE 20.5 - [id number] Intraocular Lens LENS IOL TECNIS EYHANCE 20.5 [id number] JOHNSON   Right 1 Implanted       Procedure(s) with comments: CATARACT EXTRACTION PHACO AND INTRAOCULAR LENS PLACEMENT (IOC) RIGHT DIABETIC 3.98 00:33.2 (Right) - Diabetic - oral meds  DIB00 +20.5   ULTRASOUND TIME: 0 minutes 33 seconds.  CDE 3.98   SURGEON:  Benay Pillow, MD, MPH  ANESTHESIOLOGIST: Anesthesiologist: Veda Canning, MD CRNA: Cameron Ali, CRNA   ANESTHESIA:  Topical with tetracaine drops augmented with 1% preservative-free intracameral lidocaine.  ESTIMATED BLOOD LOSS: less than 1 mL.   COMPLICATIONS:  None.   DESCRIPTION OF PROCEDURE:  The patient was identified in the holding room and transported to the operating room and placed in the supine position under the operating microscope.  The right eye was identified as the operative eye and it was prepped and draped in the usual sterile ophthalmic fashion.   A 1.0 millimeter clear-corneal paracentesis was made at the 10:30 position. 0.5 ml of preservative-free 1% lidocaine with epinephrine was injected into the anterior chamber.  The anterior chamber was filled with Healon 5 viscoelastic.  A 2.4 millimeter keratome was used to make a near-clear corneal incision at the 8:00 position.  A curvilinear capsulorrhexis was made with a cystotome and capsulorrhexis forceps.  Balanced salt solution was used to hydrodissect and hydrodelineate the nucleus.   Phacoemulsification was then used in stop and chop fashion to remove the lens  nucleus and epinucleus.  The remaining cortex was then removed using the irrigation and aspiration handpiece. Healon was then placed into the capsular bag to distend it for lens placement.  A lens was then injected into the capsular bag.  The remaining viscoelastic was aspirated.   Wounds were hydrated with balanced salt solution.  The anterior chamber was inflated to a physiologic pressure with balanced salt solution.   Intracameral vigamox 0.1 mL undiluted was injected into the eye and a drop placed onto the ocular surface.  No wound leaks were noted.  The patient was taken to the recovery room in stable condition without complications of anesthesia or surgery  Benay Pillow 03/04/2021, 12:01 PM

## 2021-03-04 NOTE — Transfer of Care (Signed)
Immediate Anesthesia Transfer of Care Note  Patient: Kari Medina  Procedure(s) Performed: CATARACT EXTRACTION PHACO AND INTRAOCULAR LENS PLACEMENT (I[facility]) RIGHT DIABETIC 3.98 00:33.2 (Right Eye)  Patient Location: PACU  Anesthesia Type: MAC  Level of Consciousness: awake, alert  and patient cooperative  Airway and Oxygen Therapy: Patient Spontanous Breathing and Patient connected to supplemental oxygen  Post-op Assessment: Post-op Vital signs reviewed, Patient's Cardiovascular Status Stable, Respiratory Function Stable, Patent Airway and No signs of Nausea or vomiting  Post-op Vital Signs: Reviewed and stable  Complications: No complications documented.

## 2021-03-04 NOTE — Anesthesia Postprocedure Evaluation (Signed)
Anesthesia Post Note  Patient: Kari Medina  Procedure(s) Performed: CATARACT EXTRACTION PHACO AND INTRAOCULAR LENS PLACEMENT ([facility]) RIGHT DIABETIC 3.98 00:33.2 (Right Eye)     Patient location during evaluation: PACU Anesthesia Type: MAC Level of consciousness: awake Pain management: pain level controlled Vital Signs Assessment: post-procedure vital signs reviewed and stable Respiratory status: respiratory function stable Cardiovascular status: stable Postop Assessment: no apparent nausea or vomiting Anesthetic complications: no   No complications documented.  Veda Canning

## 2021-03-04 NOTE — H&P (Signed)
[facility]   Primary Care Physician:  Venia Carbon, MD Ophthalmologist: Dr. Benay Pillow  Pre-Procedure History & Physical: HPI:  Kari Medina is a 75 y.o. female here for cataract surgery.   Past Medical History:  Diagnosis Date  . Asthma   . Chronic hypoxemic respiratory failure (HCC)    uses O2 with exertion and with CPAP  . COPD (chronic obstructive pulmonary disease) ([place])   . Depression   . Diabetes mellitus without complication ([place])   . Dyspnea    doe  . Dysrhythmia    extra beat  . Fatty liver   . Fibromyalgia   . Generalized osteoarthritis of multiple sites   . History of hiatal hernia   . Hypertension   . Hypothyroidism   . Melanoma ([place]) 07/2018   Right leg  . OSA (obstructive sleep apnea)   . Pain    chronic ruq and back pain  . Panic attacks   . PONV (postoperative nausea and vomiting)    after thyroidectomy  . Raynaud disease   . RLS (restless legs syndrome)   . 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 PATIENT IS UNCERTAIN  . Tremor, essential   . Wears dentures    full upper and lower    Past Surgical History:  Procedure Laterality Date  . CHOLECYSTECTOMY    . DILATATION & CURETTAGE/HYSTEROSCOPY WITH MYOSURE N/A 11/10/2018   Procedure: DILATATION & CURETTAGE/HYSTEROSCOPY WITH MYOSURE/MYOMECTOMY;  Surgeon: Aletha Halim, MD;  Location: [place];  Service: Gynecology;  Laterality: N/A;  possible myosure.  Please use myosure scope, do not open myosure blades but have in the room  . JOINT REPLACEMENT Bilateral    2008/2011  . MELANOMA EXCISION  07/2018  . REVERSE SHOULDER ARTHROPLASTY Right 05/28/2017   Procedure: REVERSE SHOULDER ARTHROPLASTY;  Surgeon: Corky Mull, MD;  Location: [facility];  Service: Orthopedics;  Laterality: Right;  . RHINOPLASTY  1972  . THYROIDECTOMY  2006  . TOTAL KNEE ARTHROPLASTY     bilateral    Prior to Admission medications   Medication Sig Start Date End Date  Taking? Authorizing Provider  acetaminophen (TYLENOL) 650 MG CR tablet Take 650 mg by mouth 3 (three) times daily.   Yes [provider]  albuterol (PROVENTIL HFA) 108 (90 Base) MCG/ACT inhaler Inhale 2 puffs into the lungs every 6 (six) hours as needed for wheezing or shortness of breath. 01/06/20  Yes [name] I, MD  aspirin EC 81 MG EC tablet Take 1 tablet (81 mg total) by mouth daily. 10/26/18  Yes Vaughan Basta, MD  b complex vitamins tablet Take 1 tablet by mouth daily.   Yes [provider]  Bacillus Coagulans-Inulin (PROBIOTIC FORMULA PO) Take by mouth.   Yes [provider]  budesonide-formoterol (SYMBICORT) 160-4.5 MCG/ACT inhaler Inhale 2 puffs into the lungs 2 (two) times daily. 06/26/20  Yes [provider]  calcium carbonate (OS-CAL) 600 MG TABS Take 600 mg by mouth daily with breakfast.   Yes [provider]  CANNABIDIOL PO Take by mouth daily.   Yes [provider]  cholecalciferol (VITAMIN D) 1000 units tablet Take 2,000 Units by mouth 2 (two) times daily.   Yes [provider]  DULoxetine (CYMBALTA) 60 MG capsule TAKE 1 CAPSULE BY MOUTH  DAILY 01/30/21  Yes Venia Carbon, MD  fluticasone (FLONASE) 50 MCG/ACT nasal spray Place 1 spray into both nostrils daily as needed. For stuffy nose 04/02/17  Yes [provider]  hydrochlorothiazide (HYDRODIURIL) 12.5 MG tablet TAKE 1 TABLET BY MOUTH  DAILY 01/09/21  Yes Venia Carbon, MD  levothyroxine (SYNTHROID) 200 MCG tablet TAKE 1 TABLET BY MOUTH  DAILY BEFORE BREAKFAST 01/09/21  Yes Venia Carbon, MD  Magnesium 500 MG TABS Take 100 mg by mouth 2 (two) times daily.   Yes [provider]  metFORMIN (GLUCOPHAGE-XR) 500 MG 24 hr tablet TAKE 1 TABLET BY MOUTH  DAILY WITH BREAKFAST 01/09/21  Yes [name] I, MD  montelukast (SINGULAIR) 10 MG tablet TAKE 1 TABLET BY MOUTH AT  BEDTIME 01/09/21  Yes Venia Carbon, MD  OVER THE COUNTER  MEDICATION EYE SUPPLEMENT...VISION ALIVE   Yes [provider]  OVER THE COUNTER MEDICATION "Lung Supplement"   Yes [provider]  potassium chloride (K-DUR) 10 MEQ tablet Take 1 tablet (10 mEq total) by mouth daily. 10/25/18  Yes Vaughan Basta, MD  propranolol (INDERAL) 40 MG tablet TAKE 1 TABLET BY MOUTH  TWICE DAILY 01/09/21  Yes Venia Carbon, MD  rOPINIRole (REQUIP) 3 MG tablet TAKE ONE TABLET BY MOUTH AT BEDTIME Patient taking differently: 2 mg. 12/17/20  Yes Venia Carbon, MD  traMADol (ULTRAM) 50 MG tablet Take 0.5-1 tablets (25-50 mg total) by mouth 3 (three) times daily as needed. 09/12/19  Yes Venia Carbon, MD  Turmeric Curcumin 500 MG CAPS Take 500 mg by mouth at bedtime.    Yes [provider]  vitamin C (ASCORBIC ACID) 500 MG tablet Take 500 mg by mouth daily.   Yes [provider]  VITAMIN E PO Take by mouth.   Yes [provider]  glucose blood (ONETOUCH VERIO) test strip Use to check blood sugar once a day. E11.9 09/30/18   Venia Carbon, MD  [facility] DELICA LANCETS FINE MISC 1 each by Does not apply route daily. Use to check blood sugar once a day. E11.9 09/30/18   [name] I, MD  rosuvastatin (CRESTOR) 10 MG tablet Take 1 tablet (10 mg total) by mouth daily. Patient not taking: Reported on 02/25/2021 01/11/21   Venia Carbon, MD    Allergies as of 01/16/2021 - Review Complete 01/11/2021  Allergen Reaction Noted  . Latex Hives 04/16/2016  . Nickel Hives 04/16/2016  . Pramipexole Other (See Comments) 04/26/2015  . Prednisone  08/17/2014  . Topiramate Other (See Comments) 04/26/2015  . Citalopram Anxiety 05/05/2014  . Paroxetine hcl Anxiety 05/05/2014  . Sertraline Anxiety 05/05/2014    Family History  Problem Relation Age of Onset  . Osteoarthritis Mother   . Diabetes Mother   . Cirrhosis Mother   . Cancer Father   . Kidney cancer Father   . Bladder Cancer Father   . Heart disease  Brother        stents in 1 brother  . Breast cancer Neg Hx     Social History   Socioeconomic History  . Marital status: Married    Spouse name: Not on file  . Number of children: 1  . Years of education: Not on file  . Highest education level: Not on file  Occupational History  . Occupation: Neurosurgeon    Comment: Retired  Tobacco Use  . Smoking status: Former Smoker    Quit date: 12/22/1988    Years since quitting: 32.2  . Smokeless tobacco: Never Used  Vaping Use  . Vaping Use: Never used  Substance and Sexual Activity  . Alcohol use: No  .  Drug use: No  . Sexual activity: Not on file  Other Topics Concern  . Not on file  Social History Narrative   1 daughter      Has living will   Husband has health care POA. Alternate would be daughter Judson Roch   Would allow resuscitation but no prolonged machines   Not sure about feeding tubes   Social Determinants of Health   Financial Resource Strain: Not on file  Food Insecurity: Not on file  Transportation Needs: Not on file  Physical Activity: Not on file  Stress: Not on file  Social Connections: Not on file  Intimate Partner Violence: Not on file    Review of Systems: See HPI, otherwise negative ROS  Physical Exam: BP (!) 157/81   Pulse 69   Temp 97.6 F (36.4 C) (Temporal)   Resp 18   Ht 5\' 8"  (1.727 m)   Wt 110.2 kg   SpO2 99%   BMI 36.95 kg/m  General:   Alert,  pleasant and cooperative in NAD Head:  Normocephalic and atraumatic. Respiratory:  Normal work of breathing.  Impression/Plan: Kari Medina is here for cataract surgery.  Risks, benefits, limitations, and alternatives regarding cataract surgery have been reviewed with the patient.  Questions have been answered.  All parties agreeable.   Benay Pillow, MD  03/04/2021, 11:27 AM

## 2021-03-06 ENCOUNTER — Encounter: Payer: Self-pay | Admitting: Ophthalmology

## 2021-03-11 ENCOUNTER — Encounter: Payer: Self-pay | Admitting: Ophthalmology

## 2021-03-14 ENCOUNTER — Other Ambulatory Visit
Admission: RE | Admit: 2021-03-14 | Discharge: 2021-03-14 | Disposition: A | Discharge status: Home / Self Care | Payer: Medicare Other | Source: Ambulatory Visit | Attending: Ophthalmology | Admitting: Ophthalmology

## 2021-03-14 ENCOUNTER — Other Ambulatory Visit: Payer: Self-pay

## 2021-03-14 DIAGNOSIS — Z01812 Encounter for preprocedural laboratory examination: Secondary | ICD-10-CM | POA: Insufficient documentation

## 2021-03-14 DIAGNOSIS — H2512 Age-related nuclear cataract, left eye: Secondary | ICD-10-CM | POA: Diagnosis not present

## 2021-03-14 DIAGNOSIS — Z20822 Contact with and (suspected) exposure to covid-19: Secondary | ICD-10-CM | POA: Insufficient documentation

## 2021-03-14 LAB — SARS CORONAVIRUS 2 (TAT 6-24 HRS): SARS Coronavirus 2: NEGATIVE

## 2021-03-14 NOTE — Discharge Instructions (Signed)

## 2021-03-18 ENCOUNTER — Encounter
Admission: RE | Disposition: A | Discharge status: Home / Self Care | Payer: Self-pay | Source: Home / Self Care | Attending: Ophthalmology

## 2021-03-18 ENCOUNTER — Ambulatory Visit: Payer: Medicare Other | Admitting: Anesthesiology

## 2021-03-18 ENCOUNTER — Ambulatory Visit
Admission: RE | Admit: 2021-03-18 | Discharge: 2021-03-18 | Disposition: A | Discharge status: Home / Self Care | Payer: Medicare Other | Attending: Ophthalmology | Admitting: Ophthalmology

## 2021-03-18 ENCOUNTER — Other Ambulatory Visit: Payer: Self-pay

## 2021-03-18 ENCOUNTER — Encounter: Payer: Self-pay | Admitting: Ophthalmology

## 2021-03-18 DIAGNOSIS — Z7984 Long term (current) use of oral hypoglycemic drugs: Secondary | ICD-10-CM | POA: Insufficient documentation

## 2021-03-18 DIAGNOSIS — Z833 Family history of diabetes mellitus: Secondary | ICD-10-CM | POA: Insufficient documentation

## 2021-03-18 DIAGNOSIS — Z7982 Long term (current) use of aspirin: Secondary | ICD-10-CM | POA: Diagnosis not present

## 2021-03-18 DIAGNOSIS — E1136 Type 2 diabetes mellitus with diabetic cataract: Secondary | ICD-10-CM | POA: Diagnosis not present

## 2021-03-18 DIAGNOSIS — Z7951 Long term (current) use of inhaled steroids: Secondary | ICD-10-CM | POA: Diagnosis not present

## 2021-03-18 DIAGNOSIS — Z87891 Personal history of nicotine dependence: Secondary | ICD-10-CM | POA: Insufficient documentation

## 2021-03-18 DIAGNOSIS — Z91040 Latex allergy status: Secondary | ICD-10-CM | POA: Diagnosis not present

## 2021-03-18 DIAGNOSIS — J449 Chronic obstructive pulmonary disease, unspecified: Secondary | ICD-10-CM | POA: Diagnosis not present

## 2021-03-18 DIAGNOSIS — R251 Tremor, unspecified: Secondary | ICD-10-CM | POA: Diagnosis not present

## 2021-03-18 DIAGNOSIS — I10 Essential (primary) hypertension: Secondary | ICD-10-CM | POA: Insufficient documentation

## 2021-03-18 DIAGNOSIS — Z809 Family history of malignant neoplasm, unspecified: Secondary | ICD-10-CM | POA: Insufficient documentation

## 2021-03-18 DIAGNOSIS — Z8051 Family history of malignant neoplasm of kidney: Secondary | ICD-10-CM | POA: Diagnosis not present

## 2021-03-18 DIAGNOSIS — G4733 Obstructive sleep apnea (adult) (pediatric): Secondary | ICD-10-CM | POA: Diagnosis not present

## 2021-03-18 DIAGNOSIS — Z8052 Family history of malignant neoplasm of bladder: Secondary | ICD-10-CM | POA: Insufficient documentation

## 2021-03-18 DIAGNOSIS — Z888 Allergy status to other drugs, medicaments and biological substances status: Secondary | ICD-10-CM | POA: Diagnosis not present

## 2021-03-18 DIAGNOSIS — E890 Postprocedural hypothyroidism: Secondary | ICD-10-CM | POA: Insufficient documentation

## 2021-03-18 DIAGNOSIS — Z96653 Presence of artificial knee joint, bilateral: Secondary | ICD-10-CM | POA: Diagnosis not present

## 2021-03-18 DIAGNOSIS — Z9049 Acquired absence of other specified parts of digestive tract: Secondary | ICD-10-CM | POA: Diagnosis not present

## 2021-03-18 DIAGNOSIS — Z79890 Hormone replacement therapy (postmenopausal): Secondary | ICD-10-CM | POA: Diagnosis not present

## 2021-03-18 DIAGNOSIS — Z9081 Acquired absence of spleen: Secondary | ICD-10-CM | POA: Diagnosis not present

## 2021-03-18 DIAGNOSIS — Z79899 Other long term (current) drug therapy: Secondary | ICD-10-CM | POA: Insufficient documentation

## 2021-03-18 DIAGNOSIS — H2512 Age-related nuclear cataract, left eye: Secondary | ICD-10-CM | POA: Diagnosis not present

## 2021-03-18 DIAGNOSIS — H25812 Combined forms of age-related cataract, left eye: Secondary | ICD-10-CM | POA: Diagnosis not present

## 2021-03-18 DIAGNOSIS — Z85820 Personal history of malignant melanoma of skin: Secondary | ICD-10-CM | POA: Diagnosis not present

## 2021-03-18 DIAGNOSIS — Z8249 Family history of ischemic heart disease and other diseases of the circulatory system: Secondary | ICD-10-CM | POA: Diagnosis not present

## 2021-03-18 HISTORY — PX: CATARACT EXTRACTION W/PHACO: SHX586

## 2021-03-18 LAB — GLUCOSE, CAPILLARY: Glucose-Capillary: 88 mg/dL (ref 70–99)

## 2021-03-18 SURGERY — PHACOEMULSIFICATION, CATARACT, WITH IOL INSERTION
Anesthesia: Monitor Anesthesia Care | Site: Eye | Laterality: Left

## 2021-03-18 MED ORDER — TETRACAINE HCL 0.5 % OP SOLN
1.0000 [drp] | OPHTHALMIC | Status: DC | PRN
  Administered 2021-03-18 (×3): 1 [drp] via OPHTHALMIC

## 2021-03-18 MED ORDER — LIDOCAINE HCL (PF) 2 % IJ SOLN
INTRAOCULAR | Status: DC | PRN
  Administered 2021-03-18: 1 mL via INTRAOCULAR

## 2021-03-18 MED ORDER — EPINEPHRINE PF 1 MG/ML IJ SOLN
INTRAOCULAR | Status: DC | PRN
  Administered 2021-03-18: 67 mL via OPHTHALMIC

## 2021-03-18 MED ORDER — FENTANYL CITRATE (PF) 100 MCG/2ML IJ SOLN
INTRAMUSCULAR | Status: DC | PRN
  Administered 2021-03-18: 50 ug via INTRAVENOUS

## 2021-03-18 MED ORDER — ARMC OPHTHALMIC DILATING DROPS
1.0000 "application " | OPHTHALMIC | Status: DC | PRN
  Administered 2021-03-18 (×3): 1 via OPHTHALMIC

## 2021-03-18 MED ORDER — MOXIFLOXACIN HCL 0.5 % OP SOLN
OPHTHALMIC | Status: DC | PRN
  Administered 2021-03-18: 0.2 mL via OPHTHALMIC

## 2021-03-18 MED ORDER — LACTATED RINGERS IV SOLN: INTRAVENOUS | Status: DC

## 2021-03-18 MED ORDER — SODIUM HYALURONATE 23 MG/ML IO SOLN
INTRAOCULAR | Status: DC | PRN
  Administered 2021-03-18: 0.6 mL via INTRAOCULAR

## 2021-03-18 MED ORDER — ONDANSETRON HCL 4 MG/2ML IJ SOLN: 4.0000 mg | Freq: Once | INTRAMUSCULAR | Status: DC | PRN

## 2021-03-18 MED ORDER — ACETAMINOPHEN 160 MG/5ML PO SOLN
325.0000 mg | ORAL | Status: DC | PRN
Start: 2021-03-18 — End: 2021-03-18

## 2021-03-18 MED ORDER — MIDAZOLAM HCL 2 MG/2ML IJ SOLN
INTRAMUSCULAR | Status: DC | PRN
  Administered 2021-03-18: 1 mg via INTRAVENOUS

## 2021-03-18 MED ORDER — ACETAMINOPHEN 325 MG PO TABS: 325.0000 mg | ORAL_TABLET | ORAL | Status: DC | PRN

## 2021-03-18 MED ORDER — SODIUM HYALURONATE 10 MG/ML IO SOLN
INTRAOCULAR | Status: DC | PRN
  Administered 2021-03-18: 0.55 mL via INTRAOCULAR

## 2021-03-18 SURGICAL SUPPLY — 17 items
CANNULA ANT/CHMB 27G (MISCELLANEOUS) ×2 IMPLANT
CANNULA ANT/CHMB 27GA (MISCELLANEOUS) ×4 IMPLANT
DISSECTOR HYDRO NUCLEUS 50X22 (MISCELLANEOUS) ×2 IMPLANT
GLOVE SURG SYN 8.5  E (GLOVE) ×6
GLOVE SURG SYN 8.5 E (GLOVE) ×3 IMPLANT
GLOVE SURG SYN 8.5 PF PI (GLOVE) ×1 IMPLANT
GOWN STRL REUS W/ TWL LRG LVL3 (GOWN DISPOSABLE) ×2 IMPLANT
GOWN STRL REUS W/TWL LRG LVL3 (GOWN DISPOSABLE) ×4
LENS IOL TECNIS EYHANCE 21.0 (Intraocular Lens) ×1 IMPLANT
MARKER SKIN DUAL TIP RULER LAB (MISCELLANEOUS) ×2 IMPLANT
PACK DR. KING ARMS (PACKS) ×2 IMPLANT
PACK EYE AFTER SURG (MISCELLANEOUS) ×2 IMPLANT
PACK OPTHALMIC (MISCELLANEOUS) ×2 IMPLANT
SYR 3ML LL SCALE MARK (SYRINGE) ×2 IMPLANT
SYR TB 1ML LUER SLIP (SYRINGE) ×2 IMPLANT
WATER STERILE IRR 250ML POUR (IV SOLUTION) ×2 IMPLANT
WIPE NON LINTING 3.25X3.25 (MISCELLANEOUS) ×2 IMPLANT

## 2021-03-18 NOTE — Op Note (Signed)
OPERATIVE NOTE  Kari Medina [id number] 03/18/2021   PREOPERATIVE DIAGNOSIS:  Nuclear sclerotic cataract left eye.  H25.12   POSTOPERATIVE DIAGNOSIS:    Nuclear sclerotic cataract left eye.     PROCEDURE:  Phacoemusification with posterior chamber intraocular lens placement of the left eye   LENS:   Implant Name Type Inv. Item Serial No. Manufacturer Lot No. LRB No. Used Action  LENS IOL TECNIS EYHANCE 21.0 - [id number] Intraocular Lens LENS IOL TECNIS EYHANCE 21.0 [id number] JOHNSON   Left 1 Implanted      Procedure(s) with comments: CATARACT EXTRACTION PHACO AND INTRAOCULAR LENS PLACEMENT (IOC) LEFT (Left) - 2.96 0:27.9  DIB00 +21.0   ULTRASOUND TIME: 0 minutes 27 seconds.  CDE 2.96   SURGEON:  Benay Pillow, MD, MPH   ANESTHESIA:  Topical with tetracaine drops augmented with 1% preservative-free intracameral lidocaine.  ESTIMATED BLOOD LOSS: <1 mL   COMPLICATIONS:  None.   DESCRIPTION OF PROCEDURE:  The patient was identified in the holding room and transported to the operating room and placed in the supine position under the operating microscope.  The left eye was identified as the operative eye and it was prepped and draped in the usual sterile ophthalmic fashion.   A 1.0 millimeter clear-corneal paracentesis was made at the 5:00 position. 0.5 ml of preservative-free 1% lidocaine with epinephrine was injected into the anterior chamber.  The anterior chamber was filled with Healon 5 viscoelastic.  A 2.4 millimeter keratome was used to make a near-clear corneal incision at the 2:00 position.  A curvilinear capsulorrhexis was made with a cystotome and capsulorrhexis forceps.  Balanced salt solution was used to hydrodissect and hydrodelineate the nucleus.   Phacoemulsification was then used in stop and chop fashion to remove the lens nucleus and epinucleus.  The remaining cortex was then removed using the irrigation and aspiration handpiece. Healon was then placed into  the capsular bag to distend it for lens placement.  A lens was then injected into the capsular bag.  The remaining viscoelastic was aspirated.   Wounds were hydrated with balanced salt solution.  The anterior chamber was inflated to a physiologic pressure with balanced salt solution.  Intracameral vigamox 0.1 mL undiltued was injected into the eye and a drop placed onto the ocular surface.  No wound leaks were noted.  The patient was taken to the recovery room in stable condition without complications of anesthesia or surgery  Benay Pillow 03/18/2021, 12:21 PM

## 2021-03-18 NOTE — Transfer of Care (Signed)
Immediate Anesthesia Transfer of Care Note  Patient: Kari Medina  Procedure(s) Performed: CATARACT EXTRACTION PHACO AND INTRAOCULAR LENS PLACEMENT ([place]) LEFT (Left Eye)  Patient Location: PACU  Anesthesia Type: MAC  Level of Consciousness: awake, alert  and patient cooperative  Airway and Oxygen Therapy: Patient Spontanous Breathing and Patient connected to supplemental oxygen  Post-op Assessment: Post-op Vital signs reviewed, Patient's Cardiovascular Status Stable, Respiratory Function Stable, Patent Airway and No signs of Nausea or vomiting  Post-op Vital Signs: Reviewed and stable  Complications: No complications documented.

## 2021-03-18 NOTE — Anesthesia Procedure Notes (Signed)
Procedure Name: MAC Date/Time: 03/18/2021 12:09 PM Performed by: Cameron Ali, CRNA Pre-anesthesia Checklist: Patient identified, Emergency Drugs available, Suction available, Timeout performed and Patient being monitored Patient Re-evaluated:Patient Re-evaluated prior to induction Oxygen Delivery Method: Nasal cannula Placement Confirmation: positive ETCO2

## 2021-03-18 NOTE — H&P (Signed)
[facility]   Primary Care Physician:  Venia Carbon, MD Ophthalmologist: Dr. Benay Pillow  Pre-Procedure History & Physical: HPI:  Kari Medina is a 75 y.o. female here for cataract surgery.   Past Medical History:  Diagnosis Date  . Asthma   . Chronic hypoxemic respiratory failure (HCC)    uses O2 with exertion and with CPAP  . COPD (chronic obstructive pulmonary disease) ([place])   . Depression   . Diabetes mellitus without complication ([place])   . Dyspnea    doe  . Dysrhythmia    extra beat  . Fatty liver   . Fibromyalgia   . Generalized osteoarthritis of multiple sites   . History of hiatal hernia   . Hypertension   . Hypothyroidism   . Melanoma ([place]) 07/2018   Right leg  . OSA (obstructive sleep apnea)   . Pain    chronic ruq and back pain  . Panic attacks   . PONV (postoperative nausea and vomiting)    after thyroidectomy  . Raynaud disease   . RLS (restless legs syndrome)   . 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 PATIENT IS UNCERTAIN  . Tremor, essential   . Wears dentures    full upper and lower    Past Surgical History:  Procedure Laterality Date  . CATARACT EXTRACTION W/PHACO Right 03/04/2021   Procedure: CATARACT EXTRACTION PHACO AND INTRAOCULAR LENS PLACEMENT (IOC) RIGHT DIABETIC 3.98 00:33.2;  Surgeon: Eulogio Bear, MD;  Location: [place];  Service: Ophthalmology;  Laterality: Right;  Diabetic - oral meds  . CHOLECYSTECTOMY    . DILATATION & CURETTAGE/HYSTEROSCOPY WITH MYOSURE N/A 11/10/2018   Procedure: DILATATION & CURETTAGE/HYSTEROSCOPY WITH MYOSURE/MYOMECTOMY;  Surgeon: Aletha Halim, MD;  Location: [place];  Service: Gynecology;  Laterality: N/A;  possible myosure.  Please use myosure scope, do not open myosure blades but have in the room  . JOINT REPLACEMENT Bilateral    2008/2011  . MELANOMA EXCISION  07/2018  . REVERSE SHOULDER ARTHROPLASTY Right 05/28/2017   Procedure:  REVERSE SHOULDER ARTHROPLASTY;  Surgeon: Corky Mull, MD;  Location: [facility];  Service: Orthopedics;  Laterality: Right;  . RHINOPLASTY  1972  . THYROIDECTOMY  2006  . TOTAL KNEE ARTHROPLASTY     bilateral    Prior to Admission medications   Medication Sig Start Date End Date Taking? Authorizing Provider  acetaminophen (TYLENOL) 650 MG CR tablet Take 650 mg by mouth 3 (three) times daily.   Yes [provider]  albuterol (PROVENTIL HFA) 108 (90 Base) MCG/ACT inhaler Inhale 2 puffs into the lungs every 6 (six) hours as needed for wheezing or shortness of breath. 01/06/20  Yes [name] I, MD  aspirin EC 81 MG EC tablet Take 1 tablet (81 mg total) by mouth daily. 10/26/18  Yes Vaughan Basta, MD  b complex vitamins tablet Take 1 tablet by mouth daily.   Yes [provider]  Bacillus Coagulans-Inulin (PROBIOTIC FORMULA PO) Take by mouth.   Yes [provider]  budesonide-formoterol (SYMBICORT) 160-4.5 MCG/ACT inhaler Inhale 2 puffs into the lungs 2 (two) times daily. 06/26/20  Yes [provider]  calcium carbonate (OS-CAL) 600 MG TABS Take 600 mg by mouth daily with breakfast.   Yes [provider]  CANNABIDIOL PO Take by mouth daily.   Yes [provider]  cholecalciferol (VITAMIN D) 1000 units tablet Take 2,000 Units by mouth 2 (two) times daily.  Yes [provider]  DULoxetine (CYMBALTA) 60 MG capsule TAKE 1 CAPSULE BY MOUTH  DAILY 01/30/21  Yes Venia Carbon, MD  fluticasone (FLONASE) 50 MCG/ACT nasal spray Place 1 spray into both nostrils daily as needed. For stuffy nose 04/02/17  Yes [provider]  hydrochlorothiazide (HYDRODIURIL) 12.5 MG tablet TAKE 1 TABLET BY MOUTH  DAILY 01/09/21  Yes [name] I, MD  Magnesium 500 MG TABS Take 100 mg by mouth 2 (two) times daily.   Yes [provider]  metFORMIN (GLUCOPHAGE-XR) 500 MG 24 hr tablet TAKE 1 TABLET BY MOUTH  DAILY WITH BREAKFAST 01/09/21   Yes [name] I, MD  montelukast (SINGULAIR) 10 MG tablet TAKE 1 TABLET BY MOUTH AT  BEDTIME 01/09/21  Yes Venia Carbon, MD  OVER THE COUNTER MEDICATION EYE SUPPLEMENT...VISION ALIVE   Yes [provider]  OVER THE COUNTER MEDICATION "Lung Supplement"   Yes [provider]  potassium chloride (K-DUR) 10 MEQ tablet Take 1 tablet (10 mEq total) by mouth daily. 10/25/18  Yes Vaughan Basta, MD  propranolol (INDERAL) 40 MG tablet TAKE 1 TABLET BY MOUTH  TWICE DAILY 01/09/21  Yes Venia Carbon, MD  rOPINIRole (REQUIP) 3 MG tablet TAKE ONE TABLET BY MOUTH AT BEDTIME Patient taking differently: 2 mg. 12/17/20  Yes Venia Carbon, MD  traMADol (ULTRAM) 50 MG tablet Take 0.5-1 tablets (25-50 mg total) by mouth 3 (three) times daily as needed. 09/12/19  Yes Venia Carbon, MD  Turmeric Curcumin 500 MG CAPS Take 500 mg by mouth at bedtime.    Yes [provider]  vitamin C (ASCORBIC ACID) 500 MG tablet Take 500 mg by mouth daily.   Yes [provider]  VITAMIN E PO Take by mouth.   Yes [provider]  glucose blood (ONETOUCH VERIO) test strip Use to check blood sugar once a day. E11.9 09/30/18   Venia Carbon, MD  levothyroxine (SYNTHROID) 200 MCG tablet TAKE 1 TABLET BY MOUTH  DAILY BEFORE BREAKFAST 01/09/21   Venia Carbon, MD  [facility] DELICA LANCETS FINE MISC 1 each by Does not apply route daily. Use to check blood sugar once a day. E11.9 09/30/18   [name] I, MD  rosuvastatin (CRESTOR) 10 MG tablet Take 1 tablet (10 mg total) by mouth daily. Patient not taking: Reported on 02/25/2021 01/11/21   Venia Carbon, MD    Allergies as of 01/16/2021 - Review Complete 01/11/2021  Allergen Reaction Noted  . Latex Hives 04/16/2016  . Nickel Hives 04/16/2016  . Pramipexole Other (See Comments) 04/26/2015  . Prednisone  08/17/2014  . Topiramate Other (See Comments) 04/26/2015  . Citalopram Anxiety 05/05/2014  . Paroxetine  hcl Anxiety 05/05/2014  . Sertraline Anxiety 05/05/2014    Family History  Problem Relation Age of Onset  . Osteoarthritis Mother   . Diabetes Mother   . Cirrhosis Mother   . Cancer Father   . Kidney cancer Father   . Bladder Cancer Father   . Heart disease Brother        stents in 1 brother  . Breast cancer Neg Hx     Social History   Socioeconomic History  . Marital status: Married    Spouse name: Not on file  . Number of children: 1  . Years of education: Not on file  . Highest education level: Not on file  Occupational History  . Occupation: Neurosurgeon    Comment: Retired  Tobacco  Use  . Smoking status: Former Smoker    Quit date: 12/22/1988    Years since quitting: 32.2  . Smokeless tobacco: Never Used  Vaping Use  . Vaping Use: Never used  Substance and Sexual Activity  . Alcohol use: No  . Drug use: No  . Sexual activity: Not on file  Other Topics Concern  . Not on file  Social History Narrative   1 daughter      Has living will   Husband has health care POA. Alternate would be daughter Judson Roch   Would allow resuscitation but no prolonged machines   Not sure about feeding tubes   Social Determinants of Health   Financial Resource Strain: Not on file  Food Insecurity: Not on file  Transportation Needs: Not on file  Physical Activity: Not on file  Stress: Not on file  Social Connections: Not on file  Intimate Partner Violence: Not on file    Review of Systems: See HPI, otherwise negative ROS  Physical Exam: BP (!) 175/106   Pulse 75   Temp (!) 97.5 F (36.4 C)   Ht 5\' 6"  (1.676 m)   Wt 108.9 kg   SpO2 98%   BMI 38.74 kg/m  General:   Alert,  pleasant and cooperative in NAD Head:  Normocephalic and atraumatic. Respiratory:  Normal work of breathing. Cardiovascular:  RRR  Impression/Plan: Kari Medina is here for cataract surgery.  Risks, benefits, limitations, and alternatives regarding cataract surgery have been  reviewed with the patient.  Questions have been answered.  All parties agreeable.   Benay Pillow, MD  03/18/2021, 11:44 AM

## 2021-03-18 NOTE — Anesthesia Postprocedure Evaluation (Signed)
Anesthesia Post Note  Patient: Kari Medina  Procedure(s) Performed: CATARACT EXTRACTION PHACO AND INTRAOCULAR LENS PLACEMENT ([place]) LEFT (Left Eye)     Patient location during evaluation: PACU Anesthesia Type: MAC Level of consciousness: awake and alert Pain management: pain level controlled Vital Signs Assessment: post-procedure vital signs reviewed and stable Respiratory status: spontaneous breathing, nonlabored ventilation, respiratory function stable and patient connected to nasal cannula oxygen Cardiovascular status: stable and blood pressure returned to baseline Postop Assessment: no apparent nausea or vomiting Anesthetic complications: no   No complications documented.  Sinda Du

## 2021-03-18 NOTE — Anesthesia Preprocedure Evaluation (Signed)
Anesthesia Evaluation  Patient identified by MRN, date of birth, ID band Patient awake    Reviewed: Allergy & Precautions, NPO status   History of Anesthesia Complications (+) PONV, AWARENESS UNDER ANESTHESIA and history of anesthetic complications  Airway Mallampati: II  TM Distance: >3 FB     Dental no notable dental hx.    Pulmonary shortness of breath, with exertion and Long-Term Oxygen Therapy, asthma , sleep apnea and Oxygen sleep apnea , COPD, former smoker,  Chronic hypoxemic respiratory failure - uses O2 at home with exertion and with CPAP  On 4L [place] due to "brisk walk this am"   + decreased breath sounds+ wheezing      Cardiovascular Exercise Tolerance: Good hypertension, + dysrhythmias  Rhythm:Regular Rate:Normal  Raynaud disease   Neuro/Psych PSYCHIATRIC DISORDERS Anxiety Depression  Neuromuscular disease    GI/Hepatic hiatal hernia,   Endo/Other  diabetes, Type 2Hypothyroidism   Renal/GU Renal disease (stage 3 CKD)     Musculoskeletal  (+) Arthritis , Fibromyalgia -  Abdominal (+) + obese,  Abdomen: soft.    Peds  Hematology   Anesthesia Other Findings   Reproductive/Obstetrics                             Anesthesia Physical  Anesthesia Plan  ASA: IV  Anesthesia Plan: MAC   Post-op Pain Management:    Induction: Intravenous  PONV Risk Score and Plan: 3 and TIVA, Midazolam and Treatment may vary due to age or medical condition  Airway Management Planned: Natural Airway and Nasal Cannula  Additional Equipment:   Intra-op Plan:   Post-operative Plan:   Informed Consent: I have reviewed the patients History and Physical, chart, labs and discussed the procedure including the risks, benefits and alternatives for the proposed anesthesia with the patient or authorized representative who has indicated his/her understanding and acceptance.     Dental advisory  given  Plan Discussed with: CRNA and Anesthesiologist  Anesthesia Plan Comments:         Anesthesia Quick Evaluation  Patient Active Problem List   Diagnosis Date Noted  . Aortic atherosclerosis ([place]) 01/11/2021  . Stage 3b chronic kidney disease ([place]) 01/11/2021  . Hashimoto's thyroiditis 01/10/2021  . Hypothyroidism 07/06/2020  . Chronic hypoxemic respiratory failure ([place]) 07/06/2020  . Prediabetes 09/06/2018  . History of melanoma 07/22/2018  . Thoracic ascending aortic aneurysm ([place]) 04/07/2018  . Mood disorder ([place]) 03/02/2018  . Status post total bilateral knee replacement 12/03/2017  . Preventative health care 08/20/2017  . Urinary incontinence 08/20/2017  . Advance directive discussed with patient 08/20/2017  . 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 Morbid obesity ([place]) 05/22/2016  . COPD (chronic obstructive pulmonary disease) ([place]) 04/16/2016  . Hypertension   . Tremor, essential   . OSA (obstructive sleep apnea)   . RLS (restless legs syndrome)   . Raynaud disease   . Osteoarthrosis, generalized, multiple joints     CBC Latest Ref Rng & Units 01/11/2021 07/06/2020 09/12/2019  WBC 4.0 - 10.5 K/uL 9.5 10.8(H) 10.6(H)  Hemoglobin 12.0 - 15.0 g/dL 17.3(H) 16.9(H) 16.1(H)  Hematocrit 36.0 - 46.0 % 51.8(H) 51.0(H) 48.8(H)  Platelets 150.0 - 400.0 K/uL 185.0 181.0 282.0   BMP Latest Ref Rng & Units 01/11/2021 07/06/2020 09/12/2019  Glucose 70 - 99 mg/dL 90 78 84  BUN 6 - 23 mg/dL _0 Creatinine 0.40 - 1.20 mg/dL 1.02 0.97 0.99  Sodium 135 -  145 mEq/L 140 138 141  Potassium 3.5 - 5.1 mEq/L 3.6 3.8 4.5  Chloride 96 - 112 mEq/L 105 103 103  CO2 19 - 32 mEq/L _0 Calcium 8.4 - 10.5 mg/dL 9.9 10.3 10.3    Risks and benefits of anesthesia discussed at length, patient or surrogate demonstrates understanding. Appropriately NPO. Plan to proceed with anesthesia.  Champ Mungo, MD 03/18/21

## 2021-03-19 ENCOUNTER — Encounter: Payer: Self-pay | Admitting: Ophthalmology

## 2021-04-10 ENCOUNTER — Ambulatory Visit: Payer: Medicare Other | Admitting: Internal Medicine

## 2021-04-10 DIAGNOSIS — R0600 Dyspnea, unspecified: Secondary | ICD-10-CM | POA: Diagnosis not present

## 2021-04-17 DIAGNOSIS — R0602 Shortness of breath: Secondary | ICD-10-CM | POA: Diagnosis not present

## 2021-04-17 DIAGNOSIS — G4733 Obstructive sleep apnea (adult) (pediatric): Secondary | ICD-10-CM | POA: Diagnosis not present

## 2021-04-17 DIAGNOSIS — R000 Tachycardia, unspecified: Secondary | ICD-10-CM | POA: Diagnosis not present

## 2021-04-17 DIAGNOSIS — R0789 Other chest pain: Secondary | ICD-10-CM | POA: Diagnosis not present

## 2021-04-17 DIAGNOSIS — I10 Essential (primary) hypertension: Secondary | ICD-10-CM | POA: Diagnosis not present

## 2021-04-17 DIAGNOSIS — I517 Cardiomegaly: Secondary | ICD-10-CM | POA: Diagnosis not present

## 2021-04-17 DIAGNOSIS — M797 Fibromyalgia: Secondary | ICD-10-CM | POA: Diagnosis not present

## 2021-04-17 DIAGNOSIS — G629 Polyneuropathy, unspecified: Secondary | ICD-10-CM | POA: Diagnosis not present

## 2021-04-17 DIAGNOSIS — Z6841 Body Mass Index (BMI) 40.0 and over, adult: Secondary | ICD-10-CM | POA: Diagnosis not present

## 2021-04-18 DIAGNOSIS — R0602 Shortness of breath: Secondary | ICD-10-CM | POA: Diagnosis not present

## 2021-04-18 DIAGNOSIS — J189 Pneumonia, unspecified organism: Secondary | ICD-10-CM | POA: Diagnosis not present

## 2021-04-29 ENCOUNTER — Telehealth: Payer: Self-pay

## 2021-04-29 NOTE — Chronic Care Management (AMB) (Addendum)
Chronic Care Management Pharmacy Assistant   Name: Kari Medina  MRN: [id number] DOB: 04-Nov-1946   Reason for Encounter: Disease State    Conditions to be addressed/monitored: HTN and COPD  Recent office visits:  01/11/2021  Dr.Richard Silvio Pate, PCP - Started rosuvastatin 10mg  1 tablet daily, labs ordered, f/u 1 year  Recent consult visits:  04/17/2021 Cardiology - Initial Consult - CC SOB, monitoring and labs, follow up in 4 weeks.  04/10/2021 Pulmonology - follow up, SPN, possible cardiac cause, consult Dr. Ubaldo Glassing, continue current medications and CPAP. Follow i[ in 6 weeks. 03/18/2021  [place] [facility]  Ophthalmology Procedure 03/04/2021  [place] [facility] [place]  Ophthalmology Procedure 01/15/2021  [facility]  No medicine changes 01/09/2021  [facility]  No medicine changes, labs ordered, f/u 3 months  Hospital visits:  None in previous 6 months  Medications: Outpatient Encounter Medications as of 04/29/2021  Medication Sig   acetaminophen (TYLENOL) 650 MG CR tablet Take 650 mg by mouth 3 (three) times daily.   albuterol (PROVENTIL HFA) 108 (90 Base) MCG/ACT inhaler Inhale 2 puffs into the lungs every 6 (six) hours as needed for wheezing or shortness of breath.   aspirin EC 81 MG EC tablet Take 1 tablet (81 mg total) by mouth daily.   b complex vitamins tablet Take 1 tablet by mouth daily.   Bacillus Coagulans-Inulin (PROBIOTIC FORMULA PO) Take by mouth.   budesonide-formoterol (SYMBICORT) 160-4.5 MCG/ACT inhaler Inhale 2 puffs into the lungs 2 (two) times daily.   calcium carbonate (OS-CAL) 600 MG TABS Take 600 mg by mouth daily with breakfast.   CANNABIDIOL PO Take by mouth daily.   cholecalciferol (VITAMIN D) 1000 units tablet Take 2,000 Units by mouth 2 (two) times daily.   DULoxetine (CYMBALTA) 60 MG capsule TAKE 1 CAPSULE BY MOUTH  DAILY   fluticasone (FLONASE) 50 MCG/ACT nasal spray Place 1 spray  into both nostrils daily as needed. For stuffy nose   glucose blood (ONETOUCH VERIO) test strip Use to check blood sugar once a day. E11.9   hydrochlorothiazide (HYDRODIURIL) 12.5 MG tablet TAKE 1 TABLET BY MOUTH  DAILY   levothyroxine (SYNTHROID) 200 MCG tablet TAKE 1 TABLET BY MOUTH  DAILY BEFORE BREAKFAST   Magnesium 500 MG TABS Take 100 mg by mouth 2 (two) times daily.   metFORMIN (GLUCOPHAGE-XR) 500 MG 24 hr tablet TAKE 1 TABLET BY MOUTH  DAILY WITH BREAKFAST   montelukast (SINGULAIR) 10 MG tablet TAKE 1 TABLET BY MOUTH AT  BEDTIME   ONETOUCH DELICA LANCETS FINE MISC 1 each by Does not apply route daily. Use to check blood sugar once a day. E11.9   OVER THE COUNTER MEDICATION EYE SUPPLEMENT...VISION ALIVE   OVER THE COUNTER MEDICATION "Lung Supplement"   potassium chloride (K-DUR) 10 MEQ tablet Take 1 tablet (10 mEq total) by mouth daily.   propranolol (INDERAL) 40 MG tablet TAKE 1 TABLET BY MOUTH  TWICE DAILY   rOPINIRole (REQUIP) 3 MG tablet TAKE ONE TABLET BY MOUTH AT BEDTIME (Patient taking differently: 2 mg.)   rosuvastatin (CRESTOR) 10 MG tablet Take 1 tablet (10 mg total) by mouth daily. (Patient not taking: Reported on 02/25/2021)   traMADol (ULTRAM) 50 MG tablet Take 0.5-1 tablets (25-50 mg total) by mouth 3 (three) times daily as needed.   Turmeric Curcumin 500 MG CAPS Take 500 mg by mouth at bedtime.    vitamin C (ASCORBIC ACID) 500 MG tablet Take 500 mg by  mouth daily.   VITAMIN E PO Take by mouth.   No facility-administered encounter medications on file as of 04/29/2021.   Recent Office Vitals: BP Readings from Last 3 Encounters:  03/18/21 139/78  03/04/21 129/76  01/11/21 124/80   Pulse Readings from Last 3 Encounters:  03/18/21 64  03/04/21 68  01/11/21 82    Wt Readings from Last 3 Encounters:  03/18/21 240 lb (108.9 kg)  03/04/21 243 lb (110.2 kg)  01/11/21 249 lb (112.9 kg)     Kidney Function Lab Results  Component Value Date/Time   CREATININE 1.02  01/11/2021 01:33 PM   CREATININE 0.97 07/06/2020 11:59 AM   GFR 54.98 (L) 01/11/2021 01:33 PM   GFRNONAA 53 (L) 11/08/2018 01:00 PM   GFRAA >60 11/08/2018 01:00 PM    BMP Latest Ref Rng & Units 01/11/2021 07/06/2020 09/12/2019  Glucose 70 - 99 mg/dL 90 78 84  BUN 6 - 23 mg/dL 16 21 15   Creatinine 0.40 - 1.20 mg/dL 1.02 0.97 0.99  Sodium 135 - 145 mEq/L 140 138 141  Potassium 3.5 - 5.1 mEq/L 3.6 3.8 4.5  Chloride 96 - 112 mEq/L 105 103 103  CO2 19 - 32 mEq/L 28 26 29   Calcium 8.4 - 10.5 mg/dL 9.9 10.3 10.3   Hypertension  Current antihypertensive regimen:   Hydrochlorothiazide 12.5 mg - 1 tablet daily    Patient verbally confirms she is taking the above medications as directed. Yes  How often are you checking your Blood Pressure? when feeling symptomatic the patient reports she does not take her BP  Caffeine intake:  On occasion Salt intake: the patient reports she limits use OTC medications including pseudoephedrine or NSAIDs? No   Any readings above 180/120? No the patient reports no elevated blood pressure.   What recent interventions/DTPs have been made by any provider to improve Blood Pressure control since last CPP Visit:   None identified  Any recent hospitalizations or ED visits since last visit with CPP? Yes for procedures - 03/18/2021  [facility], 03/04/2021  [facility]  What diet changes have been made to improve Blood Pressure Control?  The patient reports she eat healthy meals,limits calorie intake  What exercise is being done to improve your Blood Pressure Control?  The patient reports she has low energy and unable to exercise  Adherence Review: Is the patient currently on ACE/ARB medication? No Does the patient have >5 day gap between last estimated fill dates? CPP to review   Star Rating Drugs:  Medication:  Last Fill: Day Supply Metformin 500mg  02/13/2021 90ds Rosuvastatin 10mg  01/11/2021 90ds  COPD  Current COPD regimen:   Rescue: Albuterol 108-90 - 2 puffs every 6 hours as needed Maintenance: Trelegy - 1 puff daily Montelukast 10 mg - 1 tablet daily  denies COPD symptoms, including Increased shortness of breath , Rescue medicine is not helping, Shortness of breath at rest, Symptoms worse with exercise, Symptoms worse at night and Wheezing   What recent interventions/DTPs have been made by any provider to improve breathing since last visit: changed Symbicort to Trelegy    Have you had exacerbation/flare-up since last visit? Yes the patient reports daily flare ups. The patient reports she uses the rescue inhaler several times a week.  What do you do when you are short of breath?  Rescue medication and Rest  Respiratory Devices/Equipment Do you have a nebulizer? Yes Do you use a Peak Flow Meter? Yes Do you use a maintenance inhaler? Yes  How often do you forget to use your daily inhaler? never Do you use a rescue inhaler? Yes How often do you use your rescue inhaler?  3-5x times per week Do you use a spacer with your inhaler? No  Adherence Review: Does the patient have >5 day gap between last estimated fill date for maintenance inhaler medications? No -Trelegy filled 04/10/21 30 DS   Star Rating Drugs: Drug Name  Last Fill Date Day Supply Metformin 500mg  02/13/2021 90ds Rosuvastatin 10mg  01/11/2021 90ds  Follow-Up:  Pharmacist Review  Debbora Dus, CPP notified  Avel Sensor, [place] Assistant [phone]  I have reviewed the care management and care coordination activities outlined in this encounter and I am certifying that I agree with the content of this note. Patient past due for rosuvastatin refill. Follow up next month to review adherence.  Debbora Dus, PharmD Clinical Pharmacist [facility] [phone]

## 2021-05-08 ENCOUNTER — Encounter: Payer: Self-pay | Admitting: Internal Medicine

## 2021-05-08 ENCOUNTER — Other Ambulatory Visit: Payer: Self-pay

## 2021-05-08 ENCOUNTER — Ambulatory Visit (INDEPENDENT_AMBULATORY_CARE_PROVIDER_SITE_OTHER): Payer: Medicare Other | Admitting: Internal Medicine

## 2021-05-08 VITALS — BP 126/80 | HR 80 | Ht 66.0 in | Wt 242.5 lb

## 2021-05-08 DIAGNOSIS — E063 Autoimmune thyroiditis: Secondary | ICD-10-CM | POA: Diagnosis not present

## 2021-05-08 DIAGNOSIS — E039 Hypothyroidism, unspecified: Secondary | ICD-10-CM

## 2021-05-08 LAB — TSH: TSH: 2.59 u[IU]/mL (ref 0.35–4.50)

## 2021-05-08 NOTE — Progress Notes (Signed)
Name: Kari Medina  MRN/ DOB: [id number], [birth year]    Age/ Sex: 75 y.o., female     PCP: Venia Carbon, MD   Reason for Endocrinology Evaluation: Hypothyroidism     Initial Endocrinology Clinic Visit: 01/09/2021    PATIENT IDENTIFIER: Kari Medina is a 75 y.o., female with a past medical history of HTN, hypothyroidism, raynaud's disease , COPD and OSA . She has followed with [facility] since 01/09/2021 for consultative assistance with management of her 01/09/2021.   HISTORICAL SUMMARY:  She has been diagnosed with Hashimoto's thyroiditis many years ago and was on a small dose  LT-4 replacement therapy until her total thyroidectomy surgery in 2006 secondary to what seems MNG, her dose had to be increased to 200 mcg.    Sister with thyroid disease  Nephew with graves' disease as well as a daughter    SUBJECTIVE:     Today (05/08/2021):  Kari Medina is here for hypothyroidism.  She is accompanied by her spouse today.  Weight has been stable  Denies constipation SOB stable on Oxygen  LE edema has been improving since starting furosemide Continues to have dry mouth and uses Biotene mouthwash  Has left scalp numbness for the past 2 months , no rash  No local neck symptoms  Levothyroxine 200 mcg daily      HISTORY:  Past Medical History:  Past Medical History:  Diagnosis Date  . Asthma   . Chronic hypoxemic respiratory failure (HCC)    uses O2 with exertion and with CPAP  . COPD (chronic obstructive pulmonary disease) ([place])   . Depression   . Diabetes mellitus without complication ([place])   . Dyspnea    doe  . Dysrhythmia    extra beat  . Fatty liver   . Fibromyalgia   . Generalized osteoarthritis of multiple sites   . History of hiatal hernia   . Hypertension   . Hypothyroidism   . Melanoma ([place]) 07/2018   Right leg  . OSA (obstructive sleep apnea)   . Pain    chronic ruq and back pain  . Panic attacks   . PONV  (postoperative nausea and vomiting)    after thyroidectomy  . Raynaud disease   . RLS (restless legs syndrome)   . 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 PATIENT IS UNCERTAIN  . Tremor, essential   . Wears dentures    full upper and lower   Past Surgical History:  Past Surgical History:  Procedure Laterality Date  . CATARACT EXTRACTION W/PHACO Right 03/04/2021   Procedure: CATARACT EXTRACTION PHACO AND INTRAOCULAR LENS PLACEMENT (IOC) RIGHT DIABETIC 3.98 00:33.2;  Surgeon: Eulogio Bear, MD;  Location: [facility];  Service: Ophthalmology;  Laterality: Right;  Diabetic - oral meds  . CATARACT EXTRACTION W/PHACO Left 03/18/2021   Procedure: CATARACT EXTRACTION PHACO AND INTRAOCULAR LENS PLACEMENT ([place]) LEFT;  Surgeon: Eulogio Bear, MD;  Location: [facility];  Service: Ophthalmology;  Laterality: Left;  2.96 0:27.9  . CHOLECYSTECTOMY    . DILATATION & CURETTAGE/HYSTEROSCOPY WITH MYOSURE N/A 11/10/2018   Procedure: DILATATION & CURETTAGE/HYSTEROSCOPY WITH MYOSURE/MYOMECTOMY;  Surgeon: Aletha Halim, MD;  Location: [facility];  Service: Gynecology;  Laterality: N/A;  possible myosure.  Please use myosure scope, do not open myosure blades but have in the room  . JOINT REPLACEMENT Bilateral    2008/2011  . MELANOMA EXCISION  07/2018  . REVERSE SHOULDER ARTHROPLASTY Right  05/28/2017   Procedure: REVERSE SHOULDER ARTHROPLASTY;  Surgeon: Corky Mull, MD;  Location: [facility];  Service: Orthopedics;  Laterality: Right;  . RHINOPLASTY  1972  . THYROIDECTOMY  2006  . TOTAL KNEE ARTHROPLASTY     bilateral    Social History:  reports that she quit smoking about 32 years ago. She has never used smokeless tobacco. She reports that she does not drink alcohol and does not use drugs. Family History:  Family History  Problem Relation Age of Onset  . Osteoarthritis Mother   . Diabetes Mother   . Cirrhosis Mother   . Cancer Father   .  Kidney cancer Father   . Bladder Cancer Father   . Heart disease Brother        stents in 1 brother  . Breast cancer Neg Hx      HOME MEDICATIONS: Allergies as of 05/08/2021      Reactions   Latex Hives   Tape and bandaids only   Nickel Hives   Pramipexole Other (See Comments)   Caused hallucinations, says he can take name brand.   Prednisone    Makes her "feel crazy"   Topiramate Other (See Comments)   "spaced out"   Citalopram Anxiety   Paroxetine Hcl Anxiety   Sertraline Anxiety      Medication List       Accurate as of May 08, 2021  4:31 PM. If you have any questions, ask your nurse or doctor.        acetaminophen 650 MG CR tablet Commonly known as: TYLENOL Take 650 mg by mouth 3 (three) times daily.   albuterol 108 (90 Base) MCG/ACT inhaler Commonly known as: Proventil HFA Inhale 2 puffs into the lungs every 6 (six) hours as needed for wheezing or shortness of breath.   aspirin 81 MG EC tablet Take 1 tablet (81 mg total) by mouth daily.   b complex vitamins tablet Take 1 tablet by mouth daily.   budesonide-formoterol 160-4.5 MCG/ACT inhaler Commonly known as: SYMBICORT Inhale 2 puffs into the lungs 2 (two) times daily.   calcium carbonate 600 MG Tabs tablet Commonly known as: OS-CAL Take 600 mg by mouth daily with breakfast.   CANNABIDIOL PO Take by mouth daily.   cholecalciferol 1000 units tablet Commonly known as: VITAMIN D Take 2,000 Units by mouth 2 (two) times daily.   DULoxetine 60 MG capsule Commonly known as: CYMBALTA TAKE 1 CAPSULE BY MOUTH  DAILY   fluticasone 50 MCG/ACT nasal spray Commonly known as: FLONASE Place 1 spray into both nostrils daily as needed. For stuffy nose   furosemide 20 MG tablet Commonly known as: LASIX Take 20 mg by mouth daily.   glucose blood test strip Commonly known as: OneTouch Verio Use to check blood sugar once a day. E11.9   hydrochlorothiazide 12.5 MG tablet Commonly known as: HYDRODIURIL TAKE  1 TABLET BY MOUTH  DAILY   levothyroxine 200 MCG tablet Commonly known as: SYNTHROID TAKE 1 TABLET BY MOUTH  DAILY BEFORE BREAKFAST   Magnesium 500 MG Tabs Take 100 mg by mouth 2 (two) times daily.   metFORMIN 500 MG 24 hr tablet Commonly known as: GLUCOPHAGE-XR TAKE 1 TABLET BY MOUTH  DAILY WITH BREAKFAST   montelukast 10 MG tablet Commonly known as: SINGULAIR TAKE 1 TABLET BY MOUTH AT  BEDTIME   OneTouch [place]ica Lancets Fine Misc 1 each by Does not apply route daily. Use to check blood sugar once a day. E11.9  OVER THE COUNTER MEDICATION EYE SUPPLEMENT...VISION ALIVE   OVER THE COUNTER MEDICATION "Lung Supplement"   potassium chloride 10 MEQ tablet Commonly known as: KLOR-CON Take 1 tablet (10 mEq total) by mouth daily.   PROBIOTIC FORMULA PO Take by mouth.   propranolol 40 MG tablet Commonly known as: INDERAL TAKE 1 TABLET BY MOUTH  TWICE DAILY   rOPINIRole 3 MG tablet Commonly known as: REQUIP TAKE ONE TABLET BY MOUTH AT BEDTIME What changed:   how much to take  how to take this  when to take this   rosuvastatin 10 MG tablet Commonly known as: CRESTOR Take 1 tablet (10 mg total) by mouth daily.   traMADol 50 MG tablet Commonly known as: ULTRAM Take 0.5-1 tablets (25-50 mg total) by mouth 3 (three) times daily as needed.   Turmeric Curcumin 500 MG Caps Take 500 mg by mouth at bedtime.   vitamin C 500 MG tablet Commonly known as: ASCORBIC ACID Take 500 mg by mouth daily.   VITAMIN E PO Take by mouth.         OBJECTIVE:   PHYSICAL EXAM: VS: BP 126/80   Pulse 80   Ht 5\' 6"  (1.676 m)   Wt 242 lb 8 oz (110 kg)   SpO2 94%   BMI 39.14 kg/m    EXAM: General: Pt appears well and is in NAD  Neck: General: Supple without adenopathy. Thyroid:  No goiter or nodules appreciated.   Lungs: Clear with good BS bilat with no rales, rhonchi, or wheezes  Heart: Auscultation: RRR.  Extremities: . BL LE: Trace pretibial edema   Mental Status:  Judgment, insight: Intact Orientation: Oriented to time, place, and person Mood and affect: No depression, anxiety, or agitation     DATA REVIEWED: Results for MERSADIE, KAVANAUGH (MRN [id number]) as of 05/09/2021 08:40  Ref. Range 05/08/2021 14:51  TSH Latest Ref Range: 0.35 - 4.50 uIU/mL 2.59      ASSESSMENT / PLAN / RECOMMENDATIONS:   1. Hashimoto's Thyroiditis:  - Pt is S/P total thyroidectomy in 2006 due to benign reasons  -Patient with nonspecific symptoms -No local neck symptoms -She has been taking levothyroxine appropriately    Medications  Continue levothyroxine 200 MCG daily    2.Scalp numbness:   This is localized to the left scalp. No facial neurological deficit on today's exam. I suspect this is superficial. She has chronic back/neck issues, she is in the process if establishing with new neurosurgeon Dr. Cari Caraway, pt to bring this to his attention    Follow-up in 1 year Signed electronically by: Mack Guise, MD  [facility]  [facility] [address] [place], [place] [address] Phone: [phone] FAX: [phone]      CC: Venia Carbon, MD [place] [address] Phone: [phone]  Fax: [phone]   Return to Endocrinology clinic as below: Future Appointments  Date Time Provider [place]  01/14/2022 11:30 AM Venia Carbon, MD [facility]  05/09/2022  2:00 PM Shermaine Rivet, Melanie Crazier, MD [facility] None

## 2021-05-08 NOTE — Patient Instructions (Signed)

## 2021-05-09 MED ORDER — LEVOTHYROXINE SODIUM 200 MCG PO TABS: ORAL_TABLET | ORAL | 3 refills | Status: DC

## 2021-05-23 ENCOUNTER — Telehealth: Payer: Self-pay

## 2021-05-23 NOTE — Progress Notes (Addendum)
Chronic Care Management Pharmacy Assistant   Name: Kari Medina  MRN: [id number] DOB: 1946-12-09  Reason for Encounter: Medication Review- Statin Adherence   Recent office visits:  None since last CCM contact  Recent consult visits:  05/08/21- Endocrinology- No medication changes  Hospital visits:  None in previous 6 months  Medications: Outpatient Encounter Medications as of 05/23/2021  Medication Sig   acetaminophen (TYLENOL) 650 MG CR tablet Take 650 mg by mouth 3 (three) times daily.   albuterol (PROVENTIL HFA) 108 (90 Base) MCG/ACT inhaler Inhale 2 puffs into the lungs every 6 (six) hours as needed for wheezing or shortness of breath.   aspirin EC 81 MG EC tablet Take 1 tablet (81 mg total) by mouth daily.   b complex vitamins tablet Take 1 tablet by mouth daily.   Bacillus Coagulans-Inulin (PROBIOTIC FORMULA PO) Take by mouth.   budesonide-formoterol (SYMBICORT) 160-4.5 MCG/ACT inhaler Inhale 2 puffs into the lungs 2 (two) times daily.   calcium carbonate (OS-CAL) 600 MG TABS Take 600 mg by mouth daily with breakfast.   CANNABIDIOL PO Take by mouth daily.   cholecalciferol (VITAMIN D) 1000 units tablet Take 2,000 Units by mouth 2 (two) times daily.   DULoxetine (CYMBALTA) 60 MG capsule TAKE 1 CAPSULE BY MOUTH  DAILY   fluticasone (FLONASE) 50 MCG/ACT nasal spray Place 1 spray into both nostrils daily as needed. For stuffy nose   furosemide (LASIX) 20 MG tablet Take 20 mg by mouth daily.   glucose blood (ONETOUCH VERIO) test strip Use to check blood sugar once a day. E11.9   hydrochlorothiazide (HYDRODIURIL) 12.5 MG tablet TAKE 1 TABLET BY MOUTH  DAILY   levothyroxine (SYNTHROID) 200 MCG tablet TAKE 1 TABLET BY MOUTH  DAILY BEFORE BREAKFAST   Magnesium 500 MG TABS Take 100 mg by mouth 2 (two) times daily.   metFORMIN (GLUCOPHAGE-XR) 500 MG 24 hr tablet TAKE 1 TABLET BY MOUTH  DAILY WITH BREAKFAST   montelukast (SINGULAIR) 10 MG tablet TAKE 1 TABLET BY MOUTH AT   BEDTIME   ONETOUCH DELICA LANCETS FINE MISC 1 each by Does not apply route daily. Use to check blood sugar once a day. E11.9   OVER THE COUNTER MEDICATION EYE SUPPLEMENT...VISION ALIVE   OVER THE COUNTER MEDICATION "Lung Supplement"   potassium chloride (K-DUR) 10 MEQ tablet Take 1 tablet (10 mEq total) by mouth daily.   propranolol (INDERAL) 40 MG tablet TAKE 1 TABLET BY MOUTH  TWICE DAILY   rOPINIRole (REQUIP) 3 MG tablet TAKE ONE TABLET BY MOUTH AT BEDTIME (Patient taking differently: 2 mg.)   rosuvastatin (CRESTOR) 10 MG tablet Take 1 tablet (10 mg total) by mouth daily.   traMADol (ULTRAM) 50 MG tablet Take 0.5-1 tablets (25-50 mg total) by mouth 3 (three) times daily as needed.   Turmeric Curcumin 500 MG CAPS Take 500 mg by mouth at bedtime.    vitamin C (ASCORBIC ACID) 500 MG tablet Take 500 mg by mouth daily.   VITAMIN E PO Take by mouth.   No facility-administered encounter medications on file as of 05/23/2021.    Contacted [name] to review adherence to medications.  Patient is > 5 days past due for refill on the following medications per chart history:       Rosuvastatin 10 mg take 1 tablet daily - Last filled 01/11/21 for 90 day supply  Patient is aware I am calling to review medications that show they are past due per insurance data.  Rosuvastatin indicated for cholesterol.   How are you taking your Rosuvastatin?  States not taking at all. Makes her feel bad. States she does not remember how it made her feel, she just remembers she did not like it. States she only took it a couple of weeks. It was started January 2022.   How many tablets per day? N/A   What time of day? Took in the morning when she was taking it.    What concerns do you have about this medication? Does not feel like she needs it and concerned it makes her feel bad.    Are you having any problems getting this medication from your pharmacy? No   Has the cost of this medication been a concern?  No  When was the prescription last filled? 01/11/21   Do you have excess medication on hand? Yes If yes please ask pt to explain patient not taking due to:not feeling well when she takes it    Counseled patient on:  Importance of taking medication daily without missed doses and contacting PCP whenever you have side effects  STAR Medications Name   Last fill date   Day Supply Metformin 500 mg 02/13/21 90 Rosuvastatin 10 mg 01/11/21 90  No appointments scheduled within the next 30 days.   Advised patient that sometimes the provider can change dosage or timing of medication to help combat side effects. Patient said she has not had a chance to discuss this with Dr. Silvio Pate and would like to at some point. She noted that Dr. Silvio Pate is unaware she stopped taking medication.   Follow-Up:  Pharmacist Review  Debbora Dus, CPP notified  [name], [place] Assistant [phone]  I have reviewed the care management and care coordination activities outlined in this encounter and I am certifying that I agree with the content of this note. See addendum.  Debbora Dus, PharmD Clinical Pharmacist [facility] at [facility] [phone]

## 2021-05-27 NOTE — Addendum Note (Signed)
Addended by: Debbora Dus on: 05/27/2021 02:54 PM   Modules accepted: Orders

## 2021-05-29 DIAGNOSIS — D225 Melanocytic nevi of trunk: Secondary | ICD-10-CM | POA: Diagnosis not present

## 2021-05-29 DIAGNOSIS — D2262 Melanocytic nevi of left upper limb, including shoulder: Secondary | ICD-10-CM | POA: Diagnosis not present

## 2021-05-29 DIAGNOSIS — Z711 Person with feared health complaint in whom no diagnosis is made: Secondary | ICD-10-CM | POA: Diagnosis not present

## 2021-05-29 DIAGNOSIS — L304 Erythema intertrigo: Secondary | ICD-10-CM | POA: Diagnosis not present

## 2021-05-29 DIAGNOSIS — L821 Other seborrheic keratosis: Secondary | ICD-10-CM | POA: Diagnosis not present

## 2021-05-29 DIAGNOSIS — Z85820 Personal history of malignant melanoma of skin: Secondary | ICD-10-CM | POA: Diagnosis not present

## 2021-05-29 DIAGNOSIS — D2271 Melanocytic nevi of right lower limb, including hip: Secondary | ICD-10-CM | POA: Diagnosis not present

## 2021-06-11 DIAGNOSIS — R0602 Shortness of breath: Secondary | ICD-10-CM | POA: Diagnosis not present

## 2021-06-13 DIAGNOSIS — Z6841 Body Mass Index (BMI) 40.0 and over, adult: Secondary | ICD-10-CM | POA: Diagnosis not present

## 2021-06-13 DIAGNOSIS — Z9981 Dependence on supplemental oxygen: Secondary | ICD-10-CM | POA: Diagnosis not present

## 2021-06-13 DIAGNOSIS — R918 Other nonspecific abnormal finding of lung field: Secondary | ICD-10-CM | POA: Diagnosis not present

## 2021-06-13 DIAGNOSIS — G4733 Obstructive sleep apnea (adult) (pediatric): Secondary | ICD-10-CM | POA: Diagnosis not present

## 2021-06-13 DIAGNOSIS — J310 Chronic rhinitis: Secondary | ICD-10-CM | POA: Diagnosis not present

## 2021-06-13 DIAGNOSIS — R0600 Dyspnea, unspecified: Secondary | ICD-10-CM | POA: Diagnosis not present

## 2021-06-17 DIAGNOSIS — Z6841 Body Mass Index (BMI) 40.0 and over, adult: Secondary | ICD-10-CM | POA: Diagnosis not present

## 2021-06-17 DIAGNOSIS — I10 Essential (primary) hypertension: Secondary | ICD-10-CM | POA: Diagnosis not present

## 2021-06-17 DIAGNOSIS — G4733 Obstructive sleep apnea (adult) (pediatric): Secondary | ICD-10-CM | POA: Diagnosis not present

## 2021-06-17 DIAGNOSIS — M797 Fibromyalgia: Secondary | ICD-10-CM | POA: Diagnosis not present

## 2021-06-17 DIAGNOSIS — R1084 Generalized abdominal pain: Secondary | ICD-10-CM | POA: Diagnosis not present

## 2021-06-17 DIAGNOSIS — R0602 Shortness of breath: Secondary | ICD-10-CM | POA: Diagnosis not present

## 2021-07-09 DIAGNOSIS — R198 Other specified symptoms and signs involving the digestive system and abdomen: Secondary | ICD-10-CM | POA: Diagnosis not present

## 2021-07-16 DIAGNOSIS — R198 Other specified symptoms and signs involving the digestive system and abdomen: Secondary | ICD-10-CM | POA: Diagnosis not present

## 2021-07-19 DIAGNOSIS — R198 Other specified symptoms and signs involving the digestive system and abdomen: Secondary | ICD-10-CM | POA: Diagnosis not present

## 2021-07-24 ENCOUNTER — Other Ambulatory Visit: Payer: Self-pay | Admitting: Surgery

## 2021-07-24 DIAGNOSIS — I712 Thoracic aortic aneurysm, without rupture, unspecified: Secondary | ICD-10-CM

## 2021-08-09 ENCOUNTER — Telehealth: Payer: Self-pay

## 2021-08-09 NOTE — Telephone Encounter (Signed)
Pt left v/m for [facility] CMA requesting rx for a walker with a seat. Pt said having some difficulty walking and prefers a walker that she would not have to bend over with because her back is bothering her. Pt last seen 01/11/21 for medicare wellness and at end of subjective note was mentioned about back pain. Pt request cb when rx ready. [facility] CMA said for a walker would not need OV due to durable medical equipment and to send note to Dr Silvio Pate.

## 2021-08-12 NOTE — Telephone Encounter (Signed)
Rx written up with osteoarthritis as dx. Husband is schedule for an OV today. We can give it to him.

## 2021-08-12 NOTE — Telephone Encounter (Signed)
Sounds good

## 2021-08-13 ENCOUNTER — Telehealth: Payer: Self-pay

## 2021-08-13 NOTE — Chronic Care Management (AMB) (Addendum)
Chronic Care Management Pharmacy Assistant   Name: Kari Medina  MRN: [id number] DOB: December 14, 1946   Reason for Encounter: CCM Reminder Call   Conditions to be addressed/monitored: HTN, COPD, and CKD Stage 3b   Medications: Outpatient Encounter Medications as of 08/13/2021  Medication Sig   acetaminophen (TYLENOL) 650 MG CR tablet Take 650 mg by mouth 3 (three) times daily.   albuterol (PROVENTIL HFA) 108 (90 Base) MCG/ACT inhaler Inhale 2 puffs into the lungs every 6 (six) hours as needed for wheezing or shortness of breath.   aspirin EC 81 MG EC tablet Take 1 tablet (81 mg total) by mouth daily.   b complex vitamins tablet Take 1 tablet by mouth daily.   Bacillus Coagulans-Inulin (PROBIOTIC FORMULA PO) Take by mouth.   budesonide-formoterol (SYMBICORT) 160-4.5 MCG/ACT inhaler Inhale 2 puffs into the lungs 2 (two) times daily.   calcium carbonate (OS-CAL) 600 MG TABS Take 600 mg by mouth daily with breakfast.   CANNABIDIOL PO Take by mouth daily.   cholecalciferol (VITAMIN D) 1000 units tablet Take 2,000 Units by mouth 2 (two) times daily.   DULoxetine (CYMBALTA) 60 MG capsule TAKE 1 CAPSULE BY MOUTH  DAILY   fluticasone (FLONASE) 50 MCG/ACT nasal spray Place 1 spray into both nostrils daily as needed. For stuffy nose   furosemide (LASIX) 20 MG tablet Take 20 mg by mouth daily.   glucose blood (ONETOUCH VERIO) test strip Use to check blood sugar once a day. E11.9   hydrochlorothiazide (HYDRODIURIL) 12.5 MG tablet TAKE 1 TABLET BY MOUTH  DAILY   levothyroxine (SYNTHROID) 200 MCG tablet TAKE 1 TABLET BY MOUTH  DAILY BEFORE BREAKFAST   Magnesium 500 MG TABS Take 100 mg by mouth 2 (two) times daily.   metFORMIN (GLUCOPHAGE-XR) 500 MG 24 hr tablet TAKE 1 TABLET BY MOUTH  DAILY WITH BREAKFAST   montelukast (SINGULAIR) 10 MG tablet TAKE 1 TABLET BY MOUTH AT  BEDTIME   ONETOUCH DELICA LANCETS FINE MISC 1 each by Does not apply route daily. Use to check blood sugar once a day. E11.9    OVER THE COUNTER MEDICATION EYE SUPPLEMENT...VISION ALIVE   OVER THE COUNTER MEDICATION "Lung Supplement"   potassium chloride (K-DUR) 10 MEQ tablet Take 1 tablet (10 mEq total) by mouth daily.   propranolol (INDERAL) 40 MG tablet TAKE 1 TABLET BY MOUTH  TWICE DAILY   rOPINIRole (REQUIP) 3 MG tablet TAKE ONE TABLET BY MOUTH AT BEDTIME (Patient taking differently: 2 mg.)   traMADol (ULTRAM) 50 MG tablet Take 0.5-1 tablets (25-50 mg total) by mouth 3 (three) times daily as needed.   Turmeric Curcumin 500 MG CAPS Take 500 mg by mouth at bedtime.    vitamin C (ASCORBIC ACID) 500 MG tablet Take 500 mg by mouth daily.   VITAMIN E PO Take by mouth.   No facility-administered encounter medications on file as of 08/13/2021.   Natalina Lesmeister was contacted to remind her of her upcoming telephone visit with Debbora Dus on 08/19/21 at 8:30am. Patient was reminded to have all medications, supplements and any blood glucose and blood pressure readings available for review at appointment.   Are you having any problems with your medications? No  Do you have any concerns you like to discuss with the pharmacist? No the patient does not have a primary concern.    Star Rating Drugs: Medication:  Last Fill: Day Supply Metformin XR '500mg'$      05/30/21 [place], CPP notified  Avel Sensor, [place] Pharmacy Assistant [phone]  I have reviewed the care management and care coordination activities outlined in this encounter and I am certifying that I agree with the content of this note. No further action required.  Debbora Dus, PharmD Clinical Pharmacist [facility] at [facility] [phone]

## 2021-08-19 ENCOUNTER — Ambulatory Visit (INDEPENDENT_AMBULATORY_CARE_PROVIDER_SITE_OTHER): Payer: Medicare Other

## 2021-08-19 ENCOUNTER — Other Ambulatory Visit: Payer: Self-pay

## 2021-08-19 DIAGNOSIS — R7303 Prediabetes: Secondary | ICD-10-CM

## 2021-08-19 DIAGNOSIS — J449 Chronic obstructive pulmonary disease, unspecified: Secondary | ICD-10-CM | POA: Diagnosis not present

## 2021-08-19 DIAGNOSIS — E039 Hypothyroidism, unspecified: Secondary | ICD-10-CM

## 2021-08-19 NOTE — Progress Notes (Signed)
Chronic Care Management Pharmacy Note  08/20/2021 Name:  Kari Medina MRN:  [id number] DOB:  [birth year]  Summary: Patient presented for CCM follow up visit. Focused on chief concern of chronic fatigue. Discussed COPD and hypothyroidism. Encouraged her to start using Trelegy daily in place of Symbicort (as prescribed), follow up with pulmonology/cardiology as planned, and restart her daily antihistamine. Also discussed interest in second opinion from endocrinology for hypothyroidism. Discussed diabetes briefly - BG today 79. Did not have other readings.   Recommendations/Changes made from today's visit: Call if BG < 70 or s/s hypoglycemia.  Request endo referral to Dr. Gabriel Carina from PCP Go ahead and switch your Symbicort with Trelegy (as prescribed) and resume daily antihistamine  Plan: CCM follow up 1 month Patient to schedule pulmonology follow up PCP visit 12/2021   Subjective: Kari Medina is an 75 y.o. year old female who is a primary patient of Kari Carbon, MD.  The CCM team was consulted for assistance with disease management and care coordination needs.    Engaged with patient by telephone for follow up visit in response to provider referral for pharmacy case management and/or care coordination services.   Consent to Services:  The patient was given information about Chronic Care Management services, agreed to services, and gave verbal consent prior to initiation of services.  Please see initial visit note for detailed documentation.   Patient Care Team: Kari Carbon, MD as PCP - General (Internal Medicine) Kari Medina, [facility] as Pharmacist (Pharmacist)  Recent office visits: 05/23/21 - CCM call - Patient not taking rosuvastatin due to concern for side effects/lack of need. 01/11/21 - PCP - Pt presented for AWV. Start rosuvastatin for aortic atherosclerosis on CT.  Recent consult visits: 07/09/21 - GI, Initial consult - Patient presented for chronic  right upper quadrant pain. Stool test. Discuss colonoscopy with pulmonologist. Follow up 1 month. 06/17/21 - Cardiology - Patient presented for follow up of SOB. HTN well controlled. Chest pain, no obvious ischemia. She is on CPAP, 24/7 oxygen. Holter monitor for palpitations. 06/13/21 - Pulmonology - Patient presented for SOB, COPD, OSA on CPAP. She stopped Lasix due to increased swelling. Weight loss recommended. Continue DuoNeb, Singulair daily. Start Trelegy after finishing out the Symbicort. Oxygen at 2 L. Continue Requip 3 mg. Follow up 6 weeks. 05/08/21 - Endocrinology - Pt presented for Hashimoto's Thyroiditis.  S/p thyroidectomy. Continue levothyroxine 200 mcg daily.  Hospital visits: None in previous 6 months   Objective:  Lab Results  Component Value Date   CREATININE 1.02 01/11/2021   BUN 16 01/11/2021   GFR 54.98 (L) 01/11/2021   GFRNONAA 53 (L) 11/08/2018   GFRAA >60 11/08/2018   NA 140 01/11/2021   K 3.6 01/11/2021   CALCIUM 9.9 01/11/2021   CO2 28 01/11/2021   GLUCOSE 90 01/11/2021    Lab Results  Component Value Date/Time   HGBA1C 6.5 01/11/2021 01:33 PM   HGBA1C 6.5 07/06/2020 11:59 AM   GFR 54.98 (L) 01/11/2021 01:33 PM   GFR 56.46 (L) 07/06/2020 11:59 AM    Last diabetic Eye exam:  Lab Results  Component Value Date/Time   HMDIABEYEEXA No Retinopathy 01/15/2021 12:00 AM    Last diabetic Foot exam: unknown  Lab Results  Component Value Date   CHOL 162 01/11/2021   HDL 40.00 01/11/2021   LDLCALC 87 01/11/2021   TRIG 178.0 (H) 01/11/2021   CHOLHDL 4 01/11/2021    Hepatic Function Latest Ref Rng & Units 01/11/2021 01/11/2021  07/06/2020  Total Protein 6.0 - 8.3 g/dL 7.1 - 6.7  Albumin 3.5 - 5.2 g/dL 3.9 3.9 3.9  AST 0 - 37 U/L 28 - 22  ALT 0 - 35 U/L 24 - 18  Alk Phosphatase 39 - 117 U/L 69 - 73  Total Bilirubin 0.2 - 1.2 mg/dL 0.7 - 0.7  Bilirubin, Direct 0.0 - 0.3 mg/dL 0.2 - -    Lab Results  Component Value Date/Time   TSH 2.59 05/08/2021 02:51  PM   TSH 2.68 01/09/2021 03:23 PM   FREET4 1.97 (H) 01/09/2021 03:23 PM   FREET4 1.19 07/06/2020 11:59 AM    CBC Latest Ref Rng & Units 01/11/2021 07/06/2020 09/12/2019  WBC 4.0 - 10.5 K/uL 9.5 10.8(H) 10.6(H)  Hemoglobin 12.0 - 15.0 g/dL 17.3(H) 16.9(H) 16.1(H)  Hematocrit 36.0 - 46.0 % 51.8(H) 51.0(H) 48.8(H)  Platelets 150.0 - 400.0 K/uL 185.0 181.0 282.0    No results found for: VD25OH  Clinical ASCVD: Yes  The 10-year ASCVD risk score Mikey Bussing DC Jr., et al., 2013) is: 49.9%   Values used to calculate the score:     Age: 75 years     Sex: Female     Is Non-Hispanic African American: No     Diabetic: Yes     Tobacco smoker: No     Systolic Blood Pressure: 599 mmHg     Is BP treated: Yes     HDL Cholesterol: 40 mg/dL     Total Cholesterol: 162 mg/dL    Depression screen [facility] 2/9 01/11/2021 08/20/2017  Decreased Interest 0 0  Down, Depressed, Hopeless 0 1  PHQ - 2 Score 0 1    Social History   Tobacco Use  Smoking Status Former   Types: Cigarettes   Quit date: 12/22/1988   Years since quitting: 32.6  Smokeless Tobacco Never   BP Readings from Last 3 Encounters:  05/08/21 126/80  03/18/21 139/78  03/04/21 129/76   Pulse Readings from Last 3 Encounters:  05/08/21 80  03/18/21 64  03/04/21 68   Wt Readings from Last 3 Encounters:  05/08/21 242 lb 8 oz (110 kg)  03/18/21 240 lb (108.9 kg)  03/04/21 243 lb (110.2 kg)   BMI Readings from Last 3 Encounters:  05/08/21 39.14 kg/m  03/18/21 38.74 kg/m  03/04/21 36.95 kg/m    Assessment/Interventions: Review of patient past medical history, allergies, medications, health status, including review of consultants reports, laboratory and other test data, was performed as part of comprehensive evaluation and provision of chronic care management services.   SDOH:  (Social Determinants of Health) assessments and interventions performed: Yes  SDOH Screenings   Alcohol Screen: Not on file  Depression (PHQ2-9): Low Risk     PHQ-2 Score: 0  Financial Resource Strain: Not on file  Food Insecurity: Not on file  Housing: Not on file  Physical Activity: Not on file  Social Connections: Not on file  Stress: Not on file  Tobacco Use: Medium Risk   Smoking Tobacco Use: Former   Smokeless Tobacco Use: Never  Transportation Needs: Not on file    [place]  Allergies  Allergen Reactions   Latex Hives    Tape and bandaids only   Nickel Hives   Pramipexole Other (See Comments)    Caused hallucinations, says he can take name brand.   Prednisone     Makes her "feel crazy"   Topiramate Other (See Comments)    "spaced out"   Citalopram Anxiety  Paroxetine Hcl Anxiety   Sertraline Anxiety    Medications Reviewed Today     Reviewed by Kari Medina, [facility] (Pharmacist) on 08/20/21 at 1127  Med List Status: <None>   Medication Order Taking? Sig Documenting Provider Last Dose Status Informant  acetaminophen (TYLENOL) 650 MG CR tablet [id number] No Take 650 mg by mouth 3 (three) times daily. [provider] Taking Active   albuterol (PROVENTIL HFA) 108 (90 Base) MCG/ACT inhaler [id number] No Inhale 2 puffs into the lungs every 6 (six) hours as needed for wheezing or shortness of breath. Kari Carbon, MD Taking Active   aspirin EC 81 MG EC tablet [id number] No Take 1 tablet (81 mg total) by mouth daily. Vaughan Basta, MD Taking Active   b complex vitamins tablet [id number] No Take 1 tablet by mouth daily. [provider] Taking Active   Bacillus Coagulans-Inulin (PROBIOTIC FORMULA PO) [id number] No Take by mouth. [provider] Taking Active   budesonide-formoterol (SYMBICORT) 160-4.5 MCG/ACT inhaler [id number] No Inhale 2 puffs into the lungs 2 (two) times daily. [provider] Taking Active   calcium carbonate (OS-CAL) 600 MG TABS [id number] No Take 600 mg by mouth daily with breakfast. [provider] Taking Active   CANNABIDIOL PO [id number] No Take by mouth  daily. [provider] Taking Active   cholecalciferol (VITAMIN D) 1000 units tablet [id number] No Take 2,000 Units by mouth 2 (two) times daily. [provider] Taking Active   DULoxetine (CYMBALTA) 60 MG capsule [id number] No TAKE 1 CAPSULE BY MOUTH  DAILY Kari Carbon, MD Taking Active   fluticasone (FLONASE) 50 MCG/ACT nasal spray [id number] No Place 1 spray into both nostrils daily as needed. For stuffy nose [provider] Taking Active   furosemide (LASIX) 20 MG tablet [id number]  Take 20 mg by mouth daily. [provider]  Active   glucose blood (ONETOUCH VERIO) test strip [id number] No Use to check blood sugar once a day. E11.9 Kari Carbon, MD Taking Active   hydrochlorothiazide (HYDRODIURIL) 12.5 MG tablet [id number] No TAKE 1 TABLET BY MOUTH  DAILY Kari Carbon, MD Taking Active   levothyroxine (SYNTHROID) 200 MCG tablet [id number]  TAKE 1 TABLET BY MOUTH  DAILY BEFORE BREAKFAST Shamleffer, Melanie Crazier, MD  Active   Magnesium 500 MG TABS [id number] No Take 100 mg by mouth 2 (two) times daily. [provider] Taking Active   metFORMIN (GLUCOPHAGE-XR) 500 MG 24 hr tablet [id number] No TAKE 1 TABLET BY MOUTH  DAILY WITH BREAKFAST Kari Carbon, MD Taking Active   montelukast (SINGULAIR) 10 MG tablet [id number] No TAKE 1 TABLET BY MOUTH AT  BEDTIME Kari Carbon, MD Taking Active   [facility] LANCETS FINE [place] [id number] No 1 each by Does not apply route daily. Use to check blood sugar once a day. E11.9 Kari Carbon, MD Taking Active   OVER THE COUNTER MEDICATION [id number] No EYE SUPPLEMENT...VISION ALIVE [provider] Taking Active   OVER THE [place] [id number] No "Lung Supplement" [provider] Taking Active   potassium chloride (K-DUR) 10 MEQ tablet [id number] No Take 1 tablet (10 mEq total) by mouth daily. Vaughan Basta, MD Taking Active   propranolol (INDERAL) 40 MG tablet  [id number] No TAKE 1 TABLET BY MOUTH  TWICE DAILY Kari Carbon, MD Taking Active   rOPINIRole (REQUIP) 3 MG tablet [id number] No TAKE ONE TABLET BY MOUTH AT BEDTIME  Patient taking differently: 2 mg.   Kari Carbon, MD  Taking Active   traMADol (ULTRAM) 50 MG tablet [id number] No Take 0.5-1 tablets (25-50 mg total) by mouth 3 (three) times daily as needed. Kari Carbon, MD Taking Active   Turmeric Curcumin 500 MG CAPS [id number] No Take 500 mg by mouth at bedtime.  [provider] Taking Active            Med Note Kenton Kingfisher, Kathrine Haddock May 19, 2017 10:06 AM)    vitamin C (ASCORBIC ACID) 500 MG tablet [id number] No Take 500 mg by mouth daily. [provider] Taking Active   VITAMIN E PO [id number] No Take by mouth. [provider] Taking Active             Patient Active Problem List   Diagnosis Date Noted   Aortic atherosclerosis ([place]) 01/11/2021   Stage 3b chronic kidney disease ([place]) 01/11/2021   Hashimoto's thyroiditis 01/10/2021   Hypothyroidism 07/06/2020   Chronic hypoxemic respiratory failure ([place]) 07/06/2020   Prediabetes 09/06/2018   History of melanoma 07/22/2018   Thoracic ascending aortic aneurysm ([place]) 04/07/2018   Mood disorder ([place]) 03/02/2018   Status post total bilateral knee replacement 12/03/2017   Preventative health care 08/20/2017   Urinary incontinence 08/20/2017   Advance directive discussed with patient 08/20/2017   Status post reverse total shoulder replacement, right 05/28/2017   Morbid obesity ([place]) 05/22/2016   COPD (chronic obstructive pulmonary disease) ([place]) 04/16/2016   Hypertension    Tremor, essential    OSA (obstructive sleep apnea)    RLS (restless legs syndrome)    Raynaud disease    Osteoarthrosis, generalized, multiple joints     Immunization History  Administered Date(s) Administered   Fluad Quad(high Dose 65+) 09/12/2019, 01/11/2021   Influenza,inj,Quad PF,6+ Mos 09/06/2018    Influenza-Unspecified 10/27/2017   Pneumococcal Conjugate-13 04/16/2016   Pneumococcal Polysaccharide-23 08/20/2017    Conditions to be addressed/monitored:  Diabetes, COPD, and Hypothyroidism  Care Plan : [place]  Updates made by Kari Medina, [place] since 08/20/2021 12:00 AM     Problem: CHL AMB "PATIENT-SPECIFIC PROBLEM"      Long-Range Goal: Disease Management   Start Date: 08/19/2021  Priority: High  Note:     Current Barriers:  Chief complaint - chronic fatigue   Pharmacist Clinical Goal(s):  Patient will contact provider office for questions/concerns as evidenced notation of same in electronic health record through collaboration with PharmD and provider.   Interventions: 1:1 collaboration with Kari Carbon, MD regarding development and update of comprehensive plan of care as evidenced by provider attestation and co-signature Inter-disciplinary care team collaboration (see longitudinal plan of care) Comprehensive medication review performed; medication list updated in electronic medical record  Diabetes (A1c goal <7%) -Controlled - A1c 6.5% -Current medications: Metformin 500 mg XR - 1 tablet daily -Medications previously tried: none reported  -Current home glucose readings - does not monitor routinely fasting glucose: 79 today  post prandial glucose: n/a -Denies hypoglycemic/hyperglycemic symptoms -Current exercise: minimal, fatigue, on oxgyen -Educated on A1c and blood sugar goals; Prevention and management of hypoglycemic episodes; -Counseled to check feet daily and get yearly eye exams -Recommended let us know if BG is running less than 90 consistently or s/s of hypoglycemia.  COPD (Goal: control symptoms and prevent exacerbations) -Not ideally controlled - Pt reports she was given Trelegy samples, but has not started yet due to finishing out Symbicort first. Insurance did not pay for the Trelegy, she has 1 month sample. She sees Dr. Vella Kohler  at [facility]. -Followed by pulmonology -Social History: quit smoking 30+ years ago.  -She does not have an O2 monitor.  -Current treatment  Symbicort 160-4/5 mcg/act - Inhale 2 puffs twice a day Albuterol - Inhale 2 puffs every 4-6 hours Montelukast - daily Allegra - daily (not taking currently) Supplemental oxygen -Medications previously tried: none reported -Exacerbations requiring treatment in last 6 months: none  -Patient reports consistent use of maintenance inhaler - Symbicort  -Frequency of rescue inhaler use: using albuterol nebulizer every morning this summer (heat). Albuterol inhaler as needed in addition. She limits use. -Counseled on Benefits of consistent maintenance inhaler use Differences between maintenance and rescue inhalers -Recommended go ahead and startTrelegy to see if this helps her symptoms. If she does well on Trelegy and cost is a concern, may apply for PAP. Start back on daily allergy medicine - Allegra  - or try switching to Xyzal.  Check with pulmonology about a follow up visit (DUE) and oxygen monitor.   Hypothyroidism (Goal: TSH/T4 WNL) -Controlled - per TSH, T4, but patient concerns her symptoms are not controlled - fatigue. -We discussed levothyroxine more reliable and effective than natural thyroid hormone in most patients, but could consider other treatment options. She would like to get a second endo opinion. Will request new referral. -Current treatment  Levothyroxine 200 mcg - 1 tablet daily before breakfast -Medications previously tried: switched from Synthroid 12/2019 to generic due to cost  -Collaborated with PCP - Patient request for endo referral to [facility].   Patient Goals/Self-Care Activities Patient will:  - follow up with specialists as recommended  Follow Up Plan: Telephone follow up appointment with care management team member scheduled for:  30 days     Medication Assistance:  Patient reports cost of Symbicort is high but  states she is likely not eligible for cost assistance at this time. This may change at start of the year, 2023. She will contact if interested.  Compliance/Adherence/Medication fill history: Care Gaps: Mammogram DEXA  Star-Rating Drugs: Medication:                Last Fill:         Day Supply Metformin XR 539m     05/30/21          90  [facility]  [id number]: [phone]: [phone] [facility] mail order (maintenance meds)  Uses pill box? unknown Pt endorses 100% compliance  Care Plan and Follow Up Patient Decision:  Patient agrees to Care Plan and Follow-up.  MDebbora Medina PharmD Clinical Pharmacist [facility] at [phone]

## 2021-08-20 ENCOUNTER — Telehealth: Payer: Self-pay

## 2021-08-20 DIAGNOSIS — E038 Other specified hypothyroidism: Secondary | ICD-10-CM

## 2021-08-20 DIAGNOSIS — E063 Autoimmune thyroiditis: Secondary | ICD-10-CM

## 2021-08-20 NOTE — Patient Instructions (Signed)
Dear Kari Medina,  Below is a summary of the goals we discussed during our follow up appointment on August 19, 2021. Please contact me anytime with questions or concerns.   Visit Information  Patient Care Plan: CCM Pharmacy Care Plan     Problem Identified: CHL AMB "PATIENT-SPECIFIC PROBLEM"      Long-Range Goal: Disease Management   Start Date: 08/19/2021  Priority: High  Note:     Current Barriers:  Chief complaint - chronic fatigue   Pharmacist Clinical Goal(s):  Patient will contact provider office for questions/concerns as evidenced notation of same in electronic health record through collaboration with PharmD and provider.   Interventions: 1:1 collaboration with Venia Carbon, MD regarding development and update of comprehensive plan of care as evidenced by provider attestation and co-signature Inter-disciplinary care team collaboration (see longitudinal plan of care) Comprehensive medication review performed; medication list updated in electronic medical record  Diabetes (A1c goal <7%) -Controlled - A1c 6.5% -Current medications: Metformin 500 mg XR - 1 tablet daily -Medications previously tried: none reported  -Current home glucose readings - does not monitor routinely fasting glucose: 79 today  post prandial glucose: n/a -Denies hypoglycemic/hyperglycemic symptoms -Current exercise: minimal, fatigue, on oxgyen -Educated on A1c and blood sugar goals; Prevention and management of hypoglycemic episodes; -Counseled to check feet daily and get yearly eye exams -Recommended let us know if BG is running less than 90 consistently or s/s of hypoglycemia.  COPD (Goal: control symptoms and prevent exacerbations) -Not ideally controlled - Pt reports she was given Trelegy samples, but has not started yet due to finishing out Symbicort first. Insurance did not pay for the Trelegy, she has 1 month sample. She sees Dr. Vella Kohler at [facility]. -Followed by  pulmonology -Social History: quit smoking 30+ years ago.  -She does not have an O2 monitor.  -Current treatment  Symbicort 160-4/5 mcg/act - Inhale 2 puffs twice a day Albuterol - Inhale 2 puffs every 4-6 hours Montelukast - daily Allegra - daily (not taking currently) Supplemental oxygen -Medications previously tried: none reported -Exacerbations requiring treatment in last 6 months: none  -Patient reports consistent use of maintenance inhaler - Symbicort  -Frequency of rescue inhaler use: using albuterol nebulizer every morning this summer (heat). Albuterol inhaler as needed in addition. She limits use. -Counseled on Benefits of consistent maintenance inhaler use Differences between maintenance and rescue inhalers -Recommended go ahead and startTrelegy to see if this helps her symptoms. If she does well on Trelegy and cost is a concern, may apply for PAP. Start back on daily allergy medicine - Allegra  - or try switching to Xyzal.  Check with pulmonology about a follow up visit (DUE) and oxygen monitor.   Hypothyroidism (Goal: TSH/T4 WNL) -Controlled - per TSH, T4, but patient concerns her symptoms are not controlled - fatigue. -We discussed levothyroxine more reliable and effective than natural thyroid hormone in most patients, but could consider other treatment options. She would like to get a second endo opinion. Will request new referral. -Current treatment  Levothyroxine 200 mcg - 1 tablet daily before breakfast -Medications previously tried: switched from Synthroid 12/2019 to generic due to cost  -Collaborated with PCP - Patient request for endo referral to [facility].   Patient Goals/Self-Care Activities Patient will:  - follow up with specialists as recommended  Follow Up Plan: Telephone follow up appointment with care management team member scheduled for:  30 days      Patient verbalizes understanding of instructions provided  today and agrees to view in [place].    Debbora Dus, PharmD Clinical Pharmacist [place] [facility] at [facility] [phone]

## 2021-08-20 NOTE — Telephone Encounter (Signed)
Referral ordered

## 2021-08-20 NOTE — Telephone Encounter (Signed)
Patient is interested in second opinion for thyroid concerns - chronic fatigue, supplement options, discussion of desiccated thyroid versus levothyroxine. She would like a referral to Dr. Lucilla Lame at [facility].   Debbora Dus, PharmD Clinical Pharmacist [place] [facility] at [facility] [phone]

## 2021-08-26 DIAGNOSIS — Z20822 Contact with and (suspected) exposure to covid-19: Secondary | ICD-10-CM | POA: Diagnosis not present

## 2021-09-03 ENCOUNTER — Telehealth: Payer: Self-pay

## 2021-09-03 NOTE — Telephone Encounter (Signed)
I am glad she was able to get in to be evaluated

## 2021-09-03 NOTE — Telephone Encounter (Signed)
PLEASE NOTE: All timestamps contained within this report are represented as [place] Standard Time. CONFIDENTIALTY NOTICE: This fax transmission is intended only for the addressee. It contains information that is legally privileged, confidential or otherwise protected from use or disclosure. If you are not the intended recipient, you are strictly prohibited from reviewing, disclosing, copying using or disseminating any of this information or taking any action in reliance on or regarding this information. If you have received this fax in error, please notify us immediately by telephone so that we can arrange for its return to [place]. Phone: [phone], Toll-Free: [phone], Fax: [phone] Page: 1 of 2 Call Id: [id number] [facility] - Client TELEPHONE ADVICE RECORD AccessNurse Patient Name: Kari Medina Gender: Female DOB: [birth year] Age: 75 Y 9 M 5 D Return Phone Number: [phone] (Primary) Address: City/ State/ Zip: [place]  [address] Client [facility] - Client Client Site [facility] - Day Physician [name]- MD Contact Type Call Who Is Calling Patient / Member / Family / Caregiver Call Type Triage / Clinical Relationship To Patient Self Return Phone Number [phone] (Primary) Chief Complaint Leg Swelling And Edema Reason for Call Symptomatic / Request for [place] states she has swelling on feet, ankles and legs. Caller states she is also leaking around ankles.( Call was tranferred from office for triage) Translation No Nurse Assessment Nurse: Nicki Reaper, RN, Malachy Mood Date/Time [name] Time): 09/03/2021 1:31:03 PM Confirm and document reason for call. If symptomatic, describe symptoms. ---Caller states she has swelling in her legs, feet and ankles that started, R ankle is leaking clear fluid, is not leaking a lot of fluid but it is enough for her  to be concerned about, R side is a little bigger than L side, has burning sensation on bottom of R foot and denies pain, she was seen 6 weeks ago for an [place] on her heart, EKG and was instructed her heart was fine, has some SOB but not worse than her normal, denies CP and other symptoms, no known exp to covid in last 14 days, drinking fluids and voiding, Is taking Hydrochlorothiazide daily Does the patient have any new or worsening symptoms? ---Yes Will a triage be completed? ---Yes Related visit to physician within the last 2 weeks? ---No Does the PT have any chronic conditions? (i.e. diabetes, asthma, this includes High risk factors for pregnancy, etc.) ---Yes List chronic conditions. ---COPD HTN Is this a behavioral health or substance abuse call? ---No Guidelines Guideline Title Affirmed Question Affirmed Notes Nurse Date/Time [name] Time) Leg Swelling and Edema [1] Thigh, calf, or ankle swelling AND Nicki Reaper, RN, Malachy Mood 09/03/2021 1:40:40 PM PLEASE NOTE: All timestamps contained within this report are represented as [place] Standard Time. CONFIDENTIALTY NOTICE: This fax transmission is intended only for the addressee. It contains information that is legally privileged, confidential or otherwise protected from use or disclosure. If you are not the intended recipient, you are strictly prohibited from reviewing, disclosing, copying using or disseminating any of this information or taking any action in reliance on or regarding this information. If you have received this fax in error, please notify us immediately by telephone so that we can arrange for its return to [place]. Phone: [phone], Toll-Free: [phone], Fax: [phone] Page: 2 of 2 Call Id: [id number] Guidelines Guideline Title Affirmed Question Affirmed Notes Nurse Date/Time [name] Time) [2] bilateral AND [3] 1 side is more swollen Disp. Time [name] Time) Disposition Final User  09/03/2021 1:53:46 PM Called  On-Call Provider Nicki Reaper, RN, Malachy Mood Reason: Mearl Latin 09/03/2021 1:52:24 PM See HCP within 4 Hours (or PCP triage) Yes Nicki Reaper, RN, Erskine Speed Disagree/Comply Comply Caller Understands Yes PreDisposition Call Doctor Care Advice Given Per Guideline SEE HCP (OR PCP TRIAGE) WITHIN 4 HOURS: * IF OFFICE WILL BE OPEN: You need to be seen within the next 3 or 4 hours. Call your doctor (or NP/PA) now or as soon as the office opens. CALL EMS IF: * Chest pain or shortness of breath occurs. Comments User: Burna Sis, RN Date/Time [name] Time): 09/03/2021 1:53:26 PM Refused office appt for today and UC for today, requested appt for tomorrow, transferred caller to [facility] in the office for appt tomorrow, Referrals Warm transfer to backline

## 2021-09-03 NOTE — Telephone Encounter (Signed)
Agree with UC and ER as well as close f/u tomorrow if able.

## 2021-09-03 NOTE — Telephone Encounter (Signed)
Malachy Mood RN with access nurse is transferring pt for an appt. Access nurse disposition is pt should be seen today within 3 - 4 hours. Pt refused appt today at [facility] office and UC. Pt has been taking HCTZ 12.5 mg daily.Will attach access nurse note to this phone note when available.I spoke with pt pt said she is having swelling in both lower legs; both lower legs are swollen and feel hard and tight like "her legs are going to pop."  The right lower leg is more swollen than lt lower leg and rt ankle started weeping on 09/01/21. Pt puts her legs up while sitting on and off. Pt also has redness on rt ankle where weeping is occurring. Both legs are cold to touch but no pain in legs. Pt said the swelling in lower legs started after seeing Dr Ubaldo Glassing in 05/2021.pt said sometimes the swelling goes down overnight but not every night. Pt is not sleeping well. No CP but pt has SOB upon exertion and resting and today has wheezing but pt said that is due to COPD and she will take a neb treatment which will help per pt. I offered pt an appt this afternoon with Dr Darnell Level at [facility] and and UC appt today but pt refused both; pt said she is not that bad. Pt said her husband is not home now and will  need to speak with him about scheduling an appt with [facility] on 09/04/21. Pt is not sure what time her husband will be home. UC & ED precautions given to pt and pt voiced understanding. Pt wants me to send note to DR Silvio Pate to see his suggestion. Dr Silvio Pate out of office this afternoon and will send this note to Dr Silvio Pate, DR Einar Pheasant who is in office and [facility] CMA. Will also teams [place].

## 2021-09-03 NOTE — Telephone Encounter (Signed)
Patient scheduled for an appointment with Romilda Garret NP tomorrow 09/04/21 at 11:00 am.

## 2021-09-04 ENCOUNTER — Ambulatory Visit: Payer: Medicare Other | Admitting: Nurse Practitioner

## 2021-09-09 ENCOUNTER — Telehealth: Payer: Self-pay

## 2021-09-09 NOTE — Progress Notes (Addendum)
Chronic Care Management Pharmacy Assistant   Name: Kari Medina  MRN: [id number] DOB: [birth year]   Reason for Encounter: Appointment Reminder   Medications: Outpatient Encounter Medications as of 09/09/2021  Medication Sig   acetaminophen (TYLENOL) 650 MG CR tablet Take 650 mg by mouth 3 (three) times daily.   albuterol (PROVENTIL HFA) 108 (90 Base) MCG/ACT inhaler Inhale 2 puffs into the lungs every 6 (six) hours as needed for wheezing or shortness of breath.   aspirin EC 81 MG EC tablet Take 1 tablet (81 mg total) by mouth daily.   b complex vitamins tablet Take 1 tablet by mouth daily.   Bacillus Coagulans-Inulin (PROBIOTIC FORMULA PO) Take by mouth.   budesonide-formoterol (SYMBICORT) 160-4.5 MCG/ACT inhaler Inhale 2 puffs into the lungs 2 (two) times daily.   calcium carbonate (OS-CAL) 600 MG TABS Take 600 mg by mouth daily with breakfast.   CANNABIDIOL PO Take by mouth daily.   cholecalciferol (VITAMIN D) 1000 units tablet Take 2,000 Units by mouth 2 (two) times daily.   DULoxetine (CYMBALTA) 60 MG capsule TAKE 1 CAPSULE BY MOUTH  DAILY   fluticasone (FLONASE) 50 MCG/ACT nasal spray Place 1 spray into both nostrils daily as needed. For stuffy nose   furosemide (LASIX) 20 MG tablet Take 20 mg by mouth daily.   glucose blood (ONETOUCH VERIO) test strip Use to check blood sugar once a day. E11.9   hydrochlorothiazide (HYDRODIURIL) 12.5 MG tablet TAKE 1 TABLET BY MOUTH  DAILY   levothyroxine (SYNTHROID) 200 MCG tablet TAKE 1 TABLET BY MOUTH  DAILY BEFORE BREAKFAST   Magnesium 500 MG TABS Take 100 mg by mouth 2 (two) times daily.   metFORMIN (GLUCOPHAGE-XR) 500 MG 24 hr tablet TAKE 1 TABLET BY MOUTH  DAILY WITH BREAKFAST   montelukast (SINGULAIR) 10 MG tablet TAKE 1 TABLET BY MOUTH AT  BEDTIME   ONETOUCH DELICA LANCETS FINE MISC 1 each by Does not apply route daily. Use to check blood sugar once a day. E11.9   OVER THE COUNTER MEDICATION EYE SUPPLEMENT...VISION ALIVE   OVER  THE COUNTER MEDICATION "Lung Supplement"   potassium chloride (K-DUR) 10 MEQ tablet Take 1 tablet (10 mEq total) by mouth daily.   propranolol (INDERAL) 40 MG tablet TAKE 1 TABLET BY MOUTH  TWICE DAILY   rOPINIRole (REQUIP) 3 MG tablet TAKE ONE TABLET BY MOUTH AT BEDTIME (Patient taking differently: 2 mg.)   traMADol (ULTRAM) 50 MG tablet Take 0.5-1 tablets (25-50 mg total) by mouth 3 (three) times daily as needed.   Turmeric Curcumin 500 MG CAPS Take 500 mg by mouth at bedtime.    vitamin C (ASCORBIC ACID) 500 MG tablet Take 500 mg by mouth daily.   VITAMIN E PO Take by mouth.   No facility-administered encounter medications on file as of 09/09/2021.   Rama Sorci was contacted to remind her of her upcoming telephone visit with Debbora Dus on 09/16/2021 at 2:30 pm. Patient was reminded to have all medications, supplements and any blood glucose and blood pressure readings available for review at appointment.  Are you having any problems with your medications? No  Do you have any concerns you like to discuss with the pharmacist? No  Star Rating Drugs: Medication:  Last Fill: Day Supply Metformin XR 500mg   08/21/2021     [place], CPP notified  Marijean Niemann, [place] Assistant [phone]   I have reviewed the care management and care coordination activities outlined in this  encounter and I am certifying that I agree with the content of this note. No further action required.  Debbora Dus, PharmD Clinical Pharmacist [facility] at [facility] [phone]

## 2021-09-10 ENCOUNTER — Other Ambulatory Visit: Payer: Self-pay

## 2021-09-10 ENCOUNTER — Encounter: Payer: Self-pay | Admitting: Internal Medicine

## 2021-09-10 ENCOUNTER — Ambulatory Visit (INDEPENDENT_AMBULATORY_CARE_PROVIDER_SITE_OTHER): Payer: Medicare Other | Admitting: Internal Medicine

## 2021-09-10 DIAGNOSIS — R600 Localized edema: Secondary | ICD-10-CM

## 2021-09-10 DIAGNOSIS — J449 Chronic obstructive pulmonary disease, unspecified: Secondary | ICD-10-CM

## 2021-09-10 DIAGNOSIS — I272 Pulmonary hypertension, unspecified: Secondary | ICD-10-CM

## 2021-09-10 DIAGNOSIS — R609 Edema, unspecified: Secondary | ICD-10-CM | POA: Insufficient documentation

## 2021-09-10 DIAGNOSIS — L97219 Non-pressure chronic ulcer of right calf with unspecified severity: Secondary | ICD-10-CM | POA: Insufficient documentation

## 2021-09-10 DIAGNOSIS — G4733 Obstructive sleep apnea (adult) (pediatric): Secondary | ICD-10-CM

## 2021-09-10 DIAGNOSIS — L97211 Non-pressure chronic ulcer of right calf limited to breakdown of skin: Secondary | ICD-10-CM

## 2021-09-10 DIAGNOSIS — I83012 Varicose veins of right lower extremity with ulcer of calf: Secondary | ICD-10-CM

## 2021-09-10 MED ORDER — TORSEMIDE 20 MG PO TABS: 20.0000 mg | ORAL_TABLET | Freq: Every day | ORAL | 3 refills | Status: DC | PRN

## 2021-09-10 NOTE — Assessment & Plan Note (Signed)
Likely has some venous insufficiency May be related to pulmonary HTN Didn't tolerate furosemide Will try torsemide 20mg  daily (hold if no swelling) Resume OTC potassium

## 2021-09-10 NOTE — Patient Instructions (Signed)
Please take the new fluid pill---torsemide--every morning that you wake up and your legs are swelling. If you take the torsemide, take the potassium (over the counter) also.

## 2021-09-10 NOTE — Assessment & Plan Note (Signed)
Probably from the lung disease Discussed the need for diuretic

## 2021-09-10 NOTE — Progress Notes (Signed)
Subjective:    Patient ID: Kari Medina, female    DOB: 05/11/1946, 75 y.o.   MRN: [id number]  HPI Here due to leg swelling--with husband This visit occurred during the SARS-CoV-2 public health emergency.  Safety protocols were in place, including screening questions prior to the visit, additional usage of staff PPE, and extensive cleaning of exam room while observing appropriate contact time as indicated for disinfecting solutions.   Legs are swollen Even noted some weeping the other day---prompted the concern Swelling is not new--but hadn't been weeping before Is having worsened SOB  Was seen by Dr Alinda Sierras by Dr Raul Del due to the breathing Had echo---fairly normal No chest pain No palpitations Imaging shows pulmonary HTN Got furosemide from Dr Lyndal Rainbow that made the swelling worse  Not sleeping well Falling asleep in the chair during the day Uses the CPAP and oxygen at home  Current Outpatient Medications on File Prior to Visit  Medication Sig Dispense Refill   acetaminophen (TYLENOL) 650 MG CR tablet Take 650 mg by mouth 3 (three) times daily.     albuterol (PROVENTIL HFA) 108 (90 Base) MCG/ACT inhaler Inhale 2 puffs into the lungs every 6 (six) hours as needed for wheezing or shortness of breath. 18 g 2   aspirin EC 81 MG EC tablet Take 1 tablet (81 mg total) by mouth daily. 30 tablet 0   budesonide-formoterol (SYMBICORT) 160-4.5 MCG/ACT inhaler Inhale 2 puffs into the lungs 2 (two) times daily.     calcium carbonate (OS-CAL) 600 MG TABS Take 600 mg by mouth daily with breakfast.     cholecalciferol (VITAMIN D) 1000 units tablet Take 2,000 Units by mouth 2 (two) times daily.     DULoxetine (CYMBALTA) 60 MG capsule TAKE 1 CAPSULE BY MOUTH  DAILY 90 capsule 3   fluticasone (FLONASE) 50 MCG/ACT nasal spray Place 1 spray into both nostrils daily as needed. For stuffy nose  3   glucose blood (ONETOUCH VERIO) test strip Use to check blood sugar once a day. E11.9 100  each 3   hydrochlorothiazide (HYDRODIURIL) 12.5 MG tablet TAKE 1 TABLET BY MOUTH  DAILY 90 tablet 3   levothyroxine (SYNTHROID) 200 MCG tablet TAKE 1 TABLET BY MOUTH  DAILY BEFORE BREAKFAST 90 tablet 3   Magnesium 500 MG TABS Take 100 mg by mouth 2 (two) times daily.     metFORMIN (GLUCOPHAGE-XR) 500 MG 24 hr tablet TAKE 1 TABLET BY MOUTH  DAILY WITH BREAKFAST 90 tablet 3   montelukast (SINGULAIR) 10 MG tablet TAKE 1 TABLET BY MOUTH AT  BEDTIME 90 tablet 3   ONETOUCH DELICA LANCETS FINE MISC 1 each by Does not apply route daily. Use to check blood sugar once a day. E11.9 100 each 3   propranolol (INDERAL) 40 MG tablet TAKE 1 TABLET BY MOUTH  TWICE DAILY 180 tablet 3   rOPINIRole (REQUIP) 3 MG tablet TAKE ONE TABLET BY MOUTH AT BEDTIME (Patient taking differently: 2 mg.) 90 tablet 3   Turmeric Curcumin 500 MG CAPS Take 500 mg by mouth at bedtime.      vitamin C (ASCORBIC ACID) 500 MG tablet Take 500 mg by mouth daily.     VITAMIN E PO Take by mouth.     potassium chloride (K-DUR) 10 MEQ tablet Take 1 tablet (10 mEq total) by mouth daily. (Patient not taking: Reported on 09/10/2021) 30 tablet 0   No current facility-administered medications on file prior to visit.    Allergies  Allergen Reactions  Latex Hives    Tape and bandaids only   Nickel Hives   Pramipexole Other (See Comments)    Caused hallucinations, says he can take name brand.   Prednisone     Makes her "feel crazy"   Topiramate Other (See Comments)    "spaced out"   Citalopram Anxiety   Paroxetine Hcl Anxiety   Sertraline Anxiety    Past Medical History:  Diagnosis Date   Asthma    Chronic hypoxemic respiratory failure ([place])    uses O2 with exertion and with CPAP   COPD (chronic obstructive pulmonary disease) ([place])    Depression    Diabetes mellitus without complication (HCC)    Dyspnea    doe   Dysrhythmia    extra beat   Fatty liver    Fibromyalgia    Generalized osteoarthritis of multiple sites    History  of hiatal hernia    Hypertension    Hypothyroidism    Melanoma ([place]) 07/2018   Right leg   OSA (obstructive sleep apnea)    Pain    chronic ruq and back pain   Panic attacks    PONV (postoperative nausea and vomiting)    after thyroidectomy   Raynaud disease    RLS (restless legs syndrome)    Spleen absent    TOLD ABSENT THEN TOLD DOES HAVE SPLEEN. PATIENT IS UNCERTAIN   Tremor, essential    Wears dentures    full upper and lower    Past Surgical History:  Procedure Laterality Date   CATARACT EXTRACTION W/PHACO Right 03/04/2021   Procedure: CATARACT EXTRACTION PHACO AND INTRAOCULAR LENS PLACEMENT (IOC) RIGHT DIABETIC 3.98 00:33.2;  Surgeon: Eulogio Bear, MD;  Location: [place];  Service: Ophthalmology;  Laterality: Right;  Diabetic - oral meds   CATARACT EXTRACTION W/PHACO Left 03/18/2021   Procedure: CATARACT EXTRACTION PHACO AND INTRAOCULAR LENS PLACEMENT ([place]) LEFT;  Surgeon: Eulogio Bear, MD;  Location: [place];  Service: Ophthalmology;  Laterality: Left;  2.96 0:27.9   CHOLECYSTECTOMY     DILATATION & CURETTAGE/HYSTEROSCOPY WITH MYOSURE N/A 11/10/2018   Procedure: DILATATION & CURETTAGE/HYSTEROSCOPY WITH MYOSURE/MYOMECTOMY;  Surgeon: Aletha Halim, MD;  Location: [place];  Service: Gynecology;  Laterality: N/A;  possible myosure.  Please use myosure scope, do not open myosure blades but have in the room   JOINT REPLACEMENT Bilateral    2008/2011   MELANOMA EXCISION  07/2018   REVERSE SHOULDER ARTHROPLASTY Right 05/28/2017   Procedure: REVERSE SHOULDER ARTHROPLASTY;  Surgeon: Corky Mull, MD;  Location: [facility];  Service: Orthopedics;  Laterality: Right;   RHINOPLASTY  1972   THYROIDECTOMY  2006   TOTAL KNEE ARTHROPLASTY     bilateral    Family History  Problem Relation Age of Onset   Osteoarthritis Mother    Diabetes Mother    Cirrhosis Mother    Cancer Father    Kidney cancer Father    Bladder Cancer Father     Heart disease Brother        stents in 1 brother   Breast cancer Neg Hx     Social History   Socioeconomic History   Marital status: Married    Spouse name: Not on file   Number of children: 1   Years of education: Not on file   Highest education level: Not on file  Occupational History   Occupation: Neurosurgeon    Comment: Retired  Tobacco Use   Smoking status: Former  Types: Cigarettes    Quit date: 12/22/1988    Years since quitting: 32.7   Smokeless tobacco: Never  Vaping Use   Vaping Use: Never used  Substance and Sexual Activity   Alcohol use: No   Drug use: No   Sexual activity: Not on file  Other Topics Concern   Not on file  Social History Narrative   1 daughter      Has living will   Husband has health care POA. Alternate would be daughter Judson Roch   Would allow resuscitation but no prolonged machines   Not sure about feeding tubes   Social Determinants of Health   Financial Resource Strain: Not on file  Food Insecurity: Not on file  Transportation Needs: Not on file  Physical Activity: Not on file  Stress: Not on file  Social Connections: Not on file  Intimate Partner Violence: Not on file   Review of Systems Not much appetite Weight is down 8# despite fluid No energy!    Objective:   Physical Exam Constitutional:      Appearance: Normal appearance.  Cardiovascular:     Rate and Rhythm: Normal rate and regular rhythm.     Heart sounds: No murmur heard.   No gallop.  Pulmonary:     Effort: Pulmonary effort is normal.     Breath sounds: No wheezing or rales.     Comments: Decreased breath sounds but clear Musculoskeletal:     Cervical back: Neck supple.     Comments: 1-2+ non pitting edema bilaterally  Lymphadenopathy:     Cervical: No cervical adenopathy.  Skin:    Comments: Small non inflamed ulcer on right calf--not infected  Neurological:     Mental Status: She is alert.           Assessment & Plan:

## 2021-09-10 NOTE — Assessment & Plan Note (Signed)
Likely reason for the SOB Rx per Dr Raul Del

## 2021-09-10 NOTE — Assessment & Plan Note (Signed)
Urged her to get download on her CPAP to make sure it is working--and not the reason for her fatigue

## 2021-09-10 NOTE — Assessment & Plan Note (Signed)
Superficial and not infected Hopefully should improve with diuresis

## 2021-09-11 ENCOUNTER — Ambulatory Visit: Payer: Medicare Other | Admitting: Surgery

## 2021-09-11 ENCOUNTER — Inpatient Hospital Stay: Admission: RE | Admit: 2021-09-11 | Payer: Medicare Other | Source: Ambulatory Visit

## 2021-09-16 ENCOUNTER — Telehealth: Payer: Self-pay

## 2021-09-16 ENCOUNTER — Telehealth: Payer: Medicare Other

## 2021-09-16 NOTE — Chronic Care Management (AMB) (Addendum)
Chronic Care Management Pharmacy Assistant   Name: Kari Medina  MRN: [id number] DOB: 1946/03/30  Reason for Encounter:  General Adherence   Recent office visits:  09/10/21 - PCP - Patient presented for follow up leg swelling. Take torsemide every morning if your legs are swelling with potassium (OTC).  Recent consult visits:  Since last CCM contact  Hospital visits:  None in previous 6 months  Medications: Outpatient Encounter Medications as of 09/16/2021  Medication Sig   acetaminophen (TYLENOL) 650 MG CR tablet Take 650 mg by mouth 3 (three) times daily.   albuterol (PROVENTIL HFA) 108 (90 Base) MCG/ACT inhaler Inhale 2 puffs into the lungs every 6 (six) hours as needed for wheezing or shortness of breath.   aspirin EC 81 MG EC tablet Take 1 tablet (81 mg total) by mouth daily.   budesonide-formoterol (SYMBICORT) 160-4.5 MCG/ACT inhaler Inhale 2 puffs into the lungs 2 (two) times daily.   calcium carbonate (OS-CAL) 600 MG TABS Take 600 mg by mouth daily with breakfast.   cholecalciferol (VITAMIN D) 1000 units tablet Take 2,000 Units by mouth 2 (two) times daily.   DULoxetine (CYMBALTA) 60 MG capsule TAKE 1 CAPSULE BY MOUTH  DAILY   fluticasone (FLONASE) 50 MCG/ACT nasal spray Place 1 spray into both nostrils daily as needed. For stuffy nose   glucose blood (ONETOUCH VERIO) test strip Use to check blood sugar once a day. E11.9   hydrochlorothiazide (HYDRODIURIL) 12.5 MG tablet TAKE 1 TABLET BY MOUTH  DAILY   levothyroxine (SYNTHROID) 200 MCG tablet TAKE 1 TABLET BY MOUTH  DAILY BEFORE BREAKFAST   Magnesium 500 MG TABS Take 100 mg by mouth 2 (two) times daily.   metFORMIN (GLUCOPHAGE-XR) 500 MG 24 hr tablet TAKE 1 TABLET BY MOUTH  DAILY WITH BREAKFAST   montelukast (SINGULAIR) 10 MG tablet TAKE 1 TABLET BY MOUTH AT  BEDTIME   ONETOUCH DELICA LANCETS FINE MISC 1 each by Does not apply route daily. Use to check blood sugar once a day. E11.9   potassium chloride (K-DUR) 10  MEQ tablet Take 1 tablet (10 mEq total) by mouth daily. (Patient not taking: Reported on 09/10/2021)   propranolol (INDERAL) 40 MG tablet TAKE 1 TABLET BY MOUTH  TWICE DAILY   rOPINIRole (REQUIP) 3 MG tablet TAKE ONE TABLET BY MOUTH AT BEDTIME (Patient taking differently: 2 mg.)   torsemide (DEMADEX) 20 MG tablet Take 1 tablet (20 mg total) by mouth daily as needed.   Turmeric Curcumin 500 MG CAPS Take 500 mg by mouth at bedtime.    vitamin C (ASCORBIC ACID) 500 MG tablet Take 500 mg by mouth daily.   VITAMIN E PO Take by mouth.   No facility-administered encounter medications on file as of 09/16/2021.    Attempted contact with Kari Medina 3 times on 09/16/21,09/17/21,09/19/21. Unsuccessful outreach. Will attempt contact next month.   Patient is not > 5 days past due for refill on the following medications per chart history:  Star Medications: Medication Name/mg Last Fill Days Supply Metformin XR 500mg   08/21/21 90  Care Gaps: Annual wellness visit in last year? Yes  01/11/21 Most Recent BP reading: 118/72  84-P  09/10/21  PCP appointment on 10/10/21 and Cardiology appointment on 10/02/21  Kari Medina, CPP notified  Kari Medina, [place] Assistant [phone]  I have reviewed the care management and care coordination activities outlined in this encounter and I am certifying that I agree with the content of this note. No  further action required.  Kari Medina, PharmD Clinical Pharmacist [facility] at [facility] [phone]

## 2021-09-26 ENCOUNTER — Ambulatory Visit
Admission: RE | Admit: 2021-09-26 | Discharge: 2021-09-26 | Disposition: A | Discharge status: Home / Self Care | Payer: Medicare Other | Source: Ambulatory Visit | Attending: Surgery | Admitting: Surgery

## 2021-09-26 DIAGNOSIS — R911 Solitary pulmonary nodule: Secondary | ICD-10-CM | POA: Diagnosis not present

## 2021-09-26 DIAGNOSIS — I7121 Aneurysm of the ascending aorta, without rupture: Secondary | ICD-10-CM | POA: Diagnosis not present

## 2021-09-26 DIAGNOSIS — J9811 Atelectasis: Secondary | ICD-10-CM | POA: Diagnosis not present

## 2021-09-26 DIAGNOSIS — I712 Thoracic aortic aneurysm, without rupture, unspecified: Secondary | ICD-10-CM

## 2021-09-26 MED ORDER — IOPAMIDOL (ISOVUE-370) INJECTION 76%
60.0000 mL | Freq: Once | INTRAVENOUS | Status: AC | PRN
  Administered 2021-09-26: 60 mL via INTRAVENOUS

## 2021-10-01 ENCOUNTER — Other Ambulatory Visit: Payer: Self-pay | Admitting: Internal Medicine

## 2021-10-02 ENCOUNTER — Other Ambulatory Visit: Payer: Self-pay

## 2021-10-02 ENCOUNTER — Ambulatory Visit (INDEPENDENT_AMBULATORY_CARE_PROVIDER_SITE_OTHER): Payer: Medicare Other | Admitting: Surgery

## 2021-10-02 ENCOUNTER — Encounter: Payer: Self-pay | Admitting: Surgery

## 2021-10-02 VITALS — BP 89/61 | HR 83 | Resp 20 | Ht 64.0 in

## 2021-10-02 DIAGNOSIS — I712 Thoracic aortic aneurysm, without rupture, unspecified: Secondary | ICD-10-CM

## 2021-10-02 NOTE — Progress Notes (Signed)
HPI:  This 75 year old woman returns for follow-up of a stable 4.5 cm fusiform ascending aortic aneurysm.  She has chronic shortness of breath and lower extremity edema for which she has been followed by Dr. Ubaldo Glassing and Dr. Raul Del.  She is on chronic oxygen therapy at home and uses CPAP.  She reportedly had an echo that was fairly normal but does have known pulmonary hypertension.  Her work-up for this has been through [facility].  She is here today in a wheelchair with her husband.  Current Outpatient Medications  Medication Sig Dispense Refill   acetaminophen (TYLENOL) 650 MG CR tablet Take 650 mg by mouth 3 (three) times daily.     albuterol (PROVENTIL HFA) 108 (90 Base) MCG/ACT inhaler Inhale 2 puffs into the lungs every 6 (six) hours as needed for wheezing or shortness of breath. 18 g 2   aspirin EC 81 MG EC tablet Take 1 tablet (81 mg total) by mouth daily. 30 tablet 0   budesonide-formoterol (SYMBICORT) 160-4.5 MCG/ACT inhaler Inhale 2 puffs into the lungs 2 (two) times daily.     calcium carbonate (OS-CAL) 600 MG TABS Take 600 mg by mouth daily with breakfast.     cholecalciferol (VITAMIN D) 1000 units tablet Take 2,000 Units by mouth 2 (two) times daily.     DULoxetine (CYMBALTA) 60 MG capsule TAKE 1 CAPSULE BY MOUTH  DAILY 90 capsule 3   fluticasone (FLONASE) 50 MCG/ACT nasal spray Place 1 spray into both nostrils daily as needed. For stuffy nose  3   Fluticasone-Umeclidin-Vilant (TRELEGY ELLIPTA) 100-62.5-25 MCG/INH AEPB Inhale into the lungs.     glucose blood (ONETOUCH VERIO) test strip Use to check blood sugar once a day. E11.9 100 each 3   hydrochlorothiazide (HYDRODIURIL) 12.5 MG tablet TAKE 1 TABLET BY MOUTH  DAILY 90 tablet 3   levothyroxine (SYNTHROID) 200 MCG tablet TAKE 1 TABLET BY MOUTH  DAILY BEFORE BREAKFAST 90 tablet 3   Magnesium 500 MG TABS Take 100 mg by mouth 2 (two) times daily.     metFORMIN (GLUCOPHAGE-XR) 500 MG 24 hr tablet TAKE 1 TABLET BY MOUTH  DAILY  WITH BREAKFAST 90 tablet 3   montelukast (SINGULAIR) 10 MG tablet TAKE 1 TABLET BY MOUTH AT  BEDTIME 90 tablet 3   ONETOUCH DELICA LANCETS FINE MISC 1 each by Does not apply route daily. Use to check blood sugar once a day. E11.9 100 each 3   potassium chloride (K-DUR) 10 MEQ tablet Take 1 tablet (10 mEq total) by mouth daily. 30 tablet 0   propranolol (INDERAL) 40 MG tablet TAKE 1 TABLET BY MOUTH  TWICE DAILY 180 tablet 3   rOPINIRole (REQUIP) 3 MG tablet TAKE ONE TABLET BY MOUTH AT BEDTIME (Patient taking differently: 2 mg.) 90 tablet 3   torsemide (DEMADEX) 20 MG tablet Take 1 tablet (20 mg total) by mouth daily as needed. 30 tablet 3   Turmeric Curcumin 500 MG CAPS Take 500 mg by mouth at bedtime.      vitamin C (ASCORBIC ACID) 500 MG tablet Take 500 mg by mouth daily.     VITAMIN E PO Take by mouth.     No current facility-administered medications for this visit.     Physical Exam: BP (!) 89/61 (BP Location: Right Arm, Patient Position: Sitting)   Pulse 83   Resp 20   Ht 5\' 4"  (1.626 m)   SpO2 (!) 87% Comment: 2L O2  BMI 40.17 kg/m  Morbidly obese woman  who looks uncomfortable. Cardiac exam shows regular rate and rhythm with normal heart sounds.  There is no murmur. Lungs are clear. There is 2+ edema in both lower extremities   Diagnostic Tests:  Narrative & Impression  CLINICAL DATA:  Thoracic aortic aneurysm.   EXAM: CT ANGIOGRAPHY CHEST WITH CONTRAST   TECHNIQUE: Multidetector CT imaging of the chest was performed using the standard protocol during bolus administration of intravenous contrast. Multiplanar CT image reconstructions and MIPs were obtained to evaluate the vascular anatomy.   CONTRAST:  66mL ISOVUE-370 IOPAMIDOL (ISOVUE-370) INJECTION 76%   COMPARISON:  November 26, 2020.   FINDINGS: Cardiovascular: Grossly stable 4.2 cm ascending thoracic aortic aneurysm is noted without dissection. Atherosclerosis of thoracic aorta is noted. Great vessels are  widely patent without significant stenosis. Mild cardiomegaly is noted. No pericardial effusion. Enlarged pulmonary arteries are noted suggesting pulmonary artery hypertension.   Mediastinum/Nodes: No enlarged mediastinal, hilar, or axillary lymph nodes. Thyroid gland, trachea, and esophagus demonstrate no significant findings.   Lungs/Pleura: No pneumothorax or pleural effusion is noted. Minimal to mild bibasilar subsegmental atelectasis is noted. Stable 4 mm nodule is noted in right upper lobe best seen on image number 65 of series 9.   Upper Abdomen: No acute abnormality.   Musculoskeletal: No chest wall abnormality. No acute or significant osseous findings.   Review of the MIP images confirms the above findings.   IMPRESSION: Grossly stable 4.2 cm ascending thoracic aortic aneurysm. Recommend annual imaging followup by CTA or MRA. This recommendation follows 2010 ACCF/AHA/AATS/ACR/ASA/SCA/SCAI/SIR/STS/SVM Guidelines for the Diagnosis and Management of Patients with Thoracic Aortic Disease. Circulation. 2010; 121: K599-J570. Aortic aneurysm NOS (ICD10-I71.9).   Stable 4 mm nodule is noted in right upper lobe. Attention to this on follow-up imaging is recommended.   Minimal to mild bibasilar subsegmental atelectasis is noted.   Enlarged pulmonary arteries are noted suggesting pulmonary artery hypertension.   Aortic Atherosclerosis (ICD10-I70.0).     Electronically Signed   By: Marijo Conception M.D.   On: 09/26/2021 15:23      Impression:  She has a stable 4.2 cm ascending aortic aneurysm which was actually measured slightly smaller than 2 years ago when it was 4.5 cm.  This is well below the surgical threshold of 5.5 cm.  There is a stable 4 mm nodule in the right upper lobe.  Her pulmonary arteries are enlarged with her main pulmonary artery being the same size as her ascending aorta suggesting pulmonary hypertension.  I reviewed the CTA images with her and her  husband and answered their questions.  I stressed the importance of continued good blood pressure control in preventing further enlargement and acute aortic dissection.  I think it is reasonable to do a follow-up CTA of the chest in 2 years since her aneurysm is relatively small and stable.  Plan:  She will return to see me in 2 years with a CTA of the chest.  I spent 20 minutes performing this established patient evaluation and > 50% of this time was spent face to face counseling and coordinating the care of this patient's aortic aneurysm.    Gaye Pollack, MD [facility] [phone]

## 2021-10-10 ENCOUNTER — Ambulatory Visit: Payer: Medicare Other | Admitting: Internal Medicine

## 2021-10-23 DIAGNOSIS — Z6837 Body mass index (BMI) 37.0-37.9, adult: Secondary | ICD-10-CM | POA: Diagnosis not present

## 2021-10-23 DIAGNOSIS — C4371 Malignant melanoma of right lower limb, including hip: Secondary | ICD-10-CM | POA: Diagnosis not present

## 2021-10-29 ENCOUNTER — Encounter: Payer: Self-pay | Admitting: Internal Medicine

## 2021-10-29 ENCOUNTER — Other Ambulatory Visit: Payer: Self-pay

## 2021-10-29 ENCOUNTER — Ambulatory Visit (INDEPENDENT_AMBULATORY_CARE_PROVIDER_SITE_OTHER): Payer: Medicare Other | Admitting: Internal Medicine

## 2021-10-29 VITALS — BP 118/80 | HR 69 | Temp 97.7°F | Ht 64.0 in

## 2021-10-29 DIAGNOSIS — I83012 Varicose veins of right lower extremity with ulcer of calf: Secondary | ICD-10-CM | POA: Diagnosis not present

## 2021-10-29 DIAGNOSIS — L97211 Non-pressure chronic ulcer of right calf limited to breakdown of skin: Secondary | ICD-10-CM | POA: Diagnosis not present

## 2021-10-29 DIAGNOSIS — Z23 Encounter for immunization: Secondary | ICD-10-CM | POA: Diagnosis not present

## 2021-10-29 DIAGNOSIS — I10 Essential (primary) hypertension: Secondary | ICD-10-CM | POA: Diagnosis not present

## 2021-10-29 DIAGNOSIS — G4733 Obstructive sleep apnea (adult) (pediatric): Secondary | ICD-10-CM | POA: Diagnosis not present

## 2021-10-29 DIAGNOSIS — R609 Edema, unspecified: Secondary | ICD-10-CM | POA: Diagnosis not present

## 2021-10-29 DIAGNOSIS — J449 Chronic obstructive pulmonary disease, unspecified: Secondary | ICD-10-CM | POA: Diagnosis not present

## 2021-10-29 DIAGNOSIS — I272 Pulmonary hypertension, unspecified: Secondary | ICD-10-CM

## 2021-10-29 DIAGNOSIS — R7303 Prediabetes: Secondary | ICD-10-CM

## 2021-10-29 LAB — RENAL FUNCTION PANEL
Albumin: 3.4 g/dL — ABNORMAL LOW (ref 3.5–5.2)
BUN: 25 mg/dL — ABNORMAL HIGH (ref 6–23)
CO2: 28 mEq/L (ref 19–32)
Calcium: 9.3 mg/dL (ref 8.4–10.5)
Chloride: 100 mEq/L (ref 96–112)
Creatinine, Ser: 1.15 mg/dL (ref 0.40–1.20)
GFR: 47.35 mL/min — ABNORMAL LOW (ref >60.00)
Glucose, Bld: 102 mg/dL — ABNORMAL HIGH (ref 70–99)
Phosphorus: 4.7 mg/dL — ABNORMAL HIGH (ref 2.3–4.6)
Potassium: 3.5 mEq/L (ref 3.5–5.1)
Sodium: 140 mEq/L (ref 135–145)

## 2021-10-29 LAB — CBC
HCT: 50.4 % — ABNORMAL HIGH (ref 36.0–46.0)
Hemoglobin: 16.9 g/dL — ABNORMAL HIGH (ref 12.0–15.0)
MCHC: 33.5 g/dL (ref 30.0–36.0)
MCV: 93.5 fl (ref 78.0–100.0)
Platelets: 183 10*3/uL (ref 150.0–400.0)
RBC: 5.39 Mil/uL — ABNORMAL HIGH (ref 3.87–5.11)
RDW: 16 % — ABNORMAL HIGH (ref 11.5–15.5)
WBC: 9.7 10*3/uL (ref 4.0–10.5)

## 2021-10-29 LAB — HEPATIC FUNCTION PANEL
ALT: 16 U/L (ref 0–35)
AST: 23 U/L (ref 0–37)
Albumin: 3.4 g/dL — ABNORMAL LOW (ref 3.5–5.2)
Alkaline Phosphatase: 86 U/L (ref 39–117)
Bilirubin, Direct: 0.2 mg/dL (ref 0.0–0.3)
Total Bilirubin: 0.9 mg/dL (ref 0.2–1.2)
Total Protein: 6.5 g/dL (ref 6.0–8.3)

## 2021-10-29 LAB — LIPID PANEL
Cholesterol: 136 mg/dL (ref 0–200)
HDL: 27.9 mg/dL — ABNORMAL LOW (ref >39.00)
LDL Cholesterol: 78 mg/dL (ref 0–99)
NonHDL: 108.12
Total CHOL/HDL Ratio: 5
Triglycerides: 151 mg/dL — ABNORMAL HIGH (ref 0.0–149.0)
VLDL: 30.2 mg/dL (ref 0.0–40.0)

## 2021-10-29 LAB — HEMOGLOBIN A1C: Hgb A1c MFr Bld: 7.2 % — ABNORMAL HIGH (ref 4.6–6.5)

## 2021-10-29 MED ORDER — SILVER SULFADIAZINE 1 % EX CREA: 1.0000 "application " | TOPICAL_CREAM | Freq: Every day | CUTANEOUS | 0 refills | Status: DC

## 2021-10-29 NOTE — Progress Notes (Signed)
Subjective:    Patient ID: Kari Medina, female    DOB: July 03, 1946, 74 y.o.   MRN: [id number]  HPI Here with husband for follow up of edema and new pulmonary hypertension diagnosis This visit occurred during the SARS-CoV-2 public health emergency.  Safety protocols were in place, including screening questions prior to the visit, additional usage of staff PPE, and extensive cleaning of exam room while observing appropriate contact time as indicated for disinfecting solutions.   Feels the leg edema may be slightly better---but persists Has non healing spot on anterior right calf Hasn't been monitoring weight  Does have urinary urgency with the torsemide Incontinent  Breathing may be some better when she takes the torsemide Has been cutting it sometimes--like if she goes out (1-2 per week)  Worried about metformin Sporadically checks---usually around 110 at most  Current Outpatient Medications on File Prior to Visit  Medication Sig Dispense Refill   acetaminophen (TYLENOL) 650 MG CR tablet Take 650 mg by mouth 3 (three) times daily.     albuterol (PROVENTIL HFA) 108 (90 Base) MCG/ACT inhaler Inhale 2 puffs into the lungs every 6 (six) hours as needed for wheezing or shortness of breath. 18 g 2   aspirin EC 81 MG EC tablet Take 1 tablet (81 mg total) by mouth daily. 30 tablet 0   budesonide-formoterol (SYMBICORT) 160-4.5 MCG/ACT inhaler Inhale 2 puffs into the lungs 2 (two) times daily.     calcium carbonate (OS-CAL) 600 MG TABS Take 600 mg by mouth daily with breakfast.     cholecalciferol (VITAMIN D) 1000 units tablet Take 2,000 Units by mouth 2 (two) times daily.     DULoxetine (CYMBALTA) 60 MG capsule TAKE 1 CAPSULE BY MOUTH  DAILY 90 capsule 3   fluticasone (FLONASE) 50 MCG/ACT nasal spray Place 1 spray into both nostrils daily as needed. For stuffy nose  3   glucose blood (ONETOUCH VERIO) test strip Use to check blood sugar once a day. E11.9 100 each 3    hydrochlorothiazide (HYDRODIURIL) 12.5 MG tablet TAKE 1 TABLET BY MOUTH  DAILY 90 tablet 3   levothyroxine (SYNTHROID) 200 MCG tablet TAKE 1 TABLET BY MOUTH  DAILY BEFORE BREAKFAST 90 tablet 3   Magnesium 500 MG TABS Take 100 mg by mouth 2 (two) times daily.     metFORMIN (GLUCOPHAGE-XR) 500 MG 24 hr tablet TAKE 1 TABLET BY MOUTH  DAILY WITH BREAKFAST 90 tablet 3   montelukast (SINGULAIR) 10 MG tablet TAKE 1 TABLET BY MOUTH AT  BEDTIME 90 tablet 3   ONETOUCH DELICA LANCETS FINE MISC 1 each by Does not apply route daily. Use to check blood sugar once a day. E11.9 100 each 3   potassium chloride (K-DUR) 10 MEQ tablet Take 1 tablet (10 mEq total) by mouth daily. 30 tablet 0   propranolol (INDERAL) 40 MG tablet TAKE 1 TABLET BY MOUTH  TWICE DAILY 180 tablet 3   rOPINIRole (REQUIP) 3 MG tablet TAKE ONE TABLET BY MOUTH AT BEDTIME (Patient taking differently: 2 mg.) 90 tablet 3   torsemide (DEMADEX) 20 MG tablet Take 1 tablet (20 mg total) by mouth daily as needed. 30 tablet 3   Turmeric Curcumin 500 MG CAPS Take 500 mg by mouth at bedtime.      vitamin C (ASCORBIC ACID) 500 MG tablet Take 500 mg by mouth daily.     VITAMIN E PO Take by mouth.     No current facility-administered medications on file prior to visit.  Allergies  Allergen Reactions   Latex Hives    Tape and bandaids only   Nickel Hives   Pramipexole Other (See Comments)    Caused hallucinations, says he can take name brand.   Prednisone     Makes her "feel crazy"   Topiramate Other (See Comments)    "spaced out"   Citalopram Anxiety   Paroxetine Hcl Anxiety   Sertraline Anxiety    Past Medical History:  Diagnosis Date   Asthma    Chronic hypoxemic respiratory failure ([place])    uses O2 with exertion and with CPAP   COPD (chronic obstructive pulmonary disease) ([place])    Depression    Diabetes mellitus without complication (HCC)    Dyspnea    doe   Dysrhythmia    extra beat   Fatty liver    Fibromyalgia     Generalized osteoarthritis of multiple sites    History of hiatal hernia    Hypertension    Hypothyroidism    Melanoma ([place]) 07/2018   Right leg   OSA (obstructive sleep apnea)    Pain    chronic ruq and back pain   Panic attacks    PONV (postoperative nausea and vomiting)    after thyroidectomy   Raynaud disease    RLS (restless legs syndrome)    Spleen absent    TOLD ABSENT THEN TOLD DOES HAVE SPLEEN. PATIENT IS UNCERTAIN   Tremor, essential    Wears dentures    full upper and lower    Past Surgical History:  Procedure Laterality Date   CATARACT EXTRACTION W/PHACO Right 03/04/2021   Procedure: CATARACT EXTRACTION PHACO AND INTRAOCULAR LENS PLACEMENT ([facility]) RIGHT DIABETIC 3.98 00:33.2;  Surgeon: Eulogio Bear, MD;  Location: [facility];  Service: Ophthalmology;  Laterality: Right;  Diabetic - oral meds   CATARACT EXTRACTION W/PHACO Left 03/18/2021   Procedure: CATARACT EXTRACTION PHACO AND INTRAOCULAR LENS PLACEMENT ([place]) LEFT;  Surgeon: Eulogio Bear, MD;  Location: [facility];  Service: Ophthalmology;  Laterality: Left;  2.96 0:27.9   CHOLECYSTECTOMY     DILATATION & CURETTAGE/HYSTEROSCOPY WITH MYOSURE N/A 11/10/2018   Procedure: DILATATION & CURETTAGE/HYSTEROSCOPY WITH MYOSURE/MYOMECTOMY;  Surgeon: Aletha Halim, MD;  Location: [facility];  Service: Gynecology;  Laterality: N/A;  possible myosure.  Please use myosure scope, do not open myosure blades but have in the room   JOINT REPLACEMENT Bilateral    2008/2011   MELANOMA EXCISION  07/2018   REVERSE SHOULDER ARTHROPLASTY Right 05/28/2017   Procedure: REVERSE SHOULDER ARTHROPLASTY;  Surgeon: Corky Mull, MD;  Location: [facility];  Service: Orthopedics;  Laterality: Right;   RHINOPLASTY  1972   THYROIDECTOMY  2006   TOTAL KNEE ARTHROPLASTY     bilateral    Family History  Problem Relation Age of Onset   Osteoarthritis Mother    Diabetes Mother    Cirrhosis Mother    Cancer  Father    Kidney cancer Father    Bladder Cancer Father    Heart disease Brother        stents in 1 brother   Breast cancer Neg Hx     Social History   Socioeconomic History   Marital status: Married    Spouse name: Not on file   Number of children: 1   Years of education: Not on file   Highest education level: Not on file  Occupational History   Occupation: Neurosurgeon    Comment: Retired  Tobacco  Use   Smoking status: Former    Types: Cigarettes    Quit date: 12/22/1988    Years since quitting: 32.8   Smokeless tobacco: Never  Vaping Use   Vaping Use: Never used  Substance and Sexual Activity   Alcohol use: No   Drug use: No   Sexual activity: Not on file  Other Topics Concern   Not on file  Social History Narrative   1 daughter      Has living will   Husband has health care POA. Alternate would be daughter Judson Roch   Would allow resuscitation but no prolonged machines   Not sure about feeding tubes   Social Determinants of Health   Financial Resource Strain: Not on file  Food Insecurity: Not on file  Transportation Needs: Not on file  Physical Activity: Not on file  Stress: Not on file  Social Connections: Not on file  Intimate Partner Violence: Not on file   Review of Systems No specific Rx for pulmonary HTN---did try trelegy (but symbicort is better) Neb Rx helps her clear mucus, etc    Objective:   Physical Exam Constitutional:      Appearance: Normal appearance.  Cardiovascular:     Rate and Rhythm: Normal rate and regular rhythm.     Heart sounds: No murmur heard.   No gallop.  Pulmonary:     Effort: Pulmonary effort is normal.     Breath sounds: Normal breath sounds. No wheezing or rales.  Musculoskeletal:     Cervical back: Normal range of motion and neck supple.     Comments: Still with tense calves--no pitting in feet  Skin:    Comments: 2 small granulated ulcers on anterior right calf  Neurological:     Mental Status: She is  alert.  Psychiatric:        Mood and Affect: Mood normal.        Behavior: Behavior normal.           Assessment & Plan:

## 2021-10-29 NOTE — Assessment & Plan Note (Signed)
Some better with the torsemide Is on potassium Will continue this for now--discussed going up on dose but I am concerned about over diuresis Will check labs

## 2021-10-29 NOTE — Assessment & Plan Note (Signed)
BP Readings from Last 3 Encounters:  10/29/21 118/80  10/02/21 (!) 89/61  09/10/21 118/72   On prolpranolol

## 2021-10-29 NOTE — Assessment & Plan Note (Signed)
No problems with the metformin--but she is concerned about potential side effects Will stop if labs look good today

## 2021-10-29 NOTE — Assessment & Plan Note (Signed)
She thinks the symbicort works better than the trelegy

## 2021-10-29 NOTE — Assessment & Plan Note (Signed)
Uses the CPAP daily--but still not awakening refreshing

## 2021-10-29 NOTE — Assessment & Plan Note (Signed)
Going back to Dr Kari Medina if he is considering specific Rx for pulmonary HTN

## 2021-10-29 NOTE — Assessment & Plan Note (Signed)
No infection Will Rx silvadene they can use if they open up

## 2021-11-01 DIAGNOSIS — R0609 Other forms of dyspnea: Secondary | ICD-10-CM | POA: Diagnosis not present

## 2021-11-01 DIAGNOSIS — I272 Pulmonary hypertension, unspecified: Secondary | ICD-10-CM | POA: Diagnosis not present

## 2021-11-01 DIAGNOSIS — Z9981 Dependence on supplemental oxygen: Secondary | ICD-10-CM | POA: Diagnosis not present

## 2021-11-01 DIAGNOSIS — G4733 Obstructive sleep apnea (adult) (pediatric): Secondary | ICD-10-CM | POA: Diagnosis not present

## 2021-11-05 ENCOUNTER — Telehealth: Payer: Self-pay

## 2021-11-05 DIAGNOSIS — Z1231 Encounter for screening mammogram for malignant neoplasm of breast: Secondary | ICD-10-CM

## 2021-11-05 NOTE — Telephone Encounter (Addendum)
Spoke to pt. Says she is not anxious everyday. But when she has increased dyspnea, she does get anxious. Would not be taking tranquilizer every day.

## 2021-11-05 NOTE — Telephone Encounter (Signed)
Asking if HCTZ to a different BP med since she is on torsemide. She mentioned amlodipine because she was on it in the past. I advised amlodipine can cause swelling.   Needs mammogram ordered at [facility]. She will call and schedule.  Having anxiety due to her breathing issues. Asking if she can get something for that.

## 2021-11-06 MED ORDER — ALPRAZOLAM 0.25 MG PO TABS: 0.2500 mg | ORAL_TABLET | Freq: Two times a day (BID) | ORAL | 0 refills | Status: DC | PRN

## 2021-11-06 NOTE — Telephone Encounter (Signed)
Okay---I sent alprazolam Rx Let her know that I would hope the #30 would last at least 2 months

## 2021-11-06 NOTE — Telephone Encounter (Signed)
Left message on verified VM.

## 2021-11-18 DIAGNOSIS — R0602 Shortness of breath: Secondary | ICD-10-CM | POA: Diagnosis not present

## 2021-11-18 DIAGNOSIS — I10 Essential (primary) hypertension: Secondary | ICD-10-CM | POA: Diagnosis not present

## 2021-11-18 DIAGNOSIS — G4730 Sleep apnea, unspecified: Secondary | ICD-10-CM | POA: Diagnosis not present

## 2021-11-19 ENCOUNTER — Inpatient Hospital Stay
Admission: EM | Admit: 2021-11-19 | Discharge: 2021-11-26 | DRG: 183 | Disposition: A | Discharge status: Home Health | Payer: Medicare Other | Attending: Internal Medicine | Admitting: Internal Medicine

## 2021-11-19 ENCOUNTER — Other Ambulatory Visit: Payer: Self-pay

## 2021-11-19 ENCOUNTER — Emergency Department: Payer: Medicare Other

## 2021-11-19 DIAGNOSIS — I130 Hypertensive heart and chronic kidney disease with heart failure and stage 1 through stage 4 chronic kidney disease, or unspecified chronic kidney disease: Secondary | ICD-10-CM | POA: Diagnosis present

## 2021-11-19 DIAGNOSIS — G2581 Restless legs syndrome: Secondary | ICD-10-CM | POA: Diagnosis present

## 2021-11-19 DIAGNOSIS — I270 Primary pulmonary hypertension: Secondary | ICD-10-CM | POA: Diagnosis not present

## 2021-11-19 DIAGNOSIS — I2729 Other secondary pulmonary hypertension: Secondary | ICD-10-CM | POA: Diagnosis present

## 2021-11-19 DIAGNOSIS — G4733 Obstructive sleep apnea (adult) (pediatric): Secondary | ICD-10-CM | POA: Diagnosis present

## 2021-11-19 DIAGNOSIS — E1122 Type 2 diabetes mellitus with diabetic chronic kidney disease: Secondary | ICD-10-CM | POA: Diagnosis present

## 2021-11-19 DIAGNOSIS — Z79899 Other long term (current) drug therapy: Secondary | ICD-10-CM

## 2021-11-19 DIAGNOSIS — Z79890 Hormone replacement therapy (postmenopausal): Secondary | ICD-10-CM

## 2021-11-19 DIAGNOSIS — G250 Essential tremor: Secondary | ICD-10-CM | POA: Diagnosis present

## 2021-11-19 DIAGNOSIS — Z96611 Presence of right artificial shoulder joint: Secondary | ICD-10-CM | POA: Diagnosis present

## 2021-11-19 DIAGNOSIS — I5033 Acute on chronic diastolic (congestive) heart failure: Secondary | ICD-10-CM | POA: Diagnosis present

## 2021-11-19 DIAGNOSIS — Z87891 Personal history of nicotine dependence: Secondary | ICD-10-CM

## 2021-11-19 DIAGNOSIS — E039 Hypothyroidism, unspecified: Secondary | ICD-10-CM | POA: Diagnosis present

## 2021-11-19 DIAGNOSIS — I272 Pulmonary hypertension, unspecified: Secondary | ICD-10-CM | POA: Diagnosis not present

## 2021-11-19 DIAGNOSIS — J9621 Acute and chronic respiratory failure with hypoxia: Secondary | ICD-10-CM

## 2021-11-19 DIAGNOSIS — I959 Hypotension, unspecified: Secondary | ICD-10-CM | POA: Diagnosis present

## 2021-11-19 DIAGNOSIS — Y92002 Bathroom of unspecified non-institutional (private) residence single-family (private) house as the place of occurrence of the external cause: Secondary | ICD-10-CM

## 2021-11-19 DIAGNOSIS — Z8249 Family history of ischemic heart disease and other diseases of the circulatory system: Secondary | ICD-10-CM

## 2021-11-19 DIAGNOSIS — S2242XA Multiple fractures of ribs, left side, initial encounter for closed fracture: Secondary | ICD-10-CM | POA: Diagnosis present

## 2021-11-19 DIAGNOSIS — M797 Fibromyalgia: Secondary | ICD-10-CM | POA: Diagnosis present

## 2021-11-19 DIAGNOSIS — R001 Bradycardia, unspecified: Secondary | ICD-10-CM | POA: Diagnosis not present

## 2021-11-19 DIAGNOSIS — I7121 Aneurysm of the ascending aorta, without rupture: Secondary | ICD-10-CM | POA: Diagnosis not present

## 2021-11-19 DIAGNOSIS — J449 Chronic obstructive pulmonary disease, unspecified: Secondary | ICD-10-CM | POA: Diagnosis present

## 2021-11-19 DIAGNOSIS — I517 Cardiomegaly: Secondary | ICD-10-CM | POA: Diagnosis not present

## 2021-11-19 DIAGNOSIS — J9811 Atelectasis: Secondary | ICD-10-CM | POA: Diagnosis present

## 2021-11-19 DIAGNOSIS — N179 Acute kidney failure, unspecified: Secondary | ICD-10-CM | POA: Diagnosis present

## 2021-11-19 DIAGNOSIS — K760 Fatty (change of) liver, not elsewhere classified: Secondary | ICD-10-CM | POA: Diagnosis present

## 2021-11-19 DIAGNOSIS — R0902 Hypoxemia: Secondary | ICD-10-CM | POA: Diagnosis not present

## 2021-11-19 DIAGNOSIS — Z7984 Long term (current) use of oral hypoglycemic drugs: Secondary | ICD-10-CM

## 2021-11-19 DIAGNOSIS — I10 Essential (primary) hypertension: Secondary | ICD-10-CM | POA: Diagnosis not present

## 2021-11-19 DIAGNOSIS — R531 Weakness: Secondary | ICD-10-CM

## 2021-11-19 DIAGNOSIS — E063 Autoimmune thyroiditis: Secondary | ICD-10-CM | POA: Diagnosis present

## 2021-11-19 DIAGNOSIS — Z85820 Personal history of malignant melanoma of skin: Secondary | ICD-10-CM

## 2021-11-19 DIAGNOSIS — Z6841 Body Mass Index (BMI) 40.0 and over, adult: Secondary | ICD-10-CM

## 2021-11-19 DIAGNOSIS — R0602 Shortness of breath: Secondary | ICD-10-CM | POA: Diagnosis not present

## 2021-11-19 DIAGNOSIS — E785 Hyperlipidemia, unspecified: Secondary | ICD-10-CM | POA: Diagnosis present

## 2021-11-19 DIAGNOSIS — F410 Panic disorder [episodic paroxysmal anxiety] without agoraphobia: Secondary | ICD-10-CM | POA: Diagnosis present

## 2021-11-19 DIAGNOSIS — R52 Pain, unspecified: Secondary | ICD-10-CM | POA: Diagnosis not present

## 2021-11-19 DIAGNOSIS — I7300 Raynaud's syndrome without gangrene: Secondary | ICD-10-CM | POA: Diagnosis present

## 2021-11-19 DIAGNOSIS — R0689 Other abnormalities of breathing: Secondary | ICD-10-CM | POA: Diagnosis not present

## 2021-11-19 DIAGNOSIS — I712 Thoracic aortic aneurysm, without rupture, unspecified: Secondary | ICD-10-CM | POA: Diagnosis present

## 2021-11-19 DIAGNOSIS — Z91040 Latex allergy status: Secondary | ICD-10-CM

## 2021-11-19 DIAGNOSIS — Z9981 Dependence on supplemental oxygen: Secondary | ICD-10-CM

## 2021-11-19 DIAGNOSIS — Z7982 Long term (current) use of aspirin: Secondary | ICD-10-CM

## 2021-11-19 DIAGNOSIS — E890 Postprocedural hypothyroidism: Secondary | ICD-10-CM | POA: Diagnosis present

## 2021-11-19 DIAGNOSIS — I2781 Cor pulmonale (chronic): Secondary | ICD-10-CM

## 2021-11-19 DIAGNOSIS — Z20822 Contact with and (suspected) exposure to covid-19: Secondary | ICD-10-CM | POA: Diagnosis present

## 2021-11-19 DIAGNOSIS — Z888 Allergy status to other drugs, medicaments and biological substances status: Secondary | ICD-10-CM

## 2021-11-19 DIAGNOSIS — N1831 Chronic kidney disease, stage 3a: Secondary | ICD-10-CM | POA: Diagnosis present

## 2021-11-19 DIAGNOSIS — F329 Major depressive disorder, single episode, unspecified: Secondary | ICD-10-CM | POA: Diagnosis present

## 2021-11-19 DIAGNOSIS — W182XXA Fall in (into) shower or empty bathtub, initial encounter: Secondary | ICD-10-CM | POA: Diagnosis present

## 2021-11-19 DIAGNOSIS — Z7951 Long term (current) use of inhaled steroids: Secondary | ICD-10-CM

## 2021-11-19 DIAGNOSIS — Z96653 Presence of artificial knee joint, bilateral: Secondary | ICD-10-CM | POA: Diagnosis present

## 2021-11-19 DIAGNOSIS — M159 Polyosteoarthritis, unspecified: Secondary | ICD-10-CM | POA: Diagnosis present

## 2021-11-19 DIAGNOSIS — Z91048 Other nonmedicinal substance allergy status: Secondary | ICD-10-CM

## 2021-11-19 DIAGNOSIS — J441 Chronic obstructive pulmonary disease with (acute) exacerbation: Secondary | ICD-10-CM | POA: Diagnosis not present

## 2021-11-19 DIAGNOSIS — Z972 Presence of dental prosthetic device (complete) (partial): Secondary | ICD-10-CM

## 2021-11-19 LAB — HEPATIC FUNCTION PANEL
ALT: 19 U/L (ref 0–44)
AST: 32 U/L (ref 15–41)
Albumin: 3.5 g/dL (ref 3.5–5.0)
Alkaline Phosphatase: 75 U/L (ref 38–126)
Bilirubin, Direct: 0.3 mg/dL — ABNORMAL HIGH (ref 0.0–0.2)
Indirect Bilirubin: 1.3 mg/dL — ABNORMAL HIGH (ref 0.3–0.9)
Total Bilirubin: 1.6 mg/dL — ABNORMAL HIGH (ref 0.3–1.2)
Total Protein: 6.9 g/dL (ref 6.5–8.1)

## 2021-11-19 LAB — CBC
HCT: 48 % — ABNORMAL HIGH (ref 36.0–46.0)
Hemoglobin: 16.1 g/dL — ABNORMAL HIGH (ref 12.0–15.0)
MCH: 31.4 pg (ref 26.0–34.0)
MCHC: 33.5 g/dL (ref 30.0–36.0)
MCV: 93.8 fL (ref 80.0–100.0)
Platelets: 172 10*3/uL (ref 150–400)
RBC: 5.12 MIL/uL — ABNORMAL HIGH (ref 3.87–5.11)
RDW: 17.5 % — ABNORMAL HIGH (ref 11.5–15.5)
WBC: 11.9 10*3/uL — ABNORMAL HIGH (ref 4.0–10.5)
nRBC: 0.3 % — ABNORMAL HIGH (ref 0.0–0.2)

## 2021-11-19 LAB — BASIC METABOLIC PANEL
Anion gap: 8 (ref 5–15)
BUN: 22 mg/dL (ref 8–23)
CO2: 28 mmol/L (ref 22–32)
Calcium: 9.2 mg/dL (ref 8.9–10.3)
Chloride: 102 mmol/L (ref 98–111)
Creatinine, Ser: 1.15 mg/dL — ABNORMAL HIGH (ref 0.44–1.00)
GFR, Estimated: 50 mL/min — ABNORMAL LOW (ref >60)
Glucose, Bld: 103 mg/dL — ABNORMAL HIGH (ref 70–99)
Potassium: 3.5 mmol/L (ref 3.5–5.1)
Sodium: 138 mmol/L (ref 135–145)

## 2021-11-19 LAB — TROPONIN I (HIGH SENSITIVITY): Troponin I (High Sensitivity): 23 ng/L — ABNORMAL HIGH (ref <18)

## 2021-11-19 LAB — BRAIN NATRIURETIC PEPTIDE: B Natriuretic Peptide: 1132.6 pg/mL — ABNORMAL HIGH (ref 0.0–100.0)

## 2021-11-19 MED ORDER — FUROSEMIDE 10 MG/ML IJ SOLN
40.0000 mg | Freq: Once | INTRAMUSCULAR | Status: DC
  Filled 2021-11-19: qty 4

## 2021-11-19 MED ORDER — IOHEXOL 350 MG/ML SOLN
100.0000 mL | Freq: Once | INTRAVENOUS | Status: AC | PRN
  Administered 2021-11-19: 100 mL via INTRAVENOUS

## 2021-11-19 MED ORDER — MORPHINE SULFATE (PF) 4 MG/ML IV SOLN
4.0000 mg | Freq: Once | INTRAVENOUS | Status: AC
  Administered 2021-11-19: 4 mg via INTRAVENOUS
  Filled 2021-11-19: qty 1

## 2021-11-19 NOTE — ED Triage Notes (Signed)
Pt reports fall today around lunch time, unsure how long she was on the floor. States lost her balance. Reports wearing o2 at night, was 77-86% on RA with EMS, placed on 4L [place]. States she is shob but that is "normal"  Endorses pain to left side of back.  Denies hitting head with fall, no blood thinners.   States has been sick recently with pulmonary hypertension

## 2021-11-19 NOTE — ED Provider Notes (Signed)
[facility]  ____________________________________________   Event Date/Time   First MD Initiated Contact with Patient 11/19/21 2032     (approximate)  I have reviewed the triage vital signs and the nursing notes.   HISTORY  Chief Complaint Fall and Shortness of Breath    HPI Kari Medina is a 75 y.o. female with past medical history of asthma/COPD chronic hypoxic respiratory on 2 L nasal cannula at baseline, questionable pulmonary hypertension but has not yet had a right heart cath who presents with shortness of breath and a fall.  Patient tells me she has been feeling generally weak and more fatigued with worsening shortness of breath.  She has chronic lower extremity edema is on Lasix for this.  She just saw Dr. Kerri Perches yesterday with cardiology and is scheduled for right heart cath due to concern for pulmonary hypertension.  Patient does endorse a cough that is productive and some intermittent chest pain.  Today she had a fall out of the shower.  She has had difficulty getting out due to weakness lifting up her legs.  She slipped today and fell onto the commode hitting the left side of her chest.  She has pain in the left side of her chest.  Denies hitting her head no headache or neck pain.         Past Medical History:  Diagnosis Date   Asthma    Chronic hypoxemic respiratory failure (HCC)    uses O2 with exertion and with CPAP   COPD (chronic obstructive pulmonary disease) (HCC)    Depression    Diabetes mellitus without complication (HCC)    Dyspnea    doe   Dysrhythmia    extra beat   Fatty liver    Fibromyalgia    Generalized osteoarthritis of multiple sites    History of hiatal hernia    Hypertension    Hypothyroidism    Melanoma ([place]) 07/2018   Right leg   OSA (obstructive sleep apnea)    Pain    chronic ruq and back pain   Panic attacks    PONV (postoperative nausea and vomiting)    after thyroidectomy   Raynaud disease     RLS (restless legs syndrome)    Spleen absent    TOLD ABSENT THEN TOLD DOES HAVE SPLEEN. PATIENT IS UNCERTAIN   Tremor, essential    Wears dentures    full upper and lower    Patient Active Problem List   Diagnosis Date Noted   Acute on chronic respiratory failure with hypoxia ([place]) 11/19/2021   Chronic kidney disease, stage 3a ([place]) 11/19/2021   Multiple fractures of ribs, left side, initial encounter for closed fracture 11/19/2021   Cor pulmonale ([place]) 11/19/2021   Edema 09/10/2021   Pulmonary hypertension ([place]) 09/10/2021   Venous stasis ulcer of right calf ([place]) 09/10/2021   Aortic atherosclerosis ([place]) 01/11/2021   Hashimoto's thyroiditis 01/10/2021   Hypothyroidism 07/06/2020   Chronic hypoxemic respiratory failure ([place]) 07/06/2020   Prediabetes 09/06/2018   History of melanoma 07/22/2018   Aneurysm of thoracic aorta 04/07/2018   Mood disorder ([place]) 03/02/2018   Status post total bilateral knee replacement 12/03/2017   Preventative health care 08/20/2017   Urinary incontinence 08/20/2017   Advance directive discussed with patient 08/20/2017   Status post reverse total shoulder replacement, right 05/28/2017   Obesity, Class III, BMI 40-49.9 (morbid obesity) ([place]) 05/22/2016   COPD (chronic obstructive pulmonary disease) ([place]) 04/16/2016   Generalized weakness 04/16/2016  Hypertension    Tremor, essential    OSA (obstructive sleep apnea)    RLS (restless legs syndrome)    Raynaud disease    Osteoarthrosis, generalized, multiple joints     Past Surgical History:  Procedure Laterality Date   CATARACT EXTRACTION W/PHACO Right 03/04/2021   Procedure: CATARACT EXTRACTION PHACO AND INTRAOCULAR LENS PLACEMENT (IOC) RIGHT DIABETIC 3.98 00:33.2;  Surgeon: Eulogio Bear, MD;  Location: [place];  Service: Ophthalmology;  Laterality: Right;  Diabetic - oral meds   CATARACT EXTRACTION W/PHACO Left 03/18/2021   Procedure: CATARACT EXTRACTION PHACO AND INTRAOCULAR  LENS PLACEMENT ([place]) LEFT;  Surgeon: Eulogio Bear, MD;  Location: [place];  Service: Ophthalmology;  Laterality: Left;  2.96 0:27.9   CHOLECYSTECTOMY     DILATATION & CURETTAGE/HYSTEROSCOPY WITH MYOSURE N/A 11/10/2018   Procedure: DILATATION & CURETTAGE/HYSTEROSCOPY WITH MYOSURE/MYOMECTOMY;  Surgeon: Aletha Halim, MD;  Location: [place];  Service: Gynecology;  Laterality: N/A;  possible myosure.  Please use myosure scope, do not open myosure blades but have in the room   JOINT REPLACEMENT Bilateral    2008/2011   MELANOMA EXCISION  07/2018   REVERSE SHOULDER ARTHROPLASTY Right 05/28/2017   Procedure: REVERSE SHOULDER ARTHROPLASTY;  Surgeon: Corky Mull, MD;  Location: [facility];  Service: Orthopedics;  Laterality: Right;   RHINOPLASTY  1972   THYROIDECTOMY  2006   TOTAL KNEE ARTHROPLASTY     bilateral    Prior to Admission medications   Medication Sig Start Date End Date Taking? Authorizing Provider  acetaminophen (TYLENOL) 650 MG CR tablet Take 650 mg by mouth 3 (three) times daily.    [provider]  albuterol (PROVENTIL HFA) 108 (90 Base) MCG/ACT inhaler Inhale 2 puffs into the lungs every 6 (six) hours as needed for wheezing or shortness of breath. 01/06/20   Venia Carbon, MD  ALPRAZolam Duanne Moron) 0.25 MG tablet Take 1 tablet (0.25 mg total) by mouth 2 (two) times daily as needed for anxiety. 11/06/21   Venia Carbon, MD  aspirin EC 81 MG EC tablet Take 1 tablet (81 mg total) by mouth daily. 10/26/18   Vaughan Basta, MD  budesonide-formoterol (SYMBICORT) 160-4.5 MCG/ACT inhaler Inhale 2 puffs into the lungs 2 (two) times daily. 06/26/20   [provider]  calcium carbonate (OS-CAL) 600 MG TABS Take 600 mg by mouth daily with breakfast.    [provider]  cholecalciferol (VITAMIN D) 1000 units tablet Take 2,000 Units by mouth 2 (two) times daily.    [provider]  DULoxetine (CYMBALTA) 60 MG capsule  TAKE 1 CAPSULE BY MOUTH  DAILY 01/30/21   Venia Carbon, MD  fluticasone (FLONASE) 50 MCG/ACT nasal spray Place 1 spray into both nostrils daily as needed. For stuffy nose 04/02/17   [provider]  glucose blood (ONETOUCH VERIO) test strip Use to check blood sugar once a day. E11.9 09/30/18   [name] I, MD  hydrochlorothiazide (HYDRODIURIL) 12.5 MG tablet TAKE 1 TABLET BY MOUTH  DAILY 01/09/21   [name] I, MD  levothyroxine (SYNTHROID) 200 MCG tablet TAKE 1 TABLET BY MOUTH  DAILY BEFORE BREAKFAST 05/09/21   Shamleffer, Melanie Crazier, MD  Magnesium 500 MG TABS Take 100 mg by mouth 2 (two) times daily.    [provider]  metFORMIN (GLUCOPHAGE-XR) 500 MG 24 hr tablet TAKE 1 TABLET BY MOUTH  DAILY WITH BREAKFAST 01/09/21   [name] I, MD  montelukast (SINGULAIR) 10 MG tablet TAKE  1 TABLET BY MOUTH AT  BEDTIME 01/09/21   Venia Carbon, MD  [facility] DELICA LANCETS FINE MISC 1 each by Does not apply route daily. Use to check blood sugar once a day. E11.9 09/30/18   [name] I, MD  potassium chloride (K-DUR) 10 MEQ tablet Take 1 tablet (10 mEq total) by mouth daily. 10/25/18   Vaughan Basta, MD  propranolol (INDERAL) 40 MG tablet TAKE 1 TABLET BY MOUTH  TWICE DAILY 01/09/21   [name] I, MD  rOPINIRole (REQUIP) 3 MG tablet TAKE ONE TABLET BY MOUTH AT BEDTIME Patient taking differently: 2 mg. 12/17/20   Venia Carbon, MD  silver sulfADIAZINE (SILVADENE) 1 % cream Apply 1 application topically daily. 10/29/21   Venia Carbon, MD  torsemide (DEMADEX) 20 MG tablet Take 1 tablet (20 mg total) by mouth daily as needed. 09/10/21   Venia Carbon, MD  Turmeric Curcumin 500 MG CAPS Take 500 mg by mouth at bedtime.     [provider]  vitamin C (ASCORBIC ACID) 500 MG tablet Take 500 mg by mouth daily.    [provider]  VITAMIN E PO Take by mouth.    [provider]    Allergies Latex, Nickel, Pramipexole,  Prednisone, Topiramate, Citalopram, Paroxetine hcl, and Sertraline  Family History  Problem Relation Age of Onset   Osteoarthritis Mother    Diabetes Mother    Cirrhosis Mother    Cancer Father    Kidney cancer Father    Bladder Cancer Father    Heart disease Brother        stents in 1 brother   Breast cancer Neg Hx     Social History Social History   Tobacco Use   Smoking status: Former    Types: Cigarettes    Quit date: 12/22/1988    Years since quitting: 32.9   Smokeless tobacco: Never  Vaping Use   Vaping Use: Never used  Substance Use Topics   Alcohol use: No   Drug use: No    Review of Systems   Review of Systems  Constitutional:  Negative for chills and fever.  Respiratory:  Positive for cough and shortness of breath.   Cardiovascular:  Positive for chest pain.  Gastrointestinal:  Negative for abdominal pain, nausea and vomiting.  Genitourinary:  Negative for dysuria.  All other systems reviewed and are negative.  Physical Exam Updated Vital Signs BP 108/75   Pulse 85   Temp 98.1 F (36.7 C)   Resp (!) 23   Ht 5\' 4"  (1.626 m)   Wt 108.9 kg   SpO2 91%   BMI 41.20 kg/m   Physical Exam Vitals and nursing note reviewed.  Constitutional:      General: She is not in acute distress.    Appearance: Normal appearance.     Comments: Patient appears chronically unwell  HENT:     Head: Normocephalic and atraumatic.  Eyes:     General: No scleral icterus.    Conjunctiva/sclera: Conjunctivae normal.  Pulmonary:     Effort: Pulmonary effort is normal. Tachypnea present. No respiratory distress.     Breath sounds: No stridor.     Comments: Patient is tachypneic, able to speak in normal sentences, lungs are clear Chest:     Chest wall: Tenderness present.     Comments: Tenderness to palpation along the left lateral chest wall without bony crepitus or ecchymosis Abdominal:     Palpations: Abdomen is soft.  Tenderness: There is no abdominal tenderness.  There is no guarding.  Musculoskeletal:        General: No deformity or signs of injury.     Cervical back: Normal range of motion.     Right lower leg: Edema present.     Left lower leg: Edema present.     Comments: 2+ lower extremity edema in the bilateral lower extremities  Skin:    General: Skin is dry.     Coloration: Skin is not jaundiced or pale.  Neurological:     General: No focal deficit present.     Mental Status: She is alert and oriented to person, place, and time. Mental status is at baseline.  Psychiatric:        Mood and Affect: Mood normal.        Behavior: Behavior normal.     LABS (all labs ordered are listed, but only abnormal results are displayed)  Labs Reviewed  CBC - Abnormal; Notable for the following components:      Result Value   WBC 11.9 (*)    RBC 5.12 (*)    Hemoglobin 16.1 (*)    HCT 48.0 (*)    RDW 17.5 (*)    nRBC 0.3 (*)    All other components within normal limits  BASIC METABOLIC PANEL - Abnormal; Notable for the following components:   Glucose, Bld 103 (*)    Creatinine, Ser 1.15 (*)    GFR, Estimated 50 (*)    All other components within normal limits  BRAIN NATRIURETIC PEPTIDE - Abnormal; Notable for the following components:   B Natriuretic Peptide 1,132.6 (*)    All other components within normal limits  HEPATIC FUNCTION PANEL - Abnormal; Notable for the following components:   Total Bilirubin 1.6 (*)    Bilirubin, Direct 0.3 (*)    Indirect Bilirubin 1.3 (*)    All other components within normal limits  TROPONIN I (HIGH SENSITIVITY) - Abnormal; Notable for the following components:   Troponin I (High Sensitivity) 23 (*)    All other components within normal limits  RESPIRATORY PANEL BY PCR  CBC  CREATININE, SERUM   ____________________________________________  EKG  Normal sinus rhythm, right axis deviation, right bundle branch block, T wave inversions in the inferior and lateral  leads ____________________________________________  RADIOLOGY I, Madelin Headings, personally viewed and evaluated these images (plain radiographs) as part of my medical decision making, as well as reviewing the written report by the radiologist.  ED MD interpretation:  I reviewed the CXR which does not show any acute cardiopulmonary process      ____________________________________________   PROCEDURES  Procedure(s) performed (including Critical Care):  Procedures   ____________________________________________   INITIAL IMPRESSION / ASSESSMENT AND PLAN / ED COURSE   Patient is a 75 year old female with a history of COPD and chronic respiratory failure on 2 L nasal cannula at baseline who presents with acute on chronic shortness of breath and fatigue as well as a fall today.  Patient is hypoxic requiring 4 L nasal cannula today, usually 2 L.  She has had progressively worsening of her symptoms at home and is now feeling more weak and having difficulty getting out of the shower which caused her to fall today onto her left chest.  She has pain in the left chest.  On exam there is focal tenderness of the left lateral chest but no bony crepitus.  Her abdomen is benign.  Patient has clear lung signs but  is tachypneic.  She also appears volume overloaded with lower extremity edema.  Her chest x-ray does not show any obvious focal infiltrate or rib fractures.  Given the new hypoxia and the degree of tenderness on exam I did obtain a CT angio to evaluate for PE and further evaluate the ribs and lung parenchyma.  CT angio is negative for PE but does show acute 8, 9 and 10th rib fractures.  His labs are also notable for an elevated troponin to 23, EKG shows right bundle with some reciprocal ST depression and T wave inversion but no acute ischemic changes.  BNP is significantly elevated and this correlates with her exam.  Will admit for diuresis and pain  control. ____________________________________________   FINAL CLINICAL IMPRESSION(S) / ED DIAGNOSES  Final diagnoses:  Closed fracture of multiple ribs of left side, initial encounter     ED Discharge Orders     None        Note:  This document was prepared using Dragon voice recognition software and may include unintentional dictation errors.    Rada Hay, MD 11/20/21 [phone]

## 2021-11-20 ENCOUNTER — Encounter: Payer: Self-pay | Admitting: Internal Medicine

## 2021-11-20 ENCOUNTER — Other Ambulatory Visit: Payer: Self-pay

## 2021-11-20 ENCOUNTER — Inpatient Hospital Stay
Admit: 2021-11-20 | Discharge: 2021-11-20 | Disposition: A | Discharge status: Home / Self Care | Payer: Medicare Other | Attending: Cardiology | Admitting: Cardiology

## 2021-11-20 DIAGNOSIS — J9621 Acute and chronic respiratory failure with hypoxia: Secondary | ICD-10-CM

## 2021-11-20 LAB — RESPIRATORY PANEL BY PCR

## 2021-11-20 LAB — CBC
HCT: 46.8 % — ABNORMAL HIGH (ref 36.0–46.0)
Hemoglobin: 15.4 g/dL — ABNORMAL HIGH (ref 12.0–15.0)
MCH: 31.4 pg (ref 26.0–34.0)
MCHC: 32.9 g/dL (ref 30.0–36.0)
MCV: 95.5 fL (ref 80.0–100.0)
Platelets: 162 10*3/uL (ref 150–400)
RBC: 4.9 MIL/uL (ref 3.87–5.11)
RDW: 17.6 % — ABNORMAL HIGH (ref 11.5–15.5)
WBC: 12.1 10*3/uL — ABNORMAL HIGH (ref 4.0–10.5)
nRBC: 0.2 % (ref 0.0–0.2)

## 2021-11-20 LAB — RESP PANEL BY RT-PCR (FLU A&B, COVID) ARPGX2
Influenza A by PCR: NEGATIVE
Influenza B by PCR: NEGATIVE
SARS Coronavirus 2 by RT PCR: NEGATIVE

## 2021-11-20 LAB — ECHOCARDIOGRAM COMPLETE
Height: 64 in
S' Lateral: 1.9 cm
Weight: 3840 oz

## 2021-11-20 LAB — CREATININE, SERUM
Creatinine, Ser: 1.22 mg/dL — ABNORMAL HIGH (ref 0.44–1.00)
GFR, Estimated: 47 mL/min — ABNORMAL LOW (ref >60)

## 2021-11-20 MED ORDER — ACETAMINOPHEN 325 MG PO TABS: 650.0000 mg | ORAL_TABLET | ORAL | Status: DC | PRN

## 2021-11-20 MED ORDER — ALBUTEROL SULFATE HFA 108 (90 BASE) MCG/ACT IN AERS: 2.0000 | INHALATION_SPRAY | Freq: Four times a day (QID) | RESPIRATORY_TRACT | Status: DC | PRN

## 2021-11-20 MED ORDER — ALPRAZOLAM 0.25 MG PO TABS
0.2500 mg | ORAL_TABLET | Freq: Two times a day (BID) | ORAL | Status: DC | PRN
  Administered 2021-11-21 – 2021-11-22 (×2): 0.25 mg via ORAL
  Filled 2021-11-20 (×2): qty 1

## 2021-11-20 MED ORDER — SODIUM CHLORIDE 0.9% FLUSH: 3.0000 mL | INTRAVENOUS | Status: DC | PRN

## 2021-11-20 MED ORDER — ENOXAPARIN SODIUM 60 MG/0.6ML IJ SOSY
0.5000 mg/kg | PREFILLED_SYRINGE | INTRAMUSCULAR | Status: DC
  Administered 2021-11-20 – 2021-11-26 (×7): 55 mg via SUBCUTANEOUS
  Filled 2021-11-20 (×5): qty 0.6
  Filled 2021-11-20: qty 0.55
  Filled 2021-11-20: qty 0.6

## 2021-11-20 MED ORDER — HYDROCODONE-ACETAMINOPHEN 5-325 MG PO TABS
1.0000 | ORAL_TABLET | ORAL | Status: DC | PRN
  Administered 2021-11-20 – 2021-11-26 (×10): 1 via ORAL
  Filled 2021-11-20 (×10): qty 1

## 2021-11-20 MED ORDER — FUROSEMIDE 10 MG/ML IJ SOLN
40.0000 mg | Freq: Two times a day (BID) | INTRAMUSCULAR | Status: DC
  Administered 2021-11-20 (×2): 40 mg via INTRAVENOUS
  Filled 2021-11-20 (×3): qty 4

## 2021-11-20 MED ORDER — ORAL CARE MOUTH RINSE
15.0000 mL | Freq: Two times a day (BID) | OROMUCOSAL | Status: DC
  Administered 2021-11-20 – 2021-11-26 (×12): 15 mL via OROMUCOSAL

## 2021-11-20 MED ORDER — SODIUM CHLORIDE 0.9% FLUSH
3.0000 mL | Freq: Two times a day (BID) | INTRAVENOUS | Status: DC
  Administered 2021-11-20 – 2021-11-26 (×12): 3 mL via INTRAVENOUS

## 2021-11-20 MED ORDER — MORPHINE SULFATE (PF) 2 MG/ML IV SOLN
2.0000 mg | INTRAVENOUS | Status: DC | PRN
  Administered 2021-11-23 (×2): 2 mg via INTRAVENOUS
  Filled 2021-11-20 (×2): qty 1

## 2021-11-20 MED ORDER — ALBUTEROL SULFATE (2.5 MG/3ML) 0.083% IN NEBU: 2.5000 mg | INHALATION_SOLUTION | Freq: Four times a day (QID) | RESPIRATORY_TRACT | Status: DC | PRN

## 2021-11-20 MED ORDER — ONDANSETRON HCL 4 MG/2ML IJ SOLN: 4.0000 mg | Freq: Four times a day (QID) | INTRAMUSCULAR | Status: DC | PRN

## 2021-11-20 MED ORDER — SODIUM CHLORIDE 0.9 % IV SOLN: 250.0000 mL | INTRAVENOUS | Status: DC | PRN

## 2021-11-20 NOTE — ED Notes (Signed)
Patient moved to hospital bed for comfort.  External catheter placed.  Teaching preformed and instruction on use provided.  Patient requesting water at this time

## 2021-11-20 NOTE — H&P (Signed)
History and Physical    Kari Medina EPP:[id number] DOB: Oct 01, 1946 DOA: 11/19/2021  PCP: Venia Carbon, MD   Patient coming from: Home  I have personally briefly reviewed patient's relevant medical records in [place]  Chief Complaint: Weakness and fall  HPI: Kari Medina is a 75 y.o. female with medical history significant for COPD on home O2 at 2 L, class III obesity, CKD 3a, DM, thoracic aortic aneurysm, history of melanoma, pulmonary hypertension scheduled for right heart cath in December presents to the ED with a several month history of gradually worsening shortness of breath , lower extremity edema and generalized weakness  leading to a fall in the shower on the day of arrival, hitting her left chest.  She did not hit her head or lose consciousness. Had a fall a week prior without injury. States she saw her cardiologist just the day prior.  She has been taking torsemide but with minimal improvement so plan is for right heart cath.  She has a morning cough that is productive of green phlegm but denies fever or chills.  She has no nausea, vomiting, abdominal pain or diarrhea  ED course: Tachypneic to 28 and requiring 4 L to maintain sats 91-93.  blood work troponin 23 and BNP 1132, total bilirubin 1.6 Creatinine 1.15 at baseline WBC 12,000 with hemoglobin 16 COVID and flu pending  EKG, personally viewed and interpreted: NSR at 72 with diffuse T wave inversion  CTA chest 1.acute rib fracture left 7, 8, 9, 10 2.stable pulmonary artery enlargement 3.stable 4.2 cm aneurysmal dilatation of the ascending thoracic aorta  Chest x-ray showed bilateral airspace disease  Patient treated with IV Lasix.  Hospitalist consulted for admission     Review of Systems: As per HPI otherwise all other systems on review of systems negative.   Assessment/Plan    Acute on chronic respiratory failure with hypoxia (HCC) -Multifactorial and related to acute rib  fractures, possible viral lung infection seen on chest x-ray though not on CT, Suspected cor pulmonale and underlying chronic pulmonary disease - Patient needing 4 L above her baseline of 2 L to keep sats in the low 90s - Continue to titrate oxygen to keep sats over 90 and treat potential etiologies    Multiple fractures of ribs, left side, initial encounter for closed fracture - Pain control, incentive spirometry as tolerated - Supplemental oxygen  Cor pulmonale, acute on chronic Pulmonary hypertension (HCC) - IV Lasix - Cardiology consult for consideration of inpatient right heart cath(previously scheduled for 12/8) - Daily weights with intake and output monitoring  Possible viral respiratory tract infection - Patient symptomatic for cough productive of green phlegm and increased shortness of breath - Respiratory viral panel    COPD (chronic obstructive pulmonary disease) ([place]) - DuoNebs as needed-continue home inhalers    Generalized weakness    Fall - Physical therapy evaluation    Obesity, Class III, BMI 40-49.9 (morbid obesity) ([place]) - Complicating factor to overall prognosis and care    Aneurysm of thoracic aorta - Stable 4.2 cm aneurysm - Continue outpatient surveillance    Hypothyroidism - Continue levothyroxine    Chronic kidney disease, stage 3a ([place]) - Renal function at baseline  Diabetes - Sliding scale insulin coverage   DVT prophylaxis: Lovenox  Code Status: full code  Family Communication:  none  Disposition Plan: Back to previous home environment Consults called: cardiology  Status:At the time of admission, it appears that the appropriate admission status for this  patient is INPATIENT. This is judged to be reasonable and necessary in order to provide the required intensity of service to ensure the patient's safety given the presenting symptoms, physical exam findings, and initial radiographic and laboratory data in the context of their  Comorbid  conditions.   Patient requires inpatient status due to high intensity of service, high risk for further deterioration and high frequency of surveillance required.   I certify that at the point of admission it is my clinical judgment that the patient will require inpatient hospital care spanning beyond 2 midnights     Physical Exam: Vitals:   11/19/21 2052 11/19/21 2100 11/19/21 2225 11/19/21 2330  BP: 112/78 115/69 104/65 108/75  Pulse: 75 80 83 85  Resp: 20 (!) 28 (!) 26 (!) 23  Temp:      SpO2: 92% 93% 91% 91%  Weight:      Height:       Constitutional: Alert, oriented x 3 .  Appears deconditioned.  Speaking in short sentences HEENT:      Head: Normocephalic and atraumatic.         Eyes: PERLA, EOMI, Conjunctivae are normal. Sclera is non-icteric.       Mouth/Throat: Mucous membranes are moist.       Neck: Supple with no signs of meningismus. Cardiovascular: Regular rate and rhythm. No murmurs, gallops, or rubs. 2+ symmetrical distal pulses are present . No JVD.  3+ LE edema Respiratory: Respiratory effort increased.tachypneic.  Lungs sounds diminished bilaterally.  Scattered wheezes gastrointestinal: Soft, non tender, non distended. Positive bowel sounds.  Genitourinary: No CVA tenderness. Musculoskeletal: Nontender with normal range of motion in all extremities. No cyanosis, or erythema of extremities. Neurologic:  Face is symmetric. Moving all extremities. No gross focal neurologic deficits . Skin: Skin is warm, dry.  No rash or ulcers Psychiatric: Mood and affect are appropriate     Past Medical History:  Diagnosis Date   Asthma    Chronic hypoxemic respiratory failure (HCC)    uses O2 with exertion and with CPAP   COPD (chronic obstructive pulmonary disease) (HCC)    Depression    Diabetes mellitus without complication (HCC)    Dyspnea    doe   Dysrhythmia    extra beat   Fatty liver    Fibromyalgia    Generalized osteoarthritis of multiple sites    History of  hiatal hernia    Hypertension    Hypothyroidism    Melanoma ([place]) 07/2018   Right leg   OSA (obstructive sleep apnea)    Pain    chronic ruq and back pain   Panic attacks    PONV (postoperative nausea and vomiting)    after thyroidectomy   Raynaud disease    RLS (restless legs syndrome)    Spleen absent    TOLD ABSENT THEN TOLD DOES HAVE SPLEEN. PATIENT IS UNCERTAIN   Tremor, essential    Wears dentures    full upper and lower    Past Surgical History:  Procedure Laterality Date   CATARACT EXTRACTION W/PHACO Right 03/04/2021   Procedure: CATARACT EXTRACTION PHACO AND INTRAOCULAR LENS PLACEMENT (IOC) RIGHT DIABETIC 3.98 00:33.2;  Surgeon: Eulogio Bear, MD;  [facility];  Service: Ophthalmology;  Laterality: Right;  Diabetic - oral meds   CATARACT EXTRACTION W/PHACO Left 03/18/2021   Procedure: CATARACT EXTRACTION PHACO AND INTRAOCULAR LENS PLACEMENT ([place]) LEFT;  Surgeon: Eulogio Bear, MD;  Location: [facility];  Service: Ophthalmology;  Laterality: Left;  2.96 0:27.9   CHOLECYSTECTOMY     DILATATION & CURETTAGE/HYSTEROSCOPY WITH MYOSURE N/A 11/10/2018   Procedure: DILATATION & CURETTAGE/HYSTEROSCOPY WITH MYOSURE/MYOMECTOMY;  Surgeon: Aletha Halim, MD;  Location: [facility];  Service: Gynecology;  Laterality: N/A;  possible myosure.  Please use myosure scope, do not open myosure blades but have in the room   JOINT REPLACEMENT Bilateral    2008/2011   MELANOMA EXCISION  07/2018   REVERSE SHOULDER ARTHROPLASTY Right 05/28/2017   Procedure: REVERSE SHOULDER ARTHROPLASTY;  Surgeon: Corky Mull, MD;  Location: [facility];  Service: Orthopedics;  Laterality: Right;   RHINOPLASTY  1972   THYROIDECTOMY  2006   TOTAL KNEE ARTHROPLASTY     bilateral     reports that she quit smoking about 32 years ago. Her smoking use included cigarettes. She has never used smokeless tobacco. She reports that she does not drink alcohol and does not  use drugs.  Allergies  Allergen Reactions   Latex Hives    Tape and bandaids only   Nickel Hives   Pramipexole Other (See Comments)    Caused hallucinations, says he can take name brand.   Prednisone     Makes her "feel crazy"   Topiramate Other (See Comments)    "spaced out"   Citalopram Anxiety   Paroxetine Hcl Anxiety   Sertraline Anxiety    Family History  Problem Relation Age of Onset   Osteoarthritis Mother    Diabetes Mother    Cirrhosis Mother    Cancer Father    Kidney cancer Father    Bladder Cancer Father    Heart disease Brother        stents in 1 brother   Breast cancer Neg Hx       Prior to Admission medications   Medication Sig Start Date End Date Taking? Authorizing Provider  acetaminophen (TYLENOL) 650 MG CR tablet Take 650 mg by mouth 3 (three) times daily.    [provider]  albuterol (PROVENTIL HFA) 108 (90 Base) MCG/ACT inhaler Inhale 2 puffs into the lungs every 6 (six) hours as needed for wheezing or shortness of breath. 01/06/20   Venia Carbon, MD  ALPRAZolam Duanne Moron) 0.25 MG tablet Take 1 tablet (0.25 mg total) by mouth 2 (two) times daily as needed for anxiety. 11/06/21   Venia Carbon, MD  aspirin EC 81 MG EC tablet Take 1 tablet (81 mg total) by mouth daily. 10/26/18   Vaughan Basta, MD  budesonide-formoterol (SYMBICORT) 160-4.5 MCG/ACT inhaler Inhale 2 puffs into the lungs 2 (two) times daily. 06/26/20   [provider]  calcium carbonate (OS-CAL) 600 MG TABS Take 600 mg by mouth daily with breakfast.    [provider]  cholecalciferol (VITAMIN D) 1000 units tablet Take 2,000 Units by mouth 2 (two) times daily.    [provider]  DULoxetine (CYMBALTA) 60 MG capsule TAKE 1 CAPSULE BY MOUTH  DAILY 01/30/21   Venia Carbon, MD  fluticasone (FLONASE) 50 MCG/ACT nasal spray Place 1 spray into both nostrils daily as needed. For stuffy nose 04/02/17   [provider]  glucose blood (ONETOUCH  VERIO) test strip Use to check blood sugar once a day. E11.9 09/30/18   [name] I, MD  hydrochlorothiazide (HYDRODIURIL) 12.5 MG tablet TAKE 1 TABLET BY MOUTH  DAILY 01/09/21   Venia Carbon, MD  levothyroxine (SYNTHROID) 200 MCG tablet TAKE 1 TABLET BY MOUTH  DAILY BEFORE BREAKFAST 05/09/21   Shamleffer, Mammie Lorenzo  Amedeo Kinsman, MD  Magnesium 500 MG TABS Take 100 mg by mouth 2 (two) times daily.    [provider]  metFORMIN (GLUCOPHAGE-XR) 500 MG 24 hr tablet TAKE 1 TABLET BY MOUTH  DAILY WITH BREAKFAST 01/09/21   [name] I, MD  montelukast (SINGULAIR) 10 MG tablet TAKE 1 TABLET BY MOUTH AT  BEDTIME 01/09/21   Venia Carbon, MD  [facility] DELICA LANCETS FINE MISC 1 each by Does not apply route daily. Use to check blood sugar once a day. E11.9 09/30/18   [name] I, MD  potassium chloride (K-DUR) 10 MEQ tablet Take 1 tablet (10 mEq total) by mouth daily. 10/25/18   Vaughan Basta, MD  propranolol (INDERAL) 40 MG tablet TAKE 1 TABLET BY MOUTH  TWICE DAILY 01/09/21   [name] I, MD  rOPINIRole (REQUIP) 3 MG tablet TAKE ONE TABLET BY MOUTH AT BEDTIME Patient taking differently: 2 mg. 12/17/20   Venia Carbon, MD  silver sulfADIAZINE (SILVADENE) 1 % cream Apply 1 application topically daily. 10/29/21   Venia Carbon, MD  torsemide (DEMADEX) 20 MG tablet Take 1 tablet (20 mg total) by mouth daily as needed. 09/10/21   Venia Carbon, MD  Turmeric Curcumin 500 MG CAPS Take 500 mg by mouth at bedtime.     [provider]  vitamin C (ASCORBIC ACID) 500 MG tablet Take 500 mg by mouth daily.    [provider]  VITAMIN E PO Take by mouth.    [provider]      Labs on Admission: I have personally reviewed following labs and imaging studies  CBC: Recent Labs  Lab 11/19/21 1432  WBC 11.9*  HGB 16.1*  HCT 48.0*  MCV 93.8  PLT 326   Basic Metabolic Panel: Recent Labs  Lab 11/19/21 1432  NA 138  K 3.5  CL 102  CO2  28  GLUCOSE 103*  BUN 22  CREATININE 1.15*  CALCIUM 9.2   GFR: Estimated Creatinine Clearance: 52.5 mL/min (A) (by C-G formula based on SCr of 1.15 mg/dL (H)). Liver Function Tests: Recent Labs  Lab 11/19/21 1432  AST 32  ALT 19  ALKPHOS 75  BILITOT 1.6*  PROT 6.9  ALBUMIN 3.5   No results for input(s): LIPASE, AMYLASE in the last 168 hours. No results for input(s): AMMONIA in the last 168 hours. Coagulation Profile: No results for input(s): INR, PROTIME in the last 168 hours. Cardiac Enzymes: No results for input(s): CKTOTAL, CKMB, CKMBINDEX, TROPONINI in the last 168 hours. BNP (last 3 results) No results for input(s): PROBNP in the last 8760 hours. HbA1C: No results for input(s): HGBA1C in the last 72 hours. CBG: No results for input(s): GLUCAP in the last 168 hours. Lipid Profile: No results for input(s): CHOL, HDL, LDLCALC, TRIG, CHOLHDL, LDLDIRECT in the last 72 hours. Thyroid Function Tests: No results for input(s): TSH, T4TOTAL, FREET4, T3FREE, THYROIDAB in the last 72 hours. Anemia Panel: No results for input(s): VITAMINB12, FOLATE, FERRITIN, TIBC, IRON, RETICCTPCT in the last 72 hours. Urine analysis:    Component Value Date/Time   COLORURINE AMBER (A) 10/23/2018 1425   APPEARANCEUR CLOUDY (A) 10/23/2018 1425   LABSPEC 1.014 10/23/2018 1425   PHURINE 5.0 10/23/2018 1425   GLUCOSEU NEGATIVE 10/23/2018 1425   HGBUR MODERATE (A) 10/23/2018 1425   BILIRUBINUR Neg 12/27/2018 1522   KETONESUR 20 (A) 10/23/2018 1425   PROTEINUR Negative 12/27/2018 1522   PROTEINUR 100 (A) 10/23/2018 1425   UROBILINOGEN 0.2 12/27/2018 1522  UROBILINOGEN 0.2 11/08/2010 1253   NITRITE Neg 12/27/2018 1522   NITRITE NEGATIVE 10/23/2018 1425   LEUKOCYTESUR Negative 12/27/2018 1522    Radiological Exams on Admission: DG Chest 2 View  Result Date: 11/19/2021 CLINICAL DATA:  Shortness of breath EXAM: CHEST - 2 VIEW COMPARISON:  Radiograph 10/23/2018, chest CT 09/26/2021  FINDINGS: Unchanged cardiomediastinal silhouette. Prominent pulmonary arteries. Bibasilar airspace opacities. Reverse right shoulder arthroplasty. No acute osseous abnormality. Thoracic spondylosis. IMPRESSION: Bibasilar airspace opacities which could be atelectasis or developing infection. Electronically Signed   By: Maurine Simmering M.D.   On: 11/19/2021 15:13   CT Angio Chest PE W and/or Wo Contrast  Result Date: 11/19/2021 CLINICAL DATA:  Status post fall. EXAM: CT ANGIOGRAPHY CHEST WITH CONTRAST TECHNIQUE: Multidetector CT imaging of the chest was performed using the standard protocol during bolus administration of intravenous contrast. Multiplanar CT image reconstructions and MIPs were obtained to evaluate the vascular anatomy. CONTRAST:  135mL OMNIPAQUE IOHEXOL 350 MG/ML SOLN COMPARISON:  September 26, 2021 FINDINGS: Cardiovascular: Stable 4.2 cm diameter aneurysmal dilatation of the ascending thoracic aorta is seen. Stable pulmonary artery enlargement is seen. Satisfactory opacification of the pulmonary arteries to the segmental level. No evidence of pulmonary embolism. There is mild cardiomegaly. No pericardial effusion. Mediastinum/Nodes: Mild AP window 1 mild pretracheal lymphadenopathy is seen. Thyroid gland, trachea, and esophagus demonstrate no significant findings. Lungs/Pleura: Mild linear atelectasis is seen within the posterolateral aspect of the right apex, anteromedial aspect of the left upper lobe and posterior aspects of the bilateral lung bases. There is no evidence of acute infiltrate, pleural effusion or pneumothorax. Upper Abdomen: No acute abnormality. Musculoskeletal: Acute, anterolateral seventh and eighth left rib fractures are seen. Acute, displaced posterior ninth and tenth left rib fractures are also noted. A right shoulder replacement is noted. Multilevel degenerative changes are seen throughout the thoracic spine. Review of the MIP images confirms the above findings. IMPRESSION: 1.  Acute, seventh, eighth, ninth and tenth left rib fractures, as described above. 2. Stable 4.2 cm diameter aneurysmal dilatation of the ascending thoracic aorta. 3. Stable pulmonary artery enlargement, which may represent pulmonary arterial hypertension. Electronically Signed   By: Virgina Norfolk M.D.   On: 11/19/2021 23:02       Athena Masse MD [facility]   11/20/2021, 12:02 AM

## 2021-11-20 NOTE — TOC Initial Note (Addendum)
Transition of Care [facility]) - Initial/Assessment Note    Patient Details  Name: Kari Medina MRN: [id number] Date of Birth: March 21, 1946  [facility]) CM/SW Contact:    Ova Freshwater Phone Number: [phone] 11/20/2021, 12:14 PM  Clinical Narrative:                  Patient presents to [facility] from home due to fall.  Patient is wears Cpap at nigh provided by [facility].  Patient is independent with most ADLs and uses a walker to ambulate.  PT is recommending Home Health PT.  CSW updated patient on PT recommendation and she is amenable to home health. Patient currently does not have a a preferred home health agency. Main contact Annita, Ratliff (Spouse) [phone].   CSW contacted Mr. Asmus to find out name of home health agency they have used in the past, left voicemail.  Expected Discharge Plan: [place] Barriers to Discharge: Continued Medical Work up   Patient Goals and CMS Choice Patient states their goals for this hospitalization and ongoing recovery are:: to return home with home health   Choice offered to / list presented to : Spouse Elynore, Dolinski F (Spouse)   [phone] (Mobile))  Expected Discharge Plan and Services Expected Discharge Plan: [place] In-house Referral: Clinical Social Work   Post Acute Care Choice: IP Rehab Living arrangements for the past 2 months: [place]                                      Prior Living Arrangements/Services Living arrangements for the past 2 months: [place] Lives with:: Spouse Jelitza, Manninen F (Spouse)   [phone] (Mobile)) Patient language and need for interpreter reviewed:: Yes        Need for Family Participation in Patient Care: Yes (Comment) Care giver support system in place?: Yes (comment) Current home services: DME (CPap provided by [facility]) Criminal Activity/Legal Involvement Pertinent to Current Situation/Hospitalization: No  - Comment as needed  Activities of Daily Living      Permission Sought/Granted      Share Information with NAME: Maritssa, Haughton (Spouse)   [phone] (Mobile)     Permission granted to share info w Relationship: Alvino Chapel Daughter     [phone]     Emotional Assessment Appearance:: Appears older than stated age Attitude/Demeanor/Rapport: Engaged Affect (typically observed): Stable Orientation: : Oriented to Self, Oriented to Place, Oriented to  Time, Oriented to Situation Alcohol / Substance Use: Not Applicable Psych Involvement: No (comment)  Admission diagnosis:  Acute on chronic respiratory failure with hypoxia (HCC) [J96.21] Patient Active Problem List   Diagnosis Date Noted   Acute on chronic respiratory failure with hypoxia ([place]) 11/19/2021   Chronic kidney disease, stage 3a ([place]) 11/19/2021   Multiple fractures of ribs, left side, initial encounter for closed fracture 11/19/2021   Cor pulmonale ([place]) 11/19/2021   Edema 09/10/2021   Pulmonary hypertension ([place]) 09/10/2021   Venous stasis ulcer of right calf ([place]) 09/10/2021   Aortic atherosclerosis ([place]) 01/11/2021   Hashimoto's thyroiditis 01/10/2021   Hypothyroidism 07/06/2020   Chronic hypoxemic respiratory failure ([place]) 07/06/2020   Prediabetes 09/06/2018   History of melanoma 07/22/2018   Aneurysm of thoracic aorta 04/07/2018   Mood disorder ([place]) 03/02/2018   Status post total bilateral knee replacement 12/03/2017   Preventative health care 08/20/2017   Urinary incontinence  08/20/2017   Advance directive discussed with patient 08/20/2017   Status post reverse total shoulder replacement, right 05/28/2017   Obesity, Class III, BMI 40-49.9 (morbid obesity) ([place]) 05/22/2016   COPD (chronic obstructive pulmonary disease) ([place]) 04/16/2016   Generalized weakness 04/16/2016   Hypertension    Tremor, essential    OSA (obstructive sleep apnea)    RLS (restless legs syndrome)    Raynaud disease     Osteoarthrosis, generalized, multiple joints    PCP:  Venia Carbon, MD Pharmacy:   [facility] [id number], [place] [address] Phone: [phone] Fax: [phone]  [facility] ([place], [facility] [address] [place] [address] [place] [address] Phone: [phone] Fax: [phone]     Social Determinants of Health (SDOH) Interventions    Readmission Risk Interventions No flowsheet data found.

## 2021-11-20 NOTE — ED Notes (Signed)
Patient resting quietly.  Reports improvement in pain after moving to hospital bed.  Needs addressed at this time

## 2021-11-20 NOTE — Evaluation (Signed)
Physical Therapy Evaluation Patient Details Name: Kari Medina MRN: [id number] DOB: 1946/02/08 Today's Date: 11/20/2021  History of Present Illness  Kari Medina is a 75 y.o. female with medical history significant for COPD on home O2 at 2 L, class III obesity, CKD 3a, DM, thoracic aortic aneurysm, history of melanoma, pulmonary hypertension scheduled for right heart cath in December presents to the ED with a several month history of gradually worsening shortness of breath , lower extremity edema and generalized weakness  leading to a fall in the shower on the day of arrival, hitting her left chest.  She did not hit her head or lose consciousness. Had a fall a week prior without injury.   Clinical Impression  Patient received in bed, O2 saturation in upper 80%s on 6 liters. Patient agreeable to PT assessment. She reports weakness that has been going on for the last year. She requires min guard/mod I for bed mobility, transfers with min assist and is able to take a few steps along edge of bed. Patient will continue to benefit from skilled PT while here to improve general strength, endurance and independence with mobility.        Recommendations for follow up therapy are one component of a multi-disciplinary discharge planning process, led by the attending physician.  Recommendations may be updated based on patient status, additional functional criteria and insurance authorization.  Follow Up Recommendations Home health PT    Assistance Recommended at Discharge Intermittent Supervision/Assistance  Functional Status Assessment Patient has had a recent decline in their functional status and demonstrates the ability to make significant improvements in function in a reasonable and predictable amount of time.  Equipment Recommendations  None recommended by PT    Recommendations for Other Services       Precautions / Restrictions Precautions Precautions: Fall Restrictions Weight  Bearing Restrictions: No      Mobility  Bed Mobility Overal bed mobility: Modified Independent                  Transfers Overall transfer level: Needs assistance Equipment used: None Transfers: Sit to/from Stand Sit to Stand: Min assist                Ambulation/Gait Ambulation/Gait assistance: Min assist Gait Distance (Feet): 3 Feet Assistive device: 1 person hand held assist Gait Pattern/deviations: Step-to pattern;Trunk flexed Gait velocity: decr     General Gait Details: patient able to take a few side steps along edge of bed. O2 sats in mid 80%s during session.  Stairs            Wheelchair Mobility    Modified Rankin (Stroke Patients Only)       Balance Overall balance assessment: Needs assistance;History of Falls Sitting-balance support: Feet supported;Bilateral upper extremity supported Sitting balance-Leahy Scale: Fair     Standing balance support: Single extremity supported;During functional activity;Reliant on assistive device for balance Standing balance-Leahy Scale: Fair Standing balance comment: requires holding to bed to take a few steps along edge of bed.                             Pertinent Vitals/Pain Pain Assessment: Faces Faces Pain Scale: Hurts a little bit Pain Location: left side Pain Descriptors / Indicators: Discomfort;Sore Pain Intervention(s): Limited activity within patient's tolerance;Monitored during session;Repositioned    Home Living Family/patient expects to be discharged to:: Private residence Living Arrangements: Spouse/significant other Available Help at Discharge: Family;Available 24  hours/day Type of Home: House Home Access: Stairs to enter Entrance Stairs-Rails: Psychiatric nurse of Steps: 6   Home Layout: One level Home Equipment: Rollator (4 wheels);Shower seat;Grab bars - tub/shower      Prior Function Prior Level of Function : Independent/Modified  Independent;History of Falls (last six months)             Mobility Comments: 2 falls in the last couple of weeks, feels like she is getting weaker the last year.       Hand Dominance   Dominant Hand: Right    Extremity/Trunk Assessment   Upper Extremity Assessment Upper Extremity Assessment: Generalized weakness    Lower Extremity Assessment Lower Extremity Assessment: Generalized weakness    Cervical / Trunk Assessment Cervical / Trunk Assessment: Normal  Communication   Communication: No difficulties  Cognition Arousal/Alertness: Awake/alert Behavior During Therapy: WFL for tasks assessed/performed Overall Cognitive Status: Within Functional Limits for tasks assessed                                          General Comments      Exercises     Assessment/Plan    PT Assessment Patient needs continued PT services  PT Problem List Decreased strength;Decreased mobility;Decreased activity tolerance;Decreased balance;Pain;Cardiopulmonary status limiting activity;Obesity       PT Treatment Interventions DME instruction;Therapeutic activities;Gait training;Therapeutic exercise;Stair training;Patient/family education;Functional mobility training;Balance training    PT Goals (Current goals can be found in the Care Plan section)  Acute Rehab PT Goals Patient Stated Goal: to get better and go home PT Goal Formulation: With patient/family Time For Goal Achievement: 12/04/21 Potential to Achieve Goals: Fair    Frequency Min 2X/week   Barriers to discharge Inaccessible home environment lives with husband, steps to enter home    Co-evaluation               AM-PAC PT "6 Clicks" Mobility  Outcome Measure Help needed turning from your back to your side while in a flat bed without using bedrails?: A Little Help needed moving from lying on your back to sitting on the side of a flat bed without using bedrails?: A Little Help needed moving to and  from a bed to a chair (including a wheelchair)?: A Little Help needed standing up from a chair using your arms (e.g., wheelchair or bedside chair)?: A Little Help needed to walk in hospital room?: A Lot Help needed climbing 3-5 steps with a railing? : A Lot 6 Click Score: 16    End of Session Equipment Utilized During Treatment: Oxygen Activity Tolerance: Patient limited by fatigue;Treatment limited secondary to medical complications (Comment) (Low oxygen sats) Patient left: in bed;with call bell/phone within reach;with bed alarm set Nurse Communication: Mobility status;Other (comment) (low oxygen saturation, bed was wet, may need IS) PT Visit Diagnosis: Muscle weakness (generalized) (M62.81);Other abnormalities of gait and mobility (R26.89);Pain;Repeated falls (R29.6);Difficulty in walking, not elsewhere classified (R26.2) Pain - Right/Left: Left Pain - part of body:  (side)    Time: 1610-9604 PT Time Calculation (min) (ACUTE ONLY): 20 min   Charges:   PT Evaluation $PT Eval Moderate Complexity: 1 Mod          Kari Medina, PT, GCS 11/20/21,10:30 AM

## 2021-11-20 NOTE — Progress Notes (Signed)
PHARMACIST - PHYSICIAN COMMUNICATION  CONCERNING:  Enoxaparin (Lovenox) for DVT Prophylaxis    RECOMMENDATION: Patient was prescribed enoxaprin 40mg  q24 hours for VTE prophylaxis.   Filed Weights   11/19/21 1432  Weight: 108.9 kg (240 lb)    Body mass index is 41.2 kg/m.  Estimated Creatinine Clearance: 52.5 mL/min (A) (by C-G formula based on SCr of 1.15 mg/dL (H)).   Based on [place] patient is candidate for enoxaparin 0.5mg /kg TBW SQ every 24 hours based on BMI being >30.  DESCRIPTION: Pharmacy has adjusted enoxaparin dose per [facility] policy.  Patient is now receiving enoxaparin 0.5 mg/kg every 24 hours    Renda Rolls, PharmD, [facility] 11/20/2021 12:23 AM

## 2021-11-20 NOTE — ED Notes (Signed)
Jun RN aware of assigned bed

## 2021-11-20 NOTE — Progress Notes (Signed)
*  PRELIMINARY RESULTS* Echocardiogram 2D Echocardiogram has been performed.  Sherrie Sport 11/20/2021, 2:25 PM

## 2021-11-20 NOTE — Consult Note (Signed)
[facility] Cardiology  CARDIOLOGY CONSULT NOTE  Patient ID: Chemere Steffler MRN: [id number] DOB/AGE: Apr 09, 1946 75 y.o.  Admit date: 11/19/2021 Referring Physician Ralene Muskrat Primary Physician Venia Carbon, MD Primary Cardiologist Jordan Hawks; Andrez Grime, MD Reason for Consultation Possible PH, fall  HPI:  Kari Medina is a 75 year old female with history of morbid obesity, hypertension, hyperlipidemia, obstructive sleep apnea, COPD on 2 L O2 who was admitted to the hospital after a fall resulting in multiple fractured ribs.  Cardiology is consulted because she is currently undergoing evaluation for pulmonary hypertension and appears volume overloaded on exam.  She was seen in cardiology clinic on 11/18/2021 at which time she described progressively worsening shortness of breath over the last several years.  She had recently seen her pulmonologist who is concerned she may have pulmonary hypertension and we had planned for a right heart catheterization in the outpatient setting in the next few weeks.  She was in the shower on 11/19/2021 when she fell; she was stepping over something in the shower and reached for something to grab but missed.  The fall led to her landing on her left chest jolted and fractures of multiple ribs on the left side. She fell 1 week prior which was also mechanical in nature. Her vital signs of been relatively stable since admission, though she is requiring 4 L of oxygen, up from a baseline of 2 L.  Review of systems complete and found to be negative unless listed above     Past Medical History:  Diagnosis Date   Asthma    Chronic hypoxemic respiratory failure ([place])    uses O2 with exertion and with CPAP   COPD (chronic obstructive pulmonary disease) (HCC)    Depression    Diabetes mellitus without complication (HCC)    Dyspnea    doe   Dysrhythmia    extra beat   Fatty liver    Fibromyalgia    Generalized osteoarthritis of multiple sites     History of hiatal hernia    Hypertension    Hypothyroidism    Melanoma ([place]) 07/2018   Right leg   OSA (obstructive sleep apnea)    Pain    chronic ruq and back pain   Panic attacks    PONV (postoperative nausea and vomiting)    after thyroidectomy   Raynaud disease    RLS (restless legs syndrome)    Spleen absent    TOLD ABSENT THEN TOLD DOES HAVE SPLEEN. PATIENT IS UNCERTAIN   Tremor, essential    Wears dentures    full upper and lower    Past Surgical History:  Procedure Laterality Date   CATARACT EXTRACTION W/PHACO Right 03/04/2021   Procedure: CATARACT EXTRACTION PHACO AND INTRAOCULAR LENS PLACEMENT (I[facility]) RIGHT DIABETIC 3.98 00:33.2;  Surgeon: Eulogio Bear, MD;  Location: [place];  Service: Ophthalmology;  Laterality: Right;  Diabetic - oral meds   CATARACT EXTRACTION W/PHACO Left 03/18/2021   Procedure: CATARACT EXTRACTION PHACO AND INTRAOCULAR LENS PLACEMENT ([place]) LEFT;  Surgeon: Eulogio Bear, MD;  Location: [place];  Service: Ophthalmology;  Laterality: Left;  2.96 0:27.9   CHOLECYSTECTOMY     DILATATION & CURETTAGE/HYSTEROSCOPY WITH MYOSURE N/A 11/10/2018   Procedure: DILATATION & CURETTAGE/HYSTEROSCOPY WITH MYOSURE/MYOMECTOMY;  Surgeon: Aletha Halim, MD;  Location: [place];  Service: Gynecology;  Laterality: N/A;  possible myosure.  Please use myosure scope, do not open myosure blades but have in the room   JOINT REPLACEMENT Bilateral  2008/2011   MELANOMA EXCISION  07/2018   REVERSE SHOULDER ARTHROPLASTY Right 05/28/2017   Procedure: REVERSE SHOULDER ARTHROPLASTY;  Surgeon: Corky Mull, MD;  Location: [facility];  Service: Orthopedics;  Laterality: Right;   RHINOPLASTY  1972   THYROIDECTOMY  2006   TOTAL KNEE ARTHROPLASTY     bilateral    (Not in a hospital admission)  Social History   Socioeconomic History   Marital status: Married    Spouse name: Not on file   Number of children: 1   Years of  education: Not on file   Highest education level: Not on file  Occupational History   Occupation: Neurosurgeon    Comment: Retired  Tobacco Use   Smoking status: Former    Types: Cigarettes    Quit date: 12/22/1988    Years since quitting: 32.9   Smokeless tobacco: Never  Vaping Use   Vaping Use: Never used  Substance and Sexual Activity   Alcohol use: No   Drug use: No   Sexual activity: Not on file  Other Topics Concern   Not on file  Social History Narrative   1 daughter      Has living will   Husband has health care POA. Alternate would be daughter Judson Roch   Would allow resuscitation but no prolonged machines   Not sure about feeding tubes   Social Determinants of Health   Financial Resource Strain: Not on file  Food Insecurity: Not on file  Transportation Needs: Not on file  Physical Activity: Not on file  Stress: Not on file  Social Connections: Not on file  Intimate Partner Violence: Not on file    Family History  Problem Relation Age of Onset   Osteoarthritis Mother    Diabetes Mother    Cirrhosis Mother    Cancer Father    Kidney cancer Father    Bladder Cancer Father    Heart disease Brother        stents in 1 brother   Breast cancer Neg Hx       Review of systems complete and found to be negative unless listed above      PHYSICAL EXAM  General: Well developed, well nourished, in no acute distress.  HEENT:  Normocephalic and atramatic Neck:  No JVD.  Lungs: Clear bilaterally to auscultation and percussion. [place] oxygen in place. Heart: HRRR . Normal S1 and S2 without gallops or murmurs.  Abdomen: Bowel sounds are positive, abdomen soft and non-tender  Msk:  Back normal, normal gait. Normal strength and tone for age. Extremities: 2+ LE edema Neuro: Alert and oriented X 3. Psych:  Good affect, responds appropriately  Labs:   Lab Results  Component Value Date   WBC 12.1 (H) 11/20/2021   HGB 15.4 (H) 11/20/2021   HCT 46.8 (H)  11/20/2021   MCV 95.5 11/20/2021   PLT 162 11/20/2021    Recent Labs  Lab 11/19/21 1432 11/20/21 0103  NA 138  --   K 3.5  --   CL 102  --   CO2 28  --   BUN 22  --   CREATININE 1.15* 1.22*  CALCIUM 9.2  --   PROT 6.9  --   BILITOT 1.6*  --   ALKPHOS 75  --   ALT 19  --   AST 32  --   GLUCOSE 103*  --    No results found for: CKTOTAL, CKMB, CKMBINDEX, TROPONINI  Lab Results  Component Value  Date   CHOL 136 10/29/2021   CHOL 162 01/11/2021   CHOL 173 07/06/2020   Lab Results  Component Value Date   HDL 27.90 (L) 10/29/2021   HDL 40.00 01/11/2021   HDL 45.90 07/06/2020   Lab Results  Component Value Date   LDLCALC 78 10/29/2021   LDLCALC 87 01/11/2021   LDLCALC 102 (H) 07/06/2020   Lab Results  Component Value Date   TRIG 151.0 (H) 10/29/2021   TRIG 178.0 (H) 01/11/2021   TRIG 124.0 07/06/2020   Lab Results  Component Value Date   CHOLHDL 5 10/29/2021   CHOLHDL 4 01/11/2021   CHOLHDL 4 07/06/2020   No results found for: LDLDIRECT    Radiology: DG Chest 2 View  Result Date: 11/19/2021 CLINICAL DATA:  Shortness of breath EXAM: CHEST - 2 VIEW COMPARISON:  Radiograph 10/23/2018, chest CT 09/26/2021 FINDINGS: Unchanged cardiomediastinal silhouette. Prominent pulmonary arteries. Bibasilar airspace opacities. Reverse right shoulder arthroplasty. No acute osseous abnormality. Thoracic spondylosis. IMPRESSION: Bibasilar airspace opacities which could be atelectasis or developing infection. Electronically Signed   By: Maurine Simmering M.D.   On: 11/19/2021 15:13   CT Angio Chest PE W and/or Wo Contrast  Result Date: 11/19/2021 CLINICAL DATA:  Status post fall. EXAM: CT ANGIOGRAPHY CHEST WITH CONTRAST TECHNIQUE: Multidetector CT imaging of the chest was performed using the standard protocol during bolus administration of intravenous contrast. Multiplanar CT image reconstructions and MIPs were obtained to evaluate the vascular anatomy. CONTRAST:  165mL OMNIPAQUE IOHEXOL  350 MG/ML SOLN COMPARISON:  September 26, 2021 FINDINGS: Cardiovascular: Stable 4.2 cm diameter aneurysmal dilatation of the ascending thoracic aorta is seen. Stable pulmonary artery enlargement is seen. Satisfactory opacification of the pulmonary arteries to the segmental level. No evidence of pulmonary embolism. There is mild cardiomegaly. No pericardial effusion. Mediastinum/Nodes: Mild AP window 1 mild pretracheal lymphadenopathy is seen. Thyroid gland, trachea, and esophagus demonstrate no significant findings. Lungs/Pleura: Mild linear atelectasis is seen within the posterolateral aspect of the right apex, anteromedial aspect of the left upper lobe and posterior aspects of the bilateral lung bases. There is no evidence of acute infiltrate, pleural effusion or pneumothorax. Upper Abdomen: No acute abnormality. Musculoskeletal: Acute, anterolateral seventh and eighth left rib fractures are seen. Acute, displaced posterior ninth and tenth left rib fractures are also noted. A right shoulder replacement is noted. Multilevel degenerative changes are seen throughout the thoracic spine. Review of the MIP images confirms the above findings. IMPRESSION: 1. Acute, seventh, eighth, ninth and tenth left rib fractures, as described above. 2. Stable 4.2 cm diameter aneurysmal dilatation of the ascending thoracic aorta. 3. Stable pulmonary artery enlargement, which may represent pulmonary arterial hypertension. Electronically Signed   By: Virgina Norfolk M.D.   On: 11/19/2021 23:02    EKG: NSR with biatrial enlargement. Right axis deviation.   Echo 05/2021-NORMAL LEFT VENTRICULAR SYSTOLIC FUNCTION  NORMAL RIGHT VENTRICULAR SYSTOLIC FUNCTION  TRIVIAL REGURGITATION NOTED (See above)  NO VALVULAR STENOSIS  TECHNICALLY DIFFICULT STUDY  POOR SOUND TRANSMISSION  LIMITED VIEWS  Closest EF: >55% (Estimated)  LVH: CONCENTRIC  Dias.FxClass: (Grade 1) relaxation abnormal, E/A reversal  Mitral: TRIVIAL MR  Tricuspid: TRIVIAL  TR  LVH: MODERATE LVH   ASSESSMENT AND PLAN:  Kari Medina is a 75 year old female with history of morbid obesity, hypertension, hyperlipidemia, obstructive sleep apnea, COPD on 2 L O2 who was admitted to the hospital after a fall resulting in multiple fractured ribs.  Cardiology is consulted because she is currently undergoing evaluation for pulmonary hypertension  and appears volume overloaded on exam.  # Mechanical fall # Left sided rib fractures # Morbid obesity, OSA, COPD on 2 L O2 # Concern for pulmonary HTN The patient is admitted after a fall at home in the shower resulting in multiple rib fractures.She had no prodrome of dizziness prior and did not lose consciousness; strictly mechanical in nature. She has been experiencing some decline over the last several months and her BNP on presentation is 1100.  She appears volume overloaded on exam, though this is chronically the case and has been taking torsemide for this. - Agree with IV diuresis - Continue home medications - RHC is indicated for outpatient workup but does not seem related to her current presenation. Will defer to outpatient setting unless she has difficulty diuresing.  - Echocardiogram complete.  Signed: Andrez Grime MD 11/20/2021, 12:05 PM

## 2021-11-20 NOTE — Progress Notes (Signed)
Brief hospitalist update note.  This is a nonbillable note.  Please see same-day H&P for full billable details.  Briefly, this is a 75 year old female history of obesity, hypertension, hyperlipidemia, obstructive sleep apnea, COPD on chronic 2 L admitted after a fall in the shower and resultant fractured ribs.  Cardiology consulted as the patient is currently undergoing evaluation pulmonary hypertension and does have clinical evidence of fluid overload.  She was recently seen in cardiology clinic on 11/28 in which time she described progressively worsening shortness of breath over the last several years.  She was initially scheduled for outpatient right heart catheterization for evaluation of pulmonary hypertension however 1 of in the hospital  She is clinically fluid overloaded on exam.  She will be diuresed aggressively.  Cardiology is consulted and may consider right heart catheterization however this may be also deferred to outpatient.  We will optimize volume status is much as possible.  Ralene Muskrat MD  No charge

## 2021-11-21 ENCOUNTER — Inpatient Hospital Stay: Payer: Medicare Other

## 2021-11-21 LAB — BASIC METABOLIC PANEL
Anion gap: 7 (ref 5–15)
BUN: 20 mg/dL (ref 8–23)
CO2: 29 mmol/L (ref 22–32)
Calcium: 8.5 mg/dL — ABNORMAL LOW (ref 8.9–10.3)
Chloride: 102 mmol/L (ref 98–111)
Creatinine, Ser: 1.1 mg/dL — ABNORMAL HIGH (ref 0.44–1.00)
GFR, Estimated: 53 mL/min — ABNORMAL LOW (ref >60)
Glucose, Bld: 100 mg/dL — ABNORMAL HIGH (ref 70–99)
Potassium: 3.2 mmol/L — ABNORMAL LOW (ref 3.5–5.1)
Sodium: 138 mmol/L (ref 135–145)

## 2021-11-21 LAB — GLUCOSE, CAPILLARY
Glucose-Capillary: 116 mg/dL — ABNORMAL HIGH (ref 70–99)
Glucose-Capillary: 108 mg/dL — ABNORMAL HIGH (ref 70–99)

## 2021-11-21 LAB — BRAIN NATRIURETIC PEPTIDE: B Natriuretic Peptide: 788.4 pg/mL — ABNORMAL HIGH (ref 0.0–100.0)

## 2021-11-21 LAB — PROCALCITONIN: Procalcitonin: <0.1 ng/mL

## 2021-11-21 MED ORDER — POTASSIUM CHLORIDE CRYS ER 20 MEQ PO TBCR
40.0000 meq | EXTENDED_RELEASE_TABLET | Freq: Once | ORAL | Status: AC
  Administered 2021-11-21: 09:00:00 40 meq via ORAL
  Filled 2021-11-21: qty 2

## 2021-11-21 MED ORDER — ROPINIROLE HCL 1 MG PO TABS
3.0000 mg | ORAL_TABLET | Freq: Every day | ORAL | Status: DC
  Administered 2021-11-21 – 2021-11-25 (×5): 3 mg via ORAL
  Filled 2021-11-21 (×5): qty 3

## 2021-11-21 MED ORDER — PROPRANOLOL HCL 20 MG PO TABS
20.0000 mg | ORAL_TABLET | Freq: Two times a day (BID) | ORAL | Status: DC
  Administered 2021-11-21 – 2021-11-25 (×8): 20 mg via ORAL
  Filled 2021-11-21 (×9): qty 1

## 2021-11-21 MED ORDER — SPIRONOLACTONE 25 MG PO TABS
12.5000 mg | ORAL_TABLET | Freq: Two times a day (BID) | ORAL | Status: DC
  Administered 2021-11-21: 12.5 mg via ORAL
  Filled 2021-11-21: qty 0.5
  Filled 2021-11-21: qty 1
  Filled 2021-11-21: qty 0.5

## 2021-11-21 MED ORDER — INSULIN ASPART 100 UNIT/ML IJ SOLN: 0.0000 [IU] | Freq: Every day | INTRAMUSCULAR | Status: DC

## 2021-11-21 MED ORDER — IPRATROPIUM-ALBUTEROL 0.5-2.5 (3) MG/3ML IN SOLN
3.0000 mL | RESPIRATORY_TRACT | Status: DC
  Administered 2021-11-21 – 2021-11-25 (×18): 3 mL via RESPIRATORY_TRACT
  Filled 2021-11-21 (×20): qty 3

## 2021-11-21 MED ORDER — DULOXETINE HCL 30 MG PO CPEP
60.0000 mg | ORAL_CAPSULE | Freq: Every day | ORAL | Status: DC
  Administered 2021-11-21 – 2021-11-26 (×6): 60 mg via ORAL
  Filled 2021-11-21 (×6): qty 2

## 2021-11-21 MED ORDER — MAGNESIUM SULFATE 2 GM/50ML IV SOLN
2.0000 g | Freq: Once | INTRAVENOUS | Status: AC
  Administered 2021-11-21: 10:00:00 2 g via INTRAVENOUS
  Filled 2021-11-21: qty 50

## 2021-11-21 MED ORDER — FUROSEMIDE 10 MG/ML IJ SOLN
60.0000 mg | Freq: Two times a day (BID) | INTRAMUSCULAR | Status: DC
  Administered 2021-11-21 (×2): 60 mg via INTRAVENOUS
  Filled 2021-11-21: qty 6

## 2021-11-21 MED ORDER — PROPRANOLOL HCL 40 MG PO TABS
40.0000 mg | ORAL_TABLET | Freq: Two times a day (BID) | ORAL | Status: DC
  Administered 2021-11-21: 40 mg via ORAL
  Filled 2021-11-21 (×2): qty 1

## 2021-11-21 MED ORDER — MOMETASONE FURO-FORMOTEROL FUM 200-5 MCG/ACT IN AERO
2.0000 | INHALATION_SPRAY | Freq: Two times a day (BID) | RESPIRATORY_TRACT | Status: DC
  Administered 2021-11-21 – 2021-11-26 (×11): 2 via RESPIRATORY_TRACT
  Filled 2021-11-21: qty 8.8

## 2021-11-21 MED ORDER — FUROSEMIDE 10 MG/ML IJ SOLN
INTRAMUSCULAR | Status: AC
  Filled 2021-11-21: qty 8

## 2021-11-21 MED ORDER — LEVOTHYROXINE SODIUM 100 MCG PO TABS
200.0000 ug | ORAL_TABLET | Freq: Every day | ORAL | Status: DC
  Administered 2021-11-22 – 2021-11-26 (×5): 200 ug via ORAL
  Filled 2021-11-21 (×5): qty 2

## 2021-11-21 MED ORDER — MONTELUKAST SODIUM 10 MG PO TABS
10.0000 mg | ORAL_TABLET | Freq: Every day | ORAL | Status: DC
  Administered 2021-11-21 – 2021-11-25 (×5): 10 mg via ORAL
  Filled 2021-11-21 (×5): qty 1

## 2021-11-21 MED ORDER — INSULIN ASPART 100 UNIT/ML IJ SOLN
0.0000 [IU] | Freq: Three times a day (TID) | INTRAMUSCULAR | Status: DC
  Administered 2021-11-22: 2 [IU] via SUBCUTANEOUS
  Administered 2021-11-23: 3 [IU] via SUBCUTANEOUS
  Administered 2021-11-23 – 2021-11-24 (×2): 2 [IU] via SUBCUTANEOUS
  Administered 2021-11-25 – 2021-11-26 (×3): 3 [IU] via SUBCUTANEOUS
  Filled 2021-11-21 (×6): qty 1

## 2021-11-21 MED ORDER — FLUTICASONE PROPIONATE 50 MCG/ACT NA SUSP
1.0000 | Freq: Every day | NASAL | Status: DC | PRN
  Filled 2021-11-21: qty 16

## 2021-11-21 MED ORDER — ASPIRIN EC 81 MG PO TBEC
81.0000 mg | DELAYED_RELEASE_TABLET | Freq: Every day | ORAL | Status: DC
  Administered 2021-11-21 – 2021-11-26 (×6): 81 mg via ORAL
  Filled 2021-11-21 (×6): qty 1

## 2021-11-21 MED ORDER — MIDODRINE HCL 5 MG PO TABS: 5.0000 mg | ORAL_TABLET | Freq: Three times a day (TID) | ORAL | Status: DC

## 2021-11-21 NOTE — Consult Note (Signed)
Pulmonary Medicine          Date: 11/21/2021,   MRN# [id number] Kari Medina June 18, 1946     AdmissionWeight: 108.9 kg                 CurrentWeight: 108.9 kg   Referring physician: Dr Corky Sox   CHIEF COMPLAINT:   Increased O2 requirement with acute on chronic hypoxemic respiratory failure   HISTORY OF PRESENT ILLNESS   This is a pleasant 75 yo F with hx of COPD and OSA overlap syndrome chronically hypoxemic with CPAP and O2 bleed in at home, with extensive comborbid history including MDD, dyslipidemia, hiatal hernia, RLS, Asplenic status, raynauds, fibromyalgia, PONV, RLS came in to hospital post mechanical fall with trauma to torso and resultant rib fractures.  She is being evaluated with cardiology for CHF and PH and is noted to have severe hypoxemia requiring upwards of 12L/min supplemental O2.  She had CTPE done which I reviewed, there is very generous heart size and bibasilar atelectasis but entire lung appears with interstitial edemaPCCM consulted for further evaluation and management.    PAST MEDICAL HISTORY   Past Medical History:  Diagnosis Date   Asthma    Chronic hypoxemic respiratory failure (HCC)    uses O2 with exertion and with CPAP   COPD (chronic obstructive pulmonary disease) (HCC)    Depression    Diabetes mellitus without complication (HCC)    Dyspnea    doe   Dysrhythmia    extra beat   Fatty liver    Fibromyalgia    Generalized osteoarthritis of multiple sites    History of hiatal hernia    Hypertension    Hypothyroidism    Melanoma ([place]) 07/2018   Right leg   OSA (obstructive sleep apnea)    Pain    chronic ruq and back pain   Panic attacks    PONV (postoperative nausea and vomiting)    after thyroidectomy   Raynaud disease    RLS (restless legs syndrome)    Spleen absent    TOLD ABSENT THEN TOLD DOES HAVE SPLEEN. PATIENT IS UNCERTAIN   Tremor, essential    Wears dentures    full upper and lower     SURGICAL  HISTORY   Past Surgical History:  Procedure Laterality Date   CATARACT EXTRACTION W/PHACO Right 03/04/2021   Procedure: CATARACT EXTRACTION PHACO AND INTRAOCULAR LENS PLACEMENT ([facility]) RIGHT DIABETIC 3.98 00:33.2;  Surgeon: Eulogio Bear, MD;  Location: [place];  Service: Ophthalmology;  Laterality: Right;  Diabetic - oral meds   CATARACT EXTRACTION W/PHACO Left 03/18/2021   Procedure: CATARACT EXTRACTION PHACO AND INTRAOCULAR LENS PLACEMENT ([place]) LEFT;  Surgeon: Eulogio Bear, MD;  Location: [place];  Service: Ophthalmology;  Laterality: Left;  2.96 0:27.9   CHOLECYSTECTOMY     DILATATION & CURETTAGE/HYSTEROSCOPY WITH MYOSURE N/A 11/10/2018   Procedure: DILATATION & CURETTAGE/HYSTEROSCOPY WITH MYOSURE/MYOMECTOMY;  Surgeon: Aletha Halim, MD;  Location: [place];  Service: Gynecology;  Laterality: N/A;  possible myosure.  Please use myosure scope, do not open myosure blades but have in the room   JOINT REPLACEMENT Bilateral    2008/2011   MELANOMA EXCISION  07/2018   REVERSE SHOULDER ARTHROPLASTY Right 05/28/2017   Procedure: REVERSE SHOULDER ARTHROPLASTY;  Surgeon: Corky Mull, MD;  Location: [facility];  Service: Orthopedics;  Laterality: Right;   RHINOPLASTY  1972   THYROIDECTOMY  2006   TOTAL KNEE ARTHROPLASTY  bilateral     FAMILY HISTORY   Family History  Problem Relation Age of Onset   Osteoarthritis Mother    Diabetes Mother    Cirrhosis Mother    Cancer Father    Kidney cancer Father    Bladder Cancer Father    Heart disease Brother        stents in 1 brother   Breast cancer Neg Hx      SOCIAL HISTORY   Social History   Tobacco Use   Smoking status: Former    Packs/day: 0.25    Years: 5.00    Pack years: 1.25    Types: Cigarettes    Quit date: 12/22/1988    Years since quitting: 32.9   Smokeless tobacco: Never  Vaping Use   Vaping Use: Never used  Substance Use Topics   Alcohol use: No   Drug use: No      MEDICATIONS    Home Medication:    Current Medication:  Current Facility-Administered Medications:    0.9 %  sodium chloride infusion, 250 mL, Intravenous, PRN, Athena Masse, MD   acetaminophen (TYLENOL) tablet 650 mg, 650 mg, Oral, Q4H PRN, Athena Masse, MD   albuterol (PROVENTIL) (2.5 MG/3ML) 0.083% nebulizer solution 2.5 mg, 2.5 mg, Nebulization, Q6H PRN, Belue, Nathan S, RPH   ALPRAZolam Duanne Moron) tablet 0.25 mg, 0.25 mg, Oral, BID PRN, Judd Gaudier V, MD, 0.25 mg at 11/21/21 1610   aspirin EC tablet 81 mg, 81 mg, Oral, Daily, Sreenath, Sudheer B, MD, 81 mg at 11/21/21 1334   DULoxetine (CYMBALTA) DR capsule 60 mg, 60 mg, Oral, Daily, Sreenath, Sudheer B, MD, 60 mg at 11/21/21 1334   enoxaparin (LOVENOX) injection 55 mg, 0.5 mg/kg, Subcutaneous, Q24H, Judd Gaudier V, MD, 55 mg at 11/20/21 0852   fluticasone (FLONASE) 50 MCG/ACT nasal spray 1 spray, 1 spray, Each Nare, Daily PRN, Priscella Mann, Sudheer B, MD   furosemide (LASIX) injection 60 mg, 60 mg, Intravenous, BID, Sreenath, Sudheer B, MD, 60 mg at 11/21/21 9604   HYDROcodone-acetaminophen (NORCO/VICODIN) 5-325 MG per tablet 1 tablet, 1 tablet, Oral, Q4H PRN, Athena Masse, MD, 1 tablet at 11/21/21 1334   insulin aspart (novoLOG) injection 0-15 Units, 0-15 Units, Subcutaneous, TID WC, Sreenath, Sudheer B, MD   insulin aspart (novoLOG) injection 0-5 Units, 0-5 Units, Subcutaneous, QHS, Sreenath, Sudheer B, MD   ipratropium-albuterol (DUONEB) 0.5-2.5 (3) MG/3ML nebulizer solution 3 mL, 3 mL, Nebulization, Q4H, Sreenath, Sudheer B, MD   [START ON 11/22/2021] levothyroxine (SYNTHROID) tablet 200 mcg, 200 mcg, Oral, Q0600, Priscella Mann, Sudheer B, MD   MEDLINE mouth rinse, 15 mL, Mouth Rinse, BID, Judd Gaudier V, MD, 15 mL at 11/21/21 0930   mometasone-formoterol (DULERA) 200-5 MCG/ACT inhaler 2 puff, 2 puff, Inhalation, BID, Sreenath, Sudheer B, MD, 2 puff at 11/21/21 1335   montelukast (SINGULAIR) tablet 10 mg, 10 mg, Oral, QHS,  Sreenath, Sudheer B, MD   morphine 2 MG/ML injection 2 mg, 2 mg, Intravenous, Q2H PRN, Athena Masse, MD   ondansetron [facility]) injection 4 mg, 4 mg, Intravenous, Q6H PRN, Athena Masse, MD   propranolol (INDERAL) tablet 40 mg, 40 mg, Oral, BID, Sreenath, Sudheer B, MD, 40 mg at 11/21/21 1334   rOPINIRole (REQUIP) tablet 3 mg, 3 mg, Oral, QHS, Sreenath, Sudheer B, MD   sodium chloride flush (NS) 0.9 % injection 3 mL, 3 mL, Intravenous, Q12H, Judd Gaudier V, MD, 3 mL at 11/21/21 0930   sodium chloride flush (NS) 0.9 % injection  3 mL, 3 mL, Intravenous, PRN, Athena Masse, MD    ALLERGIES   Latex, Nickel, Pramipexole, Prednisone, Topiramate, Citalopram, Paroxetine hcl, and Sertraline     REVIEW OF SYSTEMS    Review of Systems:  Gen:  Denies  fever, sweats, chills weigh loss  HEENT: Denies blurred vision, double vision, ear pain, eye pain, hearing loss, nose bleeds, sore throat Cardiac:  No dizziness, chest pain or heaviness, chest tightness,edema Resp:   Denies cough or sputum porduction, shortness of breath,wheezing, hemoptysis,  Gi: Denies swallowing difficulty, stomach pain, nausea or vomiting, diarrhea, constipation, bowel incontinence Gu:  Denies bladder incontinence, burning urine Ext:   Denies Joint pain, stiffness or swelling Skin: Denies  skin rash, easy bruising or bleeding or hives Endoc:  Denies polyuria, polydipsia , polyphagia or weight change Psych:   Denies depression, insomnia or hallucinations   Other:  All other systems negative   VS: BP 107/81 (BP Location: Right Arm)   Pulse 92   Temp (!) 97 F (36.1 C) (Axillary)   Resp 19   Ht 5\' 4"  (1.626 m)   Wt 108.9 kg   SpO2 (!) 87%   BMI 41.20 kg/m      PHYSICAL EXAM    GENERAL:NAD, no fevers, chills, no weakness no fatigue HEAD: Normocephalic, atraumatic.  EYES: Pupils equal, round, reactive to light. Extraocular muscles intact. No scleral icterus.  MOUTH: Moist mucosal membrane. Dentition  intact. No abscess noted.  EAR, NOSE, THROAT: Clear without exudates. No external lesions.  NECK: Supple. No thyromegaly. No nodules. No JVD.  PULMONARY: Diffuse coarse rhonchi right sided +wheezes CARDIOVASCULAR: S1 and S2. Regular rate and rhythm. No murmurs, rubs, or gallops. No edema. Pedal pulses 2+ bilaterally.  GASTROINTESTINAL: Soft, nontender, nondistended. No masses. Positive bowel sounds. No hepatosplenomegaly.  MUSCULOSKELETAL: No swelling, clubbing, or edema. Range of motion full in all extremities.  NEUROLOGIC: Cranial nerves II through XII are intact. No gross focal neurological deficits. Sensation intact. Reflexes intact.  SKIN: No ulceration, lesions, rashes, or cyanosis. Skin warm and dry. Turgor intact.  PSYCHIATRIC: Mood, affect within normal limits. The patient is awake, alert and oriented x 3. Insight, judgment intact.       IMAGING    DG Chest 2 View  Result Date: 11/19/2021 CLINICAL DATA:  Shortness of breath EXAM: CHEST - 2 VIEW COMPARISON:  Radiograph 10/23/2018, chest CT 09/26/2021 FINDINGS: Unchanged cardiomediastinal silhouette. Prominent pulmonary arteries. Bibasilar airspace opacities. Reverse right shoulder arthroplasty. No acute osseous abnormality. Thoracic spondylosis. IMPRESSION: Bibasilar airspace opacities which could be atelectasis or developing infection. Electronically Signed   By: Maurine Simmering M.D.   On: 11/19/2021 15:13   CT Angio Chest PE W and/or Wo Contrast  Result Date: 11/19/2021 CLINICAL DATA:  Status post fall. EXAM: CT ANGIOGRAPHY CHEST WITH CONTRAST TECHNIQUE: Multidetector CT imaging of the chest was performed using the standard protocol during bolus administration of intravenous contrast. Multiplanar CT image reconstructions and MIPs were obtained to evaluate the vascular anatomy. CONTRAST:  157mL OMNIPAQUE IOHEXOL 350 MG/ML SOLN COMPARISON:  September 26, 2021 FINDINGS: Cardiovascular: Stable 4.2 cm diameter aneurysmal dilatation of the  ascending thoracic aorta is seen. Stable pulmonary artery enlargement is seen. Satisfactory opacification of the pulmonary arteries to the segmental level. No evidence of pulmonary embolism. There is mild cardiomegaly. No pericardial effusion. Mediastinum/Nodes: Mild AP window 1 mild pretracheal lymphadenopathy is seen. Thyroid gland, trachea, and esophagus demonstrate no significant findings. Lungs/Pleura: Mild linear atelectasis is seen within the posterolateral aspect of the right  apex, anteromedial aspect of the left upper lobe and posterior aspects of the bilateral lung bases. There is no evidence of acute infiltrate, pleural effusion or pneumothorax. Upper Abdomen: No acute abnormality. Musculoskeletal: Acute, anterolateral seventh and eighth left rib fractures are seen. Acute, displaced posterior ninth and tenth left rib fractures are also noted. A right shoulder replacement is noted. Multilevel degenerative changes are seen throughout the thoracic spine. Review of the MIP images confirms the above findings. IMPRESSION: 1. Acute, seventh, eighth, ninth and tenth left rib fractures, as described above. 2. Stable 4.2 cm diameter aneurysmal dilatation of the ascending thoracic aorta. 3. Stable pulmonary artery enlargement, which may represent pulmonary arterial hypertension. Electronically Signed   By: Virgina Norfolk M.D.   On: 11/19/2021 23:02   DG Chest Port 1 View  Result Date: 11/21/2021 CLINICAL DATA:  Shortness of breath, hypoxia EXAM: PORTABLE CHEST 1 VIEW COMPARISON:  Chest radiograph 11/19/2021, CTA chest 11/19/2021 FINDINGS: The heart is enlarged, unchanged. There are patchy opacities in perihilar regions and right lung base. The left costophrenic angle is not well delineated, likely due to cardiomegaly and adjacent fat as opposed to a pleural effusion. There is no right pleural effusion. There is no pneumothorax. Right shoulder arthroplasty hardware is again noted the left-sided rib fracture  seen on the prior CTA chest are not well seen on the current study. IMPRESSION: 1. Patchy opacities in the perihilar regions and right lung base may reflect atelectasis; however, developing infection could have a similar appearance. 2. Cardiomegaly. Electronically Signed   By: Valetta Mole M.D.   On: 11/21/2021 10:41   ECHOCARDIOGRAM COMPLETE  Result Date: 11/20/2021    ECHOCARDIOGRAM REPORT   Patient Name:   MAKENSIE MULHALL Sigley Date of Exam: 11/20/2021 Medical Rec #:  [id number]             Height:       64.0 in Accession #:    [id number]            Weight:       240.0 lb Date of Birth:  1946-11-23              BSA:          2.114 m Patient Age:    15 years              BP:           Not listed in chart/Not                                                     listed in chart mmHg Patient Gender: F                     HR:           Not listed in chart bpm. Exam Location:  [facility] Procedure: 2D Echo, Color Doppler and Cardiac Doppler Indications:     Pulmonary hypertension I27.2  History:         Patient has no prior history of Echocardiogram examinations.                  COPD; Risk Factors:Diabetes and Hypertension.  Sonographer:     Sherrie Sport Referring Phys:  [id number] Kathrin Ruddy ORGEL Diagnosing Phys: Donnelly Angelica  Sonographer Comments: Technically difficult study due to poor  echo windows, no parasternal window and no apical window. Image acquisition challenging due to COPD and Obtainable view was a modified low PLAX/ Apical. IMPRESSIONS  1. Left ventricular ejection fraction, by estimation, is >75%. The left ventricle has hyperdynamic function. The left ventricle has no regional wall motion abnormalities. Left ventricular diastolic function could not be evaluated. There is the interventricular septum is flattened in diastole ('D' shaped left ventricle), consistent with right ventricular volume overload.  2. Unable to estimate pulmonary pressures due to technical difficulties. Right ventricular systolic function  is moderately reduced. The right ventricular size is severely enlarged.  3. The mitral valve is degenerative. No evidence of mitral valve regurgitation. Moderate mitral annular calcification.  4. The aortic valve is calcified.  5. Aortic dilatation noted. There is mild dilatation of the ascending aorta, measuring 42 mm. Conclusion(s)/Recommendation(s): Findings consistent with pulmonary HTN, but evaluation limited by technical difficulties. FINDINGS  Left Ventricle: Left ventricular ejection fraction, by estimation, is >75%. The left ventricle has hyperdynamic function. The left ventricle has no regional wall motion abnormalities. The left ventricular internal cavity size was small. The interventricular septum is flattened in diastole ('D' shaped left ventricle), consistent with right ventricular volume overload. Left ventricular diastolic function could not be evaluated. Right Ventricle: Unable to estimate pulmonary pressures due to technical difficulties. The right ventricular size is severely enlarged. Right vetricular wall thickness was not assessed. Right ventricular systolic function is moderately reduced. Pericardium: There is no evidence of pericardial effusion. Mitral Valve: The mitral valve is degenerative in appearance. Moderate mitral annular calcification. No evidence of mitral valve regurgitation. Tricuspid Valve: The tricuspid valve is normal in structure. Aortic Valve: The aortic valve is calcified. Aorta: Aortic dilatation noted. There is mild dilatation of the ascending aorta, measuring 42 mm.  LEFT VENTRICLE PLAX 2D LVIDd:         2.90 cm LVIDs:         1.90 cm LV PW:         1.50 cm LV IVS:        1.00 cm  LEFT ATRIUM         Index LA diam:    3.80 cm 1.80 cm/m                        PULMONIC VALVE AORTA                 RVOT Peak grad: 2 mmHg Ao Root diam: 4.00 cm   SHUNTS Pulmonic VTI: 0.117 m Donnelly Angelica Electronically signed by Donnelly Angelica Signature Date/Time: 11/20/2021/5:42:36 PM    Final        ASSESSMENT/PLAN   Acute on chronic hypoxemic respiratory failure        -Does not appear to be infectious in etiology - RVP and PCR COVID negative        -Procal is low indicative of less likely bacterial etiology/infection        - there is mild leukocytosis without steroids        - no infiltrate noted on CT chest and no signs of significant scarring or ILD        -BNP with severe elevattion despite elevated BMI >40 suggestive of cardiac/PH etiology with almost normal troponin.        -Appreciate cardiology input - Dr Corky Sox        -patient has improved post diuresis >2L UOP with O2 weaned to 6L         -  PT/OT today         -reducing diuretics to lasix 40 daily, aldactone 12.5 daily. Reduced propanolol to 20 bid and midodrine to 2.5 tid with goal to dc this completely if possible   Acute decompensated diastolic CHF with EF >37%         S/p TTE    - cardiology on case -appreciate input Dr Corky Sox    - appreciate input    - UOP >2l    - patient currently on lasix 60 bid, aldactone and midodrine due to hypotension in evening time     - she is developing mild pre-renal AKI , will reduce diuresis today she is improving     Probable acute decompensated pulmonary hypertension  -   Likely Group 2 & 3 currently NYHA 4 level -cardiology on case - patient for RHC when more stable -currently being diuresed and has adequate UOP with mild AKI -TTE with RV volume overload    Advanced COPD with OSA overlap and chronic hypoxemia   - continue with CPAP QHS   -   Bibasilar atelectasis   I have encouraged patient to use IS - she can take tidal volumes of 300cc at this time   - She is very weak and deconditioined , need to be OOB and PT/OT    -Patient received metaneb therapy with RT and feels its helping    Thank you for allowing me to participate in the care of this patient.  Total face to face encounter time for this patient visit was >45 min. >50% of the time was  spent in counseling and  coordination of care.   Patient/Family are satisfied with care plan and all questions have been answered.  This document was prepared using Dragon voice recognition software and may include unintentional dictation errors.     Ottie Glazier, M.D.  Division of [place]

## 2021-11-21 NOTE — Progress Notes (Signed)
PROGRESS NOTE    Kari Medina  VFI:[id number] DOB: [birth year] DOA: 11/19/2021 PCP: Venia Carbon, MD    Brief Narrative:  75 year old female history of obesity, hypertension, hyperlipidemia, obstructive sleep apnea, COPD on chronic 2 L admitted after a fall in the shower and resultant fractured ribs.  Cardiology consulted as the patient is currently undergoing evaluation pulmonary hypertension and does have clinical evidence of fluid overload.   She was recently seen in cardiology clinic on 11/28 in which time she described progressively worsening shortness of breath over the last several years.  She was initially scheduled for outpatient right heart catheterization for evaluation of pulmonary hypertension however 1 of in the hospital   She is clinically fluid overloaded on exam.  She will be diuresed aggressively.  Cardiology is consulted and may consider right heart catheterization however this may be also deferred to outpatient.  We will optimize volume status is much as possible.  Oxygen saturation and requirement has worsened to a maximum of 12 L.  Discussed with bedside RN.  Continuous pulse oximetry in place.  Will wean down.  Chest x-ray demonstrating atelectasis versus infiltrate.  Encourage incentive spirometry use.  Check procalcitonin and BNP.     Assessment & Plan:   Principal Problem:   Acute on chronic respiratory failure with hypoxia (HCC) Active Problems:   COPD (chronic obstructive pulmonary disease) (HCC)   Generalized weakness   Obesity, Class III, BMI 40-49.9 (morbid obesity) (HCC)   Aneurysm of thoracic aorta   Hypothyroidism   Pulmonary hypertension (HCC)   Chronic kidney disease, stage 3a (HCC)   Multiple fractures of ribs, left side, initial encounter for closed fracture   Cor pulmonale (HCC)  Acute on chronic hypoxic respiratory failure Suspected pulmonary hypertension Multifactorial secondary to splinting from rib fractures Suspected  contribution from underlying COPD/OSA Dilated RV on echo indicating pulmonary hypertension Baseline requirement 4 L Plan: Aggressive diuresis, Lasix 60 mg IV twice daily Monitor kidney function electrolytes Target net -1-1.5 L daily Daily weights Strict intake and output Wean oxygen as tolerated Goal saturation 88-92% Cardiology considering right heart cath, inpatient versus outpatient Will need diuresis before RHC is considered  COPD Does not appear acutely exacerbated Patient with decreased breath sounds but no wheezing or cough Continue as needed bronchodilators  Generalized weakness Mechanical fall Therapy evaluations  Stable aneurysm of thoracic aorta 4.2 cm, stable appearance Outpatient surveillance  Hypothyroidism PTA Synthroid  Chronic kidney disease stage IIIa Renal function at baseline Close monitoring on loop diuretics  Type 2 diabetes mellitus with renal manifestations Hold oral agents Moderate sliding scale  Obesity class III BMI 40-49.9 Patient BMI 41.6 This complicates overall care and prognosis  DVT prophylaxis: SQ Lovenox Code Status: Full Family Communication: Husband at bedside 11/30 Disposition Plan: Status is: Inpatient  Remains inpatient appropriate because: Acute on chronic hypoxic respiratory failure in the setting of decompensated pulmonary hypertension       Level of care: Med-Surg  Consultants:  Cardiology-[facility]  Procedures:  None  Antimicrobials: None   Subjective: Patient seen and examined.  Reports objective improvement in shortness of breath since admission.  No pain complaints  Objective: Vitals:   11/20/21 2008 11/21/21 0029 11/21/21 0510 11/21/21 0816  BP: 119/75 102/77 128/73 129/71  Pulse: 81 93 80 (!) 45  Resp: 19 19 14 19   Temp: 98.4 F (36.9 C) 98.1 F (36.7 C) 98.2 F (36.8 C) 98 F (36.7 C)  TempSrc:      SpO2: 92% 90% Marland Kitchen)  89% (!) 84%  Weight:      Height:        Intake/Output  Summary (Last 24 hours) at 11/21/2021 1201 Last data filed at 11/21/2021 0400 Gross per 24 hour  Intake 480 ml  Output 1100 ml  Net -620 ml   Filed Weights   11/19/21 1432  Weight: 108.9 kg    Examination:  General exam: No acute distress Respiratory system: Lung sounds decreased.  Scattered crackles.  Normal work of breathing.  10 L Cardiovascular system: S1-S2, RRR, no murmurs, 2+ pitting edema Gastrointestinal system: Obese, NT/ND, normal bowel sounds Central nervous system: Alert and oriented. No focal neurological deficits. Extremities: Symmetric 5 x 5 power. Skin: No rashes, lesions or ulcers Psychiatry: Judgement and insight appear normal. Mood & affect appropriate.     Data Reviewed: I have personally reviewed following labs and imaging studies  CBC: Recent Labs  Lab 11/19/21 1432 11/20/21 0103  WBC 11.9* 12.1*  HGB 16.1* 15.4*  HCT 48.0* 46.8*  MCV 93.8 95.5  PLT 172 161   Basic Metabolic Panel: Recent Labs  Lab 11/19/21 1432 11/20/21 0103 11/21/21 0512  NA 138  --  138  K 3.5  --  3.2*  CL 102  --  102  CO2 28  --  29  GLUCOSE 103*  --  100*  BUN 22  --  20  CREATININE 1.15* 1.22* 1.10*  CALCIUM 9.2  --  8.5*   GFR: Estimated Creatinine Clearance: 54.9 mL/min (A) (by C-G formula based on SCr of 1.1 mg/dL (H)). Liver Function Tests: Recent Labs  Lab 11/19/21 1432  AST 32  ALT 19  ALKPHOS 75  BILITOT 1.6*  PROT 6.9  ALBUMIN 3.5   No results for input(s): LIPASE, AMYLASE in the last 168 hours. No results for input(s): AMMONIA in the last 168 hours. Coagulation Profile: No results for input(s): INR, PROTIME in the last 168 hours. Cardiac Enzymes: No results for input(s): CKTOTAL, CKMB, CKMBINDEX, TROPONINI in the last 168 hours. BNP (last 3 results) No results for input(s): PROBNP in the last 8760 hours. HbA1C: No results for input(s): HGBA1C in the last 72 hours. CBG: No results for input(s): GLUCAP in the last 168 hours. Lipid  Profile: No results for input(s): CHOL, HDL, LDLCALC, TRIG, CHOLHDL, LDLDIRECT in the last 72 hours. Thyroid Function Tests: No results for input(s): TSH, T4TOTAL, FREET4, T3FREE, THYROIDAB in the last 72 hours. Anemia Panel: No results for input(s): VITAMINB12, FOLATE, FERRITIN, TIBC, IRON, RETICCTPCT in the last 72 hours. Sepsis Labs: No results for input(s): PROCALCITON, LATICACIDVEN in the last 168 hours.  Recent Results (from the past 240 hour(s))  Respiratory (~20 pathogens) panel by PCR     Status: None   Collection Time: 11/20/21  1:03 AM   Specimen: Nasopharyngeal Swab; Respiratory  Result Value Ref Range Status   Adenovirus NOT DETECTED NOT DETECTED Final   Coronavirus 229E NOT DETECTED NOT DETECTED Final    Comment: (NOTE) The Coronavirus on the Respiratory Panel, DOES NOT test for the novel  Coronavirus (2019 nCoV)    Coronavirus HKU1 NOT DETECTED NOT DETECTED Final   Coronavirus NL63 NOT DETECTED NOT DETECTED Final   Coronavirus OC43 NOT DETECTED NOT DETECTED Final   Metapneumovirus NOT DETECTED NOT DETECTED Final   Rhinovirus / Enterovirus NOT DETECTED NOT DETECTED Final   Influenza A NOT DETECTED NOT DETECTED Final   Influenza B NOT DETECTED NOT DETECTED Final   Parainfluenza Virus 1 NOT DETECTED NOT DETECTED Final  Parainfluenza Virus 2 NOT DETECTED NOT DETECTED Final   Parainfluenza Virus 3 NOT DETECTED NOT DETECTED Final   Parainfluenza Virus 4 NOT DETECTED NOT DETECTED Final   Respiratory Syncytial Virus NOT DETECTED NOT DETECTED Final   Bordetella pertussis NOT DETECTED NOT DETECTED Final   Bordetella Parapertussis NOT DETECTED NOT DETECTED Final   Chlamydophila pneumoniae NOT DETECTED NOT DETECTED Final   Mycoplasma pneumoniae NOT DETECTED NOT DETECTED Final    Comment: Performed at [facility], [place] [address], [place], [name] [address]  Resp Panel by RT-PCR (Flu A&B, Covid) Nasopharyngeal Swab     Status: None   Collection Time: 11/20/21  3:59 PM    Specimen: Nasopharyngeal Swab; Nasopharyngeal(NP) swabs in vial transport medium  Result Value Ref Range Status   SARS Coronavirus 2 by RT PCR NEGATIVE NEGATIVE Final    Comment: (NOTE) SARS-CoV-2 target nucleic acids are NOT DETECTED.  The SARS-CoV-2 RNA is generally detectable in upper respiratory specimens during the acute phase of infection. The lowest concentration of SARS-CoV-2 viral copies this assay can detect is 138 copies/mL. A negative result does not preclude SARS-Cov-2 infection and should not be used as the sole basis for treatment or other patient management decisions. A negative result may occur with  improper specimen collection/handling, submission of specimen other than nasopharyngeal swab, presence of viral mutation(s) within the areas targeted by this assay, and inadequate number of viral copies(<138 copies/mL). A negative result must be combined with clinical observations, patient history, and epidemiological information. The expected result is Negative.  Fact Sheet for Patients:  EntrepreneurPulse.com.au  Fact Sheet for Healthcare Providers:  IncredibleEmployment.be  This test is no t yet approved or cleared by the [place] FDA and  has been authorized for detection and/or diagnosis of SARS-CoV-2 by FDA under an Emergency Use Authorization (EUA). This EUA will remain  in effect (meaning this test can be used) for the duration of the COVID-19 declaration under Section 564(b)(1) of the Act, 21 U.S.C.section 360bbb-3(b)(1), unless the authorization is terminated  or revoked sooner.       Influenza A by PCR NEGATIVE NEGATIVE Final   Influenza B by PCR NEGATIVE NEGATIVE Final    Comment: (NOTE) The Xpert Xpress SARS-CoV-2/FLU/RSV plus assay is intended as an aid in the diagnosis of influenza from Nasopharyngeal swab specimens and should not be used as a sole basis for treatment. Nasal washings and aspirates are  unacceptable for Xpert Xpress SARS-CoV-2/FLU/RSV testing.  Fact Sheet for Patients: EntrepreneurPulse.com.au  Fact Sheet for Healthcare Providers: IncredibleEmployment.be  This test is not yet approved or cleared by the [place] FDA and has been authorized for detection and/or diagnosis of SARS-CoV-2 by FDA under an Emergency Use Authorization (EUA). This EUA will remain in effect (meaning this test can be used) for the duration of the COVID-19 declaration under Section 564(b)(1) of the Act, 21 U.S.C. section 360bbb-3(b)(1), unless the authorization is terminated or revoked.  Performed at [facility], [address], [place], [place] [address]          Radiology Studies: DG Chest 2 View  Result Date: 11/19/2021 CLINICAL DATA:  Shortness of breath EXAM: CHEST - 2 VIEW COMPARISON:  Radiograph 10/23/2018, chest CT 09/26/2021 FINDINGS: Unchanged cardiomediastinal silhouette. Prominent pulmonary arteries. Bibasilar airspace opacities. Reverse right shoulder arthroplasty. No acute osseous abnormality. Thoracic spondylosis. IMPRESSION: Bibasilar airspace opacities which could be atelectasis or developing infection. Electronically Signed   By: Maurine Simmering M.D.   On: 11/19/2021 15:13  CT Angio Chest PE W and/or Wo Contrast  Result Date: 11/19/2021 CLINICAL DATA:  Status post fall. EXAM: CT ANGIOGRAPHY CHEST WITH CONTRAST TECHNIQUE: Multidetector CT imaging of the chest was performed using the standard protocol during bolus administration of intravenous contrast. Multiplanar CT image reconstructions and MIPs were obtained to evaluate the vascular anatomy. CONTRAST:  121mL OMNIPAQUE IOHEXOL 350 MG/ML SOLN COMPARISON:  September 26, 2021 FINDINGS: Cardiovascular: Stable 4.2 cm diameter aneurysmal dilatation of the ascending thoracic aorta is seen. Stable pulmonary artery enlargement is seen. Satisfactory opacification of the pulmonary arteries  to the segmental level. No evidence of pulmonary embolism. There is mild cardiomegaly. No pericardial effusion. Mediastinum/Nodes: Mild AP window 1 mild pretracheal lymphadenopathy is seen. Thyroid gland, trachea, and esophagus demonstrate no significant findings. Lungs/Pleura: Mild linear atelectasis is seen within the posterolateral aspect of the right apex, anteromedial aspect of the left upper lobe and posterior aspects of the bilateral lung bases. There is no evidence of acute infiltrate, pleural effusion or pneumothorax. Upper Abdomen: No acute abnormality. Musculoskeletal: Acute, anterolateral seventh and eighth left rib fractures are seen. Acute, displaced posterior ninth and tenth left rib fractures are also noted. A right shoulder replacement is noted. Multilevel degenerative changes are seen throughout the thoracic spine. Review of the MIP images confirms the above findings. IMPRESSION: 1. Acute, seventh, eighth, ninth and tenth left rib fractures, as described above. 2. Stable 4.2 cm diameter aneurysmal dilatation of the ascending thoracic aorta. 3. Stable pulmonary artery enlargement, which may represent pulmonary arterial hypertension. Electronically Signed   By: Virgina Norfolk M.D.   On: 11/19/2021 23:02   DG Chest Port 1 View  Result Date: 11/21/2021 CLINICAL DATA:  Shortness of breath, hypoxia EXAM: PORTABLE CHEST 1 VIEW COMPARISON:  Chest radiograph 11/19/2021, CTA chest 11/19/2021 FINDINGS: The heart is enlarged, unchanged. There are patchy opacities in perihilar regions and right lung base. The left costophrenic angle is not well delineated, likely due to cardiomegaly and adjacent fat as opposed to a pleural effusion. There is no right pleural effusion. There is no pneumothorax. Right shoulder arthroplasty hardware is again noted the left-sided rib fracture seen on the prior CTA chest are not well seen on the current study. IMPRESSION: 1. Patchy opacities in the perihilar regions and right  lung base may reflect atelectasis; however, developing infection could have a similar appearance. 2. Cardiomegaly. Electronically Signed   By: Valetta Mole M.D.   On: 11/21/2021 10:41   ECHOCARDIOGRAM COMPLETE  Result Date: 11/20/2021    ECHOCARDIOGRAM REPORT   Patient Name:   Kari Medina Date of Exam: 11/20/2021 Medical Rec #:  [id number]             Height:       64.0 in Accession #:    [id number]            Weight:       240.0 lb Date of Birth:  June 16, 1946              BSA:          2.114 m Patient Age:    76 years              BP:           Not listed in chart/Not  listed in chart mmHg Patient Gender: F                     HR:           Not listed in chart bpm. Exam Location:  [facility] Procedure: 2D Echo, Color Doppler and Cardiac Doppler Indications:     Pulmonary hypertension I27.2  History:         Patient has no prior history of Echocardiogram examinations.                  COPD; Risk Factors:Diabetes and Hypertension.  Sonographer:     Sherrie Sport Referring Phys:  [id number] Kathrin Ruddy ORGEL Diagnosing Phys: Donnelly Angelica  Sonographer Comments: Technically difficult study due to poor echo windows, no parasternal window and no apical window. Image acquisition challenging due to COPD and Obtainable view was a modified low PLAX/ Apical. IMPRESSIONS  1. Left ventricular ejection fraction, by estimation, is >75%. The left ventricle has hyperdynamic function. The left ventricle has no regional wall motion abnormalities. Left ventricular diastolic function could not be evaluated. There is the interventricular septum is flattened in diastole ('D' shaped left ventricle), consistent with right ventricular volume overload.  2. Unable to estimate pulmonary pressures due to technical difficulties. Right ventricular systolic function is moderately reduced. The right ventricular size is severely enlarged.  3. The mitral valve is degenerative. No evidence of mitral  valve regurgitation. Moderate mitral annular calcification.  4. The aortic valve is calcified.  5. Aortic dilatation noted. There is mild dilatation of the ascending aorta, measuring 42 mm. Conclusion(s)/Recommendation(s): Findings consistent with pulmonary HTN, but evaluation limited by technical difficulties. FINDINGS  Left Ventricle: Left ventricular ejection fraction, by estimation, is >75%. The left ventricle has hyperdynamic function. The left ventricle has no regional wall motion abnormalities. The left ventricular internal cavity size was small. The interventricular septum is flattened in diastole ('D' shaped left ventricle), consistent with right ventricular volume overload. Left ventricular diastolic function could not be evaluated. Right Ventricle: Unable to estimate pulmonary pressures due to technical difficulties. The right ventricular size is severely enlarged. Right vetricular wall thickness was not assessed. Right ventricular systolic function is moderately reduced. Pericardium: There is no evidence of pericardial effusion. Mitral Valve: The mitral valve is degenerative in appearance. Moderate mitral annular calcification. No evidence of mitral valve regurgitation. Tricuspid Valve: The tricuspid valve is normal in structure. Aortic Valve: The aortic valve is calcified. Aorta: Aortic dilatation noted. There is mild dilatation of the ascending aorta, measuring 42 mm.  LEFT VENTRICLE PLAX 2D LVIDd:         2.90 cm LVIDs:         1.90 cm LV PW:         1.50 cm LV IVS:        1.00 cm  LEFT ATRIUM         Index LA diam:    3.80 cm 1.80 cm/m                        PULMONIC VALVE AORTA                 RVOT Peak grad: 2 mmHg Ao Root diam: 4.00 cm   SHUNTS Pulmonic VTI: 0.117 m Donnelly Angelica Electronically signed by Donnelly Angelica Signature Date/Time: 11/20/2021/5:42:36 PM    Final         Scheduled Meds:  enoxaparin (LOVENOX) injection  0.5 mg/kg Subcutaneous Q24H  furosemide  60 mg Intravenous BID    mouth rinse  15 mL Mouth Rinse BID   sodium chloride flush  3 mL Intravenous Q12H   Continuous Infusions:  sodium chloride       LOS: 2 days    Time spent: 35 minutes    Sidney Ace, MD [facility]   If 7PM-7AM, please contact night-coverage  11/21/2021, 12:01 PM

## 2021-11-21 NOTE — Progress Notes (Addendum)
Called by CCMD HR dropped to 38. I was in the room repositioning patient. Patient alert oriented x4  and talking.HR back up to 80`s. MD made aware during rounding.

## 2021-11-21 NOTE — Progress Notes (Signed)
PT Cancellation Note  Patient Details Name: Kari Medina MRN: [id number] DOB: 1946-02-09   Cancelled Treatment:    Reason Eval/Treat Not Completed: Fatigue/lethargy limiting ability to participate. Patient reports she is very tired. O2 sats at rest in bed on 6 liters at 86%. RN notified. Will re-attempt later as time allows and as medically appropriate.    Rilee Knoll 11/21/2021, 11:28 AM

## 2021-11-21 NOTE — Plan of Care (Signed)
  Problem: Clinical Measurements: Goal: Respiratory complications will improve Outcome: Progressing   Problem: Clinical Measurements: Goal: Cardiovascular complication will be avoided Outcome: Progressing   

## 2021-11-21 NOTE — Consult Note (Signed)
[facility] Cardiology  CARDIOLOGY CONSULT NOTE  Patient ID: Kari Medina MRN: [id number] DOB/AGE: 26-Jul-1946 75 y.o.  Admit date: 11/19/2021 Referring Physician Ralene Muskrat Primary Physician Venia Carbon, MD Primary Cardiologist Jordan Hawks; Andrez Grime, MD Reason for Consultation Possible PH, fall  HPI:  Kari Medina is a 75 year old female with history of morbid obesity, hypertension, hyperlipidemia, obstructive sleep apnea, COPD on 2 L O2 who was admitted to the hospital after a fall resulting in multiple fractured ribs.  Cardiology is consulted because she is currently undergoing evaluation for pulmonary hypertension and appears volume overloaded on exam.  Interval history - Echo completed shows severely dilated, hypocontractile RV.  - Oxygen requirement increased to 12 L overnight.  - She denies feeling short of breath. Reasonable urine output overnight.   Review of systems complete and found to be negative unless listed above     Past Medical History:  Diagnosis Date   Asthma    Chronic hypoxemic respiratory failure ([place])    uses O2 with exertion and with CPAP   COPD (chronic obstructive pulmonary disease) (HCC)    Depression    Diabetes mellitus without complication (HCC)    Dyspnea    doe   Dysrhythmia    extra beat   Fatty liver    Fibromyalgia    Generalized osteoarthritis of multiple sites    History of hiatal hernia    Hypertension    Hypothyroidism    Melanoma ([place]) 07/2018   Right leg   OSA (obstructive sleep apnea)    Pain    chronic ruq and back pain   Panic attacks    PONV (postoperative nausea and vomiting)    after thyroidectomy   Raynaud disease    RLS (restless legs syndrome)    Spleen absent    TOLD ABSENT THEN TOLD DOES HAVE SPLEEN. PATIENT IS UNCERTAIN   Tremor, essential    Wears dentures    full upper and lower    Past Surgical History:  Procedure Laterality Date   CATARACT EXTRACTION W/PHACO Right 03/04/2021    Procedure: CATARACT EXTRACTION PHACO AND INTRAOCULAR LENS PLACEMENT (IOC) RIGHT DIABETIC 3.98 00:33.2;  Surgeon: Eulogio Bear, MD;  Location: [place];  Service: Ophthalmology;  Laterality: Right;  Diabetic - oral meds   CATARACT EXTRACTION W/PHACO Left 03/18/2021   Procedure: CATARACT EXTRACTION PHACO AND INTRAOCULAR LENS PLACEMENT ([place]) LEFT;  Surgeon: Eulogio Bear, MD;  Location: [place];  Service: Ophthalmology;  Laterality: Left;  2.96 0:27.9   CHOLECYSTECTOMY     DILATATION & CURETTAGE/HYSTEROSCOPY WITH MYOSURE N/A 11/10/2018   Procedure: DILATATION & CURETTAGE/HYSTEROSCOPY WITH MYOSURE/MYOMECTOMY;  Surgeon: Aletha Halim, MD;  Location: [place];  Service: Gynecology;  Laterality: N/A;  possible myosure.  Please use myosure scope, do not open myosure blades but have in the room   JOINT REPLACEMENT Bilateral    2008/2011   MELANOMA EXCISION  07/2018   REVERSE SHOULDER ARTHROPLASTY Right 05/28/2017   Procedure: REVERSE SHOULDER ARTHROPLASTY;  Surgeon: Corky Mull, MD;  Location: [facility];  Service: Orthopedics;  Laterality: Right;   RHINOPLASTY  1972   THYROIDECTOMY  2006   TOTAL KNEE ARTHROPLASTY     bilateral    Medications Prior to Admission  Medication Sig Dispense Refill Last Dose   acetaminophen (TYLENOL) 650 MG CR tablet Take 650 mg by mouth 3 (three) times daily.      albuterol (PROVENTIL HFA) 108 (90 Base) MCG/ACT inhaler Inhale 2 puffs into  the lungs every 6 (six) hours as needed for wheezing or shortness of breath. 18 g 2 Unknown at PRN   ALPRAZolam (XANAX) 0.25 MG tablet Take 1 tablet (0.25 mg total) by mouth 2 (two) times daily as needed for anxiety. 30 tablet 0 Unknown at PRN   aspirin EC 81 MG EC tablet Take 1 tablet (81 mg total) by mouth daily. 30 tablet 0    budesonide-formoterol (SYMBICORT) 160-4.5 MCG/ACT inhaler Inhale 2 puffs into the lungs 2 (two) times daily.   Past Week at Unknown   calcium carbonate (OS-CAL)  600 MG TABS Take 600 mg by mouth daily with breakfast.      cholecalciferol (VITAMIN D) 1000 units tablet Take 2,000 Units by mouth 2 (two) times daily.      DULoxetine (CYMBALTA) 60 MG capsule TAKE 1 CAPSULE BY MOUTH  DAILY 90 capsule 3 Past Week at Unknown   fluticasone (FLONASE) 50 MCG/ACT nasal spray Place 1 spray into both nostrils daily as needed for allergies.  3 Unknown at PRN   glucose blood (ONETOUCH VERIO) test strip Use to check blood sugar once a day. E11.9 100 each 3    hydrochlorothiazide (HYDRODIURIL) 12.5 MG tablet TAKE 1 TABLET BY MOUTH  DAILY 90 tablet 3 Past Week at Unknown   levothyroxine (SYNTHROID) 200 MCG tablet TAKE 1 TABLET BY MOUTH  DAILY BEFORE BREAKFAST 90 tablet 3 Past Week at Unknown   Magnesium 100 MG TABS Take 100 mg by mouth 2 (two) times daily.      metFORMIN (GLUCOPHAGE-XR) 500 MG 24 hr tablet TAKE 1 TABLET BY MOUTH  DAILY WITH BREAKFAST 90 tablet 3 Past Week at Unknown   montelukast (SINGULAIR) 10 MG tablet TAKE 1 TABLET BY MOUTH AT  BEDTIME 90 tablet 3 Past Week at [place] 1 each by Does not apply route daily. Use to check blood sugar once a day. E11.9 100 each 3    propranolol (INDERAL) 40 MG tablet TAKE 1 TABLET BY MOUTH  TWICE DAILY 180 tablet 3 Past Week at Unknown   rOPINIRole (REQUIP) 3 MG tablet TAKE ONE TABLET BY MOUTH AT BEDTIME 90 tablet 3 Past Week at Unknown   silver sulfADIAZINE (SILVADENE) 1 % cream Apply 1 application topically daily. 50 g 0    torsemide (DEMADEX) 20 MG tablet Take 1 tablet (20 mg total) by mouth daily as needed. 30 tablet 3 Unknown at PRN   Turmeric Curcumin 500 MG CAPS Take 500 mg by mouth at bedtime.       vitamin C (ASCORBIC ACID) 500 MG tablet Take 500 mg by mouth daily.      Vitamin E 45 MG (100 UNIT) CAPS Take 1 capsule by mouth daily.      potassium chloride (K-DUR) 10 MEQ tablet Take 1 tablet (10 mEq total) by mouth daily. (Patient not taking: Reported on 11/20/2021) 30 tablet 0 Not  Taking    Social History   Socioeconomic History   Marital status: Married    Spouse name: Not on file   Number of children: 1   Years of education: Not on file   Highest education level: Not on file  Occupational History   Occupation: Neurosurgeon    Comment: Retired  Tobacco Use   Smoking status: Former    Packs/day: 0.25    Years: 5.00    Pack years: 1.25    Types: Cigarettes    Quit date: 12/22/1988    Years  since quitting: 32.9   Smokeless tobacco: Never  Vaping Use   Vaping Use: Never used  Substance and Sexual Activity   Alcohol use: No   Drug use: No   Sexual activity: Not on file  Other Topics Concern   Not on file  Social History Narrative   1 daughter      Has living will   Husband has health care POA. Alternate would be daughter Judson Roch   Would allow resuscitation but no prolonged machines   Not sure about feeding tubes   Social Determinants of Health   Financial Resource Strain: Not on file  Food Insecurity: Not on file  Transportation Needs: Not on file  Physical Activity: Not on file  Stress: Not on file  Social Connections: Not on file  Intimate Partner Violence: Not on file    Family History  Problem Relation Age of Onset   Osteoarthritis Mother    Diabetes Mother    Cirrhosis Mother    Cancer Father    Kidney cancer Father    Bladder Cancer Father    Heart disease Brother        stents in 1 brother   Breast cancer Neg Hx       Review of systems complete and found to be negative unless listed above    PHYSICAL EXAM  General: Well developed, well nourished, in no acute distress.  HEENT:  Normocephalic and atramatic Neck:  No JVD.  Lungs: Clear bilaterally to auscultation and percussion. [place] oxygen in place. Heart: HRRR . Normal S1 and S2 without gallops or murmurs.  Abdomen: Bowel sounds are positive, abdomen soft and non-tender  Msk:  Back normal, normal gait. Normal strength and tone for age. Extremities: 2+ LE  edema Neuro: Alert and oriented X 3. Psych:  Good affect, responds appropriately  Labs:   Lab Results  Component Value Date   WBC 12.1 (H) 11/20/2021   HGB 15.4 (H) 11/20/2021   HCT 46.8 (H) 11/20/2021   MCV 95.5 11/20/2021   PLT 162 11/20/2021    Recent Labs  Lab 11/19/21 1432 11/20/21 0103 11/21/21 0512  NA 138  --  138  K 3.5  --  3.2*  CL 102  --  102  CO2 28  --  29  BUN 22  --  20  CREATININE 1.15*   < > 1.10*  CALCIUM 9.2  --  8.5*  PROT 6.9  --   --   BILITOT 1.6*  --   --   ALKPHOS 75  --   --   ALT 19  --   --   AST 32  --   --   GLUCOSE 103*  --  100*   < > = values in this interval not displayed.    No results found for: CKTOTAL, CKMB, CKMBINDEX, TROPONINI  Lab Results  Component Value Date   CHOL 136 10/29/2021   CHOL 162 01/11/2021   CHOL 173 07/06/2020   Lab Results  Component Value Date   HDL 27.90 (L) 10/29/2021   HDL 40.00 01/11/2021   HDL 45.90 07/06/2020   Lab Results  Component Value Date   LDLCALC 78 10/29/2021   LDLCALC 87 01/11/2021   LDLCALC 102 (H) 07/06/2020   Lab Results  Component Value Date   TRIG 151.0 (H) 10/29/2021   TRIG 178.0 (H) 01/11/2021   TRIG 124.0 07/06/2020   Lab Results  Component Value Date   CHOLHDL 5 10/29/2021   CHOLHDL 4  01/11/2021   CHOLHDL 4 07/06/2020   No results found for: LDLDIRECT    Radiology: DG Chest 2 View  Result Date: 11/19/2021 CLINICAL DATA:  Shortness of breath EXAM: CHEST - 2 VIEW COMPARISON:  Radiograph 10/23/2018, chest CT 09/26/2021 FINDINGS: Unchanged cardiomediastinal silhouette. Prominent pulmonary arteries. Bibasilar airspace opacities. Reverse right shoulder arthroplasty. No acute osseous abnormality. Thoracic spondylosis. IMPRESSION: Bibasilar airspace opacities which could be atelectasis or developing infection. Electronically Signed   By: Maurine Simmering M.D.   On: 11/19/2021 15:13   CT Angio Chest PE W and/or Wo Contrast  Result Date: 11/19/2021 CLINICAL DATA:  Status  post fall. EXAM: CT ANGIOGRAPHY CHEST WITH CONTRAST TECHNIQUE: Multidetector CT imaging of the chest was performed using the standard protocol during bolus administration of intravenous contrast. Multiplanar CT image reconstructions and MIPs were obtained to evaluate the vascular anatomy. CONTRAST:  149mL OMNIPAQUE IOHEXOL 350 MG/ML SOLN COMPARISON:  September 26, 2021 FINDINGS: Cardiovascular: Stable 4.2 cm diameter aneurysmal dilatation of the ascending thoracic aorta is seen. Stable pulmonary artery enlargement is seen. Satisfactory opacification of the pulmonary arteries to the segmental level. No evidence of pulmonary embolism. There is mild cardiomegaly. No pericardial effusion. Mediastinum/Nodes: Mild AP window 1 mild pretracheal lymphadenopathy is seen. Thyroid gland, trachea, and esophagus demonstrate no significant findings. Lungs/Pleura: Mild linear atelectasis is seen within the posterolateral aspect of the right apex, anteromedial aspect of the left upper lobe and posterior aspects of the bilateral lung bases. There is no evidence of acute infiltrate, pleural effusion or pneumothorax. Upper Abdomen: No acute abnormality. Musculoskeletal: Acute, anterolateral seventh and eighth left rib fractures are seen. Acute, displaced posterior ninth and tenth left rib fractures are also noted. A right shoulder replacement is noted. Multilevel degenerative changes are seen throughout the thoracic spine. Review of the MIP images confirms the above findings. IMPRESSION: 1. Acute, seventh, eighth, ninth and tenth left rib fractures, as described above. 2. Stable 4.2 cm diameter aneurysmal dilatation of the ascending thoracic aorta. 3. Stable pulmonary artery enlargement, which may represent pulmonary arterial hypertension. Electronically Signed   By: Virgina Norfolk M.D.   On: 11/19/2021 23:02   ECHOCARDIOGRAM COMPLETE  Result Date: 11/20/2021    ECHOCARDIOGRAM REPORT   Patient Name:   LATAISHA COLAN Sisneros Date of  Exam: 11/20/2021 Medical Rec #:  [id number]             Height:       64.0 in Accession #:    [id number]            Weight:       240.0 lb Date of Birth:  September 15, 1946              BSA:          2.114 m Patient Age:    46 years              BP:           Not listed in chart/Not                                                     listed in chart mmHg Patient Gender: F                     HR:           Not  listed in chart bpm. Exam Location:  [facility] Procedure: 2D Echo, Color Doppler and Cardiac Doppler Indications:     Pulmonary hypertension I27.2  History:         Patient has no prior history of Echocardiogram examinations.                  COPD; Risk Factors:Diabetes and Hypertension.  Sonographer:     Sherrie Sport Referring Phys:  [id number] Kathrin Ruddy Corry Storie Diagnosing Phys: Donnelly Angelica  Sonographer Comments: Technically difficult study due to poor echo windows, no parasternal window and no apical window. Image acquisition challenging due to COPD and Obtainable view was a modified low PLAX/ Apical. IMPRESSIONS  1. Left ventricular ejection fraction, by estimation, is >75%. The left ventricle has hyperdynamic function. The left ventricle has no regional wall motion abnormalities. Left ventricular diastolic function could not be evaluated. There is the interventricular septum is flattened in diastole ('D' shaped left ventricle), consistent with right ventricular volume overload.  2. Unable to estimate pulmonary pressures due to technical difficulties. Right ventricular systolic function is moderately reduced. The right ventricular size is severely enlarged.  3. The mitral valve is degenerative. No evidence of mitral valve regurgitation. Moderate mitral annular calcification.  4. The aortic valve is calcified.  5. Aortic dilatation noted. There is mild dilatation of the ascending aorta, measuring 42 mm. Conclusion(s)/Recommendation(s): Findings consistent with pulmonary HTN, but evaluation limited by technical difficulties.  FINDINGS  Left Ventricle: Left ventricular ejection fraction, by estimation, is >75%. The left ventricle has hyperdynamic function. The left ventricle has no regional wall motion abnormalities. The left ventricular internal cavity size was small. The interventricular septum is flattened in diastole ('D' shaped left ventricle), consistent with right ventricular volume overload. Left ventricular diastolic function could not be evaluated. Right Ventricle: Unable to estimate pulmonary pressures due to technical difficulties. The right ventricular size is severely enlarged. Right vetricular wall thickness was not assessed. Right ventricular systolic function is moderately reduced. Pericardium: There is no evidence of pericardial effusion. Mitral Valve: The mitral valve is degenerative in appearance. Moderate mitral annular calcification. No evidence of mitral valve regurgitation. Tricuspid Valve: The tricuspid valve is normal in structure. Aortic Valve: The aortic valve is calcified. Aorta: Aortic dilatation noted. There is mild dilatation of the ascending aorta, measuring 42 mm.  LEFT VENTRICLE PLAX 2D LVIDd:         2.90 cm LVIDs:         1.90 cm LV PW:         1.50 cm LV IVS:        1.00 cm  LEFT ATRIUM         Index LA diam:    3.80 cm 1.80 cm/m                        PULMONIC VALVE AORTA                 RVOT Peak grad: 2 mmHg Ao Root diam: 4.00 cm   SHUNTS Pulmonic VTI: 0.117 m Donnelly Angelica Electronically signed by Donnelly Angelica Signature Date/Time: 11/20/2021/5:42:36 PM    Final     EKG: NSR with biatrial enlargement. Right axis deviation.   Echo 05/2021-NORMAL LEFT VENTRICULAR SYSTOLIC FUNCTION  NORMAL RIGHT VENTRICULAR SYSTOLIC FUNCTION  TRIVIAL REGURGITATION NOTED (See above)  NO VALVULAR STENOSIS  TECHNICALLY DIFFICULT STUDY  POOR SOUND TRANSMISSION  LIMITED VIEWS  Closest EF: >55% (Estimated)  LVH: CONCENTRIC  Dias.FxClass: (Grade  1) relaxation abnormal, E/A reversal  Mitral: TRIVIAL MR  Tricuspid:  TRIVIAL TR  LVH: MODERATE LVH   ASSESSMENT AND PLAN:  Hera Celaya is a 75 year old female with history of morbid obesity, hypertension, hyperlipidemia, obstructive sleep apnea, COPD on 2 L O2 who was admitted to the hospital after a fall resulting in multiple fractured ribs.  Cardiology is consulted because she is currently undergoing evaluation for pulmonary hypertension and appears volume overloaded on exam.  # Mechanical fall # Left sided rib fractures # Morbid obesity, OSA, COPD on 2 L O2 # Probable severe pulmonary HTN # Acute on chronic respiratory failure The patient is admitted after a fall at home in the shower resulting in multiple rib fractures.She had no prodrome of dizziness prior and did not lose consciousness; strictly mechanical in nature. She has been experiencing some decline over the last several months and her BNP on presentation is 1100.  She appears volume overloaded on exam, though this is chronically the case and has been taking torsemide for this. Her oxygen requirement is increased significantly; CTA negative for PE, no infiltrate on CXR. Splinting may be contributing, but certainly is concerning for decompensated PH. - Echo shows severely dilated, hypocontractile RV; unable to estimate PASP - Agree with IV diuresis 60 mg lasix BID - Continue home medications - Patient may need to be evaluted at facility with [facility] specialist.  - Will consider [place] when less decompensated - Echocardiogram complete.  Signed: Andrez Grime MD 11/21/2021, 7:28 AM

## 2021-11-22 LAB — BASIC METABOLIC PANEL
Anion gap: 9 (ref 5–15)
BUN: 23 mg/dL (ref 8–23)
CO2: 29 mmol/L (ref 22–32)
Calcium: 8.4 mg/dL — ABNORMAL LOW (ref 8.9–10.3)
Chloride: 100 mmol/L (ref 98–111)
Creatinine, Ser: 1.13 mg/dL — ABNORMAL HIGH (ref 0.44–1.00)
GFR, Estimated: 51 mL/min — ABNORMAL LOW (ref >60)
Glucose, Bld: 122 mg/dL — ABNORMAL HIGH (ref 70–99)
Potassium: 3.4 mmol/L — ABNORMAL LOW (ref 3.5–5.1)
Sodium: 138 mmol/L (ref 135–145)

## 2021-11-22 LAB — SEDIMENTATION RATE: Sed Rate: 16 mm/hr (ref 0–30)

## 2021-11-22 LAB — MAGNESIUM: Magnesium: 2.5 mg/dL — ABNORMAL HIGH (ref 1.7–2.4)

## 2021-11-22 LAB — C-REACTIVE PROTEIN: CRP: 9.7 mg/dL — ABNORMAL HIGH (ref <1.0)

## 2021-11-22 LAB — GLUCOSE, CAPILLARY
Glucose-Capillary: 118 mg/dL — ABNORMAL HIGH (ref 70–99)
Glucose-Capillary: 128 mg/dL — ABNORMAL HIGH (ref 70–99)
Glucose-Capillary: 91 mg/dL (ref 70–99)
Glucose-Capillary: 83 mg/dL (ref 70–99)

## 2021-11-22 MED ORDER — FUROSEMIDE 10 MG/ML IJ SOLN
40.0000 mg | Freq: Every day | INTRAMUSCULAR | Status: DC
  Administered 2021-11-22: 10:00:00 40 mg via INTRAVENOUS
  Filled 2021-11-22: qty 4

## 2021-11-22 MED ORDER — SPIRONOLACTONE 25 MG PO TABS
12.5000 mg | ORAL_TABLET | Freq: Every day | ORAL | Status: DC
  Administered 2021-11-22 – 2021-11-23 (×2): 12.5 mg via ORAL
  Filled 2021-11-22 (×2): qty 1
  Filled 2021-11-22 (×2): qty 0.5

## 2021-11-22 MED ORDER — MIDODRINE HCL 5 MG PO TABS
2.5000 mg | ORAL_TABLET | Freq: Three times a day (TID) | ORAL | Status: DC
  Administered 2021-11-22 – 2021-11-26 (×12): 2.5 mg via ORAL
  Filled 2021-11-22 (×12): qty 1

## 2021-11-22 NOTE — Progress Notes (Signed)
[facility] Cardiology  CARDIOLOGY CONSULT NOTE  Patient ID: Kari Medina MRN: [id number] DOB/AGE: November 08, 1946 75 y.o.  Admit date: 11/19/2021 Referring Physician Ralene Muskrat Primary Physician Venia Carbon, MD Primary Cardiologist Jordan Hawks; Andrez Grime, MD Reason for Consultation Possible PH, fall  HPI:  Kari Medina is a 75 year old female with history of morbid obesity, hypertension, hyperlipidemia, obstructive sleep apnea, COPD on 2 L O2 who was admitted to the hospital after a fall resulting in multiple fractured ribs.  Cardiology is consulted because she is currently undergoing evaluation for pulmonary hypertension and appears volume overloaded on exam.  Interval history - Net negative > 1 L overnight. Oxygen weaned to 6L.  - Breathing feels stable. Didn't sleep well on CPAP.   Review of systems complete and found to be negative unless listed above     Past Medical History:  Diagnosis Date   Asthma    Chronic hypoxemic respiratory failure ([place])    uses O2 with exertion and with CPAP   COPD (chronic obstructive pulmonary disease) (HCC)    Depression    Diabetes mellitus without complication (HCC)    Dyspnea    doe   Dysrhythmia    extra beat   Fatty liver    Fibromyalgia    Generalized osteoarthritis of multiple sites    History of hiatal hernia    Hypertension    Hypothyroidism    Melanoma ([place]) 07/2018   Right leg   OSA (obstructive sleep apnea)    Pain    chronic ruq and back pain   Panic attacks    PONV (postoperative nausea and vomiting)    after thyroidectomy   Raynaud disease    RLS (restless legs syndrome)    Spleen absent    TOLD ABSENT THEN TOLD DOES HAVE SPLEEN. PATIENT IS UNCERTAIN   Tremor, essential    Wears dentures    full upper and lower    Past Surgical History:  Procedure Laterality Date   CATARACT EXTRACTION W/PHACO Right 03/04/2021   Procedure: CATARACT EXTRACTION PHACO AND INTRAOCULAR LENS PLACEMENT (IOC) RIGHT  DIABETIC 3.98 00:33.2;  Surgeon: Eulogio Bear, MD;  Location: [place];  Service: Ophthalmology;  Laterality: Right;  Diabetic - oral meds   CATARACT EXTRACTION W/PHACO Left 03/18/2021   Procedure: CATARACT EXTRACTION PHACO AND INTRAOCULAR LENS PLACEMENT ([place]) LEFT;  Surgeon: Eulogio Bear, MD;  Location: [place];  Service: Ophthalmology;  Laterality: Left;  2.96 0:27.9   CHOLECYSTECTOMY     DILATATION & CURETTAGE/HYSTEROSCOPY WITH MYOSURE N/A 11/10/2018   Procedure: DILATATION & CURETTAGE/HYSTEROSCOPY WITH MYOSURE/MYOMECTOMY;  Surgeon: Aletha Halim, MD;  Location: [place];  Service: Gynecology;  Laterality: N/A;  possible myosure.  Please use myosure scope, do not open myosure blades but have in the room   JOINT REPLACEMENT Bilateral    2008/2011   MELANOMA EXCISION  07/2018   REVERSE SHOULDER ARTHROPLASTY Right 05/28/2017   Procedure: REVERSE SHOULDER ARTHROPLASTY;  Surgeon: Corky Mull, MD;  Location: [facility];  Service: Orthopedics;  Laterality: Right;   RHINOPLASTY  1972   THYROIDECTOMY  2006   TOTAL KNEE ARTHROPLASTY     bilateral    Medications Prior to Admission  Medication Sig Dispense Refill Last Dose   acetaminophen (TYLENOL) 650 MG CR tablet Take 650 mg by mouth 3 (three) times daily.      albuterol (PROVENTIL HFA) 108 (90 Base) MCG/ACT inhaler Inhale 2 puffs into the lungs every 6 (six) hours as needed  for wheezing or shortness of breath. 18 g 2 Unknown at PRN   ALPRAZolam (XANAX) 0.25 MG tablet Take 1 tablet (0.25 mg total) by mouth 2 (two) times daily as needed for anxiety. 30 tablet 0 Unknown at PRN   aspirin EC 81 MG EC tablet Take 1 tablet (81 mg total) by mouth daily. 30 tablet 0    budesonide-formoterol (SYMBICORT) 160-4.5 MCG/ACT inhaler Inhale 2 puffs into the lungs 2 (two) times daily.   Past Week at Unknown   calcium carbonate (OS-CAL) 600 MG TABS Take 600 mg by mouth daily with breakfast.      cholecalciferol  (VITAMIN D) 1000 units tablet Take 2,000 Units by mouth 2 (two) times daily.      DULoxetine (CYMBALTA) 60 MG capsule TAKE 1 CAPSULE BY MOUTH  DAILY 90 capsule 3 Past Week at Unknown   fluticasone (FLONASE) 50 MCG/ACT nasal spray Place 1 spray into both nostrils daily as needed for allergies.  3 Unknown at PRN   glucose blood (ONETOUCH VERIO) test strip Use to check blood sugar once a day. E11.9 100 each 3    hydrochlorothiazide (HYDRODIURIL) 12.5 MG tablet TAKE 1 TABLET BY MOUTH  DAILY 90 tablet 3 Past Week at Unknown   levothyroxine (SYNTHROID) 200 MCG tablet TAKE 1 TABLET BY MOUTH  DAILY BEFORE BREAKFAST 90 tablet 3 Past Week at Unknown   Magnesium 100 MG TABS Take 100 mg by mouth 2 (two) times daily.      metFORMIN (GLUCOPHAGE-XR) 500 MG 24 hr tablet TAKE 1 TABLET BY MOUTH  DAILY WITH BREAKFAST 90 tablet 3 Past Week at Unknown   montelukast (SINGULAIR) 10 MG tablet TAKE 1 TABLET BY MOUTH AT  BEDTIME 90 tablet 3 Past Week at [place] 1 each by Does not apply route daily. Use to check blood sugar once a day. E11.9 100 each 3    propranolol (INDERAL) 40 MG tablet TAKE 1 TABLET BY MOUTH  TWICE DAILY 180 tablet 3 Past Week at Unknown   rOPINIRole (REQUIP) 3 MG tablet TAKE ONE TABLET BY MOUTH AT BEDTIME 90 tablet 3 Past Week at Unknown   silver sulfADIAZINE (SILVADENE) 1 % cream Apply 1 application topically daily. 50 g 0    torsemide (DEMADEX) 20 MG tablet Take 1 tablet (20 mg total) by mouth daily as needed. 30 tablet 3 Unknown at PRN   Turmeric Curcumin 500 MG CAPS Take 500 mg by mouth at bedtime.       vitamin C (ASCORBIC ACID) 500 MG tablet Take 500 mg by mouth daily.      Vitamin E 45 MG (100 UNIT) CAPS Take 1 capsule by mouth daily.      potassium chloride (K-DUR) 10 MEQ tablet Take 1 tablet (10 mEq total) by mouth daily. (Patient not taking: Reported on 11/20/2021) 30 tablet 0 Not Taking    Social History   Socioeconomic History   Marital status:  Married    Spouse name: Not on file   Number of children: 1   Years of education: Not on file   Highest education level: Not on file  Occupational History   Occupation: Neurosurgeon    Comment: Retired  Tobacco Use   Smoking status: Former    Packs/day: 0.25    Years: 5.00    Pack years: 1.25    Types: Cigarettes    Quit date: 12/22/1988    Years since quitting: 32.9   Smokeless tobacco: Never  Vaping Use   Vaping Use: Never used  Substance and Sexual Activity   Alcohol use: No   Drug use: No   Sexual activity: Not on file  Other Topics Concern   Not on file  Social History Narrative   1 daughter      Has living will   Husband has health care POA. Alternate would be daughter Judson Roch   Would allow resuscitation but no prolonged machines   Not sure about feeding tubes   Social Determinants of Health   Financial Resource Strain: Not on file  Food Insecurity: Not on file  Transportation Needs: Not on file  Physical Activity: Not on file  Stress: Not on file  Social Connections: Not on file  Intimate Partner Violence: Not on file    Family History  Problem Relation Age of Onset   Osteoarthritis Mother    Diabetes Mother    Cirrhosis Mother    Cancer Father    Kidney cancer Father    Bladder Cancer Father    Heart disease Brother        stents in 1 brother   Breast cancer Neg Hx       Review of systems complete and found to be negative unless listed above    PHYSICAL EXAM  General: Well developed, well nourished, in no acute distress.  HEENT:  Normocephalic and atramatic Neck:  No JVD.  Lungs: Clear bilaterally to auscultation and percussion.  oxygen in place. Heart: HRRR . Normal S1 and S2 without gallops or murmurs.  Abdomen: Bowel sounds are positive, abdomen soft and non-tender  Msk:  Back normal, normal gait. Normal strength and tone for age. Extremities: 2+ LE edema Neuro: Alert and oriented X 3. Psych:  Good affect, responds  appropriately  Labs:   Lab Results  Component Value Date   WBC 12.1 (H) 11/20/2021   HGB 15.4 (H) 11/20/2021   HCT 46.8 (H) 11/20/2021   MCV 95.5 11/20/2021   PLT 162 11/20/2021    Recent Labs  Lab 11/19/21 1432 11/20/21 0103 11/22/21 0339  NA 138   < > 138  K 3.5   < > 3.4*  CL 102   < > 100  CO2 28   < > 29  BUN 22   < > 23  CREATININE 1.15*   < > 1.13*  CALCIUM 9.2   < > 8.4*  PROT 6.9  --   --   BILITOT 1.6*  --   --   ALKPHOS 75  --   --   ALT 19  --   --   AST 32  --   --   GLUCOSE 103*   < > 122*   < > = values in this interval not displayed.    No results found for: CKTOTAL, CKMB, CKMBINDEX, TROPONINI  Lab Results  Component Value Date   CHOL 136 10/29/2021   CHOL 162 01/11/2021   CHOL 173 07/06/2020   Lab Results  Component Value Date   HDL 27.90 (L) 10/29/2021   HDL 40.00 01/11/2021   HDL 45.90 07/06/2020   Lab Results  Component Value Date   LDLCALC 78 10/29/2021   LDLCALC 87 01/11/2021   LDLCALC 102 (H) 07/06/2020   Lab Results  Component Value Date   TRIG 151.0 (H) 10/29/2021   TRIG 178.0 (H) 01/11/2021   TRIG 124.0 07/06/2020   Lab Results  Component Value Date   CHOLHDL 5 10/29/2021   CHOLHDL 4 01/11/2021  CHOLHDL 4 07/06/2020   No results found for: LDLDIRECT    Radiology: DG Chest 2 View  Result Date: 11/19/2021 CLINICAL DATA:  Shortness of breath EXAM: CHEST - 2 VIEW COMPARISON:  Radiograph 10/23/2018, chest CT 09/26/2021 FINDINGS: Unchanged cardiomediastinal silhouette. Prominent pulmonary arteries. Bibasilar airspace opacities. Reverse right shoulder arthroplasty. No acute osseous abnormality. Thoracic spondylosis. IMPRESSION: Bibasilar airspace opacities which could be atelectasis or developing infection. Electronically Signed   By: Maurine Simmering M.D.   On: 11/19/2021 15:13   CT Angio Chest PE W and/or Wo Contrast  Result Date: 11/19/2021 CLINICAL DATA:  Status post fall. EXAM: CT ANGIOGRAPHY CHEST WITH CONTRAST TECHNIQUE:  Multidetector CT imaging of the chest was performed using the standard protocol during bolus administration of intravenous contrast. Multiplanar CT image reconstructions and MIPs were obtained to evaluate the vascular anatomy. CONTRAST:  182mL OMNIPAQUE IOHEXOL 350 MG/ML SOLN COMPARISON:  September 26, 2021 FINDINGS: Cardiovascular: Stable 4.2 cm diameter aneurysmal dilatation of the ascending thoracic aorta is seen. Stable pulmonary artery enlargement is seen. Satisfactory opacification of the pulmonary arteries to the segmental level. No evidence of pulmonary embolism. There is mild cardiomegaly. No pericardial effusion. Mediastinum/Nodes: Mild AP window 1 mild pretracheal lymphadenopathy is seen. Thyroid gland, trachea, and esophagus demonstrate no significant findings. Lungs/Pleura: Mild linear atelectasis is seen within the posterolateral aspect of the right apex, anteromedial aspect of the left upper lobe and posterior aspects of the bilateral lung bases. There is no evidence of acute infiltrate, pleural effusion or pneumothorax. Upper Abdomen: No acute abnormality. Musculoskeletal: Acute, anterolateral seventh and eighth left rib fractures are seen. Acute, displaced posterior ninth and tenth left rib fractures are also noted. A right shoulder replacement is noted. Multilevel degenerative changes are seen throughout the thoracic spine. Review of the MIP images confirms the above findings. IMPRESSION: 1. Acute, seventh, eighth, ninth and tenth left rib fractures, as described above. 2. Stable 4.2 cm diameter aneurysmal dilatation of the ascending thoracic aorta. 3. Stable pulmonary artery enlargement, which may represent pulmonary arterial hypertension. Electronically Signed   By: Virgina Norfolk M.D.   On: 11/19/2021 23:02   DG Chest Port 1 View  Result Date: 11/21/2021 CLINICAL DATA:  Shortness of breath, hypoxia EXAM: PORTABLE CHEST 1 VIEW COMPARISON:  Chest radiograph 11/19/2021, CTA chest 11/19/2021  FINDINGS: The heart is enlarged, unchanged. There are patchy opacities in perihilar regions and right lung base. The left costophrenic angle is not well delineated, likely due to cardiomegaly and adjacent fat as opposed to a pleural effusion. There is no right pleural effusion. There is no pneumothorax. Right shoulder arthroplasty hardware is again noted the left-sided rib fracture seen on the prior CTA chest are not well seen on the current study. IMPRESSION: 1. Patchy opacities in the perihilar regions and right lung base may reflect atelectasis; however, developing infection could have a similar appearance. 2. Cardiomegaly. Electronically Signed   By: Valetta Mole M.D.   On: 11/21/2021 10:41   ECHOCARDIOGRAM COMPLETE  Result Date: 11/20/2021    ECHOCARDIOGRAM REPORT   Patient Name:   Kari Medina [place] Date of Exam: 11/20/2021 Medical Rec #:  [id number]             Height:       64.0 in Accession #:    [id number]            Weight:       240.0 lb Date of Birth:  1946-11-11  BSA:          2.114 m Patient Age:    89 years              BP:           Not listed in chart/Not                                                     listed in chart mmHg Patient Gender: F                     HR:           Not listed in chart bpm. Exam Location:  [facility] Procedure: 2D Echo, Color Doppler and Cardiac Doppler Indications:     Pulmonary hypertension I27.2  History:         Patient has no prior history of Echocardiogram examinations.                  COPD; Risk Factors:Diabetes and Hypertension.  Sonographer:     Sherrie Sport Referring Phys:  [id number] Kathrin Ruddy Caressa Scearce Diagnosing Phys: Donnelly Angelica  Sonographer Comments: Technically difficult study due to poor echo windows, no parasternal window and no apical window. Image acquisition challenging due to COPD and Obtainable view was a modified low PLAX/ Apical. IMPRESSIONS  1. Left ventricular ejection fraction, by estimation, is >75%. The left ventricle has hyperdynamic  function. The left ventricle has no regional wall motion abnormalities. Left ventricular diastolic function could not be evaluated. There is the interventricular septum is flattened in diastole ('D' shaped left ventricle), consistent with right ventricular volume overload.  2. Unable to estimate pulmonary pressures due to technical difficulties. Right ventricular systolic function is moderately reduced. The right ventricular size is severely enlarged.  3. The mitral valve is degenerative. No evidence of mitral valve regurgitation. Moderate mitral annular calcification.  4. The aortic valve is calcified.  5. Aortic dilatation noted. There is mild dilatation of the ascending aorta, measuring 42 mm. Conclusion(s)/Recommendation(s): Findings consistent with pulmonary HTN, but evaluation limited by technical difficulties. FINDINGS  Left Ventricle: Left ventricular ejection fraction, by estimation, is >75%. The left ventricle has hyperdynamic function. The left ventricle has no regional wall motion abnormalities. The left ventricular internal cavity size was small. The interventricular septum is flattened in diastole ('D' shaped left ventricle), consistent with right ventricular volume overload. Left ventricular diastolic function could not be evaluated. Right Ventricle: Unable to estimate pulmonary pressures due to technical difficulties. The right ventricular size is severely enlarged. Right vetricular wall thickness was not assessed. Right ventricular systolic function is moderately reduced. Pericardium: There is no evidence of pericardial effusion. Mitral Valve: The mitral valve is degenerative in appearance. Moderate mitral annular calcification. No evidence of mitral valve regurgitation. Tricuspid Valve: The tricuspid valve is normal in structure. Aortic Valve: The aortic valve is calcified. Aorta: Aortic dilatation noted. There is mild dilatation of the ascending aorta, measuring 42 mm.  LEFT VENTRICLE PLAX 2D  LVIDd:         2.90 cm LVIDs:         1.90 cm LV PW:         1.50 cm LV IVS:        1.00 cm  LEFT ATRIUM         Index LA diam:  3.80 cm 1.80 cm/m                        PULMONIC VALVE AORTA                 RVOT Peak grad: 2 mmHg Ao Root diam: 4.00 cm   SHUNTS Pulmonic VTI: 0.117 m Donnelly Angelica Electronically signed by Donnelly Angelica Signature Date/Time: 11/20/2021/5:42:36 PM    Final     EKG: NSR with biatrial enlargement. Right axis deviation.   Echo 05/2021-NORMAL LEFT VENTRICULAR SYSTOLIC FUNCTION  NORMAL RIGHT VENTRICULAR SYSTOLIC FUNCTION  TRIVIAL REGURGITATION NOTED (See above)  NO VALVULAR STENOSIS  TECHNICALLY DIFFICULT STUDY  POOR SOUND TRANSMISSION  LIMITED VIEWS  Closest EF: >55% (Estimated)  LVH: CONCENTRIC  Dias.FxClass: (Grade 1) relaxation abnormal, E/A reversal  Mitral: TRIVIAL MR  Tricuspid: TRIVIAL TR  LVH: MODERATE LVH   ASSESSMENT AND PLAN:  Kari Medina is a 75 year old female with history of morbid obesity, hypertension, hyperlipidemia, obstructive sleep apnea, COPD on 2 L O2 who was admitted to the hospital after a fall resulting in multiple fractured ribs.  Cardiology is consulted because she is currently undergoing evaluation for pulmonary hypertension and appears volume overloaded on exam.  # Mechanical fall # Left sided rib fractures # Morbid obesity, OSA, COPD on 2 L O2 # Probable severe pulmonary HTN # Acute on chronic respiratory failure The patient is admitted after a fall at home in the shower resulting in multiple rib fractures.She had no prodrome of dizziness prior and did not lose consciousness; strictly mechanical in nature. She has been experiencing some decline over the last several months and her BNP on presentation is 1100.  She appears volume overloaded on exam, though this is chronically the case and has been taking torsemide for this. Her oxygen requirement is increased significantly; CTA negative for PE, no infiltrate on CXR. Splinting may be  contributing, but certainly is concerning for decompensated PH. - Echo shows severely dilated, hypocontractile RV; unable to estimate PASP - Agree with IV diuresis 40 mg lasix - Continue home medications - Pulmonary consulted for assistance. Spironolactone and midodrine started per their request.  - Will consider RHC when less decompensated  Signed: Andrez Grime MD 11/22/2021, 7:57 AM

## 2021-11-22 NOTE — Progress Notes (Signed)
Pulmonary Medicine          Date: 11/22/2021,   MRN# [id number] Kari Medina 1946/03/22     AdmissionWeight: 108.9 kg                 CurrentWeight: 107 kg   Referring physician: Dr Corky Sox   CHIEF COMPLAINT:   Increased O2 requirement with acute on chronic hypoxemic respiratory failure   HISTORY OF PRESENT ILLNESS   This is a pleasant 75 yo F with hx of COPD and OSA overlap syndrome chronically hypoxemic with CPAP and O2 bleed in at home, with extensive comborbid history including MDD, dyslipidemia, hiatal hernia, RLS, Asplenic status, raynauds, fibromyalgia, PONV, RLS came in to hospital post mechanical fall with trauma to torso and resultant rib fractures.  She is being evaluated with cardiology for CHF and PH and is noted to have severe hypoxemia requiring upwards of 12L/min supplemental O2.  She had CTPE done which I reviewed, there is very generous heart size and bibasilar atelectasis but entire lung appears with interstitial edemaPCCM consulted for further evaluation and management.    11/22/21- patient states she feels better. She would like to get OOB to chair. She is using IS.  She continues to urinate.  PT/OT is ordered.   PAST MEDICAL HISTORY   Past Medical History:  Diagnosis Date   Asthma    Chronic hypoxemic respiratory failure (HCC)    uses O2 with exertion and with CPAP   COPD (chronic obstructive pulmonary disease) (HCC)    Depression    Diabetes mellitus without complication (HCC)    Dyspnea    doe   Dysrhythmia    extra beat   Fatty liver    Fibromyalgia    Generalized osteoarthritis of multiple sites    History of hiatal hernia    Hypertension    Hypothyroidism    Melanoma ([place]) 07/2018   Right leg   OSA (obstructive sleep apnea)    Pain    chronic ruq and back pain   Panic attacks    PONV (postoperative nausea and vomiting)    after thyroidectomy   Raynaud disease    RLS (restless legs syndrome)    Spleen absent    TOLD  ABSENT THEN TOLD DOES HAVE SPLEEN. PATIENT IS UNCERTAIN   Tremor, essential    Wears dentures    full upper and lower     SURGICAL HISTORY   Past Surgical History:  Procedure Laterality Date   CATARACT EXTRACTION W/PHACO Right 03/04/2021   Procedure: CATARACT EXTRACTION PHACO AND INTRAOCULAR LENS PLACEMENT ([facility]) RIGHT DIABETIC 3.98 00:33.2;  Surgeon: Eulogio Bear, MD;  Location: [place];  Service: Ophthalmology;  Laterality: Right;  Diabetic - oral meds   CATARACT EXTRACTION W/PHACO Left 03/18/2021   Procedure: CATARACT EXTRACTION PHACO AND INTRAOCULAR LENS PLACEMENT ([place]) LEFT;  Surgeon: Eulogio Bear, MD;  Location: [place];  Service: Ophthalmology;  Laterality: Left;  2.96 0:27.9   CHOLECYSTECTOMY     DILATATION & CURETTAGE/HYSTEROSCOPY WITH MYOSURE N/A 11/10/2018   Procedure: DILATATION & CURETTAGE/HYSTEROSCOPY WITH MYOSURE/MYOMECTOMY;  Surgeon: Aletha Halim, MD;  Location: [place];  Service: Gynecology;  Laterality: N/A;  possible myosure.  Please use myosure scope, do not open myosure blades but have in the room   JOINT REPLACEMENT Bilateral    2008/2011   MELANOMA EXCISION  07/2018   REVERSE SHOULDER ARTHROPLASTY Right 05/28/2017   Procedure: REVERSE SHOULDER ARTHROPLASTY;  Surgeon: Corky Mull,  MD;  Location: [facility];  Service: Orthopedics;  Laterality: Right;   RHINOPLASTY  1972   THYROIDECTOMY  2006   TOTAL KNEE ARTHROPLASTY     bilateral     FAMILY HISTORY   Family History  Problem Relation Age of Onset   Osteoarthritis Mother    Diabetes Mother    Cirrhosis Mother    Cancer Father    Kidney cancer Father    Bladder Cancer Father    Heart disease Brother        stents in 1 brother   Breast cancer Neg Hx      SOCIAL HISTORY   Social History   Tobacco Use   Smoking status: Former    Packs/day: 0.25    Years: 5.00    Pack years: 1.25    Types: Cigarettes    Quit date: 12/22/1988    Years since  quitting: 32.9   Smokeless tobacco: Never  Vaping Use   Vaping Use: Never used  Substance Use Topics   Alcohol use: No   Drug use: No     MEDICATIONS    Home Medication:    Current Medication:  Current Facility-Administered Medications:    0.9 %  sodium chloride infusion, 250 mL, Intravenous, PRN, Athena Masse, MD   acetaminophen (TYLENOL) tablet 650 mg, 650 mg, Oral, Q4H PRN, Athena Masse, MD   albuterol (PROVENTIL) (2.5 MG/3ML) 0.083% nebulizer solution 2.5 mg, 2.5 mg, Nebulization, Q6H PRN, Belue, Nathan S, RPH   ALPRAZolam Duanne Moron) tablet 0.25 mg, 0.25 mg, Oral, BID PRN, Judd Gaudier V, MD, 0.25 mg at 11/22/21 0539   aspirin EC tablet 81 mg, 81 mg, Oral, Daily, Sreenath, Sudheer B, MD, 81 mg at 11/22/21 1015   DULoxetine (CYMBALTA) DR capsule 60 mg, 60 mg, Oral, Daily, Sreenath, Sudheer B, MD, 60 mg at 11/22/21 1017   enoxaparin (LOVENOX) injection 55 mg, 0.5 mg/kg, Subcutaneous, Q24H, Judd Gaudier V, MD, 55 mg at 11/22/21 1020   fluticasone (FLONASE) 50 MCG/ACT nasal spray 1 spray, 1 spray, Each Nare, Daily PRN, Priscella Mann, Sudheer B, MD   furosemide (LASIX) injection 40 mg, 40 mg, Intravenous, Daily, Lanney Gins, Trip Cavanagh, MD, 40 mg at 11/22/21 1021   HYDROcodone-acetaminophen (NORCO/VICODIN) 5-325 MG per tablet 1 tablet, 1 tablet, Oral, Q4H PRN, Athena Masse, MD, 1 tablet at 11/22/21 1018   insulin aspart (novoLOG) injection 0-15 Units, 0-15 Units, Subcutaneous, TID WC, Sreenath, Sudheer B, MD   insulin aspart (novoLOG) injection 0-5 Units, 0-5 Units, Subcutaneous, QHS, Sreenath, Sudheer B, MD   ipratropium-albuterol (DUONEB) 0.5-2.5 (3) MG/3ML nebulizer solution 3 mL, 3 mL, Nebulization, Q4H, Sreenath, Sudheer B, MD, 3 mL at 11/22/21 1059   levothyroxine (SYNTHROID) tablet 200 mcg, 200 mcg, Oral, Q0600, Priscella Mann, Sudheer B, MD, 200 mcg at 11/22/21 0539   MEDLINE mouth rinse, 15 mL, Mouth Rinse, BID, Judd Gaudier V, MD, 15 mL at 11/22/21 1022   midodrine (PROAMATINE) tablet  2.5 mg, 2.5 mg, Oral, TID WC, Eriko Economos, MD, 2.5 mg at 11/22/21 1016   mometasone-formoterol (DULERA) 200-5 MCG/ACT inhaler 2 puff, 2 puff, Inhalation, BID, Sreenath, Sudheer B, MD, 2 puff at 11/22/21 1022   montelukast (SINGULAIR) tablet 10 mg, 10 mg, Oral, QHS, Sreenath, Sudheer B, MD, 10 mg at 11/21/21 2241   morphine 2 MG/ML injection 2 mg, 2 mg, Intravenous, Q2H PRN, Athena Masse, MD   ondansetron [facility]) injection 4 mg, 4 mg, Intravenous, Q6H PRN, Athena Masse, MD   propranolol (INDERAL) tablet 20 mg,  20 mg, Oral, BID, Ottie Glazier, MD, 20 mg at 11/22/21 1018   rOPINIRole (REQUIP) tablet 3 mg, 3 mg, Oral, QHS, Sreenath, Sudheer B, MD, 3 mg at 11/21/21 2241   sodium chloride flush (NS) 0.9 % injection 3 mL, 3 mL, Intravenous, Q12H, Athena Masse, MD, 3 mL at 11/22/21 1027   sodium chloride flush (NS) 0.9 % injection 3 mL, 3 mL, Intravenous, PRN, Athena Masse, MD   spironolactone (ALDACTONE) tablet 12.5 mg, 12.5 mg, Oral, Daily, Lanney Gins, Anmol Fleck, MD, 12.5 mg at 11/22/21 1017    ALLERGIES   Latex, Nickel, Pramipexole, Prednisone, Topiramate, Citalopram, Paroxetine hcl, and Sertraline     REVIEW OF SYSTEMS    Review of Systems:  Gen:  Denies  fever, sweats, chills weigh loss  HEENT: Denies blurred vision, double vision, ear pain, eye pain, hearing loss, nose bleeds, sore throat Cardiac:  No dizziness, chest pain or heaviness, chest tightness,edema Resp:   Denies cough or sputum porduction, shortness of breath,wheezing, hemoptysis,  Gi: Denies swallowing difficulty, stomach pain, nausea or vomiting, diarrhea, constipation, bowel incontinence Gu:  Denies bladder incontinence, burning urine Ext:   Denies Joint pain, stiffness or swelling Skin: Denies  skin rash, easy bruising or bleeding or hives Endoc:  Denies polyuria, polydipsia , polyphagia or weight change Psych:   Denies depression, insomnia or hallucinations   Other:  All other systems negative   VS:  BP 113/69 (BP Location: Right Arm)   Pulse 89   Temp 97.6 F (36.4 C) (Oral)   Resp 20   Ht 5\' 4"  (1.626 m)   Wt 107 kg   SpO2 (!) 87%   BMI 40.51 kg/m      PHYSICAL EXAM    GENERAL:NAD, no fevers, chills, no weakness no fatigue HEAD: Normocephalic, atraumatic.  EYES: Pupils equal, round, reactive to light. Extraocular muscles intact. No scleral icterus.  MOUTH: Moist mucosal membrane. Dentition intact. No abscess noted.  EAR, NOSE, THROAT: Clear without exudates. No external lesions.  NECK: Supple. No thyromegaly. No nodules. No JVD.  PULMONARY: mild bibasilar rhonchi CARDIOVASCULAR: S1 and S2. Regular rate and rhythm. No murmurs, rubs, or gallops. No edema. Pedal pulses 2+ bilaterally.  GASTROINTESTINAL: Soft, nontender, nondistended. No masses. Positive bowel sounds. No hepatosplenomegaly.  MUSCULOSKELETAL: No swelling, clubbing, or edema. Range of motion full in all extremities.  NEUROLOGIC: Cranial nerves II through XII are intact. No gross focal neurological deficits. Sensation intact. Reflexes intact.  SKIN: No ulceration, lesions, rashes, or cyanosis. Skin warm and dry. Turgor intact.  PSYCHIATRIC: Mood, affect within normal limits. The patient is awake, alert and oriented x 3. Insight, judgment intact.       IMAGING    DG Chest 2 View  Result Date: 11/19/2021 CLINICAL DATA:  Shortness of breath EXAM: CHEST - 2 VIEW COMPARISON:  Radiograph 10/23/2018, chest CT 09/26/2021 FINDINGS: Unchanged cardiomediastinal silhouette. Prominent pulmonary arteries. Bibasilar airspace opacities. Reverse right shoulder arthroplasty. No acute osseous abnormality. Thoracic spondylosis. IMPRESSION: Bibasilar airspace opacities which could be atelectasis or developing infection. Electronically Signed   By: Maurine Simmering M.D.   On: 11/19/2021 15:13   CT Angio Chest PE W and/or Wo Contrast  Result Date: 11/19/2021 CLINICAL DATA:  Status post fall. EXAM: CT ANGIOGRAPHY CHEST WITH CONTRAST  TECHNIQUE: Multidetector CT imaging of the chest was performed using the standard protocol during bolus administration of intravenous contrast. Multiplanar CT image reconstructions and MIPs were obtained to evaluate the vascular anatomy. CONTRAST:  133mL OMNIPAQUE IOHEXOL 350 MG/ML SOLN  COMPARISON:  September 26, 2021 FINDINGS: Cardiovascular: Stable 4.2 cm diameter aneurysmal dilatation of the ascending thoracic aorta is seen. Stable pulmonary artery enlargement is seen. Satisfactory opacification of the pulmonary arteries to the segmental level. No evidence of pulmonary embolism. There is mild cardiomegaly. No pericardial effusion. Mediastinum/Nodes: Mild AP window 1 mild pretracheal lymphadenopathy is seen. Thyroid gland, trachea, and esophagus demonstrate no significant findings. Lungs/Pleura: Mild linear atelectasis is seen within the posterolateral aspect of the right apex, anteromedial aspect of the left upper lobe and posterior aspects of the bilateral lung bases. There is no evidence of acute infiltrate, pleural effusion or pneumothorax. Upper Abdomen: No acute abnormality. Musculoskeletal: Acute, anterolateral seventh and eighth left rib fractures are seen. Acute, displaced posterior ninth and tenth left rib fractures are also noted. A right shoulder replacement is noted. Multilevel degenerative changes are seen throughout the thoracic spine. Review of the MIP images confirms the above findings. IMPRESSION: 1. Acute, seventh, eighth, ninth and tenth left rib fractures, as described above. 2. Stable 4.2 cm diameter aneurysmal dilatation of the ascending thoracic aorta. 3. Stable pulmonary artery enlargement, which may represent pulmonary arterial hypertension. Electronically Signed   By: Virgina Norfolk M.D.   On: 11/19/2021 23:02   DG Chest Port 1 View  Result Date: 11/21/2021 CLINICAL DATA:  Shortness of breath, hypoxia EXAM: PORTABLE CHEST 1 VIEW COMPARISON:  Chest radiograph 11/19/2021, CTA chest  11/19/2021 FINDINGS: The heart is enlarged, unchanged. There are patchy opacities in perihilar regions and right lung base. The left costophrenic angle is not well delineated, likely due to cardiomegaly and adjacent fat as opposed to a pleural effusion. There is no right pleural effusion. There is no pneumothorax. Right shoulder arthroplasty hardware is again noted the left-sided rib fracture seen on the prior CTA chest are not well seen on the current study. IMPRESSION: 1. Patchy opacities in the perihilar regions and right lung base may reflect atelectasis; however, developing infection could have a similar appearance. 2. Cardiomegaly. Electronically Signed   By: Valetta Mole M.D.   On: 11/21/2021 10:41   ECHOCARDIOGRAM COMPLETE  Result Date: 11/20/2021    ECHOCARDIOGRAM REPORT   Patient Name:   KIASIA CHOU [place] Date of Exam: 11/20/2021 Medical Rec #:  [id number]             Height:       64.0 in Accession #:    [id number]            Weight:       240.0 lb Date of Birth:  February 06, 1946              BSA:          2.114 m Patient Age:    42 years              BP:           Not listed in chart/Not                                                     listed in chart mmHg Patient Gender: F                     HR:           Not listed in chart bpm. Exam Location:  [facility] Procedure: 2D Echo, Color  Doppler and Cardiac Doppler Indications:     Pulmonary hypertension I27.2  History:         Patient has no prior history of Echocardiogram examinations.                  COPD; Risk Factors:Diabetes and Hypertension.  Sonographer:     Sherrie Sport Referring Phys:  [id number] Kathrin Ruddy ORGEL Diagnosing Phys: Donnelly Angelica  Sonographer Comments: Technically difficult study due to poor echo windows, no parasternal window and no apical window. Image acquisition challenging due to COPD and Obtainable view was a modified low PLAX/ Apical. IMPRESSIONS  1. Left ventricular ejection fraction, by estimation, is >75%. The left ventricle has  hyperdynamic function. The left ventricle has no regional wall motion abnormalities. Left ventricular diastolic function could not be evaluated. There is the interventricular septum is flattened in diastole ('D' shaped left ventricle), consistent with right ventricular volume overload.  2. Unable to estimate pulmonary pressures due to technical difficulties. Right ventricular systolic function is moderately reduced. The right ventricular size is severely enlarged.  3. The mitral valve is degenerative. No evidence of mitral valve regurgitation. Moderate mitral annular calcification.  4. The aortic valve is calcified.  5. Aortic dilatation noted. There is mild dilatation of the ascending aorta, measuring 42 mm. Conclusion(s)/Recommendation(s): Findings consistent with pulmonary HTN, but evaluation limited by technical difficulties. FINDINGS  Left Ventricle: Left ventricular ejection fraction, by estimation, is >75%. The left ventricle has hyperdynamic function. The left ventricle has no regional wall motion abnormalities. The left ventricular internal cavity size was small. The interventricular septum is flattened in diastole ('D' shaped left ventricle), consistent with right ventricular volume overload. Left ventricular diastolic function could not be evaluated. Right Ventricle: Unable to estimate pulmonary pressures due to technical difficulties. The right ventricular size is severely enlarged. Right vetricular wall thickness was not assessed. Right ventricular systolic function is moderately reduced. Pericardium: There is no evidence of pericardial effusion. Mitral Valve: The mitral valve is degenerative in appearance. Moderate mitral annular calcification. No evidence of mitral valve regurgitation. Tricuspid Valve: The tricuspid valve is normal in structure. Aortic Valve: The aortic valve is calcified. Aorta: Aortic dilatation noted. There is mild dilatation of the ascending aorta, measuring 42 mm.  LEFT VENTRICLE  PLAX 2D LVIDd:         2.90 cm LVIDs:         1.90 cm LV PW:         1.50 cm LV IVS:        1.00 cm  LEFT ATRIUM         Index LA diam:    3.80 cm 1.80 cm/m                        PULMONIC VALVE AORTA                 RVOT Peak grad: 2 mmHg Ao Root diam: 4.00 cm   SHUNTS Pulmonic VTI: 0.117 m Donnelly Angelica Electronically signed by Donnelly Angelica Signature Date/Time: 11/20/2021/5:42:36 PM    Final       ASSESSMENT/PLAN   Acute on chronic hypoxemic respiratory failure        -Does not appear to be infectious in etiology - RVP and PCR COVID negative        -Procal is low indicative of less likely bacterial etiology/infection        - there is mild leukocytosis without steroids        -  no infiltrate noted on CT chest and no signs of significant scarring or ILD        -BNP with severe elevattion despite elevated BMI >40 suggestive of cardiac/PH etiology with almost normal troponin.        -Appreciate cardiology input - Dr Corky Sox        -patient has improved post diuresis >2L UOP with O2 weaned to 6L         -PT/OT today         -reviewed medications - appreciate everyone caring for this lady  Acute decompensated diastolic CHF with EF >84%         S/p TTE    - cardiology on case -appreciate input Dr Corky Sox    - appreciate input    - UOP >2l    - patient currently on lasix 60 bid, aldactone and midodrine due to hypotension in evening time     - she is developing mild pre-renal AKI , will reduce diuresis today she is improving     Probable acute decompensated pulmonary hypertension  -   Likely Group 2 & 3 currently NYHA 4 level -cardiology on case - patient for RHC when more stable -currently being diuresed and has adequate UOP with mild AKI -TTE with RV volume overload    Advanced COPD with OSA overlap and chronic hypoxemia   - continue with CPAP QHS   -   Bibasilar atelectasis   I have encouraged patient to use IS - she can take tidal volumes of 300cc at this time   - She is very weak and  deconditioined , need to be OOB and PT/OT    -Patient received metaneb therapy with RT and feels its helping    Thank you for allowing me to participate in the care of this patient.   Patient/Family are satisfied with care plan and all questions have been answered.  This document was prepared using Dragon voice recognition software and may include unintentional dictation errors.     Ottie Glazier, M.D.  Division of [place]

## 2021-11-22 NOTE — Progress Notes (Signed)
PROGRESS NOTE    Kari Medina  LNL:[id number] DOB: [birth year] DOA: 11/19/2021 PCP: Venia Carbon, MD    Brief Narrative:  75 year old female history of obesity, hypertension, hyperlipidemia, obstructive sleep apnea, COPD on chronic 2 L admitted after a fall in the shower and resultant fractured ribs.  Cardiology consulted as the patient is currently undergoing evaluation pulmonary hypertension and does have clinical evidence of fluid overload.   She was recently seen in cardiology clinic on 11/28 in which time she described progressively worsening shortness of breath over the last several years.  She was initially scheduled for outpatient right heart catheterization for evaluation of pulmonary hypertension however 1 of in the hospital   She is clinically fluid overloaded on exam.  She will be diuresed aggressively.  Cardiology is consulted and may consider right heart catheterization however this may be also deferred to outpatient.  We will optimize volume status is much as possible.  Oxygen saturation and requirement has worsened to a maximum of 12 L.  Concern for worsening pulmonary hypertension.  Splinting is contributing.  No evidence of infection.  Patient appears to be responding to diuresis.     Assessment & Plan:   Principal Problem:   Acute on chronic respiratory failure with hypoxia (HCC) Active Problems:   COPD (chronic obstructive pulmonary disease) (HCC)   Generalized weakness   Obesity, Class III, BMI 40-49.9 (morbid obesity) (HCC)   Aneurysm of thoracic aorta   Hypothyroidism   Pulmonary hypertension (HCC)   Chronic kidney disease, stage 3a (HCC)   Multiple fractures of ribs, left side, initial encounter for closed fracture   Cor pulmonale (HCC)  Acute on chronic hypoxic respiratory failure Suspected pulmonary hypertension Multifactorial secondary to splinting from rib fractures Suspected contribution from underlying COPD/OSA Dilated RV on echo  indicating pulmonary hypertension Baseline requirement 4 L Cardiology on consult Pulmonology involved in care 12/1 Plan: Lasix 40 mg IV daily Aldactone 12.5 mg daily Reduce propranolol to 20 mg p.o. twice daily Midodrine 2.5 mg 3 times daily MetaNeb therapy Strict intake and output Wean oxygen as tolerated Goal saturation 88-92% Cardiology and pulmonology follow-up  COPD Does not appear acutely exacerbated Patient with decreased breath sounds but no wheezing or cough Schedule bronchodilators and MetaNeb  Generalized weakness Mechanical fall Therapy evaluations Needs out of bed therapy  Stable aneurysm of thoracic aorta 4.2 cm, stable appearance Outpatient surveillance  Hypothyroidism PTA Synthroid  Chronic kidney disease stage IIIa Cautious while on diuretics  Type 2 diabetes mellitus with renal manifestations Hold oral agents Moderate sliding scale  Obesity class III BMI 40-49.9 Patient BMI 40.8 This complicates overall care and prognosis  DVT prophylaxis: SQ Lovenox Code Status: Full Family Communication: Husband at bedside 11/30, 12/2 Disposition Plan: Status is: Inpatient  Remains inpatient appropriate because: Acute on chronic hypoxic respiratory failure in the setting of decompensated pulmonary hypertension       Level of care: Med-Surg  Consultants:  Cardiology-[facility]  Procedures:  None  Antimicrobials: None   Subjective: Patient seen and examined.  Enthusiastic about response to management.  Pain in ribs coordinating with known rib fractures Objective: Vitals:   11/22/21 0500 11/22/21 0536 11/22/21 0744 11/22/21 0837  BP:  (!) 118/94  102/72  Pulse:  90  80  Resp:  20  20  Temp:  97.7 F (36.5 C)  98 F (36.7 C)  TempSrc:      SpO2:   (!) 88% 90%  Weight: 107 kg     Height:  Intake/Output Summary (Last 24 hours) at 11/22/2021 1119 Last data filed at 11/21/2021 1800 Gross per 24 hour  Intake --  Output 1250 ml   Net -1250 ml   Filed Weights   11/19/21 1432 11/22/21 0500  Weight: 108.9 kg 107 kg    Examination:  General exam: No acute distress Respiratory system: Breath sounds improving.  Scattered crackles.  No wheeze.  Normal work of breathing.  6 L Cardiovascular system: S1-S2, RRR, no murmurs, 1+ pitting edema Gastrointestinal system: Obese, NT/ND, normal bowel sounds Central nervous system: Alert and oriented. No focal neurological deficits. Extremities: Symmetric 5 x 5 power. Skin: No rashes, lesions or ulcers Psychiatry: Judgement and insight appear normal. Mood & affect appropriate.     Data Reviewed: I have personally reviewed following labs and imaging studies  CBC: Recent Labs  Lab 11/19/21 1432 11/20/21 0103  WBC 11.9* 12.1*  HGB 16.1* 15.4*  HCT 48.0* 46.8*  MCV 93.8 95.5  PLT 172 536   Basic Metabolic Panel: Recent Labs  Lab 11/19/21 1432 11/20/21 0103 11/21/21 0512 11/22/21 0339  NA 138  --  138 138  K 3.5  --  3.2* 3.4*  CL 102  --  102 100  CO2 28  --  29 29  GLUCOSE 103*  --  100* 122*  BUN 22  --  20 23  CREATININE 1.15* 1.22* 1.10* 1.13*  CALCIUM 9.2  --  8.5* 8.4*  MG  --   --   --  2.5*   GFR: Estimated Creatinine Clearance: 52.9 mL/min (A) (by C-G formula based on SCr of 1.13 mg/dL (H)). Liver Function Tests: Recent Labs  Lab 11/19/21 1432  AST 32  ALT 19  ALKPHOS 75  BILITOT 1.6*  PROT 6.9  ALBUMIN 3.5   No results for input(s): LIPASE, AMYLASE in the last 168 hours. No results for input(s): AMMONIA in the last 168 hours. Coagulation Profile: No results for input(s): INR, PROTIME in the last 168 hours. Cardiac Enzymes: No results for input(s): CKTOTAL, CKMB, CKMBINDEX, TROPONINI in the last 168 hours. BNP (last 3 results) No results for input(s): PROBNP in the last 8760 hours. HbA1C: No results for input(s): HGBA1C in the last 72 hours. CBG: Recent Labs  Lab 11/21/21 1719 11/21/21 2108 11/22/21 0944  GLUCAP 116* 108* 118*    Lipid Profile: No results for input(s): CHOL, HDL, LDLCALC, TRIG, CHOLHDL, LDLDIRECT in the last 72 hours. Thyroid Function Tests: No results for input(s): TSH, T4TOTAL, FREET4, T3FREE, THYROIDAB in the last 72 hours. Anemia Panel: No results for input(s): VITAMINB12, FOLATE, FERRITIN, TIBC, IRON, RETICCTPCT in the last 72 hours. Sepsis Labs: Recent Labs  Lab 11/21/21 0512  PROCALCITON <0.10    Recent Results (from the past 240 hour(s))  Respiratory (~20 pathogens) panel by PCR     Status: None   Collection Time: 11/20/21  1:03 AM   Specimen: Nasopharyngeal Swab; Respiratory  Result Value Ref Range Status   Adenovirus NOT DETECTED NOT DETECTED Final   Coronavirus 229E NOT DETECTED NOT DETECTED Final    Comment: (NOTE) The Coronavirus on the Respiratory Panel, DOES NOT test for the novel  Coronavirus (2019 nCoV)    Coronavirus HKU1 NOT DETECTED NOT DETECTED Final   Coronavirus NL63 NOT DETECTED NOT DETECTED Final   Coronavirus OC43 NOT DETECTED NOT DETECTED Final   Metapneumovirus NOT DETECTED NOT DETECTED Final   Rhinovirus / Enterovirus NOT DETECTED NOT DETECTED Final   Influenza A NOT DETECTED NOT DETECTED Final  Influenza B NOT DETECTED NOT DETECTED Final   Parainfluenza Virus 1 NOT DETECTED NOT DETECTED Final   Parainfluenza Virus 2 NOT DETECTED NOT DETECTED Final   Parainfluenza Virus 3 NOT DETECTED NOT DETECTED Final   Parainfluenza Virus 4 NOT DETECTED NOT DETECTED Final   Respiratory Syncytial Virus NOT DETECTED NOT DETECTED Final   Bordetella pertussis NOT DETECTED NOT DETECTED Final   Bordetella Parapertussis NOT DETECTED NOT DETECTED Final   Chlamydophila pneumoniae NOT DETECTED NOT DETECTED Final   Mycoplasma pneumoniae NOT DETECTED NOT DETECTED Final    Comment: Performed at [facility], [place] [address] [place], [place] [address]  Resp Panel by RT-PCR (Flu A&B, Covid) Nasopharyngeal Swab     Status: None   Collection Time: 11/20/21  3:59 PM    Specimen: Nasopharyngeal Swab; Nasopharyngeal(NP) swabs in vial transport medium  Result Value Ref Range Status   SARS Coronavirus 2 by RT PCR NEGATIVE NEGATIVE Final    Comment: (NOTE) SARS-CoV-2 target nucleic acids are NOT DETECTED.  The SARS-CoV-2 RNA is generally detectable in upper respiratory specimens during the acute phase of infection. The lowest concentration of SARS-CoV-2 viral copies this assay can detect is 138 copies/mL. A negative result does not preclude SARS-Cov-2 infection and should not be used as the sole basis for treatment or other patient management decisions. A negative result may occur with  improper specimen collection/handling, submission of specimen other than nasopharyngeal swab, presence of viral mutation(s) within the areas targeted by this assay, and inadequate number of viral copies(<138 copies/mL). A negative result must be combined with clinical observations, patient history, and epidemiological information. The expected result is Negative.  Fact Sheet for Patients:  EntrepreneurPulse.com.au  Fact Sheet for Healthcare Providers:  IncredibleEmployment.be  This test is no t yet approved or cleared by the [place] FDA and  has been authorized for detection and/or diagnosis of SARS-CoV-2 by FDA under an Emergency Use Authorization (EUA). This EUA will remain  in effect (meaning this test can be used) for the duration of the COVID-19 declaration under Section 564(b)(1) of the Act, 21 U.S.C.section 360bbb-3(b)(1), unless the authorization is terminated  or revoked sooner.       Influenza A by PCR NEGATIVE NEGATIVE Final   Influenza B by PCR NEGATIVE NEGATIVE Final    Comment: (NOTE) The Xpert Xpress SARS-CoV-2/FLU/RSV plus assay is intended as an aid in the diagnosis of influenza from Nasopharyngeal swab specimens and should not be used as a sole basis for treatment. Nasal washings and aspirates are  unacceptable for Xpert Xpress SARS-CoV-2/FLU/RSV testing.  Fact Sheet for Patients: EntrepreneurPulse.com.au  Fact Sheet for Healthcare Providers: IncredibleEmployment.be  This test is not yet approved or cleared by the [place] FDA and has been authorized for detection and/or diagnosis of SARS-CoV-2 by FDA under an Emergency Use Authorization (EUA). This EUA will remain in effect (meaning this test can be used) for the duration of the COVID-19 declaration under Section 564(b)(1) of the Act, 21 U.S.C. section 360bbb-3(b)(1), unless the authorization is terminated or revoked.  Performed at [facility], [address], [place], [place] [address]          Radiology Studies: DG Chest [place] 1 View  Result Date: 11/21/2021 CLINICAL DATA:  Shortness of breath, hypoxia EXAM: PORTABLE CHEST 1 VIEW COMPARISON:  Chest radiograph 11/19/2021, CTA chest 11/19/2021 FINDINGS: The heart is enlarged, unchanged. There are patchy opacities in perihilar regions and right lung base. The left costophrenic angle is not well delineated,  likely due to cardiomegaly and adjacent fat as opposed to a pleural effusion. There is no right pleural effusion. There is no pneumothorax. Right shoulder arthroplasty hardware is again noted the left-sided rib fracture seen on the prior CTA chest are not well seen on the current study. IMPRESSION: 1. Patchy opacities in the perihilar regions and right lung base may reflect atelectasis; however, developing infection could have a similar appearance. 2. Cardiomegaly. Electronically Signed   By: Valetta Mole M.D.   On: 11/21/2021 10:41   ECHOCARDIOGRAM COMPLETE  Result Date: 11/20/2021    ECHOCARDIOGRAM REPORT   Patient Name:   Kari Medina Date of Exam: 11/20/2021 Medical Rec #:  [id number]             Height:       64.0 in Accession #:    [id number]            Weight:       240.0 lb Date of Birth:  Feb 01, 1946               BSA:          2.114 m Patient Age:    50 years              BP:           Not listed in chart/Not                                                     listed in chart mmHg Patient Gender: F                     HR:           Not listed in chart bpm. Exam Location:  [facility] Procedure: 2D Echo, Color Doppler and Cardiac Doppler Indications:     Pulmonary hypertension I27.2  History:         Patient has no prior history of Echocardiogram examinations.                  COPD; Risk Factors:Diabetes and Hypertension.  Sonographer:     Sherrie Sport Referring Phys:  [id number] Kathrin Ruddy ORGEL Diagnosing Phys: Donnelly Angelica  Sonographer Comments: Technically difficult study due to poor echo windows, no parasternal window and no apical window. Image acquisition challenging due to COPD and Obtainable view was a modified low PLAX/ Apical. IMPRESSIONS  1. Left ventricular ejection fraction, by estimation, is >75%. The left ventricle has hyperdynamic function. The left ventricle has no regional wall motion abnormalities. Left ventricular diastolic function could not be evaluated. There is the interventricular septum is flattened in diastole ('D' shaped left ventricle), consistent with right ventricular volume overload.  2. Unable to estimate pulmonary pressures due to technical difficulties. Right ventricular systolic function is moderately reduced. The right ventricular size is severely enlarged.  3. The mitral valve is degenerative. No evidence of mitral valve regurgitation. Moderate mitral annular calcification.  4. The aortic valve is calcified.  5. Aortic dilatation noted. There is mild dilatation of the ascending aorta, measuring 42 mm. Conclusion(s)/Recommendation(s): Findings consistent with pulmonary HTN, but evaluation limited by technical difficulties. FINDINGS  Left Ventricle: Left ventricular ejection fraction, by estimation, is >75%. The left ventricle has hyperdynamic function. The left ventricle has no regional wall motion  abnormalities. The left ventricular internal  cavity size was small. The interventricular septum is flattened in diastole ('D' shaped left ventricle), consistent with right ventricular volume overload. Left ventricular diastolic function could not be evaluated. Right Ventricle: Unable to estimate pulmonary pressures due to technical difficulties. The right ventricular size is severely enlarged. Right vetricular wall thickness was not assessed. Right ventricular systolic function is moderately reduced. Pericardium: There is no evidence of pericardial effusion. Mitral Valve: The mitral valve is degenerative in appearance. Moderate mitral annular calcification. No evidence of mitral valve regurgitation. Tricuspid Valve: The tricuspid valve is normal in structure. Aortic Valve: The aortic valve is calcified. Aorta: Aortic dilatation noted. There is mild dilatation of the ascending aorta, measuring 42 mm.  LEFT VENTRICLE PLAX 2D LVIDd:         2.90 cm LVIDs:         1.90 cm LV PW:         1.50 cm LV IVS:        1.00 cm  LEFT ATRIUM         Index LA diam:    3.80 cm 1.80 cm/m                        PULMONIC VALVE AORTA                 RVOT Peak grad: 2 mmHg Ao Root diam: 4.00 cm   SHUNTS Pulmonic VTI: 0.117 m Donnelly Angelica Electronically signed by Donnelly Angelica Signature Date/Time: 11/20/2021/5:42:36 PM    Final         Scheduled Meds:  aspirin EC  81 mg Oral Daily   DULoxetine  60 mg Oral Daily   enoxaparin (LOVENOX) injection  0.5 mg/kg Subcutaneous Q24H   furosemide  40 mg Intravenous Daily   insulin aspart  0-15 Units Subcutaneous TID WC   insulin aspart  0-5 Units Subcutaneous QHS   ipratropium-albuterol  3 mL Nebulization Q4H   levothyroxine  200 mcg Oral Q0600   mouth rinse  15 mL Mouth Rinse BID   midodrine  2.5 mg Oral TID WC   mometasone-formoterol  2 puff Inhalation BID   montelukast  10 mg Oral QHS   propranolol  20 mg Oral BID   rOPINIRole  3 mg Oral QHS   sodium chloride flush  3 mL  Intravenous Q12H   spironolactone  12.5 mg Oral Daily   Continuous Infusions:  sodium chloride       LOS: 3 days    Time spent: 25 minutes    Sidney Ace, MD [facility]   If 7PM-7AM, please contact night-coverage  11/22/2021, 11:19 AM

## 2021-11-22 NOTE — Progress Notes (Signed)
Physical Therapy Treatment Patient Details Name: Azaleah Usman MRN: [id number] DOB: Apr 20, 1946 Today's Date: 11/22/2021   History of Present Illness Myesha Stillion is a 75 y.o. female with medical history significant for COPD on home O2 at 2 L, class III obesity, CKD 3a, DM, thoracic aortic aneurysm, history of melanoma, pulmonary hypertension scheduled for right heart cath in December presents to the ED with a several month history of gradually worsening shortness of breath , lower extremity edema and generalized weakness  leading to a fall in the shower on the day of arrival, hitting her left chest.  She did not hit her head or lose consciousness. Had a fall a week prior without injury.    PT Comments    Pt seen for PT tx with pt agreeable & eager to get OOB to recliner. Pt completes supine>sit with mod I but requires min assist & 1UE HHA for successful sit>stand & stand pivot to recliner. After moving to chair pt declines further standing 2/2 fatigue but does perform BLE strengthening exercises. Pt on 6L/min via HFNC with lowest SpO2 reading of 80% with pt able to recover to 90% at end with cuing for pursed lip breathing. Max HR observed was 131 bpm. Will continue to follow pt acutely to progress gait with LRAD & activity tolerance.    Recommendations for follow up therapy are one component of a multi-disciplinary discharge planning process, led by the attending physician.  Recommendations may be updated based on patient status, additional functional criteria and insurance authorization.  Follow Up Recommendations  Home health PT     Assistance Recommended at Discharge Frequent or constant Supervision/Assistance  Equipment Recommendations  Rolling walker (2 wheels);BSC/3in1    Recommendations for Other Services       Precautions / Restrictions Precautions Precautions: Fall Restrictions Weight Bearing Restrictions: No     Mobility  Bed Mobility Overal bed mobility:  Modified Independent             General bed mobility comments: supine>sit with mod I with head of bed slightly elevated & use of bed rails, slightly extra time    Transfers Overall transfer level: Needs assistance Equipment used: 1 person hand held assist Transfers: Sit to/from Stand;Bed to chair/wheelchair/BSC Sit to Stand: Min assist (multiple attempts without UE support but successful with sit>stand with 1UE HHA) Stand pivot transfers: Min assist              Ambulation/Gait                   Stairs             Wheelchair Mobility    Modified Rankin (Stroke Patients Only)       Balance   Sitting-balance support: Feet supported;Bilateral upper extremity supported Sitting balance-Leahy Scale: Fair Sitting balance - Comments: supervision static sitting   Standing balance support: Single extremity supported;During functional activity Standing balance-Leahy Scale: Poor                              Cognition Arousal/Alertness: Awake/alert Behavior During Therapy: WFL for tasks assessed/performed Overall Cognitive Status: Within Functional Limits for tasks assessed                                          Exercises General Exercises - Lower Extremity Long Arc  Quad: AROM;Strengthening;Both;10 reps;Seated    General Comments        Pertinent Vitals/Pain Pain Assessment: 0-10 Faces Pain Scale: No hurt    Home Living                          Prior Function            PT Goals (current goals can now be found in the care plan section) Acute Rehab PT Goals Patient Stated Goal: to get better and go home PT Goal Formulation: With patient/family Time For Goal Achievement: 12/04/21 Potential to Achieve Goals: Good Progress towards PT goals: Progressing toward goals    Frequency    Min 2X/week      PT Plan Current plan remains appropriate    Co-evaluation              AM-PAC PT "6  Clicks" Mobility   Outcome Measure  Help needed turning from your back to your side while in a flat bed without using bedrails?: A Little Help needed moving from lying on your back to sitting on the side of a flat bed without using bedrails?: A Little Help needed moving to and from a bed to a chair (including a wheelchair)?: A Little Help needed standing up from a chair using your arms (e.g., wheelchair or bedside chair)?: A Little Help needed to walk in hospital room?: A Lot Help needed climbing 3-5 steps with a railing? : A Lot 6 Click Score: 16    End of Session Equipment Utilized During Treatment: Oxygen Activity Tolerance: Patient limited by fatigue Patient left: in chair;with call bell/phone within reach;with family/visitor present Nurse Communication: Mobility status (O2) PT Visit Diagnosis: Muscle weakness (generalized) (M62.81);Difficulty in walking, not elsewhere classified (R26.2);Unsteadiness on feet (R26.81)     Time: 8250-5397 PT Time Calculation (min) (ACUTE ONLY): 11 min  Charges:  $Therapeutic Activity: 8-22 mins                     Lavone Nian, PT, DPT 11/22/21, 4:17 PM    Waunita Schooner 11/22/2021, 4:16 PM

## 2021-11-23 LAB — GLUCOSE, CAPILLARY
Glucose-Capillary: 47 mg/dL — ABNORMAL LOW (ref 70–99)
Glucose-Capillary: 113 mg/dL — ABNORMAL HIGH (ref 70–99)
Glucose-Capillary: 145 mg/dL — ABNORMAL HIGH (ref 70–99)
Glucose-Capillary: 128 mg/dL — ABNORMAL HIGH (ref 70–99)
Glucose-Capillary: 94 mg/dL (ref 70–99)

## 2021-11-23 LAB — BASIC METABOLIC PANEL
Anion gap: 11 (ref 5–15)
BUN: 28 mg/dL — ABNORMAL HIGH (ref 8–23)
CO2: 28 mmol/L (ref 22–32)
Calcium: 8.7 mg/dL — ABNORMAL LOW (ref 8.9–10.3)
Chloride: 99 mmol/L (ref 98–111)
Creatinine, Ser: 0.94 mg/dL (ref 0.44–1.00)
GFR, Estimated: >60 mL/min (ref >60)
Glucose, Bld: 105 mg/dL — ABNORMAL HIGH (ref 70–99)
Potassium: 3.3 mmol/L — ABNORMAL LOW (ref 3.5–5.1)
Sodium: 138 mmol/L (ref 135–145)

## 2021-11-23 LAB — RHEUMATOID FACTOR: Rheumatoid fact SerPl-aCnc: 11.6 IU/mL (ref <14.0)

## 2021-11-23 LAB — MAGNESIUM: Magnesium: 2.6 mg/dL — ABNORMAL HIGH (ref 1.7–2.4)

## 2021-11-23 MED ORDER — POTASSIUM CHLORIDE CRYS ER 20 MEQ PO TBCR
40.0000 meq | EXTENDED_RELEASE_TABLET | Freq: Once | ORAL | Status: AC
  Administered 2021-11-23: 10:00:00 40 meq via ORAL
  Filled 2021-11-23: qty 2

## 2021-11-23 MED ORDER — SPIRONOLACTONE 25 MG PO TABS
25.0000 mg | ORAL_TABLET | Freq: Two times a day (BID) | ORAL | Status: DC
  Administered 2021-11-23 – 2021-11-26 (×6): 25 mg via ORAL
  Filled 2021-11-23 (×5): qty 1

## 2021-11-23 MED ORDER — FUROSEMIDE 10 MG/ML IJ SOLN
40.0000 mg | Freq: Two times a day (BID) | INTRAMUSCULAR | Status: DC
  Administered 2021-11-23 – 2021-11-26 (×7): 40 mg via INTRAVENOUS
  Filled 2021-11-23 (×7): qty 4

## 2021-11-23 NOTE — Progress Notes (Signed)
PROGRESS NOTE    Kaleisha Bhargava  HOZ:[id number] DOB: 1946-12-13 DOA: 11/19/2021 PCP: Venia Carbon, MD    Brief Narrative:   75 year old female history of obesity, hypertension, hyperlipidemia, obstructive sleep apnea, COPD on chronic 2 L admitted after a fall in the shower and resultant fractured ribs.  Cardiology consulted as the patient is currently undergoing evaluation pulmonary hypertension and does have clinical evidence of fluid overload.   She was recently seen in cardiology clinic on 11/28 in which time she described progressively worsening shortness of breath over the last several years.  She was initially scheduled for outpatient right heart catheterization for evaluation of pulmonary hypertension however 1 of in the hospital   She is clinically fluid overloaded on exam.  She will be diuresed aggressively.  Cardiology is consulted and may consider right heart catheterization however this may be also deferred to outpatient.  We will optimize volume status is much as possible.  Oxygen saturation and requirement has worsened to a maximum of 12 L.  Concern for worsening pulmonary hypertension.  Splinting is contributing.  No evidence of infection.  Patient appears to be responding to diuresis.  12/3: Patient seems to be slowly improving.  Diuresing effectively.  Remains dependent on 7 L nasal cannula   Assessment & Plan:   Principal Problem:   Acute on chronic respiratory failure with hypoxia (HCC) Active Problems:   COPD (chronic obstructive pulmonary disease) (HCC)   Generalized weakness   Obesity, Class III, BMI 40-49.9 (morbid obesity) ([place])   Aneurysm of thoracic aorta   Hypothyroidism   Pulmonary hypertension (HCC)   Chronic kidney disease, stage 3a (HCC)   Multiple fractures of ribs, left side, initial encounter for closed fracture   Cor pulmonale (HCC)  Acute on chronic hypoxic respiratory failure Suspected pulmonary hypertension Multifactorial  secondary to splinting from rib fractures Suspected contribution from underlying COPD/OSA Dilated RV on echo indicating pulmonary hypertension Baseline requirement 4 L Cardiology on consult Pulmonology involved in care 12/1 Net negative 570 cc on 12/2 Plan: Lasix 40 mg IV twice daily Aldactone 12.5 mg daily Propranolol reduced dose 20 mg p.o. daily Midodrine 2.5 mg 3 times daily MetaNeb therapy for alveolar recruitment Strict intake and output Wean oxygen as tolerated Goal saturation 88-92% Cardiology and pulmonology follow-up  COPD Does not appear acutely exacerbated Patient with decreased breath sounds but no wheezing or cough Schedule bronchodilators and MetaNeb Very weak and deconditioned.  Needs out of bed therapy Generalized weakness Mechanical fall Therapy evaluations Needs out of bed therapy  Stable aneurysm of thoracic aorta 4.2 cm, stable appearance Outpatient surveillance  Hypothyroidism PTA Synthroid  Chronic kidney disease stage IIIa Cautious while on diuretics Creatinine stable  Type 2 diabetes mellitus with renal manifestations Hold oral agents Moderate sliding scale  Obesity class III BMI 40-49.9 Patient BMI 70.4 This complicates overall care and prognosis  DVT prophylaxis: SQ Lovenox Code Status: Full Family Communication: Husband at bedside 11/30, 12/2 Disposition Plan: Status is: Inpatient  Remains inpatient appropriate because: Acute on chronic hypoxic respiratory failure in the setting of decompensated pulmonary hypertension       Level of care: Med-Surg  Consultants:  Cardiology-[facility]  Procedures:  None  Antimicrobials: None   Subjective: Patient seen and examined.  Reports symptomatic improvement.  Objective: Vitals:   11/23/21 0110 11/23/21 0602 11/23/21 0738 11/23/21 0905  BP:  106/69  120/73  Pulse:  79  75  Resp:  18  20  Temp:  (!) 97.2 F (36.2  C)  97.8 F (36.6 C)  TempSrc:  Oral    SpO2: 92%  94% 91% 91%  Weight:      Height:        Intake/Output Summary (Last 24 hours) at 11/23/2021 1104 Last data filed at 11/23/2021 1000 Gross per 24 hour  Intake 476 ml  Output 250 ml  Net 226 ml   Filed Weights   11/19/21 1432 11/22/21 0500  Weight: 108.9 kg 107 kg    Examination:  General exam: No acute distress.  Appears somewhat frail. Respiratory system: Improved air entry bilaterally.  Scattered crackles.  No wheeze.  Normal work of breathing.  7 L Cardiovascular system: S1-S2, RRR, no murmurs, 1+ pitting edema Gastrointestinal system: Obese, NT/ND, normal bowel sounds Central nervous system: Alert and oriented. No focal neurological deficits. Extremities: Symmetric 5 x 5 power. Skin: No rashes, lesions or ulcers Psychiatry: Judgement and insight appear normal. Mood & affect appropriate.     Data Reviewed: I have personally reviewed following labs and imaging studies  CBC: Recent Labs  Lab 11/19/21 1432 11/20/21 0103  WBC 11.9* 12.1*  HGB 16.1* 15.4*  HCT 48.0* 46.8*  MCV 93.8 95.5  PLT 172 841   Basic Metabolic Panel: Recent Labs  Lab 11/19/21 1432 11/20/21 0103 11/21/21 0512 11/22/21 0339 11/23/21 0541  NA 138  --  138 138 138  K 3.5  --  3.2* 3.4* 3.3*  CL 102  --  102 100 99  CO2 28  --  29 29 28   GLUCOSE 103*  --  100* 122* 105*  BUN 22  --  20 23 28*  CREATININE 1.15* 1.22* 1.10* 1.13* 0.94  CALCIUM 9.2  --  8.5* 8.4* 8.7*  MG  --   --   --  2.5* 2.6*   GFR: Estimated Creatinine Clearance: 63.6 mL/min (by C-G formula based on SCr of 0.94 mg/dL). Liver Function Tests: Recent Labs  Lab 11/19/21 1432  AST 32  ALT 19  ALKPHOS 75  BILITOT 1.6*  PROT 6.9  ALBUMIN 3.5   No results for input(s): LIPASE, AMYLASE in the last 168 hours. No results for input(s): AMMONIA in the last 168 hours. Coagulation Profile: No results for input(s): INR, PROTIME in the last 168 hours. Cardiac Enzymes: No results for input(s): CKTOTAL, CKMB, CKMBINDEX,  TROPONINI in the last 168 hours. BNP (last 3 results) No results for input(s): PROBNP in the last 8760 hours. HbA1C: No results for input(s): HGBA1C in the last 72 hours. CBG: Recent Labs  Lab 11/22/21 1312 11/22/21 1623 11/22/21 2110 11/23/21 0904 11/23/21 1023  GLUCAP 128* 91 83 47* 113*   Lipid Profile: No results for input(s): CHOL, HDL, LDLCALC, TRIG, CHOLHDL, LDLDIRECT in the last 72 hours. Thyroid Function Tests: No results for input(s): TSH, T4TOTAL, FREET4, T3FREE, THYROIDAB in the last 72 hours. Anemia Panel: No results for input(s): VITAMINB12, FOLATE, FERRITIN, TIBC, IRON, RETICCTPCT in the last 72 hours. Sepsis Labs: Recent Labs  Lab 11/21/21 0512  PROCALCITON <0.10    Recent Results (from the past 240 hour(s))  Respiratory (~20 pathogens) panel by PCR     Status: None   Collection Time: 11/20/21  1:03 AM   Specimen: Nasopharyngeal Swab; Respiratory  Result Value Ref Range Status   Adenovirus NOT DETECTED NOT DETECTED Final   Coronavirus 229E NOT DETECTED NOT DETECTED Final    Comment: (NOTE) The Coronavirus on the Respiratory Panel, DOES NOT test for the novel  Coronavirus (2019 nCoV)  Coronavirus HKU1 NOT DETECTED NOT DETECTED Final   Coronavirus NL63 NOT DETECTED NOT DETECTED Final   Coronavirus OC43 NOT DETECTED NOT DETECTED Final   Metapneumovirus NOT DETECTED NOT DETECTED Final   Rhinovirus / Enterovirus NOT DETECTED NOT DETECTED Final   Influenza A NOT DETECTED NOT DETECTED Final   Influenza B NOT DETECTED NOT DETECTED Final   Parainfluenza Virus 1 NOT DETECTED NOT DETECTED Final   Parainfluenza Virus 2 NOT DETECTED NOT DETECTED Final   Parainfluenza Virus 3 NOT DETECTED NOT DETECTED Final   Parainfluenza Virus 4 NOT DETECTED NOT DETECTED Final   Respiratory Syncytial Virus NOT DETECTED NOT DETECTED Final   Bordetella pertussis NOT DETECTED NOT DETECTED Final   Bordetella Parapertussis NOT DETECTED NOT DETECTED Final   Chlamydophila  pneumoniae NOT DETECTED NOT DETECTED Final   Mycoplasma pneumoniae NOT DETECTED NOT DETECTED Final    Comment: Performed at [facility], [place] [address] [place], [place] [address]  Resp Panel by RT-PCR (Flu A&B, Covid) Nasopharyngeal Swab     Status: None   Collection Time: 11/20/21  3:59 PM   Specimen: Nasopharyngeal Swab; Nasopharyngeal(NP) swabs in vial transport medium  Result Value Ref Range Status   SARS Coronavirus 2 by RT PCR NEGATIVE NEGATIVE Final    Comment: (NOTE) SARS-CoV-2 target nucleic acids are NOT DETECTED.  The SARS-CoV-2 RNA is generally detectable in upper respiratory specimens during the acute phase of infection. The lowest concentration of SARS-CoV-2 viral copies this assay can detect is 138 copies/mL. A negative result does not preclude SARS-Cov-2 infection and should not be used as the sole basis for treatment or other patient management decisions. A negative result may occur with  improper specimen collection/handling, submission of specimen other than nasopharyngeal swab, presence of viral mutation(s) within the areas targeted by this assay, and inadequate number of viral copies(<138 copies/mL). A negative result must be combined with clinical observations, patient history, and epidemiological information. The expected result is Negative.  Fact Sheet for Patients:  EntrepreneurPulse.com.au  Fact Sheet for Healthcare Providers:  IncredibleEmployment.be  This test is no t yet approved or cleared by the [place] FDA and  has been authorized for detection and/or diagnosis of SARS-CoV-2 by FDA under an Emergency Use Authorization (EUA). This EUA will remain  in effect (meaning this test can be used) for the duration of the COVID-19 declaration under Section 564(b)(1) of the Act, 21 U.S.C.section 360bbb-3(b)(1), unless the authorization is terminated  or revoked sooner.       Influenza A by PCR NEGATIVE  NEGATIVE Final   Influenza B by PCR NEGATIVE NEGATIVE Final    Comment: (NOTE) The Xpert Xpress SARS-CoV-2/FLU/RSV plus assay is intended as an aid in the diagnosis of influenza from Nasopharyngeal swab specimens and should not be used as a sole basis for treatment. Nasal washings and aspirates are unacceptable for Xpert Xpress SARS-CoV-2/FLU/RSV testing.  Fact Sheet for Patients: EntrepreneurPulse.com.au  Fact Sheet for Healthcare Providers: IncredibleEmployment.be  This test is not yet approved or cleared by the [place] FDA and has been authorized for detection and/or diagnosis of SARS-CoV-2 by FDA under an Emergency Use Authorization (EUA). This EUA will remain in effect (meaning this test can be used) for the duration of the COVID-19 declaration under Section 564(b)(1) of the Act, 21 U.S.C. section 360bbb-3(b)(1), unless the authorization is terminated or revoked.  Performed at [facility], [address], [place],  [address]          Radiology Studies:  No results found.      Scheduled Meds:  aspirin EC  81 mg Oral Daily   DULoxetine  60 mg Oral Daily   enoxaparin (LOVENOX) injection  0.5 mg/kg Subcutaneous Q24H   furosemide  40 mg Intravenous Q12H   insulin aspart  0-15 Units Subcutaneous TID WC   insulin aspart  0-5 Units Subcutaneous QHS   ipratropium-albuterol  3 mL Nebulization Q4H   levothyroxine  200 mcg Oral Q0600   mouth rinse  15 mL Mouth Rinse BID   midodrine  2.5 mg Oral TID WC   mometasone-formoterol  2 puff Inhalation BID   montelukast  10 mg Oral QHS   propranolol  20 mg Oral BID   rOPINIRole  3 mg Oral QHS   sodium chloride flush  3 mL Intravenous Q12H   spironolactone  12.5 mg Oral Daily   Continuous Infusions:  sodium chloride       LOS: 4 days    Time spent: 25 minutes    Sidney Ace, MD [facility]   If 7PM-7AM, please contact  night-coverage  11/23/2021, 11:04 AM

## 2021-11-23 NOTE — Progress Notes (Signed)
[facility] Cardiology    SUBJECTIVE: Patient states she feels reasonably well still has some shortness of breath still has some left rib flank discomfort from a mechanical fall no chest pain   Vitals:   11/23/21 0031 11/23/21 0110 11/23/21 0602 11/23/21 0738  BP: 137/65  106/69   Pulse: 79  79   Resp: 19  18   Temp: 98 F (36.7 C)  (!) 97.2 F (36.2 C)   TempSrc:   Oral   SpO2: 90% 92% 94% 91%  Weight:      Height:         Intake/Output Summary (Last 24 hours) at 11/23/2021 0801 Last data filed at 11/23/2021 7494 Gross per 24 hour  Intake --  Output 570 ml  Net -570 ml      PHYSICAL EXAM  General: Well developed, well nourished, in no acute distress HEENT:  Normocephalic and atramatic Neck:  No JVD.  Lungs: Clear bilaterally to auscultation and percussion. Heart: HRRR . Normal S1 and S2 without gallops or murmurs.  Abdomen: Bowel sounds are positive, abdomen soft and non-tender  Msk:  Back normal, normal gait. Normal strength and tone for age. Extremities: No clubbing, cyanosis or edema.   Neuro: Alert and oriented X 3. Psych:  Good affect, responds appropriately   LABS: Basic Metabolic Panel: Recent Labs    11/22/21 0339 11/23/21 0541  NA 138 138  K 3.4* 3.3*  CL 100 99  CO2 29 28  GLUCOSE 122* 105*  BUN 23 28*  CREATININE 1.13* 0.94  CALCIUM 8.4* 8.7*  MG 2.5* 2.6*   Liver Function Tests: No results for input(s): AST, ALT, ALKPHOS, BILITOT, PROT, ALBUMIN in the last 72 hours. No results for input(s): LIPASE, AMYLASE in the last 72 hours. CBC: No results for input(s): WBC, NEUTROABS, HGB, HCT, MCV, PLT in the last 72 hours. Cardiac Enzymes: No results for input(s): CKTOTAL, CKMB, CKMBINDEX, TROPONINI in the last 72 hours. BNP: Invalid input(s): POCBNP D-Dimer: No results for input(s): DDIMER in the last 72 hours. Hemoglobin A1C: No results for input(s): HGBA1C in the last 72 hours. Fasting Lipid Panel: No results for input(s): CHOL, HDL, LDLCALC,  TRIG, CHOLHDL, LDLDIRECT in the last 72 hours. Thyroid Function Tests: No results for input(s): TSH, T4TOTAL, T3FREE, THYROIDAB in the last 72 hours.  Invalid input(s): FREET3 Anemia Panel: No results for input(s): VITAMINB12, FOLATE, FERRITIN, TIBC, IRON, RETICCTPCT in the last 72 hours.  DG Chest Port 1 View  Result Date: 11/21/2021 CLINICAL DATA:  Shortness of breath, hypoxia EXAM: PORTABLE CHEST 1 VIEW COMPARISON:  Chest radiograph 11/19/2021, CTA chest 11/19/2021 FINDINGS: The heart is enlarged, unchanged. There are patchy opacities in perihilar regions and right lung base. The left costophrenic angle is not well delineated, likely due to cardiomegaly and adjacent fat as opposed to a pleural effusion. There is no right pleural effusion. There is no pneumothorax. Right shoulder arthroplasty hardware is again noted the left-sided rib fracture seen on the prior CTA chest are not well seen on the current study. IMPRESSION: 1. Patchy opacities in the perihilar regions and right lung base may reflect atelectasis; however, developing infection could have a similar appearance. 2. Cardiomegaly. Electronically Signed   By: Valetta Mole M.D.   On: 11/21/2021 10:41     Echo preserved left ventricular function evidence of pulmonary hypertension  TELEMETRY:  ASSESSMENT AND PLAN:  Principal Problem:   Acute on chronic respiratory failure with hypoxia (HCC) Active Problems:   COPD (chronic obstructive pulmonary disease) (  [place])   Generalized weakness   Obesity, Class III, BMI 40-49.9 (morbid obesity) (HCC)   Aneurysm of thoracic aorta   Hypothyroidism   Pulmonary hypertension (HCC)   Chronic kidney disease, stage 3a (HCC)   Multiple fractures of ribs, left side, initial encounter for closed fracture   Cor pulmonale (HCC)    Plan Status post mechanical fall we will consider physical therapy pain management Pulmonary hypertension may need right and left heart cath at some point right now not a  good Candidate Chronic renal insufficiency of the patient follow-up with nephrology as necessary Agree with pulmonary input for evaluation and treatment of pulm hypertension Inhalers as necessary for COPD type symptoms Recommend weight loss exercise portion control if possible For diuretic therapy to help with symptom control   Yolonda Kida, MD 11/23/2021 8:01 AM

## 2021-11-23 NOTE — TOC Progression Note (Signed)
Transition of Care [facility]) - Progression Note    Patient Details  Name: Meadow Abramo MRN: [id number] Date of Birth: May 08, 1946  Transition [facility]) CM/SW Contact  Zigmund Daniel Dorian Pod, RN Phone [phone] 11/23/2021, 5:12 PM  Clinical Narrative:    RN spoke with pt and spouse concerning the recommended HHPT w/ DME. Pt verified she has a rolling walker with set and bedside commode (3-1).  Pt opt to receive services from [facility]). Called with this referral that was accepted for services.   Physical address obtained: [place], [place], [place] [address] for home visit via [place].  TOC will continue to follow up according with any ongoing discharge needs.   Expected Discharge Plan: [place] Barriers to Discharge: Barriers Resolved  Expected Discharge Plan and Services Expected Discharge Plan: [facility] In-house Referral: Clinical Social Work   Post Acute Care Choice: IP Rehab Living arrangements for the past 2 months: [place]: PT [place]: [place] ([place]) Date Welcome: 11/23/21 Time [place]: [place] Representative spoke with at [place]: [place] (SDOH) Interventions    Readmission Risk Interventions No flowsheet data found.

## 2021-11-23 NOTE — Progress Notes (Signed)
Pulmonary Medicine          Date: 11/23/2021,   MRN# [id number] Kari Medina Jul 19, 1946     AdmissionWeight: 108.9 kg                 CurrentWeight: 107 kg   Referring physician: Dr Corky Sox   CHIEF COMPLAINT:   Increased O2 requirement with acute on chronic hypoxemic respiratory failure   HISTORY OF PRESENT ILLNESS   This is a pleasant 75 yo F with hx of COPD and OSA overlap syndrome chronically hypoxemic with CPAP and O2 bleed in at home, with extensive comborbid history including MDD, dyslipidemia, hiatal hernia, RLS, Asplenic status, raynauds, fibromyalgia, PONV, RLS came in to hospital post mechanical fall with trauma to torso and resultant rib fractures.  She is being evaluated with cardiology for CHF and PH and is noted to have severe hypoxemia requiring upwards of 12L/min supplemental O2.  She had CTPE done which I reviewed, there is very generous heart size and bibasilar atelectasis but entire lung appears with interstitial edemaPCCM consulted for further evaluation and management.    11/22/21- patient states she feels better. She would like to get OOB to chair. She is using IS.  She continues to urinate.  PT/OT is ordered.   11/23/21- patient is OOB to chair with family in room.  She is on 5-7L/min slowly improving.  Renal function is back to normal and ive increased diuretics today.  She relates feeling stronger and wants to work to get out of hospital. Her UOP is not measured correctly she goes to toilet and flushes. She continues to work with RT to use metaNEB TID. She uses CPAP QHS.   PAST MEDICAL HISTORY   Past Medical History:  Diagnosis Date   Asthma    Chronic hypoxemic respiratory failure (HCC)    uses O2 with exertion and with CPAP   COPD (chronic obstructive pulmonary disease) (HCC)    Depression    Diabetes mellitus without complication (HCC)    Dyspnea    doe   Dysrhythmia    extra beat   Fatty liver    Fibromyalgia    Generalized  osteoarthritis of multiple sites    History of hiatal hernia    Hypertension    Hypothyroidism    Melanoma ([place]) 07/2018   Right leg   OSA (obstructive sleep apnea)    Pain    chronic ruq and back pain   Panic attacks    PONV (postoperative nausea and vomiting)    after thyroidectomy   Raynaud disease    RLS (restless legs syndrome)    Spleen absent    TOLD ABSENT THEN TOLD DOES HAVE SPLEEN. PATIENT IS UNCERTAIN   Tremor, essential    Wears dentures    full upper and lower     SURGICAL HISTORY   Past Surgical History:  Procedure Laterality Date   CATARACT EXTRACTION W/PHACO Right 03/04/2021   Procedure: CATARACT EXTRACTION PHACO AND INTRAOCULAR LENS PLACEMENT ([facility]) RIGHT DIABETIC 3.98 00:33.2;  Surgeon: Eulogio Bear, MD;  Location: [place];  Service: Ophthalmology;  Laterality: Right;  Diabetic - oral meds   CATARACT EXTRACTION W/PHACO Left 03/18/2021   Procedure: CATARACT EXTRACTION PHACO AND INTRAOCULAR LENS PLACEMENT ([place]) LEFT;  Surgeon: Eulogio Bear, MD;  Location: [place];  Service: Ophthalmology;  Laterality: Left;  2.96 0:27.9   CHOLECYSTECTOMY     DILATATION & CURETTAGE/HYSTEROSCOPY WITH MYOSURE N/A 11/10/2018   Procedure: DILATATION &  CURETTAGE/HYSTEROSCOPY WITH MYOSURE/MYOMECTOMY;  Surgeon: Aletha Halim, MD;  Location: [place];  Service: Gynecology;  Laterality: N/A;  possible myosure.  Please use myosure scope, do not open myosure blades but have in the room   JOINT REPLACEMENT Bilateral    2008/2011   MELANOMA EXCISION  07/2018   REVERSE SHOULDER ARTHROPLASTY Right 05/28/2017   Procedure: REVERSE SHOULDER ARTHROPLASTY;  Surgeon: Corky Mull, MD;  Location: [facility];  Service: Orthopedics;  Laterality: Right;   RHINOPLASTY  1972   THYROIDECTOMY  2006   TOTAL KNEE ARTHROPLASTY     bilateral     FAMILY HISTORY   Family History  Problem Relation Age of Onset   Osteoarthritis Mother    Diabetes Mother     Cirrhosis Mother    Cancer Father    Kidney cancer Father    Bladder Cancer Father    Heart disease Brother        stents in 1 brother   Breast cancer Neg Hx      SOCIAL HISTORY   Social History   Tobacco Use   Smoking status: Former    Packs/day: 0.25    Years: 5.00    Pack years: 1.25    Types: Cigarettes    Quit date: 12/22/1988    Years since quitting: 32.9   Smokeless tobacco: Never  Vaping Use   Vaping Use: Never used  Substance Use Topics   Alcohol use: No   Drug use: No     MEDICATIONS    Home Medication:    Current Medication:  Current Facility-Administered Medications:    0.9 %  sodium chloride infusion, 250 mL, Intravenous, PRN, Athena Masse, MD   acetaminophen (TYLENOL) tablet 650 mg, 650 mg, Oral, Q4H PRN, Athena Masse, MD   albuterol (PROVENTIL) (2.5 MG/3ML) 0.083% nebulizer solution 2.5 mg, 2.5 mg, Nebulization, Q6H PRN, Renda Rolls, RPH   ALPRAZolam Duanne Moron) tablet 0.25 mg, 0.25 mg, Oral, BID PRN, Judd Gaudier V, MD, 0.25 mg at 11/22/21 0539   aspirin EC tablet 81 mg, 81 mg, Oral, Daily, Sreenath, Sudheer B, MD, 81 mg at 11/23/21 1029   DULoxetine (CYMBALTA) DR capsule 60 mg, 60 mg, Oral, Daily, Sreenath, Sudheer B, MD, 60 mg at 11/23/21 1028   enoxaparin (LOVENOX) injection 55 mg, 0.5 mg/kg, Subcutaneous, Q24H, Judd Gaudier V, MD, 55 mg at 11/23/21 1032   fluticasone (FLONASE) 50 MCG/ACT nasal spray 1 spray, 1 spray, Each Nare, Daily PRN, Sreenath, Sudheer B, MD   furosemide (LASIX) injection 40 mg, 40 mg, Intravenous, Q12H, Sreenath, Sudheer B, MD, 40 mg at 11/23/21 1029   HYDROcodone-acetaminophen (NORCO/VICODIN) 5-325 MG per tablet 1 tablet, 1 tablet, Oral, Q4H PRN, Athena Masse, MD, 1 tablet at 11/23/21 1027   insulin aspart (novoLOG) injection 0-15 Units, 0-15 Units, Subcutaneous, TID WC, Sreenath, Sudheer B, MD, 3 Units at 11/23/21 1735   insulin aspart (novoLOG) injection 0-5 Units, 0-5 Units, Subcutaneous, QHS, Sreenath,  Sudheer B, MD   ipratropium-albuterol (DUONEB) 0.5-2.5 (3) MG/3ML nebulizer solution 3 mL, 3 mL, Nebulization, Q4H, Sreenath, Sudheer B, MD, 3 mL at 11/23/21 1524   levothyroxine (SYNTHROID) tablet 200 mcg, 200 mcg, Oral, Q0600, Priscella Mann, Sudheer B, MD, 200 mcg at 11/23/21 0552   MEDLINE mouth rinse, 15 mL, Mouth Rinse, BID, Judd Gaudier V, MD, 15 mL at 11/23/21 1032   midodrine (PROAMATINE) tablet 2.5 mg, 2.5 mg, Oral, TID WC, Oluwademilade Kellett, MD, 2.5 mg at 11/23/21 1736   mometasone-formoterol (DULERA) 200-5  MCG/ACT inhaler 2 puff, 2 puff, Inhalation, BID, Priscella Mann, Sudheer B, MD, 2 puff at 11/23/21 1033   montelukast (SINGULAIR) tablet 10 mg, 10 mg, Oral, QHS, Sreenath, Sudheer B, MD, 10 mg at 11/22/21 2107   morphine 2 MG/ML injection 2 mg, 2 mg, Intravenous, Q2H PRN, Athena Masse, MD, 2 mg at 11/23/21 1738   ondansetron (ZOFRAN) injection 4 mg, 4 mg, Intravenous, Q6H PRN, Athena Masse, MD   propranolol (INDERAL) tablet 20 mg, 20 mg, Oral, BID, Ottie Glazier, MD, 20 mg at 11/23/21 1027   rOPINIRole (REQUIP) tablet 3 mg, 3 mg, Oral, QHS, Sreenath, Sudheer B, MD, 3 mg at 11/22/21 2107   sodium chloride flush (NS) 0.9 % injection 3 mL, 3 mL, Intravenous, Q12H, Judd Gaudier V, MD, 3 mL at 11/23/21 1033   sodium chloride flush (NS) 0.9 % injection 3 mL, 3 mL, Intravenous, PRN, Athena Masse, MD   spironolactone (ALDACTONE) tablet 25 mg, 25 mg, Oral, BID, Nyisha Clippard, MD    ALLERGIES   Latex, Nickel, Pramipexole, Prednisone, Topiramate, Citalopram, Paroxetine hcl, and Sertraline     REVIEW OF SYSTEMS    Review of Systems:  Gen:  Denies  fever, sweats, chills weigh loss  HEENT: Denies blurred vision, double vision, ear pain, eye pain, hearing loss, nose bleeds, sore throat Cardiac:  No dizziness, chest pain or heaviness, chest tightness,edema Resp:   Denies cough or sputum porduction, shortness of breath,wheezing, hemoptysis,  Gi: Denies swallowing difficulty, stomach  pain, nausea or vomiting, diarrhea, constipation, bowel incontinence Gu:  Denies bladder incontinence, burning urine Ext:   Denies Joint pain, stiffness or swelling Skin: Denies  skin rash, easy bruising or bleeding or hives Endoc:  Denies polyuria, polydipsia , polyphagia or weight change Psych:   Denies depression, insomnia or hallucinations   Other:  All other systems negative   VS: BP 133/77 (BP Location: Right Arm)   Pulse 76   Temp 97.7 F (36.5 C) (Oral)   Resp 19   Ht 5\' 4"  (1.626 m)   Wt 107 kg   SpO2 96%   BMI 40.51 kg/m      PHYSICAL EXAM    GENERAL:NAD, no fevers, chills, no weakness no fatigue HEAD: Normocephalic, atraumatic.  EYES: Pupils equal, round, reactive to light. Extraocular muscles intact. No scleral icterus.  MOUTH: Moist mucosal membrane. Dentition intact. No abscess noted.  EAR, NOSE, THROAT: Clear without exudates. No external lesions.  NECK: Supple. No thyromegaly. No nodules. No JVD.  PULMONARY: mild bibasilar rhonchi CARDIOVASCULAR: S1 and S2. Regular rate and rhythm. No murmurs, rubs, or gallops. No edema. Pedal pulses 2+ bilaterally.  GASTROINTESTINAL: Soft, nontender, nondistended. No masses. Positive bowel sounds. No hepatosplenomegaly.  MUSCULOSKELETAL: No swelling, clubbing, or edema. Range of motion full in all extremities.  NEUROLOGIC: Cranial nerves II through XII are intact. No gross focal neurological deficits. Sensation intact. Reflexes intact.  SKIN: No ulceration, lesions, rashes, or cyanosis. Skin warm and dry. Turgor intact.  PSYCHIATRIC: Mood, affect within normal limits. The patient is awake, alert and oriented x 3. Insight, judgment intact.       IMAGING    DG Chest 2 View  Result Date: 11/19/2021 CLINICAL DATA:  Shortness of breath EXAM: CHEST - 2 VIEW COMPARISON:  Radiograph 10/23/2018, chest CT 09/26/2021 FINDINGS: Unchanged cardiomediastinal silhouette. Prominent pulmonary arteries. Bibasilar airspace opacities.  Reverse right shoulder arthroplasty. No acute osseous abnormality. Thoracic spondylosis. IMPRESSION: Bibasilar airspace opacities which could be atelectasis or developing infection. Electronically Signed  By: Maurine Simmering M.D.   On: 11/19/2021 15:13   CT Angio Chest PE W and/or Wo Contrast  Result Date: 11/19/2021 CLINICAL DATA:  Status post fall. EXAM: CT ANGIOGRAPHY CHEST WITH CONTRAST TECHNIQUE: Multidetector CT imaging of the chest was performed using the standard protocol during bolus administration of intravenous contrast. Multiplanar CT image reconstructions and MIPs were obtained to evaluate the vascular anatomy. CONTRAST:  160mL OMNIPAQUE IOHEXOL 350 MG/ML SOLN COMPARISON:  September 26, 2021 FINDINGS: Cardiovascular: Stable 4.2 cm diameter aneurysmal dilatation of the ascending thoracic aorta is seen. Stable pulmonary artery enlargement is seen. Satisfactory opacification of the pulmonary arteries to the segmental level. No evidence of pulmonary embolism. There is mild cardiomegaly. No pericardial effusion. Mediastinum/Nodes: Mild AP window 1 mild pretracheal lymphadenopathy is seen. Thyroid gland, trachea, and esophagus demonstrate no significant findings. Lungs/Pleura: Mild linear atelectasis is seen within the posterolateral aspect of the right apex, anteromedial aspect of the left upper lobe and posterior aspects of the bilateral lung bases. There is no evidence of acute infiltrate, pleural effusion or pneumothorax. Upper Abdomen: No acute abnormality. Musculoskeletal: Acute, anterolateral seventh and eighth left rib fractures are seen. Acute, displaced posterior ninth and tenth left rib fractures are also noted. A right shoulder replacement is noted. Multilevel degenerative changes are seen throughout the thoracic spine. Review of the MIP images confirms the above findings. IMPRESSION: 1. Acute, seventh, eighth, ninth and tenth left rib fractures, as described above. 2. Stable 4.2 cm diameter  aneurysmal dilatation of the ascending thoracic aorta. 3. Stable pulmonary artery enlargement, which may represent pulmonary arterial hypertension. Electronically Signed   By: Virgina Norfolk M.D.   On: 11/19/2021 23:02   DG Chest Port 1 View  Result Date: 11/21/2021 CLINICAL DATA:  Shortness of breath, hypoxia EXAM: PORTABLE CHEST 1 VIEW COMPARISON:  Chest radiograph 11/19/2021, CTA chest 11/19/2021 FINDINGS: The heart is enlarged, unchanged. There are patchy opacities in perihilar regions and right lung base. The left costophrenic angle is not well delineated, likely due to cardiomegaly and adjacent fat as opposed to a pleural effusion. There is no right pleural effusion. There is no pneumothorax. Right shoulder arthroplasty hardware is again noted the left-sided rib fracture seen on the prior CTA chest are not well seen on the current study. IMPRESSION: 1. Patchy opacities in the perihilar regions and right lung base may reflect atelectasis; however, developing infection could have a similar appearance. 2. Cardiomegaly. Electronically Signed   By: Valetta Mole M.D.   On: 11/21/2021 10:41   ECHOCARDIOGRAM COMPLETE  Result Date: 11/20/2021    ECHOCARDIOGRAM REPORT   Patient Name:   MINSA WEDDINGTON Menefee Date of Exam: 11/20/2021 Medical Rec #:  [id number]             Height:       64.0 in Accession #:    [id number]            Weight:       240.0 lb Date of Birth:  08-11-1946              BSA:          2.114 m Patient Age:    42 years              BP:           Not listed in chart/Not  listed in chart mmHg Patient Gender: F                     HR:           Not listed in chart bpm. Exam Location:  [facility] Procedure: 2D Echo, Color Doppler and Cardiac Doppler Indications:     Pulmonary hypertension I27.2  History:         Patient has no prior history of Echocardiogram examinations.                  COPD; Risk Factors:Diabetes and Hypertension.  Sonographer:      Sherrie Sport Referring Phys:  [id number] Kathrin Ruddy ORGEL Diagnosing Phys: Donnelly Angelica  Sonographer Comments: Technically difficult study due to poor echo windows, no parasternal window and no apical window. Image acquisition challenging due to COPD and Obtainable view was a modified low PLAX/ Apical. IMPRESSIONS  1. Left ventricular ejection fraction, by estimation, is >75%. The left ventricle has hyperdynamic function. The left ventricle has no regional wall motion abnormalities. Left ventricular diastolic function could not be evaluated. There is the interventricular septum is flattened in diastole ('D' shaped left ventricle), consistent with right ventricular volume overload.  2. Unable to estimate pulmonary pressures due to technical difficulties. Right ventricular systolic function is moderately reduced. The right ventricular size is severely enlarged.  3. The mitral valve is degenerative. No evidence of mitral valve regurgitation. Moderate mitral annular calcification.  4. The aortic valve is calcified.  5. Aortic dilatation noted. There is mild dilatation of the ascending aorta, measuring 42 mm. Conclusion(s)/Recommendation(s): Findings consistent with pulmonary HTN, but evaluation limited by technical difficulties. FINDINGS  Left Ventricle: Left ventricular ejection fraction, by estimation, is >75%. The left ventricle has hyperdynamic function. The left ventricle has no regional wall motion abnormalities. The left ventricular internal cavity size was small. The interventricular septum is flattened in diastole ('D' shaped left ventricle), consistent with right ventricular volume overload. Left ventricular diastolic function could not be evaluated. Right Ventricle: Unable to estimate pulmonary pressures due to technical difficulties. The right ventricular size is severely enlarged. Right vetricular wall thickness was not assessed. Right ventricular systolic function is moderately reduced. Pericardium: There is no  evidence of pericardial effusion. Mitral Valve: The mitral valve is degenerative in appearance. Moderate mitral annular calcification. No evidence of mitral valve regurgitation. Tricuspid Valve: The tricuspid valve is normal in structure. Aortic Valve: The aortic valve is calcified. Aorta: Aortic dilatation noted. There is mild dilatation of the ascending aorta, measuring 42 mm.  LEFT VENTRICLE PLAX 2D LVIDd:         2.90 cm LVIDs:         1.90 cm LV PW:         1.50 cm LV IVS:        1.00 cm  LEFT ATRIUM         Index LA diam:    3.80 cm 1.80 cm/m                        PULMONIC VALVE AORTA                 RVOT Peak grad: 2 mmHg Ao Root diam: 4.00 cm   SHUNTS Pulmonic VTI: 0.117 m Donnelly Angelica Electronically signed by Donnelly Angelica Signature Date/Time: 11/20/2021/5:42:36 PM    Final       ASSESSMENT/PLAN   Acute on chronic hypoxemic respiratory failure        -  Does not appear to be infectious in etiology - RVP and PCR COVID negative        -Procal is low indicative of less likely bacterial etiology/infection        - there is mild leukocytosis without steroids        - no infiltrate noted on CT chest and no signs of significant scarring or ILD        -BNP with severe elevattion despite elevated BMI >40 suggestive of cardiac/PH etiology with almost normal troponin.        -Appreciate cardiology input - Dr Corky Sox        -patient has improved post diuresis         -PT/OT today         -reviewed medications - appreciate everyone caring for this lady  Acute decompensated diastolic CHF with EF >67%         S/p TTE    - cardiology on case -appreciate input Dr Corky Sox    - appreciate input    - UOP >2l    - resumed BID aldactone 25mg      - pre-renal AKI is resolved    Probable acute decompensated pulmonary hypertension  -   Likely Group 2 & 3 currently NYHA 4 level -cardiology on case - patient for RHC when more stable -currently being diuresed and has adequate UOP with mild AKI -TTE with RV volume  overload    Advanced COPD with OSA overlap and chronic hypoxemia   - continue with CPAP QHS     Bibasilar atelectasis   I have encouraged patient to use IS - she can take tidal volumes of 300cc at this time   - She is very weak and deconditioined , need to be OOB and PT/OT    -Patient received metaneb therapy with RT and feels its helping    Thank you for allowing me to participate in the care of this patient.   Patient/Family are satisfied with care plan and all questions have been answered.  This document was prepared using Dragon voice recognition software and may include unintentional dictation errors.     Ottie Glazier, M.D.  Division of [place]

## 2021-11-24 LAB — BASIC METABOLIC PANEL
Anion gap: 8 (ref 5–15)
BUN: 26 mg/dL — ABNORMAL HIGH (ref 8–23)
CO2: 31 mmol/L (ref 22–32)
Calcium: 8.5 mg/dL — ABNORMAL LOW (ref 8.9–10.3)
Chloride: 100 mmol/L (ref 98–111)
Creatinine, Ser: 0.98 mg/dL (ref 0.44–1.00)
GFR, Estimated: >60 mL/min (ref >60)
Glucose, Bld: 111 mg/dL — ABNORMAL HIGH (ref 70–99)
Potassium: 3.9 mmol/L (ref 3.5–5.1)
Sodium: 139 mmol/L (ref 135–145)

## 2021-11-24 LAB — PHOSPHORUS: Phosphorus: 3.7 mg/dL (ref 2.5–4.6)

## 2021-11-24 LAB — GLUCOSE, CAPILLARY
Glucose-Capillary: 97 mg/dL (ref 70–99)
Glucose-Capillary: 116 mg/dL — ABNORMAL HIGH (ref 70–99)
Glucose-Capillary: 129 mg/dL — ABNORMAL HIGH (ref 70–99)
Glucose-Capillary: 112 mg/dL — ABNORMAL HIGH (ref 70–99)

## 2021-11-24 LAB — MAGNESIUM: Magnesium: 2.3 mg/dL (ref 1.7–2.4)

## 2021-11-24 MED ORDER — BISACODYL 10 MG RE SUPP: 10.0000 mg | Freq: Every day | RECTAL | Status: DC | PRN

## 2021-11-24 MED ORDER — POTASSIUM CHLORIDE CRYS ER 20 MEQ PO TBCR
40.0000 meq | EXTENDED_RELEASE_TABLET | Freq: Once | ORAL | Status: AC
  Administered 2021-11-24: 09:00:00 40 meq via ORAL
  Filled 2021-11-24: qty 2

## 2021-11-24 MED ORDER — POLYETHYLENE GLYCOL 3350 17 G PO PACK
17.0000 g | PACK | Freq: Every day | ORAL | Status: DC
  Administered 2021-11-24 – 2021-11-25 (×2): 17 g via ORAL
  Filled 2021-11-24 (×3): qty 1

## 2021-11-24 MED ORDER — SENNOSIDES-DOCUSATE SODIUM 8.6-50 MG PO TABS
1.0000 | ORAL_TABLET | Freq: Two times a day (BID) | ORAL | Status: DC
  Administered 2021-11-24 – 2021-11-25 (×3): 1 via ORAL
  Filled 2021-11-24 (×3): qty 1

## 2021-11-24 NOTE — Progress Notes (Signed)
PROGRESS NOTE    Kari Medina  OEV:[id number] DOB: 22-May-1946 DOA: 11/19/2021 PCP: Venia Carbon, MD    Brief Narrative:   75 year old female history of obesity, hypertension, hyperlipidemia, obstructive sleep apnea, COPD on chronic 2 L admitted after a fall in the shower and resultant fractured ribs.  Cardiology consulted as the patient is currently undergoing evaluation pulmonary hypertension and does have clinical evidence of fluid overload.   She was recently seen in cardiology clinic on 11/28 in which time she described progressively worsening shortness of breath over the last several years.  She was initially scheduled for outpatient right heart catheterization for evaluation of pulmonary hypertension however 1 of in the hospital   She is clinically fluid overloaded on exam.  She will be diuresed aggressively.  Cardiology is consulted and may consider right heart catheterization however this may be also deferred to outpatient.  We will optimize volume status is much as possible.  Oxygen saturation and requirement has worsened to a maximum of 12 L.  Concern for worsening pulmonary hypertension.  Splinting is contributing.  No evidence of infection.  Patient appears to be responding to diuresis.  12/3: Patient seems to be slowly improving.  Diuresing effectively.  Remains dependent on 7 L nasal cannula   Assessment & Plan:   Principal Problem:   Acute on chronic respiratory failure with hypoxia (HCC) Active Problems:   COPD (chronic obstructive pulmonary disease) (HCC)   Generalized weakness   Obesity, Class III, BMI 40-49.9 (morbid obesity) ([place])   Aneurysm of thoracic aorta   Hypothyroidism   Pulmonary hypertension (HCC)   Chronic kidney disease, stage 3a (HCC)   Multiple fractures of ribs, left side, initial encounter for closed fracture   Cor pulmonale (HCC)  Acute on chronic hypoxic respiratory failure Suspected pulmonary hypertension Multifactorial  secondary to splinting from rib fractures Suspected contribution from underlying COPD/OSA Dilated RV on echo indicating pulmonary hypertension Baseline requirement 4 L Cardiology on consult Pulmonology involved in care 12/1 Net negative 570 cc on 12/2 Urine output recording is not accurate Plan: Continue Lasix 40 IV twice daily Aldactone 12.5 mg daily Propranolol reduced dose 20 mg p.o. daily Midodrine 2.5 mg 3 times daily MetaNeb therapy for alveolar recruitment Strict intake and output Wean oxygen as tolerated Goal saturation 88-92% Cardiology and pulmonology follow-up  COPD Does not appear acutely exacerbated Patient with decreased breath sounds but no wheezing or cough Schedule bronchodilators and MetaNeb Very weak and deconditioned.  Needs out of bed therapy  Generalized weakness Mechanical fall Therapy evaluations Needs out of bed therapy Home with home health services  Stable aneurysm of thoracic aorta 4.2 cm, stable appearance Outpatient surveillance  Hypothyroidism PTA Synthroid  Chronic kidney disease stage IIIa Cautious while on diuretics Creatinine stable  Type 2 diabetes mellitus with renal manifestations Hold oral agents Moderate sliding scale  Obesity class III BMI 40-49.9 Patient BMI 82.9 This complicates overall care and prognosis  DVT prophylaxis: SQ Lovenox Code Status: Full Family Communication: Husband at bedside 11/30, 12/2 Disposition Plan: Status is: Inpatient  Remains inpatient appropriate because: Acute on chronic hypoxic respiratory failure in the setting of decompensated pulmonary hypertension       Level of care: Med-Surg  Consultants:  Cardiology-[facility]  Procedures:  None  Antimicrobials: None   Subjective: Patient seen and examined.  Reports symptomatic improvement.  Objective: Vitals:   11/23/21 2035 11/24/21 0022 11/24/21 0425 11/24/21 0816  BP: 132/81 (!) 84/65 112/73 121/86  Pulse: 73 69 72 83  Resp: 20 14 16 18   Temp: 97.8 F (36.6 C) 97.7 F (36.5 C) 98.1 F (36.7 C) 97.9 F (36.6 C)  TempSrc: Oral Oral Oral   SpO2: 96% 90% 97% (!) 84%  Weight:      Height:        Intake/Output Summary (Last 24 hours) at 11/24/2021 1105 Last data filed at 11/24/2021 0900 Gross per 24 hour  Intake 720 ml  Output 600 ml  Net 120 ml   Filed Weights   11/19/21 1432 11/22/21 0500  Weight: 108.9 kg 107 kg    Examination:  General exam: No acute distress.  Appears frail Respiratory system: Mild bibasilar crackles.  Normal work of breathing.  No wheeze.  7 L Cardiovascular system: S1-S2, RRR, no murmurs, 1+ pitting edema Gastrointestinal system: Obese, NT/ND, normal bowel sounds Central nervous system: Alert and oriented. No focal neurological deficits. Extremities: Symmetric 5 x 5 power. Skin: No rashes, lesions or ulcers Psychiatry: Judgement and insight appear normal. Mood & affect appropriate.     Data Reviewed: I have personally reviewed following labs and imaging studies  CBC: Recent Labs  Lab 11/19/21 1432 11/20/21 0103  WBC 11.9* 12.1*  HGB 16.1* 15.4*  HCT 48.0* 46.8*  MCV 93.8 95.5  PLT 172 426   Basic Metabolic Panel: Recent Labs  Lab 11/19/21 1432 11/20/21 0103 11/21/21 0512 11/22/21 0339 11/23/21 0541 11/24/21 0524  NA 138  --  138 138 138 139  K 3.5  --  3.2* 3.4* 3.3* 3.9  CL 102  --  102 100 99 100  CO2 28  --  29 29 28 31   GLUCOSE 103*  --  100* 122* 105* 111*  BUN 22  --  20 23 28* 26*  CREATININE 1.15* 1.22* 1.10* 1.13* 0.94 0.98  CALCIUM 9.2  --  8.5* 8.4* 8.7* 8.5*  MG  --   --   --  2.5* 2.6* 2.3  PHOS  --   --   --   --   --  3.7   GFR: Estimated Creatinine Clearance: 61 mL/min (by C-G formula based on SCr of 0.98 mg/dL). Liver Function Tests: Recent Labs  Lab 11/19/21 1432  AST 32  ALT 19  ALKPHOS 75  BILITOT 1.6*  PROT 6.9  ALBUMIN 3.5   No results for input(s): LIPASE, AMYLASE in the last 168 hours. No results for  input(s): AMMONIA in the last 168 hours. Coagulation Profile: No results for input(s): INR, PROTIME in the last 168 hours. Cardiac Enzymes: No results for input(s): CKTOTAL, CKMB, CKMBINDEX, TROPONINI in the last 168 hours. BNP (last 3 results) No results for input(s): PROBNP in the last 8760 hours. HbA1C: No results for input(s): HGBA1C in the last 72 hours. CBG: Recent Labs  Lab 11/23/21 1023 11/23/21 1332 11/23/21 1640 11/23/21 2101 11/24/21 0818  GLUCAP 113* 145* 128* 94 97   Lipid Profile: No results for input(s): CHOL, HDL, LDLCALC, TRIG, CHOLHDL, LDLDIRECT in the last 72 hours. Thyroid Function Tests: No results for input(s): TSH, T4TOTAL, FREET4, T3FREE, THYROIDAB in the last 72 hours. Anemia Panel: No results for input(s): VITAMINB12, FOLATE, FERRITIN, TIBC, IRON, RETICCTPCT in the last 72 hours. Sepsis Labs: Recent Labs  Lab 11/21/21 0512  PROCALCITON <0.10    Recent Results (from the past 240 hour(s))  Respiratory (~20 pathogens) panel by PCR     Status: None   Collection Time: 11/20/21  1:03 AM   Specimen: Nasopharyngeal Swab; Respiratory  Result Value Ref Range Status  Adenovirus NOT DETECTED NOT DETECTED Final   Coronavirus 229E NOT DETECTED NOT DETECTED Final    Comment: (NOTE) The Coronavirus on the Respiratory Panel, DOES NOT test for the novel  Coronavirus (2019 nCoV)    Coronavirus HKU1 NOT DETECTED NOT DETECTED Final   Coronavirus NL63 NOT DETECTED NOT DETECTED Final   Coronavirus OC43 NOT DETECTED NOT DETECTED Final   Metapneumovirus NOT DETECTED NOT DETECTED Final   Rhinovirus / Enterovirus NOT DETECTED NOT DETECTED Final   Influenza A NOT DETECTED NOT DETECTED Final   Influenza B NOT DETECTED NOT DETECTED Final   Parainfluenza Virus 1 NOT DETECTED NOT DETECTED Final   Parainfluenza Virus 2 NOT DETECTED NOT DETECTED Final   Parainfluenza Virus 3 NOT DETECTED NOT DETECTED Final   Parainfluenza Virus 4 NOT DETECTED NOT DETECTED Final    Respiratory Syncytial Virus NOT DETECTED NOT DETECTED Final   Bordetella pertussis NOT DETECTED NOT DETECTED Final   Bordetella Parapertussis NOT DETECTED NOT DETECTED Final   Chlamydophila pneumoniae NOT DETECTED NOT DETECTED Final   Mycoplasma pneumoniae NOT DETECTED NOT DETECTED Final    Comment: Performed at [facility], [place] [address] [place], [place] [address]  Resp Panel by RT-PCR (Flu A&B, Covid) Nasopharyngeal Swab     Status: None   Collection Time: 11/20/21  3:59 PM   Specimen: Nasopharyngeal Swab; Nasopharyngeal(NP) swabs in vial transport medium  Result Value Ref Range Status   SARS Coronavirus 2 by RT PCR NEGATIVE NEGATIVE Final    Comment: (NOTE) SARS-CoV-2 target nucleic acids are NOT DETECTED.  The SARS-CoV-2 RNA is generally detectable in upper respiratory specimens during the acute phase of infection. The lowest concentration of SARS-CoV-2 viral copies this assay can detect is 138 copies/mL. A negative result does not preclude SARS-Cov-2 infection and should not be used as the sole basis for treatment or other patient management decisions. A negative result may occur with  improper specimen collection/handling, submission of specimen other than nasopharyngeal swab, presence of viral mutation(s) within the areas targeted by this assay, and inadequate number of viral copies(<138 copies/mL). A negative result must be combined with clinical observations, patient history, and epidemiological information. The expected result is Negative.  Fact Sheet for Patients:  EntrepreneurPulse.com.au  Fact Sheet for Healthcare Providers:  IncredibleEmployment.be  This test is no t yet approved or cleared by the [place] FDA and  has been authorized for detection and/or diagnosis of SARS-CoV-2 by FDA under an Emergency Use Authorization (EUA). This EUA will remain  in effect (meaning this test can be used) for the duration of  the COVID-19 declaration under Section 564(b)(1) of the Act, 21 U.S.C.section 360bbb-3(b)(1), unless the authorization is terminated  or revoked sooner.       Influenza A by PCR NEGATIVE NEGATIVE Final   Influenza B by PCR NEGATIVE NEGATIVE Final    Comment: (NOTE) The Xpert Xpress SARS-CoV-2/FLU/RSV plus assay is intended as an aid in the diagnosis of influenza from Nasopharyngeal swab specimens and should not be used as a sole basis for treatment. Nasal washings and aspirates are unacceptable for Xpert Xpress SARS-CoV-2/FLU/RSV testing.  Fact Sheet for Patients: EntrepreneurPulse.com.au  Fact Sheet for Healthcare Providers: IncredibleEmployment.be  This test is not yet approved or cleared by the [place] FDA and has been authorized for detection and/or diagnosis of SARS-CoV-2 by FDA under an Emergency Use Authorization (EUA). This EUA will remain in effect (meaning this test can be used) for the duration of the COVID-19 declaration under Section  564(b)(1) of the Act, 21 U.S.C. section 360bbb-3(b)(1), unless the authorization is terminated or revoked.  Performed at [facility], [address], [place], [place] [address]          Radiology Studies: No results found.      Scheduled Meds:  aspirin EC  81 mg Oral Daily   DULoxetine  60 mg Oral Daily   enoxaparin (LOVENOX) injection  0.5 mg/kg Subcutaneous Q24H   furosemide  40 mg Intravenous Q12H   insulin aspart  0-15 Units Subcutaneous TID WC   insulin aspart  0-5 Units Subcutaneous QHS   ipratropium-albuterol  3 mL Nebulization Q4H   levothyroxine  200 mcg Oral Q0600   mouth rinse  15 mL Mouth Rinse BID   midodrine  2.5 mg Oral TID WC   mometasone-formoterol  2 puff Inhalation BID   montelukast  10 mg Oral QHS   propranolol  20 mg Oral BID   rOPINIRole  3 mg Oral QHS   sodium chloride flush  3 mL Intravenous Q12H   spironolactone  25 mg Oral BID    Continuous Infusions:  sodium chloride       LOS: 5 days    Time spent: 25 minutes    Sidney Ace, MD [facility]   If 7PM-7AM, please contact night-coverage  11/24/2021, 11:05 AM

## 2021-11-24 NOTE — Progress Notes (Signed)
Pharmacy Electrolyte Monitoring Consult:  Pharmacy consulted to assist in monitoring and replacing electrolytes in this 75 y.o. female admitted on 11/19/2021 with Fall and Shortness of Breath -diuresis  Labs:  Sodium (mmol/L)  Date Value  11/24/2021 139   Potassium (mmol/L)  Date Value  11/24/2021 3.9   Magnesium (mg/dL)  Date Value  11/24/2021 2.3   Phosphorus (mg/dL)  Date Value  11/24/2021 3.7   Calcium (mg/dL)  Date Value  11/24/2021 8.5 (L)   Albumin (g/dL)  Date Value  11/19/2021 3.5    Assessment/Plan: K 3.9  Mag 2.3  Phos 3.7  Scr 0.98 -currently ordered lasix 40mg  IV q12h, spironolactone -will order KCL 40 meq po x 1, given lasix order -f/u electrolytes w. Am labs  Kari Medina A 11/24/2021 9:05 AM

## 2021-11-24 NOTE — Progress Notes (Signed)
[facility] Cardiology    SUBJECTIVE: Patient states she feels somewhat better less short of breath not any worse still requiring higher dose of supplemental oxygen denies any pain has gotten out of bed and sat in a chair today and feels better   Vitals:   11/23/21 2035 11/24/21 0022 11/24/21 0425 11/24/21 0816  BP: 132/81 (!) 84/65 112/73 121/86  Pulse: 73 69 72 83  Resp: 20 14 16 18   Temp: 97.8 F (36.6 C) 97.7 F (36.5 C) 98.1 F (36.7 C) 97.9 F (36.6 C)  TempSrc: Oral Oral Oral   SpO2: 96% 90% 97% (!) 84%  Weight:      Height:         Intake/Output Summary (Last 24 hours) at 11/24/2021 8921 Last data filed at 11/23/2021 2035 Gross per 24 hour  Intake 716 ml  Output 600 ml  Net 116 ml      PHYSICAL EXAM  General: Well developed, well nourished, in no acute distress HEENT:  Normocephalic and atramatic Neck:  No JVD.  Lungs: Clear bilaterally to auscultation and percussion. Heart: HRRR . Normal S1 and S2 without gallops or murmurs.  Abdomen: Bowel sounds are positive, abdomen soft and non-tender  Msk:  Back normal, normal gait. Normal strength and tone for age. Extremities: No clubbing, cyanosis or edema.   Neuro: Alert and oriented X 3. Psych:  Good affect, responds appropriately   LABS: Basic Metabolic Panel: Recent Labs    11/23/21 0541 11/24/21 0524  NA 138 139  K 3.3* 3.9  CL 99 100  CO2 28 31  GLUCOSE 105* 111*  BUN 28* 26*  CREATININE 0.94 0.98  CALCIUM 8.7* 8.5*  MG 2.6* 2.3  PHOS  --  3.7   Liver Function Tests: No results for input(s): AST, ALT, ALKPHOS, BILITOT, PROT, ALBUMIN in the last 72 hours. No results for input(s): LIPASE, AMYLASE in the last 72 hours. CBC: No results for input(s): WBC, NEUTROABS, HGB, HCT, MCV, PLT in the last 72 hours. Cardiac Enzymes: No results for input(s): CKTOTAL, CKMB, CKMBINDEX, TROPONINI in the last 72 hours. BNP: Invalid input(s): POCBNP D-Dimer: No results for input(s): DDIMER in the last 72  hours. Hemoglobin A1C: No results for input(s): HGBA1C in the last 72 hours. Fasting Lipid Panel: No results for input(s): CHOL, HDL, LDLCALC, TRIG, CHOLHDL, LDLDIRECT in the last 72 hours. Thyroid Function Tests: No results for input(s): TSH, T4TOTAL, T3FREE, THYROIDAB in the last 72 hours.  Invalid input(s): FREET3 Anemia Panel: No results for input(s): VITAMINB12, FOLATE, FERRITIN, TIBC, IRON, RETICCTPCT in the last 72 hours.  No results found.   Echo preserved overall left ventricular function greater than 75%    ASSESSMENT AND PLAN:  Principal Problem:   Acute on chronic respiratory failure with hypoxia (HCC) Active Problems:   COPD (chronic obstructive pulmonary disease) (HCC)   Generalized weakness   Obesity, Class III, BMI 40-49.9 (morbid obesity) ([place])   Aneurysm of thoracic aorta   Hypothyroidism   Pulmonary hypertension (HCC)   Chronic kidney disease, stage 3a (HCC)   Multiple fractures of ribs, left side, initial encounter for closed fracture   Cor pulmonale (HCC)    Plan Continue supplemental oxygen patient is requiring more oxygen than she normally takes at home she is no worse but not necessarily better Congestive heart failure with preserved left ventricular function reasonably stable with improvement Pulmonary hypertension contributing to shortness of breath we will arrange right heart cath with Dr. Corky Sox Obesity recommend weight loss exercise portion  control Consider CPAP or BiPAP as necessary for respiratory failure Consider physical therapy up out of bed to chair Consider nephrology input for renal insufficiency Agree with incentive spirometer to help with bibasilar atelectasis    Yolonda Kida, MD 11/24/2021 9:14 AM

## 2021-11-25 LAB — BASIC METABOLIC PANEL
Anion gap: 7 (ref 5–15)
BUN: 27 mg/dL — ABNORMAL HIGH (ref 8–23)
CO2: 30 mmol/L (ref 22–32)
Calcium: 8.6 mg/dL — ABNORMAL LOW (ref 8.9–10.3)
Chloride: 101 mmol/L (ref 98–111)
Creatinine, Ser: 1.04 mg/dL — ABNORMAL HIGH (ref 0.44–1.00)
GFR, Estimated: 57 mL/min — ABNORMAL LOW (ref >60)
Glucose, Bld: 88 mg/dL (ref 70–99)
Potassium: 4 mmol/L (ref 3.5–5.1)
Sodium: 138 mmol/L (ref 135–145)

## 2021-11-25 LAB — GLUCOSE, CAPILLARY
Glucose-Capillary: 85 mg/dL (ref 70–99)
Glucose-Capillary: 153 mg/dL — ABNORMAL HIGH (ref 70–99)
Glucose-Capillary: 105 mg/dL — ABNORMAL HIGH (ref 70–99)
Glucose-Capillary: 137 mg/dL — ABNORMAL HIGH (ref 70–99)

## 2021-11-25 LAB — MAGNESIUM: Magnesium: 2.4 mg/dL (ref 1.7–2.4)

## 2021-11-25 MED ORDER — IPRATROPIUM-ALBUTEROL 0.5-2.5 (3) MG/3ML IN SOLN
3.0000 mL | Freq: Four times a day (QID) | RESPIRATORY_TRACT | Status: DC
  Administered 2021-11-25 – 2021-11-26 (×2): 3 mL via RESPIRATORY_TRACT
  Filled 2021-11-25 (×2): qty 3

## 2021-11-25 NOTE — Progress Notes (Signed)
PROGRESS NOTE    Kari Medina  TKW:[id number] DOB: [birth year] DOA: 11/19/2021 PCP: Venia Carbon, MD    Brief Narrative:   75 year old female history of obesity, hypertension, hyperlipidemia, obstructive sleep apnea, COPD on chronic 2 L admitted after a fall in the shower and resultant fractured ribs.  Cardiology consulted as the patient is currently undergoing evaluation pulmonary hypertension and does have clinical evidence of fluid overload.   She was recently seen in cardiology clinic on 11/28 in which time she described progressively worsening shortness of breath over the last several years.  She was initially scheduled for outpatient right heart catheterization for evaluation of pulmonary hypertension however 1 of in the hospital   She is clinically fluid overloaded on exam.  She will be diuresed aggressively.  Cardiology is consulted and may consider right heart catheterization however this may be also deferred to outpatient.  We will optimize volume status is much as possible.  Oxygen saturation and requirement has worsened to a maximum of 12 L.  Concern for worsening pulmonary hypertension.  Splinting is contributing.  No evidence of infection.  Patient appears to be responding to diuresis.  12/3: Patient seems to be slowly improving.  Diuresing effectively.  Remains dependent on 7 L nasal cannula  12/5: Unclear whether pulse oximetry is accurate.  Patient appears to be diuresing and lung sounds improving however oxygenation remains dependent on 7 L.   Assessment & Plan:   Principal Problem:   Acute on chronic respiratory failure with hypoxia (HCC) Active Problems:   COPD (chronic obstructive pulmonary disease) (HCC)   Generalized weakness   Obesity, Class III, BMI 40-49.9 (morbid obesity) ([place])   Aneurysm of thoracic aorta   Hypothyroidism   Pulmonary hypertension (HCC)   Chronic kidney disease, stage 3a (HCC)   Multiple fractures of ribs, left side, initial  encounter for closed fracture   Cor pulmonale (HCC)  Acute on chronic hypoxic respiratory failure Suspected pulmonary hypertension Multifactorial secondary to splinting from rib fractures Suspected contribution from underlying COPD/OSA Dilated RV on echo indicating pulmonary hypertension Baseline requirement 4 L Cardiology on consult Pulmonology involved in care 12/1 Net negative 570 cc on 12/2 Urine output recording is not accurate Unclear whether pulse oximetry is accurate Plan: Continue Lasix 40 IV twice daily Aldactone 25 twice daily  careful monitoring of kidney function Propranolol 20 mg daily Midodrine 2.5 mg 3 times daily MetaNeb therapy for alveolar recruitment Strict intake and output Wean oxygen as tolerated Goal saturation 88-92% Cardiology and pulmonology follow-up  COPD Does not appear acutely exacerbated Patient with decreased breath sounds but no wheezing or cough Schedule bronchodilators and MetaNeb Very weak and deconditioned.  Needs out of bed therapy continue scheduled bronchodilators and MetaNeb  Generalized weakness Mechanical fall Therapy evaluations Needs out of bed therapy Home with home health services Not appropriate for discharge at  Stable aneurysm of thoracic aorta 4.2 cm, stable appearance Outpatient surveillance  Hypothyroidism PTA Synthroid  Chronic kidney disease stage IIIa Cautious while on diuretics Creatinine stable  Type 2 diabetes mellitus with renal manifestations Hold oral agents Moderate sliding scale  Obesity class III BMI 40-49.9 Patient BMI 92.4 This complicates overall care and prognosis  DVT prophylaxis: SQ Lovenox Code Status: Full Family Communication: Husband at bedside 11/30, 12/2 Disposition Plan: Status is: Inpatient  Remains inpatient appropriate because: Acute on chronic hypoxic respiratory failure in the setting of decompensated pulmonary hypertension  Level of care: Med-Surg  Consultants:   Cardiology-[facility]  Procedures:  None  Antimicrobials: None   Subjective: Patient seen and examined.  No acute changes over interval  Objective: Vitals:   11/25/21 0550 11/25/21 0747 11/25/21 1039 11/25/21 1100  BP: 126/88 134/76  106/74  Pulse: 86 (!) 58 90 72  Resp: 18 18 20 16   Temp: 98.1 F (36.7 C) 97.7 F (36.5 C)  (!) 97.5 F (36.4 C)  TempSrc: Oral Oral  Oral  SpO2: 91% 94% 92% 92%  Weight:      Height:        Intake/Output Summary (Last 24 hours) at 11/25/2021 1217 Last data filed at 11/25/2021 1000 Gross per 24 hour  Intake 398 ml  Output 1500 ml  Net -1102 ml   Filed Weights   11/19/21 1432 11/22/21 0500 11/25/21 0500  Weight: 108.9 kg 107 kg 106.2 kg    Examination:  General exam: No acute distress.  Appears frail Respiratory system: Mild bibasilar crackles.  Normal work of breathing.  Negative wheeze.  Centimeters Cardiovascular system: S1-S2, RRR, no murmurs, trace pitting edema Gastrointestinal system: Obese, NT/ND, normal bowel sounds Central nervous system: Alert and oriented. No focal neurological deficits. Extremities: Symmetric 5 x 5 power. Skin: No rashes, lesions or ulcers Psychiatry: Judgement and insight appear normal. Mood & affect appropriate.     Data Reviewed: I have personally reviewed following labs and imaging studies  CBC: Recent Labs  Lab 11/19/21 1432 11/20/21 0103  WBC 11.9* 12.1*  HGB 16.1* 15.4*  HCT 48.0* 46.8*  MCV 93.8 95.5  PLT 172 384   Basic Metabolic Panel: Recent Labs  Lab 11/21/21 0512 11/22/21 0339 11/23/21 0541 11/24/21 0524 11/25/21 0414  NA 138 138 138 139 138  K 3.2* 3.4* 3.3* 3.9 4.0  CL 102 100 99 100 101  CO2 29 29 28 31 30   GLUCOSE 100* 122* 105* 111* 88  BUN 20 23 28* 26* 27*  CREATININE 1.10* 1.13* 0.94 0.98 1.04*  CALCIUM 8.5* 8.4* 8.7* 8.5* 8.6*  MG  --  2.5* 2.6* 2.3 2.4  PHOS  --   --   --  3.7  --    GFR: Estimated Creatinine Clearance: 57.3 mL/min (A) (by C-G  formula based on SCr of 1.04 mg/dL (H)). Liver Function Tests: Recent Labs  Lab 11/19/21 1432  AST 32  ALT 19  ALKPHOS 75  BILITOT 1.6*  PROT 6.9  ALBUMIN 3.5   No results for input(s): LIPASE, AMYLASE in the last 168 hours. No results for input(s): AMMONIA in the last 168 hours. Coagulation Profile: No results for input(s): INR, PROTIME in the last 168 hours. Cardiac Enzymes: No results for input(s): CKTOTAL, CKMB, CKMBINDEX, TROPONINI in the last 168 hours. BNP (last 3 results) No results for input(s): PROBNP in the last 8760 hours. HbA1C: No results for input(s): HGBA1C in the last 72 hours. CBG: Recent Labs  Lab 11/24/21 1204 11/24/21 1657 11/24/21 2242 11/25/21 0749 11/25/21 1112  GLUCAP 116* 129* 112* 85 153*   Lipid Profile: No results for input(s): CHOL, HDL, LDLCALC, TRIG, CHOLHDL, LDLDIRECT in the last 72 hours. Thyroid Function Tests: No results for input(s): TSH, T4TOTAL, FREET4, T3FREE, THYROIDAB in the last 72 hours. Anemia Panel: No results for input(s): VITAMINB12, FOLATE, FERRITIN, TIBC, IRON, RETICCTPCT in the last 72 hours. Sepsis Labs: Recent Labs  Lab 11/21/21 0512  PROCALCITON <0.10    Recent Results (from the past 240 hour(s))  Respiratory (~20 pathogens) panel by PCR     Status: None   Collection Time: 11/20/21  1:03 AM  Specimen: Nasopharyngeal Swab; Respiratory  Result Value Ref Range Status   Adenovirus NOT DETECTED NOT DETECTED Final   Coronavirus 229E NOT DETECTED NOT DETECTED Final    Comment: (NOTE) The Coronavirus on the Respiratory Panel, DOES NOT test for the novel  Coronavirus (2019 nCoV)    Coronavirus HKU1 NOT DETECTED NOT DETECTED Final   Coronavirus NL63 NOT DETECTED NOT DETECTED Final   Coronavirus OC43 NOT DETECTED NOT DETECTED Final   Metapneumovirus NOT DETECTED NOT DETECTED Final   Rhinovirus / Enterovirus NOT DETECTED NOT DETECTED Final   Influenza A NOT DETECTED NOT DETECTED Final   Influenza B NOT DETECTED  NOT DETECTED Final   Parainfluenza Virus 1 NOT DETECTED NOT DETECTED Final   Parainfluenza Virus 2 NOT DETECTED NOT DETECTED Final   Parainfluenza Virus 3 NOT DETECTED NOT DETECTED Final   Parainfluenza Virus 4 NOT DETECTED NOT DETECTED Final   Respiratory Syncytial Virus NOT DETECTED NOT DETECTED Final   Bordetella pertussis NOT DETECTED NOT DETECTED Final   Bordetella Parapertussis NOT DETECTED NOT DETECTED Final   Chlamydophila pneumoniae NOT DETECTED NOT DETECTED Final   Mycoplasma pneumoniae NOT DETECTED NOT DETECTED Final    Comment: Performed at [facility], [place]. [address], [place], [place] [address]  Resp Panel by RT-PCR (Flu A&B, Covid) Nasopharyngeal Swab     Status: None   Collection Time: 11/20/21  3:59 PM   Specimen: Nasopharyngeal Swab; Nasopharyngeal(NP) swabs in vial transport medium  Result Value Ref Range Status   SARS Coronavirus 2 by RT PCR NEGATIVE NEGATIVE Final    Comment: (NOTE) SARS-CoV-2 target nucleic acids are NOT DETECTED.  The SARS-CoV-2 RNA is generally detectable in upper respiratory specimens during the acute phase of infection. The lowest concentration of SARS-CoV-2 viral copies this assay can detect is 138 copies/mL. A negative result does not preclude SARS-Cov-2 infection and should not be used as the sole basis for treatment or other patient management decisions. A negative result may occur with  improper specimen collection/handling, submission of specimen other than nasopharyngeal swab, presence of viral mutation(s) within the areas targeted by this assay, and inadequate number of viral copies(<138 copies/mL). A negative result must be combined with clinical observations, patient history, and epidemiological information. The expected result is Negative.  Fact Sheet for Patients:  EntrepreneurPulse.com.au  Fact Sheet for Healthcare Providers:  IncredibleEmployment.be  This test is no t yet approved  or cleared by the [place] FDA and  has been authorized for detection and/or diagnosis of SARS-CoV-2 by FDA under an Emergency Use Authorization (EUA). This EUA will remain  in effect (meaning this test can be used) for the duration of the COVID-19 declaration under Section 564(b)(1) of the Act, 21 U.S.C.section 360bbb-3(b)(1), unless the authorization is terminated  or revoked sooner.       Influenza A by PCR NEGATIVE NEGATIVE Final   Influenza B by PCR NEGATIVE NEGATIVE Final    Comment: (NOTE) The Xpert Xpress SARS-CoV-2/FLU/RSV plus assay is intended as an aid in the diagnosis of influenza from Nasopharyngeal swab specimens and should not be used as a sole basis for treatment. Nasal washings and aspirates are unacceptable for Xpert Xpress SARS-CoV-2/FLU/RSV testing.  Fact Sheet for Patients: EntrepreneurPulse.com.au  Fact Sheet for Healthcare Providers: IncredibleEmployment.be  This test is not yet approved or cleared by the [place] FDA and has been authorized for detection and/or diagnosis of SARS-CoV-2 by FDA under an Emergency Use Authorization (EUA). This EUA will remain in effect (meaning this test  can be used) for the duration of the COVID-19 declaration under Section 564(b)(1) of the Act, 21 U.S.C. section 360bbb-3(b)(1), unless the authorization is terminated or revoked.  Performed at [facility], [address], [place], [place] [address]          Radiology Studies: No results found.      Scheduled Meds:  aspirin EC  81 mg Oral Daily   DULoxetine  60 mg Oral Daily   enoxaparin (LOVENOX) injection  0.5 mg/kg Subcutaneous Q24H   furosemide  40 mg Intravenous Q12H   insulin aspart  0-15 Units Subcutaneous TID WC   insulin aspart  0-5 Units Subcutaneous QHS   ipratropium-albuterol  3 mL Nebulization Q4H   levothyroxine  200 mcg Oral Q0600   mouth rinse  15 mL Mouth Rinse BID   midodrine  2.5  mg Oral TID WC   mometasone-formoterol  2 puff Inhalation BID   montelukast  10 mg Oral QHS   polyethylene glycol  17 g Oral Daily   propranolol  20 mg Oral BID   rOPINIRole  3 mg Oral QHS   senna-docusate  1 tablet Oral BID   sodium chloride flush  3 mL Intravenous Q12H   spironolactone  25 mg Oral BID   Continuous Infusions:  sodium chloride       LOS: 6 days    Time spent: 25 minutes    Sidney Ace, MD [facility]   If 7PM-7AM, please contact night-coverage  11/25/2021, 12:17 PM

## 2021-11-25 NOTE — TOC Progression Note (Signed)
Transition of Care [facility]) - Progression Note    Patient Details  Name: Kari Medina MRN: [id number] Date of Birth: 11-28-1946  Transition of Care [facility]) CM/SW [place], RN Phone Number: 11/25/2021, 12:01 PM  Clinical Narrative:   Patient is not medically ready for discharge at this time.  TOC to follow.    Expected Discharge Plan: [place] Barriers to Discharge: Barriers Resolved  Expected Discharge Plan and Services Expected Discharge Plan: [place] In-house Referral: Clinical Social Work   Post Acute Care Choice: IP Rehab Living arrangements for the past 2 months: [place]: PT [place]: [place] ([place]) Date [place]: 11/23/21 Time [place]: [place] Representative spoke with at [place]: [place] (SDOH) Interventions    Readmission Risk Interventions No flowsheet data found.

## 2021-11-25 NOTE — Progress Notes (Signed)
[facility] Cardiology    SUBJECTIVE: Patient states to be doing slightly better she is not any worse she is dyspneic with any activity but at rest she is doing reasonably well with supplemental oxygen.  Sitting up comfortably in the chair now   Vitals:   11/25/21 1039 11/25/21 1100 11/25/21 1300 11/25/21 1400  BP:  106/74    Pulse: 90 72    Resp: 20 16    Temp:  (!) 97.5 F (36.4 C)    TempSrc:  Oral    SpO2: 92% 92% 94% 94%  Weight:      Height:         Intake/Output Summary (Last 24 hours) at 11/25/2021 1528 Last data filed at 11/25/2021 1437 Gross per 24 hour  Intake 638 ml  Output 2100 ml  Net -1462 ml      PHYSICAL EXAM  General: Well developed, well nourished, in no acute distress HEENT:  Normocephalic and atramatic Neck:  No JVD.  Lungs: Clear bilaterally to auscultation and percussion. Heart: HRRR . Normal S1 and S2 without gallops or murmurs.  Abdomen: Bowel sounds are positive, abdomen soft and non-tender  Msk:  Back normal, normal gait. Normal strength and tone for age. Extremities: No clubbing, cyanosis or edema.   Neuro: Alert and oriented X 3. Psych:  Good affect, responds appropriately   LABS: Basic Metabolic Panel: Recent Labs    11/24/21 0524 11/25/21 0414  NA 139 138  K 3.9 4.0  CL 100 101  CO2 31 30  GLUCOSE 111* 88  BUN 26* 27*  CREATININE 0.98 1.04*  CALCIUM 8.5* 8.6*  MG 2.3 2.4  PHOS 3.7  --    Liver Function Tests: No results for input(s): AST, ALT, ALKPHOS, BILITOT, PROT, ALBUMIN in the last 72 hours. No results for input(s): LIPASE, AMYLASE in the last 72 hours. CBC: No results for input(s): WBC, NEUTROABS, HGB, HCT, MCV, PLT in the last 72 hours. Cardiac Enzymes: No results for input(s): CKTOTAL, CKMB, CKMBINDEX, TROPONINI in the last 72 hours. BNP: Invalid input(s): POCBNP D-Dimer: No results for input(s): DDIMER in the last 72 hours. Hemoglobin A1C: No results for input(s): HGBA1C in the last 72 hours. Fasting Lipid  Panel: No results for input(s): CHOL, HDL, LDLCALC, TRIG, CHOLHDL, LDLDIRECT in the last 72 hours. Thyroid Function Tests: No results for input(s): TSH, T4TOTAL, T3FREE, THYROIDAB in the last 72 hours.  Invalid input(s): FREET3 Anemia Panel: No results for input(s): VITAMINB12, FOLATE, FERRITIN, TIBC, IRON, RETICCTPCT in the last 72 hours.  No results found.   Echo preserved left ventricular function    ASSESSMENT AND PLAN:  Principal Problem:   Acute on chronic respiratory failure with hypoxia (HCC) Active Problems:   COPD (chronic obstructive pulmonary disease) (HCC)   Generalized weakness   Obesity, Class III, BMI 40-49.9 (morbid obesity) ([place])   Aneurysm of thoracic aorta   Hypothyroidism   Pulmonary hypertension (HCC)   Chronic kidney disease, stage 3a (HCC)   Multiple fractures of ribs, left side, initial encounter for closed fracture   Cor pulmonale ([place])    Plan Still short of breath still requiring supplemental high flow oxygen above her normal Probable pulmonary hypertension will require right heart cath probably as an outpatient Recommend weight loss exercise portion control Continue CPAP or BiPAP as needed Agree with incentive spirometer Physical and Occupational Therapy as necessary   Yolonda Kida, MD 11/25/2021 3:28 PM

## 2021-11-25 NOTE — Progress Notes (Signed)
Pharmacy Electrolyte Monitoring Consult:  Pharmacy consulted to assist in monitoring and replacing electrolytes in this 75 y.o. female admitted on 11/19/2021 with Fall and Shortness of Breath -diuresis  Labs:  Sodium (mmol/L)  Date Value  11/25/2021 138   Potassium (mmol/L)  Date Value  11/25/2021 4.0   Magnesium (mg/dL)  Date Value  11/25/2021 2.4   Phosphorus (mg/dL)  Date Value  11/24/2021 3.7   Calcium (mg/dL)  Date Value  11/25/2021 8.6 (L)   Albumin (g/dL)  Date Value  11/19/2021 3.5    Assessment/Plan: K 4.0  Mag 2.4  Scr 1.04 -currently ordered lasix 40mg  IV q12h, spironolactone -no additional supplementation -f/u electrolytes w. Am labs  Paulina Fusi, PharmD, BCPS 11/25/2021 2:44 PM

## 2021-11-25 NOTE — Progress Notes (Addendum)
Physical Therapy Treatment Patient Details Name: Kari Medina MRN: [id number] DOB: 07-09-1946 Today's Date: 11/25/2021   History of Present Illness Kari Medina is a 75 y.o. female with medical history significant for COPD on home O2 at 2 L, class III obesity, CKD 3a, DM, thoracic aortic aneurysm, history of melanoma, pulmonary hypertension scheduled for right heart cath in December presents to the ED with a several month history of gradually worsening shortness of breath , lower extremity edema and generalized weakness  leading to a fall in the shower on the day of arrival, hitting her left chest.  She did not hit her head or lose consciousness. Had a fall a week prior without injury.    PT Comments    Pt received in recliner with husband present but exiting during session, pt agreeable to tx. Pt requires min assist for multiple sit>stand from recliner with good awareness of safe hand placement. Pt tolerates standing ~10 seconds then 1 minute with CGA & seated rest break. Pt is able to ambulate 60 ft into hallway with impaired gait pattern as noted below & with significant forward trunk flexion throughout standing & gait. Pt requires increased supplemental O2 & nursing staff notified. Will continue to follow pt acutely to address balance, endurance, & gait with LRAD.  Pt received on 2L/min via nasal cannula with SpO2 >90% After ambulating SpO2 dropped to 77% with lowest reading of 72% when sitting in recliner so O2 increased to 5L/min with pt requiring extended time to recover to >/= 90%. Pt left on 5L/min with SpO2 = 90% & nursing staff aware. Of note, pt with poor return demo of pursed lip breathing with pt reporting she cannot breathe through her nose. Pt only endorsing slight SOB.    Recommendations for follow up therapy are one component of a multi-disciplinary discharge planning process, led by the attending physician.  Recommendations may be updated based on patient status,  additional functional criteria and insurance authorization.  Follow Up Recommendations  Home health PT     Assistance Recommended at Discharge Frequent or constant Supervision/Assistance  Equipment Recommendations  Rolling walker (2 wheels);BSC/3in1    Recommendations for Other Services       Precautions / Restrictions Precautions Precautions: Fall Restrictions Weight Bearing Restrictions: No     Mobility  Bed Mobility               General bed mobility comments: pt received & left sitting in recliner    Transfers Overall transfer level: Needs assistance Equipment used: Rolling walker (2 wheels) Transfers: Sit to/from Stand Sit to Stand: Min assist (extra time to power up to standing)                Ambulation/Gait Ambulation/Gait assistance: Supervision;Min guard Gait Distance (Feet): 60 Feet Assistive device: Rollator (4 wheels) Gait Pattern/deviations: Step-to pattern;Trunk flexed;Decreased step length - left;Decreased step length - right;Decreased stride length Gait velocity: decreased     General Gait Details: Significantly flexed trunk (almost ~45 degrees) & pushing RW too far out in front of her because of this, steps outside of RW when turning.   Stairs             Wheelchair Mobility    Modified Rankin (Stroke Patients Only)       Balance Overall balance assessment: Needs assistance;History of Falls Sitting-balance support: Feet supported;Bilateral upper extremity supported Sitting balance-Leahy Scale: Good Sitting balance - Comments: supervision static sitting   Standing balance support: During functional activity;Bilateral upper  extremity supported Standing balance-Leahy Scale: Fair Standing balance comment: BUE support on RW with CGA<>supervision                            Cognition Arousal/Alertness: Awake/alert Behavior During Therapy: WFL for tasks assessed/performed Overall Cognitive Status: Within  Functional Limits for tasks assessed                                          Exercises      General Comments        Pertinent Vitals/Pain Pain Assessment: 0-10 Pain Score: 4  Pain Location: L ribs Pain Descriptors / Indicators: Discomfort;Sore Pain Intervention(s): Limited activity within patient's tolerance;Monitored during session;Patient requesting pain meds-RN notified    Home Living                          Prior Function            PT Goals (current goals can now be found in the care plan section) Acute Rehab PT Goals Patient Stated Goal: to get better and go home PT Goal Formulation: With patient/family Time For Goal Achievement: 12/04/21 Potential to Achieve Goals: Good Progress towards PT goals: Progressing toward goals    Frequency    Min 2X/week      PT Plan Current plan remains appropriate    Co-evaluation              AM-PAC PT "6 Clicks" Mobility   Outcome Measure  Help needed turning from your back to your side while in a flat bed without using bedrails?: None Help needed moving from lying on your back to sitting on the side of a flat bed without using bedrails?: None Help needed moving to and from a bed to a chair (including a wheelchair)?: None Help needed standing up from a chair using your arms (e.g., wheelchair or bedside chair)?: None Help needed to walk in hospital room?: None Help needed climbing 3-5 steps with a railing? : A Lot 6 Click Score: 22    End of Session Equipment Utilized During Treatment: Oxygen Activity Tolerance: Patient limited by fatigue Patient left: in chair;with call bell/phone within reach Nurse Communication: Mobility status (O2) PT Visit Diagnosis: Muscle weakness (generalized) (M62.81);Difficulty in walking, not elsewhere classified (R26.2);Unsteadiness on feet (R26.81) Pain - Right/Left: Left Pain - part of body:  (ribs)     Time: 7824-2353 PT Time Calculation (min)  (ACUTE ONLY): 26 min  Charges:  $Therapeutic Activity: 23-37 mins                     Kari Medina, PT, DPT 11/25/21, 3:43 PM    Waunita Schooner 11/25/2021, 3:40 PM

## 2021-11-25 NOTE — Progress Notes (Signed)
Pulmonary Medicine          Date: 11/25/2021,   MRN# [id number] Kari Medina 1946-09-16     AdmissionWeight: 108.9 kg                 CurrentWeight: 106.2 kg   Referring physician: Dr Corky Sox   CHIEF COMPLAINT:   Increased O2 requirement with acute on chronic hypoxemic respiratory failure   HISTORY OF PRESENT ILLNESS   This is a pleasant 75 yo F with hx of COPD and OSA overlap syndrome chronically hypoxemic with CPAP and O2 bleed in at home, with extensive comborbid history including MDD, dyslipidemia, hiatal hernia, RLS, Asplenic status, raynauds, fibromyalgia, PONV, RLS came in to hospital post mechanical fall with trauma to torso and resultant rib fractures.  She is being evaluated with cardiology for CHF and PH and is noted to have severe hypoxemia requiring upwards of 12L/min supplemental O2.  She had CTPE done which I reviewed, there is very generous heart size and bibasilar atelectasis but entire lung appears with interstitial edemaPCCM consulted for further evaluation and management.    11/25/21- patient is improved, shes down to 4L/min [place]. She also has HFNC which she is not using and that one is set to 6L/min.  She is wanting to do more PT and go home after possible RHC with cardiology.  Family at bedside we reviewed medical plan.   PAST MEDICAL HISTORY   Past Medical History:  Diagnosis Date   Asthma    Chronic hypoxemic respiratory failure (HCC)    uses O2 with exertion and with CPAP   COPD (chronic obstructive pulmonary disease) (HCC)    Depression    Diabetes mellitus without complication (HCC)    Dyspnea    doe   Dysrhythmia    extra beat   Fatty liver    Fibromyalgia    Generalized osteoarthritis of multiple sites    History of hiatal hernia    Hypertension    Hypothyroidism    Melanoma ([place]) 07/2018   Right leg   OSA (obstructive sleep apnea)    Pain    chronic ruq and back pain   Panic attacks    PONV (postoperative nausea and  vomiting)    after thyroidectomy   Raynaud disease    RLS (restless legs syndrome)    Spleen absent    TOLD ABSENT THEN TOLD DOES HAVE SPLEEN. PATIENT IS UNCERTAIN   Tremor, essential    Wears dentures    full upper and lower     SURGICAL HISTORY   Past Surgical History:  Procedure Laterality Date   CATARACT EXTRACTION W/PHACO Right 03/04/2021   Procedure: CATARACT EXTRACTION PHACO AND INTRAOCULAR LENS PLACEMENT ([facility]) RIGHT DIABETIC 3.98 00:33.2;  Surgeon: Eulogio Bear, MD;  Location: [facility];  Service: Ophthalmology;  Laterality: Right;  Diabetic - oral meds   CATARACT EXTRACTION W/PHACO Left 03/18/2021   Procedure: CATARACT EXTRACTION PHACO AND INTRAOCULAR LENS PLACEMENT ([place]) LEFT;  Surgeon: Eulogio Bear, MD;  Location: [facility];  Service: Ophthalmology;  Laterality: Left;  2.96 0:27.9   CHOLECYSTECTOMY     DILATATION & CURETTAGE/HYSTEROSCOPY WITH MYOSURE N/A 11/10/2018   Procedure: DILATATION & CURETTAGE/HYSTEROSCOPY WITH MYOSURE/MYOMECTOMY;  Surgeon: Aletha Halim, MD;  Location: [facility];  Service: Gynecology;  Laterality: N/A;  possible myosure.  Please use myosure scope, do not open myosure blades but have in the room   JOINT REPLACEMENT Bilateral    2008/2011  MELANOMA EXCISION  07/2018   REVERSE SHOULDER ARTHROPLASTY Right 05/28/2017   Procedure: REVERSE SHOULDER ARTHROPLASTY;  Surgeon: Corky Mull, MD;  Location: [facility];  Service: Orthopedics;  Laterality: Right;   RHINOPLASTY  1972   THYROIDECTOMY  2006   TOTAL KNEE ARTHROPLASTY     bilateral     FAMILY HISTORY   Family History  Problem Relation Age of Onset   Osteoarthritis Mother    Diabetes Mother    Cirrhosis Mother    Cancer Father    Kidney cancer Father    Bladder Cancer Father    Heart disease Brother        stents in 1 brother   Breast cancer Neg Hx      SOCIAL HISTORY   Social History   Tobacco Use   Smoking status: Former     Packs/day: 0.25    Years: 5.00    Pack years: 1.25    Types: Cigarettes    Quit date: 12/22/1988    Years since quitting: 32.9   Smokeless tobacco: Never  Vaping Use   Vaping Use: Never used  Substance Use Topics   Alcohol use: No   Drug use: No     MEDICATIONS    Home Medication:    Current Medication:  Current Facility-Administered Medications:    0.9 %  sodium chloride infusion, 250 mL, Intravenous, PRN, Athena Masse, MD   acetaminophen (TYLENOL) tablet 650 mg, 650 mg, Oral, Q4H PRN, Athena Masse, MD   albuterol (PROVENTIL) (2.5 MG/3ML) 0.083% nebulizer solution 2.5 mg, 2.5 mg, Nebulization, Q6H PRN, Renda Rolls, RPH   ALPRAZolam Duanne Moron) tablet 0.25 mg, 0.25 mg, Oral, BID PRN, Judd Gaudier V, MD, 0.25 mg at 11/22/21 0539   aspirin EC tablet 81 mg, 81 mg, Oral, Daily, Sreenath, Sudheer B, MD, 81 mg at 11/25/21 1009   bisacodyl (DULCOLAX) suppository 10 mg, 10 mg, Rectal, Daily PRN, Priscella Mann, Sudheer B, MD   DULoxetine (CYMBALTA) DR capsule 60 mg, 60 mg, Oral, Daily, Sreenath, Sudheer B, MD, 60 mg at 11/25/21 1009   enoxaparin (LOVENOX) injection 55 mg, 0.5 mg/kg, Subcutaneous, Q24H, Judd Gaudier V, MD, 55 mg at 11/25/21 0755   fluticasone (FLONASE) 50 MCG/ACT nasal spray 1 spray, 1 spray, Each Nare, Daily PRN, Sreenath, Sudheer B, MD   furosemide (LASIX) injection 40 mg, 40 mg, Intravenous, Q12H, Sreenath, Sudheer B, MD, 40 mg at 11/25/21 1009   HYDROcodone-acetaminophen (NORCO/VICODIN) 5-325 MG per tablet 1 tablet, 1 tablet, Oral, Q4H PRN, Athena Masse, MD, 1 tablet at 11/24/21 0845   insulin aspart (novoLOG) injection 0-15 Units, 0-15 Units, Subcutaneous, TID WC, Sreenath, Sudheer B, MD, 2 Units at 11/24/21 1748   insulin aspart (novoLOG) injection 0-5 Units, 0-5 Units, Subcutaneous, QHS, Sreenath, Sudheer B, MD   ipratropium-albuterol (DUONEB) 0.5-2.5 (3) MG/3ML nebulizer solution 3 mL, 3 mL, Nebulization, Q4H, Sreenath, Sudheer B, MD, 3 mL at 11/25/21 1114    levothyroxine (SYNTHROID) tablet 200 mcg, 200 mcg, Oral, Q0600, Priscella Mann, Sudheer B, MD, 200 mcg at 11/25/21 0554   MEDLINE mouth rinse, 15 mL, Mouth Rinse, BID, Judd Gaudier V, MD, 15 mL at 11/25/21 1010   midodrine (PROAMATINE) tablet 2.5 mg, 2.5 mg, Oral, TID WC, Dandra Shambaugh, MD, 2.5 mg at 11/24/21 1748   mometasone-formoterol (DULERA) 200-5 MCG/ACT inhaler 2 puff, 2 puff, Inhalation, BID, Priscella Mann, Sudheer B, MD, 2 puff at 11/25/21 0757   montelukast (SINGULAIR) tablet 10 mg, 10 mg, Oral, QHS, Sreenath, Sudheer B, MD, 10  mg at 11/24/21 2116   morphine 2 MG/ML injection 2 mg, 2 mg, Intravenous, Q2H PRN, Athena Masse, MD, 2 mg at 11/23/21 2202   ondansetron [facility]) injection 4 mg, 4 mg, Intravenous, Q6H PRN, Athena Masse, MD   polyethylene glycol (MIRALAX / GLYCOLAX) packet 17 g, 17 g, Oral, Daily, Sreenath, Sudheer B, MD, 17 g at 11/25/21 1009   propranolol (INDERAL) tablet 20 mg, 20 mg, Oral, BID, Ottie Glazier, MD, 20 mg at 11/25/21 1009   rOPINIRole (REQUIP) tablet 3 mg, 3 mg, Oral, QHS, Sreenath, Sudheer B, MD, 3 mg at 11/24/21 2115   senna-docusate (Senokot-S) tablet 1 tablet, 1 tablet, Oral, BID, Priscella Mann, Sudheer B, MD, 1 tablet at 11/25/21 1009   sodium chloride flush (NS) 0.9 % injection 3 mL, 3 mL, Intravenous, Q12H, Judd Gaudier V, MD, 3 mL at 11/25/21 1010   sodium chloride flush (NS) 0.9 % injection 3 mL, 3 mL, Intravenous, PRN, Athena Masse, MD   spironolactone (ALDACTONE) tablet 25 mg, 25 mg, Oral, BID, Ottie Glazier, MD, 25 mg at 11/25/21 1009    ALLERGIES   Latex, Nickel, Pramipexole, Prednisone, Topiramate, Citalopram, Paroxetine hcl, and Sertraline     REVIEW OF SYSTEMS    Review of Systems:  Gen:  Denies  fever, sweats, chills weigh loss  HEENT: Denies blurred vision, double vision, ear pain, eye pain, hearing loss, nose bleeds, sore throat Cardiac:  No dizziness, chest pain or heaviness, chest tightness,edema Resp:   Denies cough or sputum  porduction, shortness of breath,wheezing, hemoptysis,  Gi: Denies swallowing difficulty, stomach pain, nausea or vomiting, diarrhea, constipation, bowel incontinence Gu:  Denies bladder incontinence, burning urine Ext:   Denies Joint pain, stiffness or swelling Skin: Denies  skin rash, easy bruising or bleeding or hives Endoc:  Denies polyuria, polydipsia , polyphagia or weight change Psych:   Denies depression, insomnia or hallucinations   Other:  All other systems negative   VS: BP 106/74 (BP Location: Right Arm)   Pulse 72   Temp (!) 97.5 F (36.4 C) (Oral)   Resp 16   Ht 5\' 4"  (1.626 m)   Wt 106.2 kg   SpO2 92%   BMI 40.19 kg/m      PHYSICAL EXAM    GENERAL:NAD, no fevers, chills, no weakness no fatigue HEAD: Normocephalic, atraumatic.  EYES: Pupils equal, round, reactive to light. Extraocular muscles intact. No scleral icterus.  MOUTH: Moist mucosal membrane. Dentition intact. No abscess noted.  EAR, NOSE, THROAT: Clear without exudates. No external lesions.  NECK: Supple. No thyromegaly. No nodules. No JVD.  PULMONARY: mild bibasilar rhonchi CARDIOVASCULAR: S1 and S2. Regular rate and rhythm. No murmurs, rubs, or gallops. No edema. Pedal pulses 2+ bilaterally.  GASTROINTESTINAL: Soft, nontender, nondistended. No masses. Positive bowel sounds. No hepatosplenomegaly.  MUSCULOSKELETAL: No swelling, clubbing, or edema. Range of motion full in all extremities.  NEUROLOGIC: Cranial nerves II through XII are intact. No gross focal neurological deficits. Sensation intact. Reflexes intact.  SKIN: No ulceration, lesions, rashes, or cyanosis. Skin warm and dry. Turgor intact.  PSYCHIATRIC: Mood, affect within normal limits. The patient is awake, alert and oriented x 3. Insight, judgment intact.       IMAGING    DG Chest 2 View  Result Date: 11/19/2021 CLINICAL DATA:  Shortness of breath EXAM: CHEST - 2 VIEW COMPARISON:  Radiograph 10/23/2018, chest CT 09/26/2021 FINDINGS:  Unchanged cardiomediastinal silhouette. Prominent pulmonary arteries. Bibasilar airspace opacities. Reverse right shoulder arthroplasty. No acute osseous abnormality. Thoracic  spondylosis. IMPRESSION: Bibasilar airspace opacities which could be atelectasis or developing infection. Electronically Signed   By: Maurine Simmering M.D.   On: 11/19/2021 15:13   CT Angio Chest PE W and/or Wo Contrast  Result Date: 11/19/2021 CLINICAL DATA:  Status post fall. EXAM: CT ANGIOGRAPHY CHEST WITH CONTRAST TECHNIQUE: Multidetector CT imaging of the chest was performed using the standard protocol during bolus administration of intravenous contrast. Multiplanar CT image reconstructions and MIPs were obtained to evaluate the vascular anatomy. CONTRAST:  147mL OMNIPAQUE IOHEXOL 350 MG/ML SOLN COMPARISON:  September 26, 2021 FINDINGS: Cardiovascular: Stable 4.2 cm diameter aneurysmal dilatation of the ascending thoracic aorta is seen. Stable pulmonary artery enlargement is seen. Satisfactory opacification of the pulmonary arteries to the segmental level. No evidence of pulmonary embolism. There is mild cardiomegaly. No pericardial effusion. Mediastinum/Nodes: Mild AP window 1 mild pretracheal lymphadenopathy is seen. Thyroid gland, trachea, and esophagus demonstrate no significant findings. Lungs/Pleura: Mild linear atelectasis is seen within the posterolateral aspect of the right apex, anteromedial aspect of the left upper lobe and posterior aspects of the bilateral lung bases. There is no evidence of acute infiltrate, pleural effusion or pneumothorax. Upper Abdomen: No acute abnormality. Musculoskeletal: Acute, anterolateral seventh and eighth left rib fractures are seen. Acute, displaced posterior ninth and tenth left rib fractures are also noted. A right shoulder replacement is noted. Multilevel degenerative changes are seen throughout the thoracic spine. Review of the MIP images confirms the above findings. IMPRESSION: 1. Acute,  seventh, eighth, ninth and tenth left rib fractures, as described above. 2. Stable 4.2 cm diameter aneurysmal dilatation of the ascending thoracic aorta. 3. Stable pulmonary artery enlargement, which may represent pulmonary arterial hypertension. Electronically Signed   By: Virgina Norfolk M.D.   On: 11/19/2021 23:02   DG Chest Port 1 View  Result Date: 11/21/2021 CLINICAL DATA:  Shortness of breath, hypoxia EXAM: PORTABLE CHEST 1 VIEW COMPARISON:  Chest radiograph 11/19/2021, CTA chest 11/19/2021 FINDINGS: The heart is enlarged, unchanged. There are patchy opacities in perihilar regions and right lung base. The left costophrenic angle is not well delineated, likely due to cardiomegaly and adjacent fat as opposed to a pleural effusion. There is no right pleural effusion. There is no pneumothorax. Right shoulder arthroplasty hardware is again noted the left-sided rib fracture seen on the prior CTA chest are not well seen on the current study. IMPRESSION: 1. Patchy opacities in the perihilar regions and right lung base may reflect atelectasis; however, developing infection could have a similar appearance. 2. Cardiomegaly. Electronically Signed   By: Valetta Mole M.D.   On: 11/21/2021 10:41   ECHOCARDIOGRAM COMPLETE  Result Date: 11/20/2021    ECHOCARDIOGRAM REPORT   Patient Name:   FIORELLA HANAHAN [place] Date of Exam: 11/20/2021 Medical Rec #:  [id number]             Height:       64.0 in Accession #:    [id number]            Weight:       240.0 lb Date of Birth:  08/11/1946              BSA:          2.114 m Patient Age:    76 years              BP:           Not listed in chart/Not  listed in chart mmHg Patient Gender: F                     HR:           Not listed in chart bpm. Exam Location:  [facility] Procedure: 2D Echo, Color Doppler and Cardiac Doppler Indications:     Pulmonary hypertension I27.2  History:         Patient has no prior history of  Echocardiogram examinations.                  COPD; Risk Factors:Diabetes and Hypertension.  Sonographer:     Sherrie Sport Referring Phys:  [id number] Kathrin Ruddy ORGEL Diagnosing Phys: Donnelly Angelica  Sonographer Comments: Technically difficult study due to poor echo windows, no parasternal window and no apical window. Image acquisition challenging due to COPD and Obtainable view was a modified low PLAX/ Apical. IMPRESSIONS  1. Left ventricular ejection fraction, by estimation, is >75%. The left ventricle has hyperdynamic function. The left ventricle has no regional wall motion abnormalities. Left ventricular diastolic function could not be evaluated. There is the interventricular septum is flattened in diastole ('D' shaped left ventricle), consistent with right ventricular volume overload.  2. Unable to estimate pulmonary pressures due to technical difficulties. Right ventricular systolic function is moderately reduced. The right ventricular size is severely enlarged.  3. The mitral valve is degenerative. No evidence of mitral valve regurgitation. Moderate mitral annular calcification.  4. The aortic valve is calcified.  5. Aortic dilatation noted. There is mild dilatation of the ascending aorta, measuring 42 mm. Conclusion(s)/Recommendation(s): Findings consistent with pulmonary HTN, but evaluation limited by technical difficulties. FINDINGS  Left Ventricle: Left ventricular ejection fraction, by estimation, is >75%. The left ventricle has hyperdynamic function. The left ventricle has no regional wall motion abnormalities. The left ventricular internal cavity size was small. The interventricular septum is flattened in diastole ('D' shaped left ventricle), consistent with right ventricular volume overload. Left ventricular diastolic function could not be evaluated. Right Ventricle: Unable to estimate pulmonary pressures due to technical difficulties. The right ventricular size is severely enlarged. Right vetricular wall  thickness was not assessed. Right ventricular systolic function is moderately reduced. Pericardium: There is no evidence of pericardial effusion. Mitral Valve: The mitral valve is degenerative in appearance. Moderate mitral annular calcification. No evidence of mitral valve regurgitation. Tricuspid Valve: The tricuspid valve is normal in structure. Aortic Valve: The aortic valve is calcified. Aorta: Aortic dilatation noted. There is mild dilatation of the ascending aorta, measuring 42 mm.  LEFT VENTRICLE PLAX 2D LVIDd:         2.90 cm LVIDs:         1.90 cm LV PW:         1.50 cm LV IVS:        1.00 cm  LEFT ATRIUM         Index LA diam:    3.80 cm 1.80 cm/m                        PULMONIC VALVE AORTA                 RVOT Peak grad: 2 mmHg Ao Root diam: 4.00 cm   SHUNTS Pulmonic VTI: 0.117 m Donnelly Angelica Electronically signed by Donnelly Angelica Signature Date/Time: 11/20/2021/5:42:36 PM    Final       ASSESSMENT/PLAN   Acute on chronic hypoxemic respiratory failure        -  Does not appear to be infectious in etiology - RVP and PCR COVID negative        -Procal is low indicative of less likely bacterial etiology/infection        - there is mild leukocytosis without steroids        - no infiltrate noted on CT chest and no signs of significant scarring or ILD        -BNP with severe elevattion despite elevated BMI >40 suggestive of cardiac/PH etiology with almost normal troponin.        -Appreciate cardiology input - Dr Corky Sox        -patient has improved post diuresis         -PT/OT today         -reviewed medications - appreciate everyone caring for this lady  Acute decompensated diastolic CHF with EF >21%         S/p TTE    - cardiology on case -appreciate input Dr Corky Sox    - appreciate input    - UOP >2l    - resumed BID aldactone 25mg      - pre-renal AKI is resolved    Probable acute decompensated pulmonary hypertension  -   Likely Group 2 & 3 currently NYHA 4 level -cardiology on case -  patient for RHC when more stable -currently being diuresed and has adequate UOP with mild AKI -TTE with RV volume overload    Advanced COPD with OSA overlap and chronic hypoxemia   - continue with CPAP QHS     Bibasilar atelectasis   I have encouraged patient to use IS - she can take tidal volumes of 300cc at this time   - She is very weak and deconditioined , need to be OOB and PT/OT    -Patient received metaneb therapy with RT and feels its helping    Thank you for allowing me to participate in the care of this patient.   Patient/Family are satisfied with care plan and all questions have been answered.  This document was prepared using Dragon voice recognition software and may include unintentional dictation errors.     Ottie Glazier, M.D.  Division of [place]

## 2021-11-26 LAB — BASIC METABOLIC PANEL
Anion gap: 9 (ref 5–15)
BUN: 26 mg/dL — ABNORMAL HIGH (ref 8–23)
CO2: 28 mmol/L (ref 22–32)
Calcium: 8.7 mg/dL — ABNORMAL LOW (ref 8.9–10.3)
Chloride: 100 mmol/L (ref 98–111)
Creatinine, Ser: 1.02 mg/dL — ABNORMAL HIGH (ref 0.44–1.00)
GFR, Estimated: 58 mL/min — ABNORMAL LOW (ref >60)
Glucose, Bld: 105 mg/dL — ABNORMAL HIGH (ref 70–99)
Potassium: 3.7 mmol/L (ref 3.5–5.1)
Sodium: 137 mmol/L (ref 135–145)

## 2021-11-26 LAB — GLUCOSE, CAPILLARY
Glucose-Capillary: 79 mg/dL (ref 70–99)
Glucose-Capillary: 186 mg/dL — ABNORMAL HIGH (ref 70–99)

## 2021-11-26 LAB — MAGNESIUM: Magnesium: 2.4 mg/dL (ref 1.7–2.4)

## 2021-11-26 MED ORDER — PROPRANOLOL HCL 20 MG PO TABS: 20.0000 mg | ORAL_TABLET | Freq: Two times a day (BID) | ORAL | 1 refills | Status: DC

## 2021-11-26 MED ORDER — IPRATROPIUM-ALBUTEROL 0.5-2.5 (3) MG/3ML IN SOLN
3.0000 mL | Freq: Three times a day (TID) | RESPIRATORY_TRACT | Status: DC
  Administered 2021-11-26: 15:00:00 3 mL via RESPIRATORY_TRACT
  Filled 2021-11-26: qty 3

## 2021-11-26 MED ORDER — SPIRONOLACTONE 25 MG PO TABS: 25.0000 mg | ORAL_TABLET | Freq: Every day | ORAL | 1 refills | Status: DC

## 2021-11-26 MED ORDER — POTASSIUM CHLORIDE CRYS ER 20 MEQ PO TBCR
40.0000 meq | EXTENDED_RELEASE_TABLET | Freq: Once | ORAL | Status: AC
  Administered 2021-11-26: 40 meq via ORAL
  Filled 2021-11-26: qty 2

## 2021-11-26 MED ORDER — FUROSEMIDE 40 MG PO TABS: 40.0000 mg | ORAL_TABLET | Freq: Every day | ORAL | 1 refills | Status: DC

## 2021-11-26 NOTE — Progress Notes (Signed)
Pt scored a 6 on the RT assessment. She will continue on the metaneb because she thinks it is helping but the duoneb is to be changed to TID.

## 2021-11-26 NOTE — Progress Notes (Signed)
Pulmonary Medicine          Date: 11/26/2021,   MRN# [id number] Kari Medina 16-Sep-1946     AdmissionWeight: 108.9 kg                 CurrentWeight: 106.2 kg   Referring physician: Dr Corky Sox   CHIEF COMPLAINT:   Increased O2 requirement with acute on chronic hypoxemic respiratory failure   HISTORY OF PRESENT ILLNESS   This is a pleasant 75 yo F with hx of COPD and OSA overlap syndrome chronically hypoxemic with CPAP and O2 bleed in at home, with extensive comborbid history including MDD, dyslipidemia, hiatal hernia, RLS, Asplenic status, raynauds, fibromyalgia, PONV, RLS came in to hospital post mechanical fall with trauma to torso and resultant rib fractures.  She is being evaluated with cardiology for CHF and PH and is noted to have severe hypoxemia requiring upwards of 12L/min supplemental O2.  She had CTPE done which I reviewed, there is very generous heart size and bibasilar atelectasis but entire lung appears with interstitial edemaPCCM consulted for further evaluation and management.    11/26/21- patient is stable , she walked around hallway and actually did not desaturate even on room air.  Her Raynauds does hinder sPO2 measurements on fingertip so ear seems to have better readings per RN today.  She is non-labored and smiles during interview and examination.  She will be able [facility] pulmonary within 1 wk.   PAST MEDICAL HISTORY   Past Medical History:  Diagnosis Date   Asthma    Chronic hypoxemic respiratory failure (HCC)    uses O2 with exertion and with CPAP   COPD (chronic obstructive pulmonary disease) (HCC)    Depression    Diabetes mellitus without complication (HCC)    Dyspnea    doe   Dysrhythmia    extra beat   Fatty liver    Fibromyalgia    Generalized osteoarthritis of multiple sites    History of hiatal hernia    Hypertension    Hypothyroidism    Melanoma ([name]) 07/2018   Right leg   OSA (obstructive sleep apnea)     Pain    chronic ruq and back pain   Panic attacks    PONV (postoperative nausea and vomiting)    after thyroidectomy   Raynaud disease    RLS (restless legs syndrome)    Spleen absent    TOLD ABSENT THEN TOLD DOES HAVE SPLEEN. PATIENT IS UNCERTAIN   Tremor, essential    Wears dentures    full upper and lower     SURGICAL HISTORY   Past Surgical History:  Procedure Laterality Date   CATARACT EXTRACTION W/PHACO Right 03/04/2021   Procedure: CATARACT EXTRACTION PHACO AND INTRAOCULAR LENS PLACEMENT ([facility]) RIGHT DIABETIC 3.98 00:33.2;  Surgeon: Eulogio Bear, MD;  Location: [place];  Service: Ophthalmology;  Laterality: Right;  Diabetic - oral meds   CATARACT EXTRACTION W/PHACO Left 03/18/2021   Procedure: CATARACT EXTRACTION PHACO AND INTRAOCULAR LENS PLACEMENT ([place]) LEFT;  Surgeon: Eulogio Bear, MD;  Location: [place];  Service: Ophthalmology;  Laterality: Left;  2.96 0:27.9   CHOLECYSTECTOMY     DILATATION & CURETTAGE/HYSTEROSCOPY WITH MYOSURE N/A 11/10/2018   Procedure: DILATATION & CURETTAGE/HYSTEROSCOPY WITH MYOSURE/MYOMECTOMY;  Surgeon: Aletha Halim, MD;  Location: [place];  Service: Gynecology;  Laterality: N/A;  possible myosure.  Please use myosure scope, do not open myosure blades but have in the room  JOINT REPLACEMENT Bilateral    2008/2011   MELANOMA EXCISION  07/2018   REVERSE SHOULDER ARTHROPLASTY Right 05/28/2017   Procedure: REVERSE SHOULDER ARTHROPLASTY;  Surgeon: Corky Mull, MD;  Location: [facility];  Service: Orthopedics;  Laterality: Right;   RHINOPLASTY  1972   THYROIDECTOMY  2006   TOTAL KNEE ARTHROPLASTY     bilateral     FAMILY HISTORY   Family History  Problem Relation Age of Onset   Osteoarthritis Mother    Diabetes Mother    Cirrhosis Mother    Cancer Father    Kidney cancer Father    Bladder Cancer Father    Heart disease Brother        stents in 1 brother   Breast cancer Neg Hx       SOCIAL HISTORY   Social History   Tobacco Use   Smoking status: Former    Packs/day: 0.25    Years: 5.00    Pack years: 1.25    Types: Cigarettes    Quit date: 12/22/1988    Years since quitting: 32.9   Smokeless tobacco: Never  Vaping Use   Vaping Use: Never used  Substance Use Topics   Alcohol use: No   Drug use: No     MEDICATIONS    Home Medication:    Current Medication:  Current Facility-Administered Medications:    0.9 %  sodium chloride infusion, 250 mL, Intravenous, PRN, Athena Masse, MD   acetaminophen (TYLENOL) tablet 650 mg, 650 mg, Oral, Q4H PRN, Athena Masse, MD   albuterol (PROVENTIL) (2.5 MG/3ML) 0.083% nebulizer solution 2.5 mg, 2.5 mg, Nebulization, Q6H PRN, Belue, Nathan S, RPH   ALPRAZolam Duanne Moron) tablet 0.25 mg, 0.25 mg, Oral, BID PRN, Judd Gaudier V, MD, 0.25 mg at 11/22/21 0539   aspirin EC tablet 81 mg, 81 mg, Oral, Daily, Sreenath, Sudheer B, MD, 81 mg at 11/26/21 6237   bisacodyl (DULCOLAX) suppository 10 mg, 10 mg, Rectal, Daily PRN, Priscella Mann, Sudheer B, MD   DULoxetine (CYMBALTA) DR capsule 60 mg, 60 mg, Oral, Daily, Sreenath, Sudheer B, MD, 60 mg at 11/26/21 0908   enoxaparin (LOVENOX) injection 55 mg, 0.5 mg/kg, Subcutaneous, Q24H, Judd Gaudier V, MD, 55 mg at 11/26/21 0906   fluticasone (FLONASE) 50 MCG/ACT nasal spray 1 spray, 1 spray, Each Nare, Daily PRN, Sreenath, Sudheer B, MD   furosemide (LASIX) injection 40 mg, 40 mg, Intravenous, Q12H, Sreenath, Sudheer B, MD, 40 mg at 11/26/21 0907   HYDROcodone-acetaminophen (NORCO/VICODIN) 5-325 MG per tablet 1 tablet, 1 tablet, Oral, Q4H PRN, Athena Masse, MD, 1 tablet at 11/26/21 0907   insulin aspart (novoLOG) injection 0-15 Units, 0-15 Units, Subcutaneous, TID WC, Sreenath, Sudheer B, MD, 3 Units at 11/25/21 1234   insulin aspart (novoLOG) injection 0-5 Units, 0-5 Units, Subcutaneous, QHS, Sreenath, Sudheer B, MD   ipratropium-albuterol (DUONEB) 0.5-2.5 (3) MG/3ML nebulizer  solution 3 mL, 3 mL, Nebulization, QID, Sreenath, Sudheer B, MD, 3 mL at 11/26/21 0831   levothyroxine (SYNTHROID) tablet 200 mcg, 200 mcg, Oral, Q0600, Priscella Mann, Sudheer B, MD, 200 mcg at 11/26/21 0908   MEDLINE mouth rinse, 15 mL, Mouth Rinse, BID, Judd Gaudier V, MD, 15 mL at 11/26/21 0908   midodrine (PROAMATINE) tablet 2.5 mg, 2.5 mg, Oral, TID WC, Sreenath, Sudheer B, MD, 2.5 mg at 11/26/21 0907   mometasone-formoterol (DULERA) 200-5 MCG/ACT inhaler 2 puff, 2 puff, Inhalation, BID, Priscella Mann, Sudheer B, MD, 2 puff at 11/26/21 0908   montelukast (SINGULAIR) tablet 10  mg, 10 mg, Oral, QHS, Sreenath, Sudheer B, MD, 10 mg at 11/25/21 2044   ondansetron [facility]) injection 4 mg, 4 mg, Intravenous, Q6H PRN, Athena Masse, MD   polyethylene glycol (MIRALAX / GLYCOLAX) packet 17 g, 17 g, Oral, Daily, Sreenath, Sudheer B, MD, 17 g at 11/25/21 1009   rOPINIRole (REQUIP) tablet 3 mg, 3 mg, Oral, QHS, Sreenath, Sudheer B, MD, 3 mg at 11/25/21 2044   senna-docusate (Senokot-S) tablet 1 tablet, 1 tablet, Oral, BID, Priscella Mann, Sudheer B, MD, 1 tablet at 11/25/21 2044   sodium chloride flush (NS) 0.9 % injection 3 mL, 3 mL, Intravenous, Q12H, Athena Masse, MD, 3 mL at 11/26/21 0909   sodium chloride flush (NS) 0.9 % injection 3 mL, 3 mL, Intravenous, PRN, Athena Masse, MD   spironolactone (ALDACTONE) tablet 25 mg, 25 mg, Oral, BID, Ottie Glazier, MD, 25 mg at 11/26/21 0908    ALLERGIES   Latex, Nickel, Pramipexole, Prednisone, Topiramate, Citalopram, Paroxetine hcl, and Sertraline     REVIEW OF SYSTEMS    Review of Systems:  Gen:  Denies  fever, sweats, chills weigh loss  HEENT: Denies blurred vision, double vision, ear pain, eye pain, hearing loss, nose bleeds, sore throat Cardiac:  No dizziness, chest pain or heaviness, chest tightness,edema Resp:   Denies cough or sputum porduction, shortness of breath,wheezing, hemoptysis,  Gi: Denies swallowing difficulty, stomach pain, nausea or  vomiting, diarrhea, constipation, bowel incontinence Gu:  Denies bladder incontinence, burning urine Ext:   Denies Joint pain, stiffness or swelling Skin: Denies  skin rash, easy bruising or bleeding or hives Endoc:  Denies polyuria, polydipsia , polyphagia or weight change Psych:   Denies depression, insomnia or hallucinations   Other:  All other systems negative   VS: BP 130/77 (BP Location: Right Arm)   Pulse 84   Temp (!) 97.5 F (36.4 C) (Oral)   Resp (!) 21   Ht 5\' 4"  (1.626 m)   Wt 106.2 kg   SpO2 (!) 88%   BMI 40.19 kg/m      PHYSICAL EXAM    GENERAL:NAD, no fevers, chills, no weakness no fatigue HEAD: Normocephalic, atraumatic.  EYES: Pupils equal, round, reactive to light. Extraocular muscles intact. No scleral icterus.  MOUTH: Moist mucosal membrane. Dentition intact. No abscess noted.  EAR, NOSE, THROAT: Clear without exudates. No external lesions.  NECK: Supple. No thyromegaly. No nodules. No JVD.  PULMONARY: mild bibasilar rhonchi CARDIOVASCULAR: S1 and S2. Regular rate and rhythm. No murmurs, rubs, or gallops. No edema. Pedal pulses 2+ bilaterally.  GASTROINTESTINAL: Soft, nontender, nondistended. No masses. Positive bowel sounds. No hepatosplenomegaly.  MUSCULOSKELETAL: No swelling, clubbing, or edema. Range of motion full in all extremities.  NEUROLOGIC: Cranial nerves II through XII are intact. No gross focal neurological deficits. Sensation intact. Reflexes intact.  SKIN: No ulceration, lesions, rashes, or cyanosis. Skin warm and dry. Turgor intact.  PSYCHIATRIC: Mood, affect within normal limits. The patient is awake, alert and oriented x 3. Insight, judgment intact.       IMAGING    DG Chest 2 View  Result Date: 11/19/2021 CLINICAL DATA:  Shortness of breath EXAM: CHEST - 2 VIEW COMPARISON:  Radiograph 10/23/2018, chest CT 09/26/2021 FINDINGS: Unchanged cardiomediastinal silhouette. Prominent pulmonary arteries. Bibasilar airspace opacities.  Reverse right shoulder arthroplasty. No acute osseous abnormality. Thoracic spondylosis. IMPRESSION: Bibasilar airspace opacities which could be atelectasis or developing infection. Electronically Signed   By: Maurine Simmering M.D.   On: 11/19/2021 15:13   CT  Angio Chest PE W and/or Wo Contrast  Result Date: 11/19/2021 CLINICAL DATA:  Status post fall. EXAM: CT ANGIOGRAPHY CHEST WITH CONTRAST TECHNIQUE: Multidetector CT imaging of the chest was performed using the standard protocol during bolus administration of intravenous contrast. Multiplanar CT image reconstructions and MIPs were obtained to evaluate the vascular anatomy. CONTRAST:  141mL OMNIPAQUE IOHEXOL 350 MG/ML SOLN COMPARISON:  September 26, 2021 FINDINGS: Cardiovascular: Stable 4.2 cm diameter aneurysmal dilatation of the ascending thoracic aorta is seen. Stable pulmonary artery enlargement is seen. Satisfactory opacification of the pulmonary arteries to the segmental level. No evidence of pulmonary embolism. There is mild cardiomegaly. No pericardial effusion. Mediastinum/Nodes: Mild AP window 1 mild pretracheal lymphadenopathy is seen. Thyroid gland, trachea, and esophagus demonstrate no significant findings. Lungs/Pleura: Mild linear atelectasis is seen within the posterolateral aspect of the right apex, anteromedial aspect of the left upper lobe and posterior aspects of the bilateral lung bases. There is no evidence of acute infiltrate, pleural effusion or pneumothorax. Upper Abdomen: No acute abnormality. Musculoskeletal: Acute, anterolateral seventh and eighth left rib fractures are seen. Acute, displaced posterior ninth and tenth left rib fractures are also noted. A right shoulder replacement is noted. Multilevel degenerative changes are seen throughout the thoracic spine. Review of the MIP images confirms the above findings. IMPRESSION: 1. Acute, seventh, eighth, ninth and tenth left rib fractures, as described above. 2. Stable 4.2 cm diameter  aneurysmal dilatation of the ascending thoracic aorta. 3. Stable pulmonary artery enlargement, which may represent pulmonary arterial hypertension. Electronically Signed   By: Virgina Norfolk M.D.   On: 11/19/2021 23:02   DG Chest Port 1 View  Result Date: 11/21/2021 CLINICAL DATA:  Shortness of breath, hypoxia EXAM: PORTABLE CHEST 1 VIEW COMPARISON:  Chest radiograph 11/19/2021, CTA chest 11/19/2021 FINDINGS: The heart is enlarged, unchanged. There are patchy opacities in perihilar regions and right lung base. The left costophrenic angle is not well delineated, likely due to cardiomegaly and adjacent fat as opposed to a pleural effusion. There is no right pleural effusion. There is no pneumothorax. Right shoulder arthroplasty hardware is again noted the left-sided rib fracture seen on the prior CTA chest are not well seen on the current study. IMPRESSION: 1. Patchy opacities in the perihilar regions and right lung base may reflect atelectasis; however, developing infection could have a similar appearance. 2. Cardiomegaly. Electronically Signed   By: Valetta Mole M.D.   On: 11/21/2021 10:41   ECHOCARDIOGRAM COMPLETE  Result Date: 11/20/2021    ECHOCARDIOGRAM REPORT   Patient Name:   Kari Medina Date of Exam: 11/20/2021 Medical Rec #:  [id number]             Height:       64.0 in Accession #:    [id number]            Weight:       240.0 lb Date of Birth:  1946-12-18              BSA:          2.114 m Patient Age:    60 years              BP:           Not listed in chart/Not  listed in chart mmHg Patient Gender: F                     HR:           Not listed in chart bpm. Exam Location:  [facility] Procedure: 2D Echo, Color Doppler and Cardiac Doppler Indications:     Pulmonary hypertension I27.2  History:         Patient has no prior history of Echocardiogram examinations.                  COPD; Risk Factors:Diabetes and Hypertension.  Sonographer:      Sherrie Sport Referring Phys:  [id number] Kathrin Ruddy ORGEL Diagnosing Phys: Donnelly Angelica  Sonographer Comments: Technically difficult study due to poor echo windows, no parasternal window and no apical window. Image acquisition challenging due to COPD and Obtainable view was a modified low PLAX/ Apical. IMPRESSIONS  1. Left ventricular ejection fraction, by estimation, is >75%. The left ventricle has hyperdynamic function. The left ventricle has no regional wall motion abnormalities. Left ventricular diastolic function could not be evaluated. There is the interventricular septum is flattened in diastole ('D' shaped left ventricle), consistent with right ventricular volume overload.  2. Unable to estimate pulmonary pressures due to technical difficulties. Right ventricular systolic function is moderately reduced. The right ventricular size is severely enlarged.  3. The mitral valve is degenerative. No evidence of mitral valve regurgitation. Moderate mitral annular calcification.  4. The aortic valve is calcified.  5. Aortic dilatation noted. There is mild dilatation of the ascending aorta, measuring 42 mm. Conclusion(s)/Recommendation(s): Findings consistent with pulmonary HTN, but evaluation limited by technical difficulties. FINDINGS  Left Ventricle: Left ventricular ejection fraction, by estimation, is >75%. The left ventricle has hyperdynamic function. The left ventricle has no regional wall motion abnormalities. The left ventricular internal cavity size was small. The interventricular septum is flattened in diastole ('D' shaped left ventricle), consistent with right ventricular volume overload. Left ventricular diastolic function could not be evaluated. Right Ventricle: Unable to estimate pulmonary pressures due to technical difficulties. The right ventricular size is severely enlarged. Right vetricular wall thickness was not assessed. Right ventricular systolic function is moderately reduced. Pericardium: There is no  evidence of pericardial effusion. Mitral Valve: The mitral valve is degenerative in appearance. Moderate mitral annular calcification. No evidence of mitral valve regurgitation. Tricuspid Valve: The tricuspid valve is normal in structure. Aortic Valve: The aortic valve is calcified. Aorta: Aortic dilatation noted. There is mild dilatation of the ascending aorta, measuring 42 mm.  LEFT VENTRICLE PLAX 2D LVIDd:         2.90 cm LVIDs:         1.90 cm LV PW:         1.50 cm LV IVS:        1.00 cm  LEFT ATRIUM         Index LA diam:    3.80 cm 1.80 cm/m                        PULMONIC VALVE AORTA                 RVOT Peak grad: 2 mmHg Ao Root diam: 4.00 cm   SHUNTS Pulmonic VTI: 0.117 m Donnelly Angelica Electronically signed by Donnelly Angelica Signature Date/Time: 11/20/2021/5:42:36 PM    Final       ASSESSMENT/PLAN   Acute on chronic hypoxemic respiratory failure        -  resolved        - was likely due to CHF and PH exacerbation         -reviewed medical plan with patient        - reviewed DC plan with Attending physician Dr Priscella Mann - appreciate collaboration  Acute decompensated diastolic CHF with EF >27%         S/p TTE    - cardiology on case -appreciate input Dr Corky Sox    - appreciate input    - UOP >2l    - resumed BID aldactone 25mg      - pre-renal AKI is resolved    Probable acute decompensated pulmonary hypertension  -   Likely Group 2 & 3 currently NYHA 4 level on admission  -cardiology on case - patient for RHC when more stable -currently being diuresed and has adequate UOP with mild AKI -TTE with RV volume overload    Advanced COPD with OSA overlap and chronic hypoxemia   - continue with CPAP QHS     Bibasilar atelectasis   I have encouraged patient to use IS - she can take tidal volumes of 300cc at this time   - She is very weak and deconditioined , need to be OOB and PT/OT    -Patient received metaneb therapy with RT and feels its helping    Thank you for allowing me to  participate in the care of this patient.   Patient/Family are satisfied with care plan and all questions have been answered.  This document was prepared using Dragon voice recognition software and may include unintentional dictation errors.     Ottie Glazier, M.D.  Division of [place]

## 2021-11-26 NOTE — Discharge Summary (Signed)
Physician Discharge Summary  Kari Medina YKZ:[id number] DOB: Sep 01, 1946 DOA: 11/19/2021  PCP: Venia Carbon, MD  Admit date: 11/19/2021 Discharge date: 11/26/2021  Admitted From: Home Disposition: Home with home health  Recommendations for Outpatient Follow-up:  Follow up with PCP in 1-2 weeks Follow-up with cardiology 1 week Follow-up with pulmonologist 1 to 2 weeks  Home Health: Yes, PT, OT, RN, aide Equipment/Devices: None  Discharge Condition: Stable CODE STATUS: Full Diet recommendation: Regular  Brief/Interim Summary: 75 year old female history of obesity, hypertension, hyperlipidemia, obstructive sleep apnea, COPD on chronic 2 L admitted after a fall in the shower and resultant fractured ribs.  Cardiology consulted as the patient is currently undergoing evaluation pulmonary hypertension and does have clinical evidence of fluid overload.   She was recently seen in cardiology clinic on 11/28 in which time she described progressively worsening shortness of breath over the last several years.  She was initially scheduled for outpatient right heart catheterization for evaluation of pulmonary hypertension however 1 of in the hospital   She is clinically fluid overloaded on exam.  She will be diuresed aggressively.  Cardiology is consulted and may consider right heart catheterization however this may be also deferred to outpatient.  We will optimize volume status is much as possible.   Oxygen saturation and requirement has worsened to a maximum of 12 L.  Concern for worsening pulmonary hypertension.  Splinting is contributing.  No evidence of infection.  Patient appears to be responding to diuresis.   12/3: Patient seems to be slowly improving.  Diuresing effectively.  Remains dependent on 7 L nasal cannula   12/5: Unclear whether pulse oximetry is accurate.  Patient appears to be diuresing and lung sounds improving however oxygenation remains dependent on 7 L.  12/6:  Diuresis appears to been effective.  Patient is ambulated and remained on room air during ambulatory desaturation test.  Discussed with pulmonology.  Okay for discharge from their standpoint.  Will adjust home diuretic and antihypertensive regimen to hopefully maintain good oxygenation.  Patient will be scheduled for right heart catheterization as outpatient with [facility] cardiology.  I have notified Dr. Clayborn Bigness and Dr. Corky Sox of patient's discharge.    Discharge Diagnoses:  Principal Problem:   Acute on chronic respiratory failure with hypoxia (HCC) Active Problems:   COPD (chronic obstructive pulmonary disease) (HCC)   Generalized weakness   Obesity, Class III, BMI 40-49.9 (morbid obesity) ([place])   Aneurysm of thoracic aorta   Hypothyroidism   Pulmonary hypertension (HCC)   Chronic kidney disease, stage 3a (HCC)   Multiple fractures of ribs, left side, initial encounter for closed fracture   Cor pulmonale (HCC)  Acute on chronic hypoxic respiratory failure Suspected pulmonary hypertension Multifactorial secondary to splinting from rib fractures Suspected contribution from underlying COPD/OSA Dilated RV on echo indicating pulmonary hypertension Baseline requirement 4 L Cardiology on consult Pulmonology involved in care 12/1 Net negative 570 cc on 12/2 Urine output recording is not accurate Unclear whether pulse oximetry is accurate Weaned off supplemental oxygen after pulse ox placed on patient's ear Plan: Can discharge home.  Decrease dose of Lasix to 40 mg once daily.  Aldactone 25 mg once daily.  Concomitant adjustment and remainder of antihypertensive regimen.  No oxygen indicated at time of discharge as patient did not desaturate on ambulatory desat test.  Follow-up outpatient cardiology and pulmonology within 1 to 2 weeks of discharge.   COPD Does not appear acutely exacerbated Patient with decreased breath sounds but no wheezing or cough  Respiratory status did improved.   Weakness and deconditioned state and has persisted but home health has been ordered.  Follow-up outpatient PCP  Discharge Instructions  Discharge Instructions     Diet - low sodium heart healthy   Complete by: As directed    Increase activity slowly   Complete by: As directed    No wound care   Complete by: As directed       Allergies as of 11/26/2021       Reactions   Latex Hives   Tape and bandaids only   Nickel Hives   Pramipexole Other (See Comments)   Caused hallucinations, says he can take name brand.   Prednisone    Makes her "feel crazy"   Topiramate Other (See Comments)   "spaced out"   Citalopram Anxiety   Paroxetine Hcl Anxiety   Sertraline Anxiety        Medication List     STOP taking these medications    hydrochlorothiazide 12.5 MG tablet Commonly known as: HYDRODIURIL   potassium chloride 10 MEQ tablet Commonly known as: KLOR-CON   torsemide 20 MG tablet Commonly known as: DEMADEX       TAKE these medications    acetaminophen 650 MG CR tablet Commonly known as: TYLENOL Take 650 mg by mouth 3 (three) times daily.   albuterol 108 (90 Base) MCG/ACT inhaler Commonly known as: Proventil HFA Inhale 2 puffs into the lungs every 6 (six) hours as needed for wheezing or shortness of breath.   ALPRAZolam 0.25 MG tablet Commonly known as: XANAX Take 1 tablet (0.25 mg total) by mouth 2 (two) times daily as needed for anxiety.   aspirin 81 MG EC tablet Take 1 tablet (81 mg total) by mouth daily.   budesonide-formoterol 160-4.5 MCG/ACT inhaler Commonly known as: SYMBICORT Inhale 2 puffs into the lungs 2 (two) times daily.   calcium carbonate 600 MG Tabs tablet Commonly known as: OS-CAL Take 600 mg by mouth daily with breakfast.   cholecalciferol 1000 units tablet Commonly known as: VITAMIN D Take 2,000 Units by mouth 2 (two) times daily.   DULoxetine 60 MG capsule Commonly known as: CYMBALTA TAKE 1 CAPSULE BY MOUTH  DAILY    fluticasone 50 MCG/ACT nasal spray Commonly known as: FLONASE Place 1 spray into both nostrils daily as needed for allergies.   furosemide 40 MG tablet Commonly known as: Lasix Take 1 tablet (40 mg total) by mouth daily.   glucose blood test strip Commonly known as: OneTouch Verio Use to check blood sugar once a day. E11.9   levothyroxine 200 MCG tablet Commonly known as: SYNTHROID TAKE 1 TABLET BY MOUTH  DAILY BEFORE BREAKFAST   Magnesium 100 MG Tabs Take 100 mg by mouth 2 (two) times daily.   metFORMIN 500 MG 24 hr tablet Commonly known as: GLUCOPHAGE-XR TAKE 1 TABLET BY MOUTH  DAILY WITH BREAKFAST   montelukast 10 MG tablet Commonly known as: SINGULAIR TAKE 1 TABLET BY MOUTH AT  BEDTIME   OneTouch Delica Lancets Fine Misc 1 each by Does not apply route daily. Use to check blood sugar once a day. E11.9   propranolol 20 MG tablet Commonly known as: INDERAL Take 1 tablet (20 mg total) by mouth 2 (two) times daily. What changed:  medication strength how much to take   rOPINIRole 3 MG tablet Commonly known as: REQUIP TAKE ONE TABLET BY MOUTH AT BEDTIME   silver sulfADIAZINE 1 % cream Commonly known as: SILVADENE Apply 1 application topically  daily.   spironolactone 25 MG tablet Commonly known as: ALDACTONE Take 1 tablet (25 mg total) by mouth daily.   Turmeric Curcumin 500 MG Caps Take 500 mg by mouth at bedtime.   vitamin C 500 MG tablet Commonly known as: ASCORBIC ACID Take 500 mg by mouth daily.   Vitamin E 45 MG (100 UNIT) Caps Take 1 capsule by mouth daily.        Follow-up Information     Venia Carbon, MD. Schedule an appointment as soon as possible for a visit in 1 week(s).   Specialties: Internal Medicine, Pediatrics Contact information: [place] [address] [phone]         Ottie Glazier, MD. Schedule an appointment as soon as possible for a visit in 2 week(s).   Specialty: Pulmonary  Disease Contact information: [place] [address] [phone]         Andrez Grime, MD. Schedule an appointment as soon as possible for a visit.   Specialty: Cardiology Contact information: [address] [place] [address] [phone]                Allergies  Allergen Reactions   Latex Hives    Tape and bandaids only   Nickel Hives   Pramipexole Other (See Comments)    Caused hallucinations, says he can take name brand.   Prednisone     Makes her "feel crazy"   Topiramate Other (See Comments)    "spaced out"   Citalopram Anxiety   Paroxetine Hcl Anxiety   Sertraline Anxiety    Consultations: Cardiology-[facility] Pulmonology   Procedures/Studies: DG Chest 2 View  Result Date: 11/19/2021 CLINICAL DATA:  Shortness of breath EXAM: CHEST - 2 VIEW COMPARISON:  Radiograph 10/23/2018, chest CT 09/26/2021 FINDINGS: Unchanged cardiomediastinal silhouette. Prominent pulmonary arteries. Bibasilar airspace opacities. Reverse right shoulder arthroplasty. No acute osseous abnormality. Thoracic spondylosis. IMPRESSION: Bibasilar airspace opacities which could be atelectasis or developing infection. Electronically Signed   By: Maurine Simmering M.D.   On: 11/19/2021 15:13   CT Angio Chest PE W and/or Wo Contrast  Result Date: 11/19/2021 CLINICAL DATA:  Status post fall. EXAM: CT ANGIOGRAPHY CHEST WITH CONTRAST TECHNIQUE: Multidetector CT imaging of the chest was performed using the standard protocol during bolus administration of intravenous contrast. Multiplanar CT image reconstructions and MIPs were obtained to evaluate the vascular anatomy. CONTRAST:  141mL OMNIPAQUE IOHEXOL 350 MG/ML SOLN COMPARISON:  September 26, 2021 FINDINGS: Cardiovascular: Stable 4.2 cm diameter aneurysmal dilatation of the ascending thoracic aorta is seen. Stable pulmonary artery enlargement is seen. Satisfactory opacification of the pulmonary arteries to the  segmental level. No evidence of pulmonary embolism. There is mild cardiomegaly. No pericardial effusion. Mediastinum/Nodes: Mild AP window 1 mild pretracheal lymphadenopathy is seen. Thyroid gland, trachea, and esophagus demonstrate no significant findings. Lungs/Pleura: Mild linear atelectasis is seen within the posterolateral aspect of the right apex, anteromedial aspect of the left upper lobe and posterior aspects of the bilateral lung bases. There is no evidence of acute infiltrate, pleural effusion or pneumothorax. Upper Abdomen: No acute abnormality. Musculoskeletal: Acute, anterolateral seventh and eighth left rib fractures are seen. Acute, displaced posterior ninth and tenth left rib fractures are also noted. A right shoulder replacement is noted. Multilevel degenerative changes are seen throughout the thoracic spine. Review of the MIP images confirms the above findings. IMPRESSION: 1. Acute, seventh, eighth, ninth and tenth left rib fractures, as described above. 2. Stable 4.2  cm diameter aneurysmal dilatation of the ascending thoracic aorta. 3. Stable pulmonary artery enlargement, which may represent pulmonary arterial hypertension. Electronically Signed   By: Virgina Norfolk M.D.   On: 11/19/2021 23:02   DG Chest Port 1 View  Result Date: 11/21/2021 CLINICAL DATA:  Shortness of breath, hypoxia EXAM: PORTABLE CHEST 1 VIEW COMPARISON:  Chest radiograph 11/19/2021, CTA chest 11/19/2021 FINDINGS: The heart is enlarged, unchanged. There are patchy opacities in perihilar regions and right lung base. The left costophrenic angle is not well delineated, likely due to cardiomegaly and adjacent fat as opposed to a pleural effusion. There is no right pleural effusion. There is no pneumothorax. Right shoulder arthroplasty hardware is again noted the left-sided rib fracture seen on the prior CTA chest are not well seen on the current study. IMPRESSION: 1. Patchy opacities in the perihilar regions and right lung  base may reflect atelectasis; however, developing infection could have a similar appearance. 2. Cardiomegaly. Electronically Signed   By: Valetta Mole M.D.   On: 11/21/2021 10:41   ECHOCARDIOGRAM COMPLETE  Result Date: 11/20/2021    ECHOCARDIOGRAM REPORT   Patient Name:   Kari Medina Date of Exam: 11/20/2021 Medical Rec #:  [id number]             Height:       64.0 in Accession #:    [id number]            Weight:       240.0 lb Date of Birth:  01-12-1946              BSA:          2.114 m Patient Age:    42 years              BP:           Not listed in chart/Not                                                     listed in chart mmHg Patient Gender: F                     HR:           Not listed in chart bpm. Exam Location:  [facility] Procedure: 2D Echo, Color Doppler and Cardiac Doppler Indications:     Pulmonary hypertension I27.2  History:         Patient has no prior history of Echocardiogram examinations.                  COPD; Risk Factors:Diabetes and Hypertension.  Sonographer:     Sherrie Sport Referring Phys:  [id number] Kathrin Ruddy ORGEL Diagnosing Phys: Donnelly Angelica  Sonographer Comments: Technically difficult study due to poor echo windows, no parasternal window and no apical window. Image acquisition challenging due to COPD and Obtainable view was a modified low PLAX/ Apical. IMPRESSIONS  1. Left ventricular ejection fraction, by estimation, is >75%. The left ventricle has hyperdynamic function. The left ventricle has no regional wall motion abnormalities. Left ventricular diastolic function could not be evaluated. There is the interventricular septum is flattened in diastole ('D' shaped left ventricle), consistent with right ventricular volume overload.  2. Unable to estimate pulmonary pressures due to technical difficulties. Right ventricular systolic function is moderately reduced. The  right ventricular size is severely enlarged.  3. The mitral valve is degenerative. No evidence of mitral valve  regurgitation. Moderate mitral annular calcification.  4. The aortic valve is calcified.  5. Aortic dilatation noted. There is mild dilatation of the ascending aorta, measuring 42 mm. Conclusion(s)/Recommendation(s): Findings consistent with pulmonary HTN, but evaluation limited by technical difficulties. FINDINGS  Left Ventricle: Left ventricular ejection fraction, by estimation, is >75%. The left ventricle has hyperdynamic function. The left ventricle has no regional wall motion abnormalities. The left ventricular internal cavity size was small. The interventricular septum is flattened in diastole ('D' shaped left ventricle), consistent with right ventricular volume overload. Left ventricular diastolic function could not be evaluated. Right Ventricle: Unable to estimate pulmonary pressures due to technical difficulties. The right ventricular size is severely enlarged. Right vetricular wall thickness was not assessed. Right ventricular systolic function is moderately reduced. Pericardium: There is no evidence of pericardial effusion. Mitral Valve: The mitral valve is degenerative in appearance. Moderate mitral annular calcification. No evidence of mitral valve regurgitation. Tricuspid Valve: The tricuspid valve is normal in structure. Aortic Valve: The aortic valve is calcified. Aorta: Aortic dilatation noted. There is mild dilatation of the ascending aorta, measuring 42 mm.  LEFT VENTRICLE PLAX 2D LVIDd:         2.90 cm LVIDs:         1.90 cm LV PW:         1.50 cm LV IVS:        1.00 cm  LEFT ATRIUM         Index LA diam:    3.80 cm 1.80 cm/m                        PULMONIC VALVE AORTA                 RVOT Peak grad: 2 mmHg Ao Root diam: 4.00 cm   SHUNTS Pulmonic VTI: 0.117 m Donnelly Angelica Electronically signed by Donnelly Angelica Signature Date/Time: 11/20/2021/5:42:36 PM    Final       Subjective: Seen and examined at the time of discharge.  Stable no distress.  Weaned off supplemental oxygen.  Stable for  discharge home  Discharge Exam: Vitals:   11/26/21 0751 11/26/21 1152  BP: 130/77 (!) 141/88  Pulse: 84 86  Resp: (!) 21 20  Temp: (!) 97.5 F (36.4 C) (!) 97.5 F (36.4 C)  SpO2: (!) 88% (!) 80%   Vitals:   11/26/21 0022 11/26/21 0400 11/26/21 0751 11/26/21 1152  BP: 121/73 126/78 130/77 (!) 141/88  Pulse: 81 81 84 86  Resp: 20 20 (!) 21 20  Temp: 98.2 F (36.8 C) 97.9 F (36.6 C) (!) 97.5 F (36.4 C) (!) 97.5 F (36.4 C)  TempSrc:  Oral Oral Oral  SpO2: 91% 94% (!) 88% (!) 80%  Weight:      Height:        General: Pt is alert, awake, not in acute distress Cardiovascular: RRR, S1/S2 +, no rubs, no gallops Respiratory: Lungs clear.  Diminished lung sounds at bases.  No wheeze.  Normal work of breathing.  Room air Abdominal: Soft, NT, ND, bowel sounds + Extremities: no edema, no cyanosis    The results of significant diagnostics from this hospitalization (including imaging, microbiology, ancillary and laboratory) are listed below for reference.     Microbiology: Recent Results (from the past 240 hour(s))  Respiratory (~20 pathogens) panel by PCR  Status: None   Collection Time: 11/20/21  1:03 AM   Specimen: Nasopharyngeal Swab; Respiratory  Result Value Ref Range Status   Adenovirus NOT DETECTED NOT DETECTED Final   Coronavirus 229E NOT DETECTED NOT DETECTED Final    Comment: (NOTE) The Coronavirus on the Respiratory Panel, DOES NOT test for the novel  Coronavirus (2019 nCoV)    Coronavirus HKU1 NOT DETECTED NOT DETECTED Final   Coronavirus NL63 NOT DETECTED NOT DETECTED Final   Coronavirus OC43 NOT DETECTED NOT DETECTED Final   Metapneumovirus NOT DETECTED NOT DETECTED Final   Rhinovirus / Enterovirus NOT DETECTED NOT DETECTED Final   Influenza A NOT DETECTED NOT DETECTED Final   Influenza B NOT DETECTED NOT DETECTED Final   Parainfluenza Virus 1 NOT DETECTED NOT DETECTED Final   Parainfluenza Virus 2 NOT DETECTED NOT DETECTED Final   Parainfluenza  Virus 3 NOT DETECTED NOT DETECTED Final   Parainfluenza Virus 4 NOT DETECTED NOT DETECTED Final   Respiratory Syncytial Virus NOT DETECTED NOT DETECTED Final   Bordetella pertussis NOT DETECTED NOT DETECTED Final   Bordetella Parapertussis NOT DETECTED NOT DETECTED Final   Chlamydophila pneumoniae NOT DETECTED NOT DETECTED Final   Mycoplasma pneumoniae NOT DETECTED NOT DETECTED Final    Comment: Performed at [facility], [place]. [address] [place], [place] [address]  Resp Panel by RT-PCR (Flu A&B, Covid) Nasopharyngeal Swab     Status: None   Collection Time: 11/20/21  3:59 PM   Specimen: Nasopharyngeal Swab; Nasopharyngeal(NP) swabs in vial transport medium  Result Value Ref Range Status   SARS Coronavirus 2 by RT PCR NEGATIVE NEGATIVE Final    Comment: (NOTE) SARS-CoV-2 target nucleic acids are NOT DETECTED.  The SARS-CoV-2 RNA is generally detectable in upper respiratory specimens during the acute phase of infection. The lowest concentration of SARS-CoV-2 viral copies this assay can detect is 138 copies/mL. A negative result does not preclude SARS-Cov-2 infection and should not be used as the sole basis for treatment or other patient management decisions. A negative result may occur with  improper specimen collection/handling, submission of specimen other than nasopharyngeal swab, presence of viral mutation(s) within the areas targeted by this assay, and inadequate number of viral copies(<138 copies/mL). A negative result must be combined with clinical observations, patient history, and epidemiological information. The expected result is Negative.  Fact Sheet for Patients:  EntrepreneurPulse.com.au  Fact Sheet for Healthcare Providers:  IncredibleEmployment.be  This test is no t yet approved or cleared by the [place] FDA and  has been authorized for detection and/or diagnosis of SARS-CoV-2 by FDA under an Emergency Use Authorization  (EUA). This EUA will remain  in effect (meaning this test can be used) for the duration of the COVID-19 declaration under Section 564(b)(1) of the Act, 21 U.S.C.section 360bbb-3(b)(1), unless the authorization is terminated  or revoked sooner.       Influenza A by PCR NEGATIVE NEGATIVE Final   Influenza B by PCR NEGATIVE NEGATIVE Final    Comment: (NOTE) The Xpert Xpress SARS-CoV-2/FLU/RSV plus assay is intended as an aid in the diagnosis of influenza from Nasopharyngeal swab specimens and should not be used as a sole basis for treatment. Nasal washings and aspirates are unacceptable for Xpert Xpress SARS-CoV-2/FLU/RSV testing.  Fact Sheet for Patients: EntrepreneurPulse.com.au  Fact Sheet for Healthcare Providers: IncredibleEmployment.be  This test is not yet approved or cleared by the [place] FDA and has been authorized for detection and/or diagnosis of SARS-CoV-2 by FDA under an Emergency  Use Authorization (EUA). This EUA will remain in effect (meaning this test can be used) for the duration of the COVID-19 declaration under Section 564(b)(1) of the Act, 21 U.S.C. section 360bbb-3(b)(1), unless the authorization is terminated or revoked.  Performed at [facility], [address] [place], [place] [address]      Labs: BNP (last 3 results) Recent Labs    11/19/21 1432 11/21/21 0512  BNP 1,132.6* 579.0*   Basic Metabolic Panel: Recent Labs  Lab 11/22/21 0339 11/23/21 0541 11/24/21 0524 11/25/21 0414 11/26/21 0408  NA 138 138 139 138 137  K 3.4* 3.3* 3.9 4.0 3.7  CL 100 99 100 101 100  CO2 29 28 31 30 28   GLUCOSE 122* 105* 111* 88 105*  BUN 23 28* 26* 27* 26*  CREATININE 1.13* 0.94 0.98 1.04* 1.02*  CALCIUM 8.4* 8.7* 8.5* 8.6* 8.7*  MG 2.5* 2.6* 2.3 2.4 2.4  PHOS  --   --  3.7  --   --    Liver Function Tests: Recent Labs  Lab 11/19/21 1432  AST 32  ALT 19  ALKPHOS 75  BILITOT 1.6*  PROT 6.9   ALBUMIN 3.5   No results for input(s): LIPASE, AMYLASE in the last 168 hours. No results for input(s): AMMONIA in the last 168 hours. CBC: Recent Labs  Lab 11/19/21 1432 11/20/21 0103  WBC 11.9* 12.1*  HGB 16.1* 15.4*  HCT 48.0* 46.8*  MCV 93.8 95.5  PLT 172 162   Cardiac Enzymes: No results for input(s): CKTOTAL, CKMB, CKMBINDEX, TROPONINI in the last 168 hours. BNP: Invalid input(s): POCBNP CBG: Recent Labs  Lab 11/25/21 0749 11/25/21 1112 11/25/21 1706 11/25/21 2012 11/26/21 0814  GLUCAP 85 153* 105* 137* 79   D-Dimer No results for input(s): DDIMER in the last 72 hours. Hgb A1c No results for input(s): HGBA1C in the last 72 hours. Lipid Profile No results for input(s): CHOL, HDL, LDLCALC, TRIG, CHOLHDL, LDLDIRECT in the last 72 hours. Thyroid function studies No results for input(s): TSH, T4TOTAL, T3FREE, THYROIDAB in the last 72 hours.  Invalid input(s): FREET3 Anemia work up No results for input(s): VITAMINB12, FOLATE, FERRITIN, TIBC, IRON, RETICCTPCT in the last 72 hours. Urinalysis    Component Value Date/Time   COLORURINE AMBER (A) 10/23/2018 1425   APPEARANCEUR CLOUDY (A) 10/23/2018 1425   LABSPEC 1.014 10/23/2018 1425   PHURINE 5.0 10/23/2018 1425   GLUCOSEU NEGATIVE 10/23/2018 1425   HGBUR MODERATE (A) 10/23/2018 1425   BILIRUBINUR Neg 12/27/2018 1522   KETONESUR 20 (A) 10/23/2018 1425   PROTEINUR Negative 12/27/2018 1522   PROTEINUR 100 (A) 10/23/2018 1425   UROBILINOGEN 0.2 12/27/2018 1522   UROBILINOGEN 0.2 11/08/2010 1253   NITRITE Neg 12/27/2018 1522   NITRITE NEGATIVE 10/23/2018 1425   LEUKOCYTESUR Negative 12/27/2018 1522   Sepsis Labs Invalid input(s): PROCALCITONIN,  WBC,  LACTICIDVEN Microbiology Recent Results (from the past 240 hour(s))  Respiratory (~20 pathogens) panel by PCR     Status: None   Collection Time: 11/20/21  1:03 AM   Specimen: Nasopharyngeal Swab; Respiratory  Result Value Ref Range Status   Adenovirus NOT  DETECTED NOT DETECTED Final   Coronavirus 229E NOT DETECTED NOT DETECTED Final    Comment: (NOTE) The Coronavirus on the Respiratory Panel, DOES NOT test for the novel  Coronavirus (2019 nCoV)    Coronavirus HKU1 NOT DETECTED NOT DETECTED Final   Coronavirus NL63 NOT DETECTED NOT DETECTED Final   Coronavirus OC43 NOT DETECTED NOT DETECTED Final  Metapneumovirus NOT DETECTED NOT DETECTED Final   Rhinovirus / Enterovirus NOT DETECTED NOT DETECTED Final   Influenza A NOT DETECTED NOT DETECTED Final   Influenza B NOT DETECTED NOT DETECTED Final   Parainfluenza Virus 1 NOT DETECTED NOT DETECTED Final   Parainfluenza Virus 2 NOT DETECTED NOT DETECTED Final   Parainfluenza Virus 3 NOT DETECTED NOT DETECTED Final   Parainfluenza Virus 4 NOT DETECTED NOT DETECTED Final   Respiratory Syncytial Virus NOT DETECTED NOT DETECTED Final   Bordetella pertussis NOT DETECTED NOT DETECTED Final   Bordetella Parapertussis NOT DETECTED NOT DETECTED Final   Chlamydophila pneumoniae NOT DETECTED NOT DETECTED Final   Mycoplasma pneumoniae NOT DETECTED NOT DETECTED Final    Comment: Performed at [facility], [place] [address] [place], [place] [address]  Resp Panel by RT-PCR (Flu A&B, Covid) Nasopharyngeal Swab     Status: None   Collection Time: 11/20/21  3:59 PM   Specimen: Nasopharyngeal Swab; Nasopharyngeal(NP) swabs in vial transport medium  Result Value Ref Range Status   SARS Coronavirus 2 by RT PCR NEGATIVE NEGATIVE Final    Comment: (NOTE) SARS-CoV-2 target nucleic acids are NOT DETECTED.  The SARS-CoV-2 RNA is generally detectable in upper respiratory specimens during the acute phase of infection. The lowest concentration of SARS-CoV-2 viral copies this assay can detect is 138 copies/mL. A negative result does not preclude SARS-Cov-2 infection and should not be used as the sole basis for treatment or other patient management decisions. A negative result may occur with  improper specimen  collection/handling, submission of specimen other than nasopharyngeal swab, presence of viral mutation(s) within the areas targeted by this assay, and inadequate number of viral copies(<138 copies/mL). A negative result must be combined with clinical observations, patient history, and epidemiological information. The expected result is Negative.  Fact Sheet for Patients:  EntrepreneurPulse.com.au  Fact Sheet for Healthcare Providers:  IncredibleEmployment.be  This test is no t yet approved or cleared by the [place] FDA and  has been authorized for detection and/or diagnosis of SARS-CoV-2 by FDA under an Emergency Use Authorization (EUA). This EUA will remain  in effect (meaning this test can be used) for the duration of the COVID-19 declaration under Section 564(b)(1) of the Act, 21 U.S.C.section 360bbb-3(b)(1), unless the authorization is terminated  or revoked sooner.       Influenza A by PCR NEGATIVE NEGATIVE Final   Influenza B by PCR NEGATIVE NEGATIVE Final    Comment: (NOTE) The Xpert Xpress SARS-CoV-2/FLU/RSV plus assay is intended as an aid in the diagnosis of influenza from Nasopharyngeal swab specimens and should not be used as a sole basis for treatment. Nasal washings and aspirates are unacceptable for Xpert Xpress SARS-CoV-2/FLU/RSV testing.  Fact Sheet for Patients: EntrepreneurPulse.com.au  Fact Sheet for Healthcare Providers: IncredibleEmployment.be  This test is not yet approved or cleared by the [place] FDA and has been authorized for detection and/or diagnosis of SARS-CoV-2 by FDA under an Emergency Use Authorization (EUA). This EUA will remain in effect (meaning this test can be used) for the duration of the COVID-19 declaration under Section 564(b)(1) of the Act, 21 U.S.C. section 360bbb-3(b)(1), unless the authorization is terminated or revoked.  Performed at [facility], [address], [place], [place] [address]      Time coordinating discharge: Over 30 minutes  SIGNED:   Sidney Ace, MD  [facility] 11/26/2021, 11:57 AM Pager   If 7PM-7AM, please contact night-coverage

## 2021-11-26 NOTE — Progress Notes (Signed)
SATURATION QUALIFICATIONS: (This note is used to comply with regulatory documentation for home oxygen)  Patient Saturations on Room Air at Rest = 95%  Patient Saturations on Room Air while Ambulating = 92%  Patient Saturations on 2 Liters of oxygen while Ambulating = 96%  Please briefly explain why patient needs home oxygen:

## 2021-11-26 NOTE — TOC Progression Note (Signed)
Transition of Care [facility]) - Progression Note    Patient Details  Name: Kierria Feigenbaum MRN: [id number] Date of Birth: [birth year]  Transition of Care [facility]) CM/SW [place], RN Phone Number: 11/26/2021, 5:09 PM  Clinical Narrative:   Patient is discharged with spouse, who can assist her if needed.  Patient is a client of [facility], confirmed by Corene Cornea at agency.  Patient states she has walker and DME at home already.  She was established with home oxygen and CPAP with [facility] prior to admission.  Caryl Pina at [facility] aware, will deliver extra tubing tomorrow per patient request.  Patient is amenable to discharge plan to go home, no concerns.    Expected Discharge Plan: [place] Barriers to Discharge: Barriers Resolved  Expected Discharge Plan and Services Expected Discharge Plan: [place] In-house Referral: Clinical Social Work   Post Acute Care Choice: IP Rehab Living arrangements for the past 2 months: [place] Expected Discharge Date: 11/26/21                         HH Arranged: PT [place]: [place] ([place]) Date [place]: 11/23/21 Time [place]: [place] Representative spoke with at [place]: [place] (SDOH) Interventions    Readmission Risk Interventions No flowsheet data found.

## 2021-11-26 NOTE — Progress Notes (Deleted)
SATURATION QUALIFICATIONS: (This note is used to comply with regulatory documentation for home oxygen)  Patient Saturations on Room Air at Rest = 95%  Patient Saturations on Room Air while Ambulating = 9297%  Patient Saturations on 2 Liters of oxygen while Ambulating = 96%  Please briefly explain why patient needs home oxygen: NA

## 2021-11-26 NOTE — Progress Notes (Signed)
PIV removed. Discharge instructions completed. Patient and husband verbalized understanding of medication regimen, follow up appointments and discharge instructions. Patient belongings gathered and packed to discharge.  Wheeled to DC area in wheelchair via [employer] and discharged to care of husband

## 2021-11-26 NOTE — Progress Notes (Signed)
Pharmacy Electrolyte Monitoring Consult:  Pharmacy consulted to assist in monitoring and replacing electrolytes in this 75 y.o. female admitted on 11/19/2021 with Fall and Shortness of Breath -diuresis  Labs:  Sodium (mmol/L)  Date Value  11/26/2021 137   Potassium (mmol/L)  Date Value  11/26/2021 3.7   Magnesium (mg/dL)  Date Value  11/26/2021 2.4   Phosphorus (mg/dL)  Date Value  11/24/2021 3.7   Calcium (mg/dL)  Date Value  11/26/2021 8.7 (L)   Albumin (g/dL)  Date Value  11/19/2021 3.5    Assessment/Plan: K 3.7  Mag 2.4  Scr 1.02 -currently ordered lasix 40mg  IV q12h, spironolactone -will order KCL 40 meq po x 1 dose -f/u electrolytes w. Am labs  Chinita Greenland PharmD Clinical Pharmacist 11/26/2021

## 2021-11-26 NOTE — Care Management Important Message (Signed)
Important Message  Patient Details  Name: [name] MRN: [id number] Date of Birth: 01/27/1946   Medicare Important Message Given:  Yes     Juliann Pulse A Vicke Plotner 11/26/2021, 12:46 PM

## 2021-11-27 ENCOUNTER — Telehealth: Payer: Self-pay

## 2021-11-27 NOTE — Telephone Encounter (Signed)
Transition Care Management Follow-up Telephone Call Date of discharge and from where: 11/26/21 from [facility] How have you been since you were released from the hospital? Patient states feeling better since discharge.  Any questions or concerns? No  Items Reviewed: Did the pt receive and understand the discharge instructions provided? Yes  Medications obtained and verified? Yes  Other? No  Any new allergies since your discharge? No  Dietary orders reviewed? Yes Do you have support at home? Yes   Home Care and Equipment/Supplies: Were home health services ordered? yes If so, what is the name of the agency? [facility]  Has the agency set up a time to come to the patient's home? yes Were any new equipment or medical supplies ordered?  No What is the name of the medical supply agency? N/A Were you able to get the supplies/equipment? not applicable Do you have any questions related to the use of the equipment or supplies? No  Functional Questionnaire: (I = Independent and D = Dependent) ADLs: I with assistance  Bathing/Dressing- I with assistance  Meal Prep- I with assistance  Eating- I  Maintaining continence- I  Transferring/Ambulation- I with assistance of walker  Managing Meds- I with assistance  Follow up appointments reviewed:  PCP Hospital f/u appt confirmed? Patient will call to schedule appointment with PCP Specialist Hospital f/u appt confirmed? No  , appointment scheduled Are transportation arrangements needed? No  If their condition worsens, is the pt aware to call PCP or go to the Emergency Dept.? Yes Was the patient provided with contact information for the PCP's office or ED? Yes Was to pt encouraged to call back with questions or concerns? Yes

## 2021-11-28 ENCOUNTER — Encounter: Admission: RE | Payer: Self-pay | Source: Home / Self Care

## 2021-11-28 ENCOUNTER — Ambulatory Visit: Admission: RE | Admit: 2021-11-28 | Payer: Medicare Other | Source: Home / Self Care | Admitting: Cardiology

## 2021-11-28 DIAGNOSIS — I272 Pulmonary hypertension, unspecified: Secondary | ICD-10-CM

## 2021-11-28 SURGERY — RIGHT HEART CATH
Anesthesia: Moderate Sedation

## 2021-12-03 DIAGNOSIS — J9811 Atelectasis: Secondary | ICD-10-CM | POA: Diagnosis not present

## 2021-12-03 DIAGNOSIS — I130 Hypertensive heart and chronic kidney disease with heart failure and stage 1 through stage 4 chronic kidney disease, or unspecified chronic kidney disease: Secondary | ICD-10-CM | POA: Diagnosis not present

## 2021-12-03 DIAGNOSIS — I712 Thoracic aortic aneurysm, without rupture, unspecified: Secondary | ICD-10-CM | POA: Diagnosis not present

## 2021-12-03 DIAGNOSIS — M159 Polyosteoarthritis, unspecified: Secondary | ICD-10-CM | POA: Diagnosis not present

## 2021-12-03 DIAGNOSIS — G8929 Other chronic pain: Secondary | ICD-10-CM | POA: Diagnosis not present

## 2021-12-03 DIAGNOSIS — E039 Hypothyroidism, unspecified: Secondary | ICD-10-CM | POA: Diagnosis not present

## 2021-12-03 DIAGNOSIS — G2581 Restless legs syndrome: Secondary | ICD-10-CM | POA: Diagnosis not present

## 2021-12-03 DIAGNOSIS — W19XXXD Unspecified fall, subsequent encounter: Secondary | ICD-10-CM | POA: Diagnosis not present

## 2021-12-03 DIAGNOSIS — F329 Major depressive disorder, single episode, unspecified: Secondary | ICD-10-CM | POA: Diagnosis not present

## 2021-12-03 DIAGNOSIS — G4733 Obstructive sleep apnea (adult) (pediatric): Secondary | ICD-10-CM | POA: Diagnosis not present

## 2021-12-03 DIAGNOSIS — I5033 Acute on chronic diastolic (congestive) heart failure: Secondary | ICD-10-CM | POA: Diagnosis not present

## 2021-12-03 DIAGNOSIS — F410 Panic disorder [episodic paroxysmal anxiety] without agoraphobia: Secondary | ICD-10-CM | POA: Diagnosis not present

## 2021-12-03 DIAGNOSIS — S2242XD Multiple fractures of ribs, left side, subsequent encounter for fracture with routine healing: Secondary | ICD-10-CM | POA: Diagnosis not present

## 2021-12-03 DIAGNOSIS — N1831 Chronic kidney disease, stage 3a: Secondary | ICD-10-CM | POA: Diagnosis not present

## 2021-12-03 DIAGNOSIS — K760 Fatty (change of) liver, not elsewhere classified: Secondary | ICD-10-CM | POA: Diagnosis not present

## 2021-12-03 DIAGNOSIS — J449 Chronic obstructive pulmonary disease, unspecified: Secondary | ICD-10-CM | POA: Diagnosis not present

## 2021-12-03 DIAGNOSIS — Z9181 History of falling: Secondary | ICD-10-CM | POA: Diagnosis not present

## 2021-12-03 DIAGNOSIS — I7300 Raynaud's syndrome without gangrene: Secondary | ICD-10-CM | POA: Diagnosis not present

## 2021-12-03 DIAGNOSIS — J9621 Acute and chronic respiratory failure with hypoxia: Secondary | ICD-10-CM | POA: Diagnosis not present

## 2021-12-03 DIAGNOSIS — E1151 Type 2 diabetes mellitus with diabetic peripheral angiopathy without gangrene: Secondary | ICD-10-CM | POA: Diagnosis not present

## 2021-12-03 DIAGNOSIS — M797 Fibromyalgia: Secondary | ICD-10-CM | POA: Diagnosis not present

## 2021-12-03 DIAGNOSIS — E1122 Type 2 diabetes mellitus with diabetic chronic kidney disease: Secondary | ICD-10-CM | POA: Diagnosis not present

## 2021-12-03 DIAGNOSIS — E785 Hyperlipidemia, unspecified: Secondary | ICD-10-CM | POA: Diagnosis not present

## 2021-12-03 DIAGNOSIS — R251 Tremor, unspecified: Secondary | ICD-10-CM | POA: Diagnosis not present

## 2021-12-04 ENCOUNTER — Ambulatory Visit: Payer: Medicare Other | Admitting: Internal Medicine

## 2021-12-05 ENCOUNTER — Ambulatory Visit
Admission: RE | Admit: 2021-12-05 | Payer: Medicare Other | Source: Home / Self Care | Attending: Cardiology | Admitting: Cardiology

## 2021-12-05 ENCOUNTER — Telehealth: Payer: Self-pay | Admitting: Internal Medicine

## 2021-12-05 DIAGNOSIS — I272 Pulmonary hypertension, unspecified: Secondary | ICD-10-CM

## 2021-12-05 SURGERY — RIGHT HEART CATH
Anesthesia: Moderate Sedation

## 2021-12-05 NOTE — Telephone Encounter (Signed)
Home Health verbal orders [place] Name: [place] number: [phone]  Requesting OT/PT/Skilled nursing/Social Work/Speech:  Reason:PT  Frequency:2 x week  4 weeks 1 x week 4 weeks  Please forward to [facility] pool or providers CMA

## 2021-12-05 NOTE — Telephone Encounter (Signed)
Spoke to [place] with Verbal orders. Please call pt and help her r/s the last 2 hospital f/u appointments that have been canceled. We could do virtual if needed. Thanks.

## 2021-12-05 NOTE — Telephone Encounter (Signed)
Pt scheduled a mychart video on 12.21.22

## 2021-12-06 NOTE — Progress Notes (Signed)
Patient did not show up for scheduled procedure. Reached out to contacts and was able to speak with patient when she returned call around 1300. Patient states she was not aware that procedure was rescheduled. I informed her that the MD office will be in touch to reschedule.

## 2021-12-09 ENCOUNTER — Other Ambulatory Visit: Payer: Self-pay | Admitting: Internal Medicine

## 2021-12-10 DIAGNOSIS — I130 Hypertensive heart and chronic kidney disease with heart failure and stage 1 through stage 4 chronic kidney disease, or unspecified chronic kidney disease: Secondary | ICD-10-CM | POA: Diagnosis not present

## 2021-12-10 DIAGNOSIS — E1122 Type 2 diabetes mellitus with diabetic chronic kidney disease: Secondary | ICD-10-CM | POA: Diagnosis not present

## 2021-12-10 DIAGNOSIS — I5033 Acute on chronic diastolic (congestive) heart failure: Secondary | ICD-10-CM | POA: Diagnosis not present

## 2021-12-10 DIAGNOSIS — J449 Chronic obstructive pulmonary disease, unspecified: Secondary | ICD-10-CM | POA: Diagnosis not present

## 2021-12-10 DIAGNOSIS — J9811 Atelectasis: Secondary | ICD-10-CM | POA: Diagnosis not present

## 2021-12-10 DIAGNOSIS — J9621 Acute and chronic respiratory failure with hypoxia: Secondary | ICD-10-CM | POA: Diagnosis not present

## 2021-12-11 ENCOUNTER — Telehealth (INDEPENDENT_AMBULATORY_CARE_PROVIDER_SITE_OTHER): Payer: Medicare Other | Admitting: Internal Medicine

## 2021-12-11 ENCOUNTER — Other Ambulatory Visit: Payer: Self-pay

## 2021-12-11 ENCOUNTER — Encounter: Payer: Self-pay | Admitting: Internal Medicine

## 2021-12-11 DIAGNOSIS — I272 Pulmonary hypertension, unspecified: Secondary | ICD-10-CM | POA: Diagnosis not present

## 2021-12-11 DIAGNOSIS — S2242XA Multiple fractures of ribs, left side, initial encounter for closed fracture: Secondary | ICD-10-CM | POA: Diagnosis not present

## 2021-12-11 DIAGNOSIS — I5032 Chronic diastolic (congestive) heart failure: Secondary | ICD-10-CM | POA: Diagnosis not present

## 2021-12-11 DIAGNOSIS — J9611 Chronic respiratory failure with hypoxia: Secondary | ICD-10-CM | POA: Diagnosis not present

## 2021-12-11 DIAGNOSIS — I5033 Acute on chronic diastolic (congestive) heart failure: Secondary | ICD-10-CM | POA: Insufficient documentation

## 2021-12-11 NOTE — Progress Notes (Signed)
Subjective:    Patient ID: Kari Medina, female    DOB: 05-09-1946, 75 y.o.   MRN: [id number]  HPI Telephone virtual visit for hospital follow up Unable to connect with video Identification done Reviewed limitations and billing and she gave consent Participants---patient and husband (he helps with history) in their home---I am in my office  Fell in shower---trying to get out of bathtub--and has been very weak Has been independent with this Only able to get one foot out---then lost balance [place] with left side on the toilet---able to sit Sat and waited 30 minutes till husband came home---called paramedics  Admitted with SOB, increased oxygen needs Also fluid overload---was given IV diuretics with improvement Cardiology consultation---planning outpt right heart cath  Still with a lot of left chest pain from the fractures--and in the back Not really taking pain meds  Breathing is better Wears oxygen all the time--she hadn't been previously  Not weighing herself Does take furosemide daily  Home health ordered Has PT coming in twice a week Husband helping with shower---has chair  GFR 47  Current Outpatient Medications on File Prior to Visit  Medication Sig Dispense Refill   acetaminophen (TYLENOL) 650 MG CR tablet Take 650 mg by mouth 3 (three) times daily.     albuterol (PROVENTIL HFA) 108 (90 Base) MCG/ACT inhaler Inhale 2 puffs into the lungs every 6 (six) hours as needed for wheezing or shortness of breath. 18 g 2   ALPRAZolam (XANAX) 0.25 MG tablet Take 1 tablet (0.25 mg total) by mouth 2 (two) times daily as needed for anxiety. 30 tablet 0   aspirin EC 81 MG EC tablet Take 1 tablet (81 mg total) by mouth daily. 30 tablet 0   budesonide-formoterol (SYMBICORT) 160-4.5 MCG/ACT inhaler Inhale 2 puffs into the lungs 2 (two) times daily.     calcium carbonate (OS-CAL) 600 MG TABS Take 600 mg by mouth daily with breakfast.     cholecalciferol (VITAMIN D) 1000 units  tablet Take 2,000 Units by mouth 2 (two) times daily.     DULoxetine (CYMBALTA) 60 MG capsule TAKE 1 CAPSULE BY MOUTH  DAILY 90 capsule 3   fluticasone (FLONASE) 50 MCG/ACT nasal spray Place 1 spray into both nostrils daily as needed for allergies.  3   furosemide (LASIX) 40 MG tablet Take 1 tablet (40 mg total) by mouth daily. 30 tablet 1   glucose blood (ONETOUCH VERIO) test strip Use to check blood sugar once a day. E11.9 100 each 3   levothyroxine (SYNTHROID) 200 MCG tablet TAKE 1 TABLET BY MOUTH  DAILY BEFORE BREAKFAST 90 tablet 3   Magnesium 100 MG TABS Take 100 mg by mouth 2 (two) times daily.     metFORMIN (GLUCOPHAGE-XR) 500 MG 24 hr tablet TAKE 1 TABLET BY MOUTH  DAILY WITH BREAKFAST 90 tablet 3   montelukast (SINGULAIR) 10 MG tablet TAKE 1 TABLET BY MOUTH AT  BEDTIME 90 tablet 3   ONETOUCH DELICA LANCETS FINE MISC 1 each by Does not apply route daily. Use to check blood sugar once a day. E11.9 100 each 3   propranolol (INDERAL) 20 MG tablet Take 1 tablet (20 mg total) by mouth 2 (two) times daily. 60 tablet 1   rOPINIRole (REQUIP) 3 MG tablet TAKE ONE TABLET BY MOUTH AT BEDTIME 90 tablet 3   silver sulfADIAZINE (SILVADENE) 1 % cream Apply 1 application topically daily. 50 g 0   spironolactone (ALDACTONE) 25 MG tablet Take 1 tablet (25 mg total)  by mouth daily. 30 tablet 1   Turmeric Curcumin 500 MG CAPS Take 500 mg by mouth at bedtime.      vitamin C (ASCORBIC ACID) 500 MG tablet Take 500 mg by mouth daily.     Vitamin E 45 MG (100 UNIT) CAPS Take 1 capsule by mouth daily.     No current facility-administered medications on file prior to visit.    Allergies  Allergen Reactions   Latex Hives    Tape and bandaids only   Nickel Hives   Pramipexole Other (See Comments)    Caused hallucinations, says he can take name brand.   Prednisone     Makes her "feel crazy"   Topiramate Other (See Comments)    "spaced out"   Citalopram Anxiety   Paroxetine Hcl Anxiety   Sertraline  Anxiety    Past Medical History:  Diagnosis Date   Asthma    Chronic hypoxemic respiratory failure ([name])    uses O2 with exertion and with CPAP   COPD (chronic obstructive pulmonary disease) ([place])    Depression    Diabetes mellitus without complication (HCC)    Dyspnea    doe   Dysrhythmia    extra beat   Fatty liver    Fibromyalgia    Generalized osteoarthritis of multiple sites    History of hiatal hernia    Hypertension    Hypothyroidism    Melanoma ([place]) 07/2018   Right leg   OSA (obstructive sleep apnea)    Pain    chronic ruq and back pain   Panic attacks    PONV (postoperative nausea and vomiting)    after thyroidectomy   Raynaud disease    RLS (restless legs syndrome)    Spleen absent    TOLD ABSENT THEN TOLD DOES HAVE SPLEEN. PATIENT IS UNCERTAIN   Tremor, essential    Wears dentures    full upper and lower    Past Surgical History:  Procedure Laterality Date   CATARACT EXTRACTION W/PHACO Right 03/04/2021   Procedure: CATARACT EXTRACTION PHACO AND INTRAOCULAR LENS PLACEMENT (IOC) RIGHT DIABETIC 3.98 00:33.2;  Surgeon: Eulogio Bear, MD;  Location: [place];  Service: Ophthalmology;  Laterality: Right;  Diabetic - oral meds   CATARACT EXTRACTION W/PHACO Left 03/18/2021   Procedure: CATARACT EXTRACTION PHACO AND INTRAOCULAR LENS PLACEMENT ([place]) LEFT;  Surgeon: Eulogio Bear, MD;  Location: [place];  Service: Ophthalmology;  Laterality: Left;  2.96 0:27.9   CHOLECYSTECTOMY     DILATATION & CURETTAGE/HYSTEROSCOPY WITH MYOSURE N/A 11/10/2018   Procedure: DILATATION & CURETTAGE/HYSTEROSCOPY WITH MYOSURE/MYOMECTOMY;  Surgeon: Aletha Halim, MD;  Location: [place];  Service: Gynecology;  Laterality: N/A;  possible myosure.  Please use myosure scope, do not open myosure blades but have in the room   JOINT REPLACEMENT Bilateral    2008/2011   MELANOMA EXCISION  07/2018   REVERSE SHOULDER ARTHROPLASTY Right 05/28/2017    Procedure: REVERSE SHOULDER ARTHROPLASTY;  Surgeon: Corky Mull, MD;  Location: [facility];  Service: Orthopedics;  Laterality: Right;   RHINOPLASTY  1972   THYROIDECTOMY  2006   TOTAL KNEE ARTHROPLASTY     bilateral    Family History  Problem Relation Age of Onset   Osteoarthritis Mother    Diabetes Mother    Cirrhosis Mother    Cancer Father    Kidney cancer Father    Bladder Cancer Father    Heart disease Brother  stents in 1 brother   Breast cancer Neg Hx     Social History   Socioeconomic History   Marital status: Married    Spouse name: Not on file   Number of children: 1   Years of education: Not on file   Highest education level: Not on file  Occupational History   Occupation: Neurosurgeon    Comment: Retired  Tobacco Use   Smoking status: Former    Packs/day: 0.25    Years: 5.00    Pack years: 1.25    Types: Cigarettes    Quit date: 12/22/1988    Years since quitting: 32.9   Smokeless tobacco: Never  Vaping Use   Vaping Use: Never used  Substance and Sexual Activity   Alcohol use: No   Drug use: No   Sexual activity: Not on file  Other Topics Concern   Not on file  Social History Narrative   1 daughter      Has living will   Husband has health care POA. Alternate would be daughter Judson Roch   Would allow resuscitation but no prolonged machines   Not sure about feeding tubes   Social Determinants of Health   Financial Resource Strain: Not on file  Food Insecurity: Not on file  Transportation Needs: Not on file  Physical Activity: Not on file  Stress: Not on file  Social Connections: Not on file  Intimate Partner Violence: Not on file   Review of Systems Has had trouble sleeping---xanax at night is helping Eating okay--still having frequent nausea     Objective:   Physical Exam Constitutional:      Comments: Voice sounds normal  Pulmonary:     Effort: No respiratory distress.  Neurological:     Mental Status: She is  alert.           Assessment & Plan:

## 2021-12-11 NOTE — Assessment & Plan Note (Signed)
Now wearing the oxygen all the time

## 2021-12-11 NOTE — Assessment & Plan Note (Signed)
Ongoing pain but doing okay without meds Has PT coming to her house---has home exercise regimen Not affecting breathing now  Total time 16 minutes

## 2021-12-11 NOTE — Assessment & Plan Note (Signed)
Did have fluiid overload and improved with diuresis Takes furosemide 40mg  daily Asked her to weight daily to monitor fluid status and check in if it goes up

## 2021-12-11 NOTE — Assessment & Plan Note (Signed)
Waiting for call from cardiology about right heart cath

## 2021-12-12 ENCOUNTER — Other Ambulatory Visit: Payer: Self-pay | Admitting: Internal Medicine

## 2021-12-13 DIAGNOSIS — J9811 Atelectasis: Secondary | ICD-10-CM | POA: Diagnosis not present

## 2021-12-13 DIAGNOSIS — J449 Chronic obstructive pulmonary disease, unspecified: Secondary | ICD-10-CM | POA: Diagnosis not present

## 2021-12-13 DIAGNOSIS — I130 Hypertensive heart and chronic kidney disease with heart failure and stage 1 through stage 4 chronic kidney disease, or unspecified chronic kidney disease: Secondary | ICD-10-CM | POA: Diagnosis not present

## 2021-12-13 DIAGNOSIS — I5033 Acute on chronic diastolic (congestive) heart failure: Secondary | ICD-10-CM | POA: Diagnosis not present

## 2021-12-13 DIAGNOSIS — J9621 Acute and chronic respiratory failure with hypoxia: Secondary | ICD-10-CM | POA: Diagnosis not present

## 2021-12-13 DIAGNOSIS — E1122 Type 2 diabetes mellitus with diabetic chronic kidney disease: Secondary | ICD-10-CM | POA: Diagnosis not present

## 2021-12-18 DIAGNOSIS — J449 Chronic obstructive pulmonary disease, unspecified: Secondary | ICD-10-CM | POA: Diagnosis not present

## 2021-12-18 DIAGNOSIS — E1122 Type 2 diabetes mellitus with diabetic chronic kidney disease: Secondary | ICD-10-CM | POA: Diagnosis not present

## 2021-12-18 DIAGNOSIS — I130 Hypertensive heart and chronic kidney disease with heart failure and stage 1 through stage 4 chronic kidney disease, or unspecified chronic kidney disease: Secondary | ICD-10-CM | POA: Diagnosis not present

## 2021-12-18 DIAGNOSIS — J9621 Acute and chronic respiratory failure with hypoxia: Secondary | ICD-10-CM | POA: Diagnosis not present

## 2021-12-18 DIAGNOSIS — J9811 Atelectasis: Secondary | ICD-10-CM | POA: Diagnosis not present

## 2021-12-18 DIAGNOSIS — I5033 Acute on chronic diastolic (congestive) heart failure: Secondary | ICD-10-CM | POA: Diagnosis not present

## 2021-12-20 DIAGNOSIS — J9811 Atelectasis: Secondary | ICD-10-CM | POA: Diagnosis not present

## 2021-12-20 DIAGNOSIS — J449 Chronic obstructive pulmonary disease, unspecified: Secondary | ICD-10-CM | POA: Diagnosis not present

## 2021-12-20 DIAGNOSIS — E1122 Type 2 diabetes mellitus with diabetic chronic kidney disease: Secondary | ICD-10-CM | POA: Diagnosis not present

## 2021-12-20 DIAGNOSIS — J9621 Acute and chronic respiratory failure with hypoxia: Secondary | ICD-10-CM | POA: Diagnosis not present

## 2021-12-20 DIAGNOSIS — I130 Hypertensive heart and chronic kidney disease with heart failure and stage 1 through stage 4 chronic kidney disease, or unspecified chronic kidney disease: Secondary | ICD-10-CM | POA: Diagnosis not present

## 2021-12-20 DIAGNOSIS — I5033 Acute on chronic diastolic (congestive) heart failure: Secondary | ICD-10-CM | POA: Diagnosis not present

## 2021-12-22 DIAGNOSIS — I890 Lymphedema, not elsewhere classified: Secondary | ICD-10-CM | POA: Insufficient documentation

## 2021-12-24 DIAGNOSIS — I130 Hypertensive heart and chronic kidney disease with heart failure and stage 1 through stage 4 chronic kidney disease, or unspecified chronic kidney disease: Secondary | ICD-10-CM | POA: Diagnosis not present

## 2021-12-24 DIAGNOSIS — J9811 Atelectasis: Secondary | ICD-10-CM | POA: Diagnosis not present

## 2021-12-24 DIAGNOSIS — E1122 Type 2 diabetes mellitus with diabetic chronic kidney disease: Secondary | ICD-10-CM | POA: Diagnosis not present

## 2021-12-24 DIAGNOSIS — J449 Chronic obstructive pulmonary disease, unspecified: Secondary | ICD-10-CM | POA: Diagnosis not present

## 2021-12-24 DIAGNOSIS — J9621 Acute and chronic respiratory failure with hypoxia: Secondary | ICD-10-CM | POA: Diagnosis not present

## 2021-12-24 DIAGNOSIS — I5033 Acute on chronic diastolic (congestive) heart failure: Secondary | ICD-10-CM | POA: Diagnosis not present

## 2021-12-25 ENCOUNTER — Ambulatory Visit
Admission: RE | Admit: 2021-12-25 | Discharge: 2021-12-25 | Disposition: A | Discharge status: Home / Self Care | Payer: Medicare Other | Source: Ambulatory Visit | Attending: Cardiology | Admitting: Cardiology

## 2021-12-25 ENCOUNTER — Other Ambulatory Visit (HOSPITAL_COMMUNITY): Payer: Self-pay | Admitting: Cardiology

## 2021-12-25 ENCOUNTER — Other Ambulatory Visit
Admission: RE | Admit: 2021-12-25 | Discharge: 2021-12-25 | Disposition: A | Discharge status: Home / Self Care | Payer: Medicare Other | Source: Ambulatory Visit | Attending: Cardiology | Admitting: Cardiology

## 2021-12-25 ENCOUNTER — Other Ambulatory Visit: Payer: Self-pay | Admitting: Cardiology

## 2021-12-25 ENCOUNTER — Other Ambulatory Visit: Payer: Self-pay

## 2021-12-25 DIAGNOSIS — R600 Localized edema: Secondary | ICD-10-CM | POA: Insufficient documentation

## 2021-12-25 DIAGNOSIS — J438 Other emphysema: Secondary | ICD-10-CM | POA: Diagnosis not present

## 2021-12-25 DIAGNOSIS — I272 Pulmonary hypertension, unspecified: Secondary | ICD-10-CM | POA: Insufficient documentation

## 2021-12-25 DIAGNOSIS — R0602 Shortness of breath: Secondary | ICD-10-CM | POA: Diagnosis not present

## 2021-12-25 DIAGNOSIS — J9621 Acute and chronic respiratory failure with hypoxia: Secondary | ICD-10-CM | POA: Diagnosis not present

## 2021-12-25 DIAGNOSIS — M79604 Pain in right leg: Secondary | ICD-10-CM | POA: Diagnosis not present

## 2021-12-25 DIAGNOSIS — M79605 Pain in left leg: Secondary | ICD-10-CM | POA: Diagnosis not present

## 2021-12-25 LAB — BRAIN NATRIURETIC PEPTIDE: B Natriuretic Peptide: 960.8 pg/mL — ABNORMAL HIGH (ref 0.0–100.0)

## 2021-12-26 DIAGNOSIS — J9621 Acute and chronic respiratory failure with hypoxia: Secondary | ICD-10-CM | POA: Diagnosis not present

## 2021-12-26 DIAGNOSIS — J449 Chronic obstructive pulmonary disease, unspecified: Secondary | ICD-10-CM | POA: Diagnosis not present

## 2021-12-26 DIAGNOSIS — E1122 Type 2 diabetes mellitus with diabetic chronic kidney disease: Secondary | ICD-10-CM | POA: Diagnosis not present

## 2021-12-26 DIAGNOSIS — J9811 Atelectasis: Secondary | ICD-10-CM | POA: Diagnosis not present

## 2021-12-26 DIAGNOSIS — I130 Hypertensive heart and chronic kidney disease with heart failure and stage 1 through stage 4 chronic kidney disease, or unspecified chronic kidney disease: Secondary | ICD-10-CM | POA: Diagnosis not present

## 2021-12-26 DIAGNOSIS — I5033 Acute on chronic diastolic (congestive) heart failure: Secondary | ICD-10-CM | POA: Diagnosis not present

## 2021-12-27 ENCOUNTER — Ambulatory Visit
Admission: RE | Admit: 2021-12-27 | Discharge: 2021-12-27 | Disposition: A | Discharge status: Home / Self Care | Payer: Medicare Other | Attending: Cardiology | Admitting: Cardiology

## 2021-12-27 ENCOUNTER — Ambulatory Visit: Admit: 2021-12-27 | Payer: Medicare Other | Admitting: Cardiology

## 2021-12-27 ENCOUNTER — Encounter: Payer: Self-pay | Admitting: Cardiology

## 2021-12-27 ENCOUNTER — Other Ambulatory Visit: Payer: Self-pay

## 2021-12-27 ENCOUNTER — Encounter
Admission: RE | Disposition: A | Discharge status: Home / Self Care | Payer: Self-pay | Source: Home / Self Care | Attending: Cardiology

## 2021-12-27 DIAGNOSIS — I272 Pulmonary hypertension, unspecified: Secondary | ICD-10-CM | POA: Diagnosis not present

## 2021-12-27 DIAGNOSIS — G4733 Obstructive sleep apnea (adult) (pediatric): Secondary | ICD-10-CM | POA: Insufficient documentation

## 2021-12-27 DIAGNOSIS — I270 Primary pulmonary hypertension: Secondary | ICD-10-CM | POA: Diagnosis not present

## 2021-12-27 DIAGNOSIS — E119 Type 2 diabetes mellitus without complications: Secondary | ICD-10-CM | POA: Diagnosis not present

## 2021-12-27 DIAGNOSIS — Z9981 Dependence on supplemental oxygen: Secondary | ICD-10-CM | POA: Insufficient documentation

## 2021-12-27 DIAGNOSIS — J449 Chronic obstructive pulmonary disease, unspecified: Secondary | ICD-10-CM | POA: Insufficient documentation

## 2021-12-27 DIAGNOSIS — E785 Hyperlipidemia, unspecified: Secondary | ICD-10-CM | POA: Insufficient documentation

## 2021-12-27 DIAGNOSIS — Z6838 Body mass index (BMI) 38.0-38.9, adult: Secondary | ICD-10-CM | POA: Diagnosis not present

## 2021-12-27 DIAGNOSIS — I10 Essential (primary) hypertension: Secondary | ICD-10-CM | POA: Diagnosis not present

## 2021-12-27 HISTORY — PX: RIGHT HEART CATH: CATH118263

## 2021-12-27 LAB — GLUCOSE, CAPILLARY
Glucose-Capillary: 64 mg/dL — ABNORMAL LOW (ref 70–99)
Glucose-Capillary: 70 mg/dL (ref 70–99)

## 2021-12-27 SURGERY — RIGHT HEART CATH
Anesthesia: Moderate Sedation

## 2021-12-27 SURGERY — RIGHT HEART CATH
Anesthesia: Moderate Sedation | Laterality: Right

## 2021-12-27 MED ORDER — SODIUM CHLORIDE 0.9% FLUSH: 3.0000 mL | Freq: Two times a day (BID) | INTRAVENOUS | Status: DC

## 2021-12-27 MED ORDER — SODIUM CHLORIDE 0.9% FLUSH: 3.0000 mL | INTRAVENOUS | Status: DC | PRN

## 2021-12-27 MED ORDER — SODIUM CHLORIDE 0.9 % IV SOLN: INTRAVENOUS | Status: DC

## 2021-12-27 MED ORDER — DEXTROSE 50 % IV SOLN
INTRAVENOUS | Status: AC
  Administered 2021-12-27: 25 mL via INTRAVENOUS
  Filled 2021-12-27: qty 50

## 2021-12-27 MED ORDER — DEXTROSE 50 % IV SOLN
25.0000 mL | Freq: Once | INTRAVENOUS | Status: AC
  Administered 2021-12-27: 25 mL via INTRAVENOUS

## 2021-12-27 MED ORDER — LIDOCAINE HCL 1 % IJ SOLN
INTRAMUSCULAR | Status: AC
  Filled 2021-12-27: qty 20

## 2021-12-27 MED ORDER — LIDOCAINE HCL (PF) 1 % IJ SOLN
INTRAMUSCULAR | Status: DC | PRN
  Administered 2021-12-27: 2 mL

## 2021-12-27 MED ORDER — SODIUM CHLORIDE 0.9 % IV SOLN: 250.0000 mL | INTRAVENOUS | Status: DC | PRN

## 2021-12-27 MED ORDER — HEPARIN (PORCINE) IN NACL 1000-0.9 UT/500ML-% IV SOLN
INTRAVENOUS | Status: DC | PRN
  Administered 2021-12-27: 500 mL

## 2021-12-27 SURGICAL SUPPLY — 7 items
CATH SWAN GANZ 7F STRAIGHT (CATHETERS) ×1 IMPLANT
DRAPE BRACHIAL (DRAPES) ×1 IMPLANT
KIT MICROPUNCTURE NIT STIFF (SHEATH) ×1 IMPLANT
KIT RIGHT HEART (MISCELLANEOUS) ×1 IMPLANT
PACK CARDIAC CATH (CUSTOM PROCEDURE TRAY) ×3 IMPLANT
SHEATH GLIDE SLENDER 4/5FR (SHEATH) IMPLANT
SHEATH PINNACLE 7F 10CM (SHEATH) ×1 IMPLANT

## 2021-12-27 NOTE — H&P (Signed)
[facility] Cardiology History and Physical  Patient ID: Kari Medina MRN: [id number] DOB/AGE: 1946/07/21 76 y.o. Admit date: 12/27/2021  Primary Care Physician: Venia Carbon, MD Primary Cardiologist Andrez Grime, MD   HPI:  Kari Medina is a 76 year old female with a history of obesity, hypertension, hyperlipidemia, OSA, COPD on 3 L O2, severe pulmonary hypertension who presents today for a right heart catheterization for further evaluation of her pulmonary hypertension.  She was recently admitted to the hospital and received IV diuresis with significant improvement.  She was seen in clinic on Wednesday and was doing relatively well though she did have some increased lower extremity edema.    Past Medical History:  Diagnosis Date   Asthma    Chronic hypoxemic respiratory failure (HCC)    uses O2 with exertion and with CPAP   COPD (chronic obstructive pulmonary disease) (HCC)    Depression    Diabetes mellitus without complication (HCC)    Dyspnea    doe   Dysrhythmia    extra beat   Fatty liver    Fibromyalgia    Generalized osteoarthritis of multiple sites    History of hiatal hernia    Hypertension    Hypothyroidism    Melanoma ([place]) 07/2018   Right leg   OSA (obstructive sleep apnea)    Pain    chronic ruq and back pain   Panic attacks    PONV (postoperative nausea and vomiting)    after thyroidectomy   Raynaud disease    RLS (restless legs syndrome)    Spleen absent    TOLD ABSENT THEN TOLD DOES HAVE SPLEEN. PATIENT IS UNCERTAIN   Tremor, essential    Wears dentures    full upper and lower    Past Surgical History:  Procedure Laterality Date   CATARACT EXTRACTION W/PHACO Right 03/04/2021   Procedure: CATARACT EXTRACTION PHACO AND INTRAOCULAR LENS PLACEMENT (IOC) RIGHT DIABETIC 3.98 00:33.2;  Surgeon: Eulogio Bear, MD;  Location: [place];  Service: Ophthalmology;  Laterality: Right;  Diabetic - oral meds   CATARACT EXTRACTION W/PHACO  Left 03/18/2021   Procedure: CATARACT EXTRACTION PHACO AND INTRAOCULAR LENS PLACEMENT ([place]) LEFT;  Surgeon: Eulogio Bear, MD;  Location: [place];  Service: Ophthalmology;  Laterality: Left;  2.96 0:27.9   CHOLECYSTECTOMY     DILATATION & CURETTAGE/HYSTEROSCOPY WITH MYOSURE N/A 11/10/2018   Procedure: DILATATION & CURETTAGE/HYSTEROSCOPY WITH MYOSURE/MYOMECTOMY;  Surgeon: Aletha Halim, MD;  Location: [place];  Service: Gynecology;  Laterality: N/A;  possible myosure.  Please use myosure scope, do not open myosure blades but have in the room   JOINT REPLACEMENT Bilateral    2008/2011   MELANOMA EXCISION  07/2018   REVERSE SHOULDER ARTHROPLASTY Right 05/28/2017   Procedure: REVERSE SHOULDER ARTHROPLASTY;  Surgeon: Corky Mull, MD;  Location: [facility];  Service: Orthopedics;  Laterality: Right;   RHINOPLASTY  1972   THYROIDECTOMY  2006   TOTAL KNEE ARTHROPLASTY     bilateral    Medications Prior to Admission  Medication Sig Dispense Refill Last Dose   acetaminophen (TYLENOL) 650 MG CR tablet Take 650 mg by mouth every 8 (eight) hours as needed for pain.      albuterol (PROVENTIL HFA) 108 (90 Base) MCG/ACT inhaler Inhale 2 puffs into the lungs every 6 (six) hours as needed for wheezing or shortness of breath. 18 g 2    ALPRAZolam (XANAX) 0.25 MG tablet Take 1 tablet (0.25 mg total) by mouth 2 (  two) times daily as needed for anxiety. 30 tablet 0 12/26/2021   aspirin 325 MG EC tablet Take 162.5 mg by mouth daily.   12/26/2021   budesonide-formoterol (SYMBICORT) 160-4.5 MCG/ACT inhaler Inhale 2 puffs into the lungs 2 (two) times daily.   12/26/2021   calcium carbonate (OS-CAL) 600 MG TABS Take 600 mg by mouth daily with breakfast.      cholecalciferol (VITAMIN D) 1000 units tablet Take 2,000 Units by mouth 2 (two) times daily.   12/26/2021   DULoxetine (CYMBALTA) 60 MG capsule TAKE 1 CAPSULE BY MOUTH  DAILY 90 capsule 3 12/26/2021   furosemide (LASIX) 40 MG tablet Take 1  tablet (40 mg total) by mouth daily. 30 tablet 1 12/26/2021   levothyroxine (SYNTHROID) 200 MCG tablet TAKE 1 TABLET BY MOUTH  DAILY BEFORE BREAKFAST 90 tablet 3 12/26/2021   Magnesium 500 MG TABS Take 500 mg by mouth daily.   12/26/2021   metFORMIN (GLUCOPHAGE-XR) 500 MG 24 hr tablet TAKE 1 TABLET BY MOUTH  DAILY WITH BREAKFAST 90 tablet 3 12/26/2021   montelukast (SINGULAIR) 10 MG tablet TAKE 1 TABLET BY MOUTH AT  BEDTIME 90 tablet 3 12/26/2021   propranolol (INDERAL) 20 MG tablet Take 1 tablet (20 mg total) by mouth 2 (two) times daily. 60 tablet 1 12/26/2021   rOPINIRole (REQUIP) 3 MG tablet TAKE ONE TABLET BY MOUTH AT BEDTIME 90 tablet 3    silver sulfADIAZINE (SILVADENE) 1 % cream Apply 1 application topically daily. 50 g 0 12/26/2021   spironolactone (ALDACTONE) 25 MG tablet Take 1 tablet (25 mg total) by mouth daily. 30 tablet 1 12/26/2021   Turmeric Curcumin 500 MG CAPS Take 500 mg by mouth at bedtime.       vitamin B-12 (CYANOCOBALAMIN) 1000 MCG tablet Take 1,000 mcg by mouth daily.   12/26/2021   vitamin C (ASCORBIC ACID) 500 MG tablet Take 500 mg by mouth daily.   12/26/2021   Vitamin E 45 MG (100 UNIT) CAPS Take 100 Units by mouth daily.   12/26/2021   zinc gluconate 50 MG tablet Take 50 mg by mouth at bedtime.   12/26/2021   fluticasone (FLONASE) 50 MCG/ACT nasal spray Place 1 spray into both nostrils daily as needed for allergies.  3    glucose blood (ONETOUCH VERIO) test strip Use to check blood sugar once a day. E11.9 100 each 3    ONETOUCH DELICA LANCETS FINE MISC 1 each by Does not apply route daily. Use to check blood sugar once a day. E11.9 100 each 3    Social History   Socioeconomic History   Marital status: Married    Spouse name: Not on file   Number of children: 1   Years of education: Not on file   Highest education level: Not on file  Occupational History   Occupation: Neurosurgeon    Comment: Retired  Tobacco Use   Smoking status: Former    Packs/day: 0.25    Years: 5.00     Pack years: 1.25    Types: Cigarettes    Quit date: 12/22/1988    Years since quitting: 33.0   Smokeless tobacco: Never  Vaping Use   Vaping Use: Never used  Substance and Sexual Activity   Alcohol use: No   Drug use: No   Sexual activity: Not on file  Other Topics Concern   Not on file  Social History Narrative   1 daughter      Has living will  Husband has health care POA. Alternate would be daughter Judson Roch   Would allow resuscitation but no prolonged machines   Not sure about feeding tubes   Social Determinants of Health   Financial Resource Strain: Not on file  Food Insecurity: Not on file  Transportation Needs: Not on file  Physical Activity: Not on file  Stress: Not on file  Social Connections: Not on file  Intimate Partner Violence: Not on file    Family History  Problem Relation Age of Onset   Osteoarthritis Mother    Diabetes Mother    Cirrhosis Mother    Cancer Father    Kidney cancer Father    Bladder Cancer Father    Heart disease Brother        stents in 1 brother   Breast cancer Neg Hx       Review of systems complete and found to be negative unless listed above      Physical Exam:  General: Chronically ill-appearing.  No apparent distress. HEENT:  Normocephalic and atramatic Neck:  No JVD.  Lungs: Clear bilaterally to auscultation and percussion.  [place] oxygen in place Heart: HRRR . 2/6 systolic murmur Abdomen: Bowel sounds are positive, abdomen soft and non-tender  Msk:  Back normal, normal gait. Normal strength and tone for age. Extremities: 2+ lower extremity edema. Neuro: Alert and oriented X 3. Psych:  Good affect, responds appropriately   Labs:   Lab Results  Component Value Date   WBC 12.1 (H) 11/20/2021   HGB 15.4 (H) 11/20/2021   HCT 46.8 (H) 11/20/2021   MCV 95.5 11/20/2021   PLT 162 11/20/2021   No results for input(s): NA, K, CL, CO2, BUN, CREATININE, CALCIUM, PROT, BILITOT, ALKPHOS, ALT, AST, GLUCOSE in the last 168  hours.  Invalid input(s): LABALBU No results found for: CKTOTAL, CKMB, CKMBINDEX, TROPONINI  Lab Results  Component Value Date   CHOL 136 10/29/2021   CHOL 162 01/11/2021   CHOL 173 07/06/2020   Lab Results  Component Value Date   HDL 27.90 (L) 10/29/2021   HDL 40.00 01/11/2021   HDL 45.90 07/06/2020   Lab Results  Component Value Date   LDLCALC 78 10/29/2021   LDLCALC 87 01/11/2021   LDLCALC 102 (H) 07/06/2020   Lab Results  Component Value Date   TRIG 151.0 (H) 10/29/2021   TRIG 178.0 (H) 01/11/2021   TRIG 124.0 07/06/2020   Lab Results  Component Value Date   CHOLHDL 5 10/29/2021   CHOLHDL 4 01/11/2021   CHOLHDL 4 07/06/2020   No results found for: LDLDIRECT    Radiology: US Venous Img Lower Bilateral (DVT)  Result Date: 12/25/2021 CLINICAL DATA:  Bilateral lower extremity pain and edema. EXAM: BILATERAL LOWER EXTREMITY VENOUS DOPPLER ULTRASOUND TECHNIQUE: Gray-scale sonography with graded compression, as well as color Doppler and duplex ultrasound were performed to evaluate the lower extremity deep venous systems from the level of the common femoral vein and including the common femoral, femoral, profunda femoral, popliteal and calf veins including the posterior tibial, peroneal and gastrocnemius veins when visible. The superficial great saphenous vein was also interrogated. Spectral Doppler was utilized to evaluate flow at rest and with distal augmentation maneuvers in the common femoral, femoral and popliteal veins. COMPARISON:  None. FINDINGS: RIGHT LOWER EXTREMITY Common Femoral Vein: No evidence of thrombus. Normal compressibility, respiratory phasicity and response to augmentation. Saphenofemoral Junction: No evidence of thrombus. Normal compressibility and flow on color Doppler imaging. Profunda Femoral Vein: No evidence of thrombus. Normal  compressibility and flow on color Doppler imaging. Femoral Vein: No evidence of thrombus. Normal compressibility, respiratory  phasicity and response to augmentation. Popliteal Vein: No evidence of thrombus. Normal compressibility, respiratory phasicity and response to augmentation. Calf Veins: No evidence of thrombus. Normal compressibility and flow on color Doppler imaging. Superficial Great Saphenous Vein: No evidence of thrombus. Normal compressibility. Venous Reflux:  None. Other Findings:  None. LEFT LOWER EXTREMITY Common Femoral Vein: No evidence of thrombus. Normal compressibility, respiratory phasicity and response to augmentation. Saphenofemoral Junction: No evidence of thrombus. Normal compressibility and flow on color Doppler imaging. Profunda Femoral Vein: No evidence of thrombus. Normal compressibility and flow on color Doppler imaging. Femoral Vein: No evidence of thrombus. Normal compressibility, respiratory phasicity and response to augmentation. Popliteal Vein: No evidence of thrombus. Normal compressibility, respiratory phasicity and response to augmentation. Calf Veins: No evidence of thrombus. Normal compressibility and flow on color Doppler imaging. Superficial Great Saphenous Vein: No evidence of thrombus. Normal compressibility. Venous Reflux:  None. Other Findings:  None. IMPRESSION: No evidence of deep venous thrombosis in either lower extremity. Electronically Signed   By: Marijo Conception M.D.   On: 12/25/2021 17:34    EKG: Normal sinus rhythm with a right axis deviation.  Nonspecific IVCD, though with right bundle Lloyd features.  ASSESSMENT AND PLAN:  Kari Medina is a 77 year old female with obesity, OSA, COPD, and recently diagnosed severe pulmonary hypertension who presents for right heart catheterization for further evaluation of her pulmonary hypertension.  The risks and benefits were extensively discussed with the patient and her family who are in agreement to continue.   Signed: Andrez Grime MD 12/27/2021, 7:47 AM

## 2022-01-01 ENCOUNTER — Telehealth: Payer: Self-pay

## 2022-01-01 DIAGNOSIS — J9621 Acute and chronic respiratory failure with hypoxia: Secondary | ICD-10-CM | POA: Diagnosis not present

## 2022-01-01 DIAGNOSIS — I5033 Acute on chronic diastolic (congestive) heart failure: Secondary | ICD-10-CM | POA: Diagnosis not present

## 2022-01-01 DIAGNOSIS — J449 Chronic obstructive pulmonary disease, unspecified: Secondary | ICD-10-CM | POA: Diagnosis not present

## 2022-01-01 DIAGNOSIS — E1122 Type 2 diabetes mellitus with diabetic chronic kidney disease: Secondary | ICD-10-CM | POA: Diagnosis not present

## 2022-01-01 DIAGNOSIS — I130 Hypertensive heart and chronic kidney disease with heart failure and stage 1 through stage 4 chronic kidney disease, or unspecified chronic kidney disease: Secondary | ICD-10-CM | POA: Diagnosis not present

## 2022-01-01 DIAGNOSIS — J9811 Atelectasis: Secondary | ICD-10-CM | POA: Diagnosis not present

## 2022-01-01 NOTE — Chronic Care Management (AMB) (Addendum)
Chronic Care Management Pharmacy Assistant   Name: Kari Medina  MRN: [id number] DOB: Nov 16, 1946  Reason for Encounter: Adherence Review   Recent office visits:  12/11/21 - [name], MD, PCP -Telemedicine, Patient presented for hospital follow up. Recommend weigh daily to monitor fluid status.  Recent consult visits:  12/27/21 [facility] - Patient presented for cardiac catheterization procedure. 12/25/21 - [facility] -Patient presented for doppler study. Increase Lasix to 40mg  twice daily, labs ordered. Heart cath is scheduled.  Hospital visits:  Medication Reconciliation was completed by comparing discharge summary, patients EMR and Pharmacy list, and upon discussion with patient. Admitted to the hospital on 11/19/21 due to Respiratory failurewith hypoxia. Discharge date was 11/26/21. Discharged from [facility].    New?Medications Started at [facility] Discharge:?? -started furosemide -start spironolactone    Medication Changes at Hospital Discharge: -Changed propanolol  Medications Discontinued at Hospital Discharge: -Stopped HCTZ   Stopped torsemide  Stopped potassium chloride   Medications that remain the same after Hospital Discharge:??  -All other medications will remain the same.    Medications: Outpatient Encounter Medications as of 01/01/2022  Medication Sig   acetaminophen (TYLENOL) 650 MG CR tablet Take 650 mg by mouth every 8 (eight) hours as needed for pain.   albuterol (PROVENTIL HFA) 108 (90 Base) MCG/ACT inhaler Inhale 2 puffs into the lungs every 6 (six) hours as needed for wheezing or shortness of breath.   ALPRAZolam (XANAX) 0.25 MG tablet Take 1 tablet (0.25 mg total) by mouth 2 (two) times daily as needed for anxiety.   aspirin 325 MG EC tablet Take 162.5 mg by mouth daily.   budesonide-formoterol (SYMBICORT) 160-4.5 MCG/ACT inhaler Inhale 2 puffs into the lungs 2 (two) times daily.   calcium carbonate (OS-CAL) 600 MG TABS Take 600 mg by  mouth daily with breakfast.   cholecalciferol (VITAMIN D) 1000 units tablet Take 2,000 Units by mouth 2 (two) times daily.   DULoxetine (CYMBALTA) 60 MG capsule TAKE 1 CAPSULE BY MOUTH  DAILY   fluticasone (FLONASE) 50 MCG/ACT nasal spray Place 1 spray into both nostrils daily as needed for allergies.   furosemide (LASIX) 40 MG tablet Take 1 tablet (40 mg total) by mouth daily.   glucose blood (ONETOUCH VERIO) test strip Use to check blood sugar once a day. E11.9   levothyroxine (SYNTHROID) 200 MCG tablet TAKE 1 TABLET BY MOUTH  DAILY BEFORE BREAKFAST   Magnesium 500 MG TABS Take 500 mg by mouth daily.   metFORMIN (GLUCOPHAGE-XR) 500 MG 24 hr tablet TAKE 1 TABLET BY MOUTH  DAILY WITH BREAKFAST   montelukast (SINGULAIR) 10 MG tablet TAKE 1 TABLET BY MOUTH AT  BEDTIME   ONETOUCH DELICA LANCETS FINE MISC 1 each by Does not apply route daily. Use to check blood sugar once a day. E11.9   propranolol (INDERAL) 20 MG tablet Take 1 tablet (20 mg total) by mouth 2 (two) times daily.   rOPINIRole (REQUIP) 3 MG tablet TAKE ONE TABLET BY MOUTH AT BEDTIME   silver sulfADIAZINE (SILVADENE) 1 % cream Apply 1 application topically daily.   spironolactone (ALDACTONE) 25 MG tablet Take 1 tablet (25 mg total) by mouth daily.   Turmeric Curcumin 500 MG CAPS Take 500 mg by mouth at bedtime.    vitamin B-12 (CYANOCOBALAMIN) 1000 MCG tablet Take 1,000 mcg by mouth daily.   vitamin C (ASCORBIC ACID) 500 MG tablet Take 500 mg by mouth daily.   Vitamin E 45 MG (100 UNIT) CAPS Take 100  Units by mouth daily.   zinc gluconate 50 MG tablet Take 50 mg by mouth at bedtime.   No facility-administered encounter medications on file as of 01/01/2022.      Contacted Janyce Ellinger on 01/08/22 for general disease state and medication adherence call.   Patient is not more than 5 days past due for refill on the following medications per chart history:  [facility] Medications: Medication Name/mg Last Fill Days  Supply Metformin XR 500mg   11/11/21 90   What concerns do you have about your medications? The patient reports she does need a refill on the Albuterol for her nebulizer machine,to be sent to [facility] mail order, request delivered for the patient .  The patient denies side effects with their medications.   How often do you forget or accidentally miss a dose? Rarely The patient has been in hospital for a few days and some medicines or supplement doses get missed.   Do you use a pillbox? Yes The patient reports she puts out 2 weeks at a time   Are you having any problems getting your medications from your pharmacy? No  The patient uses [facility] for most maintenance medications. Hospitalization recently and some medication changes so local pharmacy [facility] used for those prescriptions   Has the cost of your medications been a concern? No   Since last visit with CPP, the following interventions have been made.  The patient is on home oxygen daily. The patient has therapy to help her breathing. She uses inhalers as needed and is requesting a refill on her albuterol for the nebulizer. Request sent to CPP  The patient has had an ED visit since last contact. 11/19/21 [facility]- [facility]21 [facility]  The patient reports the following problems with their health. Breathing issues with walking , trying to concentrate on deep breaths  Patient denies concerns or questions for Debbora Dus, PharmD at this time.   Counseled patient on:  [place] job taking medications, Importance of taking medication daily without missed doses, Benefits of adherence packaging or a pillbox, and Access to CCM team for any cost, medication or pharmacy concerns.   Care Gaps: Annual wellness visit in last year? Yes Most Recent BP reading:118/80  69-P  If Diabetic: Most recent A1C reading:7.2  10/29/21 Last eye exam / retinopathy screening:01/15/21 Last diabetic foot exam: unknown  Upcoming appointments: PCP appointment on  01/14/22  [facility]  Debbora Dus, CPP notified  Avel Sensor, [place] Assistant [phone]  I have reviewed the care management and care coordination activities outlined in this encounter and I am certifying that I agree with the content of this note. See addendum.  Debbora Dus, PharmD Clinical Pharmacist [facility] at [facility] [phone]

## 2022-01-02 DIAGNOSIS — E039 Hypothyroidism, unspecified: Secondary | ICD-10-CM | POA: Diagnosis not present

## 2022-01-02 DIAGNOSIS — I7300 Raynaud's syndrome without gangrene: Secondary | ICD-10-CM | POA: Diagnosis not present

## 2022-01-02 DIAGNOSIS — J9811 Atelectasis: Secondary | ICD-10-CM | POA: Diagnosis not present

## 2022-01-02 DIAGNOSIS — E1151 Type 2 diabetes mellitus with diabetic peripheral angiopathy without gangrene: Secondary | ICD-10-CM | POA: Diagnosis not present

## 2022-01-02 DIAGNOSIS — S2242XD Multiple fractures of ribs, left side, subsequent encounter for fracture with routine healing: Secondary | ICD-10-CM | POA: Diagnosis not present

## 2022-01-02 DIAGNOSIS — E1122 Type 2 diabetes mellitus with diabetic chronic kidney disease: Secondary | ICD-10-CM | POA: Diagnosis not present

## 2022-01-02 DIAGNOSIS — I130 Hypertensive heart and chronic kidney disease with heart failure and stage 1 through stage 4 chronic kidney disease, or unspecified chronic kidney disease: Secondary | ICD-10-CM | POA: Diagnosis not present

## 2022-01-02 DIAGNOSIS — R251 Tremor, unspecified: Secondary | ICD-10-CM | POA: Diagnosis not present

## 2022-01-02 DIAGNOSIS — G4733 Obstructive sleep apnea (adult) (pediatric): Secondary | ICD-10-CM | POA: Diagnosis not present

## 2022-01-02 DIAGNOSIS — E785 Hyperlipidemia, unspecified: Secondary | ICD-10-CM | POA: Diagnosis not present

## 2022-01-02 DIAGNOSIS — M797 Fibromyalgia: Secondary | ICD-10-CM | POA: Diagnosis not present

## 2022-01-02 DIAGNOSIS — F410 Panic disorder [episodic paroxysmal anxiety] without agoraphobia: Secondary | ICD-10-CM | POA: Diagnosis not present

## 2022-01-02 DIAGNOSIS — I712 Thoracic aortic aneurysm, without rupture, unspecified: Secondary | ICD-10-CM | POA: Diagnosis not present

## 2022-01-02 DIAGNOSIS — J9621 Acute and chronic respiratory failure with hypoxia: Secondary | ICD-10-CM | POA: Diagnosis not present

## 2022-01-02 DIAGNOSIS — N1831 Chronic kidney disease, stage 3a: Secondary | ICD-10-CM | POA: Diagnosis not present

## 2022-01-02 DIAGNOSIS — K760 Fatty (change of) liver, not elsewhere classified: Secondary | ICD-10-CM | POA: Diagnosis not present

## 2022-01-02 DIAGNOSIS — W19XXXD Unspecified fall, subsequent encounter: Secondary | ICD-10-CM | POA: Diagnosis not present

## 2022-01-02 DIAGNOSIS — J449 Chronic obstructive pulmonary disease, unspecified: Secondary | ICD-10-CM | POA: Diagnosis not present

## 2022-01-02 DIAGNOSIS — F329 Major depressive disorder, single episode, unspecified: Secondary | ICD-10-CM | POA: Diagnosis not present

## 2022-01-02 DIAGNOSIS — I5033 Acute on chronic diastolic (congestive) heart failure: Secondary | ICD-10-CM | POA: Diagnosis not present

## 2022-01-02 DIAGNOSIS — M159 Polyosteoarthritis, unspecified: Secondary | ICD-10-CM | POA: Diagnosis not present

## 2022-01-02 DIAGNOSIS — G8929 Other chronic pain: Secondary | ICD-10-CM | POA: Diagnosis not present

## 2022-01-02 DIAGNOSIS — G2581 Restless legs syndrome: Secondary | ICD-10-CM | POA: Diagnosis not present

## 2022-01-02 DIAGNOSIS — Z9181 History of falling: Secondary | ICD-10-CM | POA: Diagnosis not present

## 2022-01-06 DIAGNOSIS — I10 Essential (primary) hypertension: Secondary | ICD-10-CM | POA: Diagnosis not present

## 2022-01-06 DIAGNOSIS — G4733 Obstructive sleep apnea (adult) (pediatric): Secondary | ICD-10-CM | POA: Diagnosis not present

## 2022-01-06 DIAGNOSIS — I272 Pulmonary hypertension, unspecified: Secondary | ICD-10-CM | POA: Diagnosis not present

## 2022-01-07 DIAGNOSIS — J9621 Acute and chronic respiratory failure with hypoxia: Secondary | ICD-10-CM | POA: Diagnosis not present

## 2022-01-07 DIAGNOSIS — E1122 Type 2 diabetes mellitus with diabetic chronic kidney disease: Secondary | ICD-10-CM | POA: Diagnosis not present

## 2022-01-07 DIAGNOSIS — J9811 Atelectasis: Secondary | ICD-10-CM | POA: Diagnosis not present

## 2022-01-07 DIAGNOSIS — J449 Chronic obstructive pulmonary disease, unspecified: Secondary | ICD-10-CM | POA: Diagnosis not present

## 2022-01-07 DIAGNOSIS — I5033 Acute on chronic diastolic (congestive) heart failure: Secondary | ICD-10-CM | POA: Diagnosis not present

## 2022-01-07 DIAGNOSIS — I130 Hypertensive heart and chronic kidney disease with heart failure and stage 1 through stage 4 chronic kidney disease, or unspecified chronic kidney disease: Secondary | ICD-10-CM | POA: Diagnosis not present

## 2022-01-08 NOTE — Telephone Encounter (Addendum)
Patient requests refill on albuterol nebulizer solution to mail order pharmacy. She reports using it 1-2 times daily. No active prescription but pt reports using routinely. I contacted her pulmonologist for the refill.

## 2022-01-10 ENCOUNTER — Telehealth: Payer: Self-pay

## 2022-01-10 NOTE — Telephone Encounter (Signed)
[name] - Client TELEPHONE ADVICE RECORD [facility] Patient Name: Kari Medina Gender: Female DOB: 08-31-1946 Age: 76 Y 33 M 12 D Return Phone Number: [phone] (Primary), [phone] (Secondary) Address: City/ State/ Zip: [place]  [address] Client [name] - Client Client Site [name] - [name] Provider [name]- MD Contact Type Call Who Is Calling Patient / Member / Family / Caregiver Call Type Triage / Clinical Relationship To Patient Self Return Phone Number [phone] (Primary) Chief Complaint Leg Swelling And Edema Reason for Call Symptomatic / Request for [place] states she has pain in her left foot. It is turning red and her foot and half her calf is swollen. [name] Translation No Nurse Assessment Nurse: [name], RN, Sherrie Date/Time (Eastern Time): 01/10/2022 1:12:21 PM Confirm and document reason for call. If symptomatic, describe symptoms. ---Caller states has left foot pain. Left foot is red and swollen and swelling goes 1/2 up calf. States unable to stand on leg yesterday due to the pain. Pain on top of foot. Pain started yesterday but redness started a while back. Already has appt scheduled for Tuesday (Wellness). +fluids, +UOP in last 8 hrs. No fever. Does the patient have any new or worsening symptoms? ---Yes Will a triage be completed? ---Yes Related visit to physician within the last 2 weeks? ---No Does the PT have any chronic conditions? (i.e. diabetes, asthma, this includes High risk factors for pregnancy, etc.) ---Yes List chronic conditions. ---Pulmonary Hypertension, AAA (being watched), Raynaud's, Fine tremors. Is this a behavioral health or substance abuse call? ---No Guidelines Guideline Title Affirmed Question Affirmed Notes Nurse Date/Time [name] Time) Leg Swelling  and Edema [1] Thigh, calf, or ankle swelling AND [2] only 1 side [name], RN, Sherrie 01/10/2022 1:19:49 PM PLEASE NOTE: All timestamps contained within this report are represented as [place] Standard Time. CONFIDENTIALTY NOTICE: This fax transmission is intended only for the addressee. It contains information that is legally privileged, confidential or otherwise protected from use or disclosure. If you are not the intended recipient, you are strictly prohibited from reviewing, disclosing, copying using or disseminating any of this information or taking any action in reliance on or regarding this information. If you have received this fax in error, please notify us immediately by telephone so that we can arrange for its return to [place]. Phone: [phone], Toll-Free: [phone], Fax: [phone] Page: 2 of 2 Call Id: [id number] [name]. Time [name] Time) Disposition Final User 01/10/2022 1:39:21 PM See HCP within 4 Hours (or PCP triage) Yes [name], RN, Sherrie Caller Disagree/Comply Comply Caller Understands Yes PreDisposition InappropriateToAsk Care Advice Given Per Guideline * IF OFFICE WILL BE OPEN: You need to be seen within the next 3 or 4 hours. Call your doctor (or NP/PA) now or as soon as the office opens. Comments User: [name], RN Date/Time (Eastern Time): 01/10/2022 1:38:23 PM States unable to make 3pm appt in [place]. Given options of UC. Referrals Warm transfer to [facility]

## 2022-01-10 NOTE — Telephone Encounter (Signed)
I agree with those recommendations.

## 2022-01-10 NOTE — Telephone Encounter (Signed)
Eugenia Pancoast, FNP to Me  Venia Carbon, MD  Pilar Grammes, CMA   TD    4:14 PM  Yes I agree with your recommendations, definitely concerning for blood clot with increased swelling, redness, and not being able to stand on foot. Waiting until 1/24 if a blood clot could be fatal.     I spoke with pt and advised as instructed by Red Christians FNP and Dr Silvio Pate. Pt voiced understanding. Pt said with her elevating her foot for couple of hours it is not as red and her husband had massaged her foot earlier. I advised pt not to massage leg or foot so if there is a blood clot could dislodge the clot with massage. Pt voiced understanding and said she will probably do what she said before by observing symptoms tonight a nd if condition worsens pt will go to ED otherwise will keep appt with Dr Silvio Pate on 01/14/22. Sending FYI to Dr Silvio Pate and Eugenia Pancoast FNP.

## 2022-01-10 NOTE — Telephone Encounter (Signed)
I spoke with pt; pt said she said that she spoke with her daughter and there is a lot of covid around and pt  has decided not to go to [facility] or ED. Pt said she has her leg elevated above her heart and she does not think this is serious and pt already has appt to see Dr Silvio Pate on 01/14/22 at 11:30 AM. Pt said starting on 01/09/22 pt began with swelling and redness on top of lt foot. Pt said it hurt more yesterday than today. Pt said also has some swelling in lt lower leg but no redness to lt lower leg. Pt said she does not have CP or SOB. Explained to pt it can be different things that cause these symptoms but most concerning would be cellulitis or blood clot. Pt said she does not think it is anything serious and will wait and see how she does overnight. I offered to make pt an appt at [facility] today but pt said no because she is afraid of covid. UC and ED precautions given and pt voiced understanding and said tomorrow if worse she will go to be seen at [facility]. Sending note to Dr Silvio Pate who is out of office, Eugenia Pancoast FNP and [facility] CMA.

## 2022-01-12 ENCOUNTER — Other Ambulatory Visit: Payer: Self-pay | Admitting: Internal Medicine

## 2022-01-13 ENCOUNTER — Encounter: Payer: Self-pay | Admitting: Internal Medicine

## 2022-01-13 ENCOUNTER — Ambulatory Visit (INDEPENDENT_AMBULATORY_CARE_PROVIDER_SITE_OTHER): Payer: Medicare Other | Admitting: Internal Medicine

## 2022-01-13 ENCOUNTER — Other Ambulatory Visit: Payer: Self-pay

## 2022-01-13 DIAGNOSIS — I272 Pulmonary hypertension, unspecified: Secondary | ICD-10-CM

## 2022-01-13 DIAGNOSIS — J449 Chronic obstructive pulmonary disease, unspecified: Secondary | ICD-10-CM | POA: Diagnosis not present

## 2022-01-13 DIAGNOSIS — M7989 Other specified soft tissue disorders: Secondary | ICD-10-CM | POA: Diagnosis not present

## 2022-01-13 DIAGNOSIS — J9611 Chronic respiratory failure with hypoxia: Secondary | ICD-10-CM

## 2022-01-13 DIAGNOSIS — I5032 Chronic diastolic (congestive) heart failure: Secondary | ICD-10-CM | POA: Diagnosis not present

## 2022-01-13 NOTE — Progress Notes (Signed)
Subjective:    Patient ID: Kari Medina, female    DOB: 1946-04-17, 76 y.o.   MRN: [id number]  HPI Here with husband due to leg leg swelling  Has friend who is nurse---visited 2 days ago Noted 3 days ago that left leg was red and swollen She had not been concerned otherwise--she has had similar swelling in the past  Continues on furosemide and spironolactone Cardiac cath did confirm severe pulmonary hypertension---goes March 1st to specialist at [facility] about this Not weighing daily---about every 3rd day  Still not walking well Afraid of falling even just standing on the scale  Breathing is better since going on oxygen 24/7 Now feels worse when off the oxygen (makes it quick when she does---like changing clothes) On 3.5 l/min now  Leg doesn't hurt  Current Outpatient Medications on File Prior to Visit  Medication Sig Dispense Refill   acetaminophen (TYLENOL) 650 MG CR tablet Take 650 mg by mouth every 8 (eight) hours as needed for pain.     albuterol (PROVENTIL HFA) 108 (90 Base) MCG/ACT inhaler Inhale 2 puffs into the lungs every 6 (six) hours as needed for wheezing or shortness of breath. 18 g 2   ALPRAZolam (XANAX) 0.25 MG tablet Take 1 tablet (0.25 mg total) by mouth 2 (two) times daily as needed for anxiety. 30 tablet 0   aspirin 325 MG EC tablet Take 162.5 mg by mouth daily.     budesonide-formoterol (SYMBICORT) 160-4.5 MCG/ACT inhaler Inhale 2 puffs into the lungs 2 (two) times daily.     calcium carbonate (OS-CAL) 600 MG TABS Take 600 mg by mouth daily with breakfast.     DULoxetine (CYMBALTA) 60 MG capsule TAKE 1 CAPSULE BY MOUTH  DAILY 90 capsule 3   furosemide (LASIX) 40 MG tablet Take 1 tablet (40 mg total) by mouth daily. 30 tablet 1   glucose blood (ONETOUCH VERIO) test strip Use to check blood sugar once a day. E11.9 100 each 3   levothyroxine (SYNTHROID) 200 MCG tablet TAKE 1 TABLET BY MOUTH  DAILY BEFORE BREAKFAST 90 tablet 3   metFORMIN (GLUCOPHAGE-XR) 500  MG 24 hr tablet TAKE 1 TABLET BY MOUTH  DAILY WITH BREAKFAST 90 tablet 3   montelukast (SINGULAIR) 10 MG tablet TAKE 1 TABLET BY MOUTH AT  BEDTIME 90 tablet 3   ONETOUCH DELICA LANCETS FINE MISC 1 each by Does not apply route daily. Use to check blood sugar once a day. E11.9 100 each 3   propranolol (INDERAL) 20 MG tablet Take 1 tablet (20 mg total) by mouth 2 (two) times daily. 60 tablet 1   rOPINIRole (REQUIP) 3 MG tablet TAKE ONE TABLET BY MOUTH AT BEDTIME 90 tablet 3   silver sulfADIAZINE (SILVADENE) 1 % cream Apply 1 application topically daily. 50 g 0   spironolactone (ALDACTONE) 25 MG tablet Take 1 tablet (25 mg total) by mouth daily. 30 tablet 1   No current facility-administered medications on file prior to visit.    Allergies  Allergen Reactions   Latex Hives    Tape and bandaids only   Nickel Dermatitis   Pramipexole Other (See Comments)    Caused hallucinations, says she can take name brand.   Prednisone     Makes her "feel crazy" - can take low dosage    Topiramate Other (See Comments)    "spaced out"   Citalopram Anxiety   Paroxetine Hcl Anxiety   Sertraline Anxiety    Past Medical History:  Diagnosis Date  Asthma    Chronic hypoxemic respiratory failure (HCC)    uses O2 with exertion and with CPAP   COPD (chronic obstructive pulmonary disease) ([place])    Depression    Diabetes mellitus without complication (HCC)    Dyspnea    doe   Dysrhythmia    extra beat   Fatty liver    Fibromyalgia    Generalized osteoarthritis of multiple sites    History of hiatal hernia    Hypertension    Hypothyroidism    Melanoma ([place]) 07/2018   Right leg   OSA (obstructive sleep apnea)    Pain    chronic ruq and back pain   Panic attacks    PONV (postoperative nausea and vomiting)    after thyroidectomy   Raynaud disease    RLS (restless legs syndrome)    Spleen absent    TOLD ABSENT THEN TOLD DOES HAVE SPLEEN. PATIENT IS UNCERTAIN   Tremor, essential    Wears  dentures    full upper and lower    Past Surgical History:  Procedure Laterality Date   CATARACT EXTRACTION W/PHACO Right 03/04/2021   Procedure: CATARACT EXTRACTION PHACO AND INTRAOCULAR LENS PLACEMENT (IOC) RIGHT DIABETIC 3.98 00:33.2;  Surgeon: Eulogio Bear, MD;  Location: [place] [facility];  Service: Ophthalmology;  Laterality: Right;  Diabetic - oral meds   CATARACT EXTRACTION W/PHACO Left 03/18/2021   Procedure: CATARACT EXTRACTION PHACO AND INTRAOCULAR LENS PLACEMENT ([place]) LEFT;  Surgeon: Eulogio Bear, MD;  Location: [facility];  Service: Ophthalmology;  Laterality: Left;  2.96 0:27.9   CHOLECYSTECTOMY     DILATATION & CURETTAGE/HYSTEROSCOPY WITH MYOSURE N/A 11/10/2018   Procedure: DILATATION & CURETTAGE/HYSTEROSCOPY WITH MYOSURE/MYOMECTOMY;  Surgeon: Aletha Halim, MD;  Location: [facility];  Service: Gynecology;  Laterality: N/A;  possible myosure.  Please use myosure scope, do not open myosure blades but have in the room   JOINT REPLACEMENT Bilateral    2008/2011   MELANOMA EXCISION  07/2018   REVERSE SHOULDER ARTHROPLASTY Right 05/28/2017   Procedure: REVERSE SHOULDER ARTHROPLASTY;  Surgeon: Corky Mull, MD;  Location: [facility];  Service: Orthopedics;  Laterality: Right;   RHINOPLASTY  1972   RIGHT HEART CATH N/A 12/27/2021   Procedure: RIGHT HEART CATH;  Surgeon: Andrez Grime, MD;  Location: [facility];  Service: Cardiovascular;  Laterality: N/A;   THYROIDECTOMY  2006   TOTAL KNEE ARTHROPLASTY     bilateral    Family History  Problem Relation Age of Onset   Osteoarthritis Mother    Diabetes Mother    Cirrhosis Mother    Cancer Father    Kidney cancer Father    Bladder Cancer Father    Heart disease Brother        stents in 1 brother   Breast cancer Neg Hx     Social History   Socioeconomic History   Marital status: Married    Spouse name: Not on file   Number of children: 1   Years of education: Not on  file   Highest education level: Not on file  Occupational History   Occupation: Neurosurgeon    Comment: Retired  Tobacco Use   Smoking status: Former    Packs/day: 0.25    Years: 5.00    Pack years: 1.25    Types: Cigarettes    Quit date: 12/22/1988    Years since quitting: 33.0   Smokeless tobacco: Never  Vaping Use   Vaping  Use: Never used  Substance and Sexual Activity   Alcohol use: No   Drug use: No   Sexual activity: Not on file  Other Topics Concern   Not on file  Social History Narrative   1 daughter      Has living will   Husband has health care POA. Alternate would be daughter Judson Roch   Would allow resuscitation but no prolonged machines   Not sure about feeding tubes   Social Determinants of Health   Financial Resource Strain: Not on file  Food Insecurity: Not on file  Transportation Needs: Not on file  Physical Activity: Not on file  Stress: Not on file  Social Connections: Not on file  Intimate Partner Violence: Not on file   Review of Systems No fever Did have some left foot pain for a while---thought it was mechanical (now better with elevation, etc) Appetite is not good Weight is down considerably     Objective:   Physical Exam Constitutional:      General: She is not in acute distress.    Comments: Oxygen on  Cardiovascular:     Rate and Rhythm: Normal rate and regular rhythm.     Heart sounds: No murmur heard.   No gallop.     Comments: Distant breath sounds Pulmonary:     Effort: Pulmonary effort is normal.     Breath sounds: Normal breath sounds. No wheezing or rales.  Musculoskeletal:     Cervical back: Neck supple.     Comments: 1+ edema on left, trace on right Dependent purplish discoloration in toes and forefoot--mostly on right---but pulses are 1+ No calf tenderness  Lymphadenopathy:     Cervical: No cervical adenopathy.  Neurological:     Mental Status: She is alert.           Assessment & Plan:

## 2022-01-13 NOTE — Telephone Encounter (Signed)
That is reassuring that this may just be edema (CHF vs venous insufficiency) I will see her in a little while

## 2022-01-13 NOTE — Assessment & Plan Note (Signed)
Stable on symbicort 2 puffs bid

## 2022-01-13 NOTE — Assessment & Plan Note (Signed)
Does not appear to be DVT or infection Swelling is better since elevation No action needed about this now

## 2022-01-13 NOTE — Telephone Encounter (Signed)
Kari Carbon, MD to Me  Kari Medina, CMA      9:50 AM  You can add her Monday at 1:45   I spoke with pt and pt said her leg swelling goes down overnight but swelling comes back during the day. No CP or unusual SOB. Pt agreed to come to [facility] today at 1:45. Pt will check in at 1:30.UC & ED precautions reviewed and pt voiced understanding. Sending note to Dr Silvio Pate and Larene Beach CMA.

## 2022-01-13 NOTE — Assessment & Plan Note (Signed)
Fluid status is better on furosemide 40mg  daily and spironolactone 25mg  daily. Urged her to weigh daily with husband's help--so action can be taken if weight goes up

## 2022-01-13 NOTE — Assessment & Plan Note (Signed)
Saturations okay on 3.5 liters per minute

## 2022-01-13 NOTE — Assessment & Plan Note (Signed)
Severe by cath Is going to [facility] to see specialist (March)

## 2022-01-14 ENCOUNTER — Ambulatory Visit: Payer: Medicare Other | Admitting: Internal Medicine

## 2022-01-15 DIAGNOSIS — J9621 Acute and chronic respiratory failure with hypoxia: Secondary | ICD-10-CM | POA: Diagnosis not present

## 2022-01-15 DIAGNOSIS — E1122 Type 2 diabetes mellitus with diabetic chronic kidney disease: Secondary | ICD-10-CM | POA: Diagnosis not present

## 2022-01-15 DIAGNOSIS — I130 Hypertensive heart and chronic kidney disease with heart failure and stage 1 through stage 4 chronic kidney disease, or unspecified chronic kidney disease: Secondary | ICD-10-CM | POA: Diagnosis not present

## 2022-01-15 DIAGNOSIS — J449 Chronic obstructive pulmonary disease, unspecified: Secondary | ICD-10-CM | POA: Diagnosis not present

## 2022-01-15 DIAGNOSIS — J9811 Atelectasis: Secondary | ICD-10-CM | POA: Diagnosis not present

## 2022-01-15 DIAGNOSIS — I5033 Acute on chronic diastolic (congestive) heart failure: Secondary | ICD-10-CM | POA: Diagnosis not present

## 2022-01-22 DIAGNOSIS — J9621 Acute and chronic respiratory failure with hypoxia: Secondary | ICD-10-CM | POA: Diagnosis not present

## 2022-01-22 DIAGNOSIS — I130 Hypertensive heart and chronic kidney disease with heart failure and stage 1 through stage 4 chronic kidney disease, or unspecified chronic kidney disease: Secondary | ICD-10-CM | POA: Diagnosis not present

## 2022-01-22 DIAGNOSIS — I5033 Acute on chronic diastolic (congestive) heart failure: Secondary | ICD-10-CM | POA: Diagnosis not present

## 2022-01-22 DIAGNOSIS — J449 Chronic obstructive pulmonary disease, unspecified: Secondary | ICD-10-CM | POA: Diagnosis not present

## 2022-01-22 DIAGNOSIS — J9811 Atelectasis: Secondary | ICD-10-CM | POA: Diagnosis not present

## 2022-01-22 DIAGNOSIS — E1122 Type 2 diabetes mellitus with diabetic chronic kidney disease: Secondary | ICD-10-CM | POA: Diagnosis not present

## 2022-01-23 ENCOUNTER — Telehealth: Payer: Self-pay | Admitting: Internal Medicine

## 2022-01-23 MED ORDER — SPIRONOLACTONE 25 MG PO TABS: 25.0000 mg | ORAL_TABLET | Freq: Every day | ORAL | 1 refills | Status: DC

## 2022-01-23 MED ORDER — FUROSEMIDE 40 MG PO TABS: 40.0000 mg | ORAL_TABLET | Freq: Every day | ORAL | 1 refills | Status: DC

## 2022-01-23 NOTE — Telephone Encounter (Signed)
°  Encourage patient to contact the pharmacy for refills or they can request refills through [facility]:  Please schedule appointment if longer than 1 year  NEXT APPOINTMENT DATE: spironolactone (ALDACTONE) 25 MG tablet,furosemide (LASIX) 40 MG tablet MEDICATION:  Is the patient out of medication?   PHARMACY:[facility] in [place]  Let patient know to contact pharmacy at the end of the day to make sure medication is ready.  Please notify patient to allow 48-72 hours to process  CLINICAL FILLS OUT ALL BELOW:   LAST REFILL:  QTY:  REFILL DATE:    OTHER COMMENTS:    Okay for refill?  Please advise

## 2022-01-23 NOTE — Telephone Encounter (Signed)
Rx sent electronically.  

## 2022-01-27 DIAGNOSIS — I10 Essential (primary) hypertension: Secondary | ICD-10-CM | POA: Diagnosis not present

## 2022-01-27 DIAGNOSIS — I272 Pulmonary hypertension, unspecified: Secondary | ICD-10-CM | POA: Diagnosis not present

## 2022-01-27 DIAGNOSIS — I7300 Raynaud's syndrome without gangrene: Secondary | ICD-10-CM | POA: Diagnosis not present

## 2022-01-31 DIAGNOSIS — E1122 Type 2 diabetes mellitus with diabetic chronic kidney disease: Secondary | ICD-10-CM | POA: Diagnosis not present

## 2022-01-31 DIAGNOSIS — J9621 Acute and chronic respiratory failure with hypoxia: Secondary | ICD-10-CM | POA: Diagnosis not present

## 2022-01-31 DIAGNOSIS — J9811 Atelectasis: Secondary | ICD-10-CM | POA: Diagnosis not present

## 2022-01-31 DIAGNOSIS — I5033 Acute on chronic diastolic (congestive) heart failure: Secondary | ICD-10-CM | POA: Diagnosis not present

## 2022-01-31 DIAGNOSIS — I130 Hypertensive heart and chronic kidney disease with heart failure and stage 1 through stage 4 chronic kidney disease, or unspecified chronic kidney disease: Secondary | ICD-10-CM | POA: Diagnosis not present

## 2022-01-31 DIAGNOSIS — J449 Chronic obstructive pulmonary disease, unspecified: Secondary | ICD-10-CM | POA: Diagnosis not present

## 2022-02-01 DIAGNOSIS — S2242XD Multiple fractures of ribs, left side, subsequent encounter for fracture with routine healing: Secondary | ICD-10-CM | POA: Diagnosis not present

## 2022-02-01 DIAGNOSIS — F329 Major depressive disorder, single episode, unspecified: Secondary | ICD-10-CM | POA: Diagnosis not present

## 2022-02-01 DIAGNOSIS — G8929 Other chronic pain: Secondary | ICD-10-CM | POA: Diagnosis not present

## 2022-02-01 DIAGNOSIS — M159 Polyosteoarthritis, unspecified: Secondary | ICD-10-CM | POA: Diagnosis not present

## 2022-02-01 DIAGNOSIS — J449 Chronic obstructive pulmonary disease, unspecified: Secondary | ICD-10-CM | POA: Diagnosis not present

## 2022-02-01 DIAGNOSIS — I5032 Chronic diastolic (congestive) heart failure: Secondary | ICD-10-CM | POA: Diagnosis not present

## 2022-02-01 DIAGNOSIS — N1831 Chronic kidney disease, stage 3a: Secondary | ICD-10-CM | POA: Diagnosis not present

## 2022-02-01 DIAGNOSIS — E039 Hypothyroidism, unspecified: Secondary | ICD-10-CM | POA: Diagnosis not present

## 2022-02-01 DIAGNOSIS — Z9181 History of falling: Secondary | ICD-10-CM | POA: Diagnosis not present

## 2022-02-01 DIAGNOSIS — G2581 Restless legs syndrome: Secondary | ICD-10-CM | POA: Diagnosis not present

## 2022-02-01 DIAGNOSIS — G250 Essential tremor: Secondary | ICD-10-CM | POA: Diagnosis not present

## 2022-02-01 DIAGNOSIS — I7300 Raynaud's syndrome without gangrene: Secondary | ICD-10-CM | POA: Diagnosis not present

## 2022-02-01 DIAGNOSIS — G4733 Obstructive sleep apnea (adult) (pediatric): Secondary | ICD-10-CM | POA: Diagnosis not present

## 2022-02-01 DIAGNOSIS — K760 Fatty (change of) liver, not elsewhere classified: Secondary | ICD-10-CM | POA: Diagnosis not present

## 2022-02-01 DIAGNOSIS — E1151 Type 2 diabetes mellitus with diabetic peripheral angiopathy without gangrene: Secondary | ICD-10-CM | POA: Diagnosis not present

## 2022-02-01 DIAGNOSIS — I499 Cardiac arrhythmia, unspecified: Secondary | ICD-10-CM | POA: Diagnosis not present

## 2022-02-01 DIAGNOSIS — F410 Panic disorder [episodic paroxysmal anxiety] without agoraphobia: Secondary | ICD-10-CM | POA: Diagnosis not present

## 2022-02-01 DIAGNOSIS — J9611 Chronic respiratory failure with hypoxia: Secondary | ICD-10-CM | POA: Diagnosis not present

## 2022-02-01 DIAGNOSIS — W19XXXD Unspecified fall, subsequent encounter: Secondary | ICD-10-CM | POA: Diagnosis not present

## 2022-02-01 DIAGNOSIS — E1122 Type 2 diabetes mellitus with diabetic chronic kidney disease: Secondary | ICD-10-CM | POA: Diagnosis not present

## 2022-02-01 DIAGNOSIS — M797 Fibromyalgia: Secondary | ICD-10-CM | POA: Diagnosis not present

## 2022-02-01 DIAGNOSIS — J9811 Atelectasis: Secondary | ICD-10-CM | POA: Diagnosis not present

## 2022-02-01 DIAGNOSIS — E785 Hyperlipidemia, unspecified: Secondary | ICD-10-CM | POA: Diagnosis not present

## 2022-02-01 DIAGNOSIS — I130 Hypertensive heart and chronic kidney disease with heart failure and stage 1 through stage 4 chronic kidney disease, or unspecified chronic kidney disease: Secondary | ICD-10-CM | POA: Diagnosis not present

## 2022-02-03 ENCOUNTER — Telehealth: Payer: Self-pay | Admitting: Internal Medicine

## 2022-02-03 NOTE — Telephone Encounter (Signed)
Verbal orders left on verified VM for [facility].

## 2022-02-03 NOTE — Telephone Encounter (Signed)
Home Health verbal orders Caller Name: Arthur Name: Jobie Quaker number: [phone]  Requesting OT/PT/Skilled nursing/Social Work/Speech: PT  Reason: balance training, gait, arobic   Frequency: 1w8  Please forward to [facility] pool or providers CMA

## 2022-02-04 ENCOUNTER — Telehealth: Payer: Self-pay

## 2022-02-04 ENCOUNTER — Telehealth: Payer: Self-pay | Admitting: Internal Medicine

## 2022-02-04 NOTE — Telephone Encounter (Signed)
I spoke with pt; pt said that she has had rash in all her creases since she was in hospital in [name] 2023.pt said no blisters. Pt said she tries to keep it cool in house but does wear flannel pajamas. Pt said has not had any CP and no more SOB than usual. Pt is using 3.5 L of oxygen. Pulse ox now is 94%. Pt is not having any pain and no radiation of chest pressure. Pt said ankles and feet swell on and off. Pt taking furosemide and has not missed any med. Pt said slight swelling in feet but no swelling in legs.pt said pressure in chest that goes all the way across upper chest started today after pt began thinking and feeling anxious.  Pt is not sure how long chest pressure lasted today. Pt said she has felt bad all day. No available appts at [facility] 02/04/22 or 02/05/22. Pt said she is not going anywhere and does not want an appt; pt just wants anxiety med refilled. UC & ED precautions given and pt voiced understanding.Sending note to Dr Silvio Pate who is out of office this afternoon but will be in office on 02/05/22. Also sending note to Dr Glori Bickers who is in office for review. Sending note to [facility] CMA.   Name of Medication: alprazolam 0.25 mg Name of Pharmacy: [facility] or Written Date and Quantity: # 30 on 11/06/2021 Last Office Visit and Type: 01/13/22  leg swelling Next Office Visit and Type: 04/24/22 AWV   Pt is out of alprazolam.

## 2022-02-04 NOTE — Telephone Encounter (Signed)
Gerald Stabs called in from home health and stated that Ms Selmer is on oxygen 2liter a min but wanted to change it Prn 2-4 liters a min

## 2022-02-04 NOTE — Telephone Encounter (Signed)
Aware - do you need me to refill med in pcp absence?    I don't understand purpose of the call

## 2022-02-04 NOTE — Telephone Encounter (Signed)
[facility] - Client TELEPHONE ADVICE RECORD [facility] Patient Name: Kari Medina Gender: Female DOB: 06-27-1946 Age: 76 Y 29 M 6 D Return Phone Number: [phone] (Primary), [phone] (Secondary) Address: City/ State/ Zip: [place]  [address] Client [name] - Client Client Site [place] - Day Provider [name]- MD Contact Type Call Who Is Calling Patient / Member / Family / Caregiver Call Type Triage / Clinical Relationship To Patient Self Return Phone Number [phone] (Primary) Chief Complaint CHEST PAIN - pain, pressure, heaviness or tightness Reason for Call Symptomatic / Request for [place] states she has anxiety and takes Xanax, caller states she has pressure in her chest on top of her breast and a rash under her breast and in other areas of her body. Translation No Nurse Assessment Nurse: Hum[place]et, RN, Estill Bamberg Date/Time (Eastern Time): 02/04/2022 2:12:55 PM Confirm and document reason for call. If symptomatic, describe symptoms. ---caller states she has anxiety. she denies chest pain. also has a rash in the creases of her body under breast, groin. out of anxiety med. today having issues with anxiety. she is asking for xanax. Does the patient have any new or worsening symptoms? ---Yes Will a triage be completed? ---Yes Related visit to physician within the last 2 weeks? ---No Does the PT have any chronic conditions? (i.e. diabetes, asthma, this includes High risk factors for pregnancy, etc.) ---Yes List chronic conditions. ---anxiety, tremors, Raynaud's Is this a behavioral health or substance abuse call? ---No Guidelines Guideline Title Affirmed Question Affirmed Notes Nurse Date/Time (Eastern Time) Breathing Difficulty [1] MODERATE difficulty breathing (e.g., speaks in phrases, SOB even at rest, pulse 100-120) AND [2]  NEW-onset Humfleet, RN, Estill Bamberg 02/04/2022 2:22:24 PM PLEASE NOTE: All timestamps contained within this report are represented as [place] Standard Time. CONFIDENTIALTY NOTICE: This fax transmission is intended only for the addressee. It contains information that is legally privileged, confidential or otherwise protected from use or disclosure. If you are not the intended recipient, you are strictly prohibited from reviewing, disclosing, copying using or disseminating any of this information or taking any action in reliance on or regarding this information. If you have received this fax in error, please notify us immediately by telephone so that we can arrange for its return to [place]. Phone: [phone], Toll-Free: [phone], Fax: [phone] Page: 2 of 2 Call Id: [id number] Guidelines Guideline Title Affirmed Question Affirmed Notes Nurse Date/Time [name] Time) or WORSE than normal Disp. Time [name] Time) Disposition Final User 02/04/2022 2:12:07 PM Send to Urgent Ivan Croft 02/04/2022 2:26:01 PM Go to ED Now Yes Humfleet, RN, Shelly Coss Disagree/Comply Disagree Caller Understands Yes PreDisposition Call Doctor Care Advice Given Per Guideline GO TO ED NOW: * You need to be seen in the Emergency Department. * Go to the ED at ___________ [place] now. Drive carefully. CARE ADVICE given per Breathing Difficulty (Adult) guideline. Comments User: Rozelle [name], RN Date/Time [name] Time): 02/04/2022 2:30:35 PM * patient insists her diff breathing is anxiety and she wants xanax. increased shortness of breath than what she normally has even while resting. can hearing crackling in her breaths when talking. she has rash in skin folds. said she has increased swelling in feet but then said it went down and she has been taking her lasix. * *refusing appointment/ER* said her pcp knows what is wrong with her and she knows it is anxiety* Referrals GO TO FACILITY REFUSE

## 2022-02-05 MED ORDER — ALPRAZOLAM 0.25 MG PO TABS: 0.2500 mg | ORAL_TABLET | Freq: Two times a day (BID) | ORAL | 0 refills | Status: DC | PRN

## 2022-02-05 NOTE — Telephone Encounter (Signed)
Spoke to pt's husband. He said he has increased it to 3.5 liters and she seems to be doing better with her breathing.

## 2022-02-05 NOTE — Telephone Encounter (Signed)
Spoke to pt's husband. He said she is doing better today.

## 2022-02-05 NOTE — Telephone Encounter (Signed)
Please let her know that I refilled the xanax to [facility]. Make sure she is doing okay today

## 2022-02-06 DIAGNOSIS — J9611 Chronic respiratory failure with hypoxia: Secondary | ICD-10-CM | POA: Diagnosis not present

## 2022-02-06 DIAGNOSIS — J449 Chronic obstructive pulmonary disease, unspecified: Secondary | ICD-10-CM | POA: Diagnosis not present

## 2022-02-06 DIAGNOSIS — I5032 Chronic diastolic (congestive) heart failure: Secondary | ICD-10-CM | POA: Diagnosis not present

## 2022-02-06 DIAGNOSIS — J9811 Atelectasis: Secondary | ICD-10-CM | POA: Diagnosis not present

## 2022-02-06 DIAGNOSIS — I130 Hypertensive heart and chronic kidney disease with heart failure and stage 1 through stage 4 chronic kidney disease, or unspecified chronic kidney disease: Secondary | ICD-10-CM | POA: Diagnosis not present

## 2022-02-06 DIAGNOSIS — E1122 Type 2 diabetes mellitus with diabetic chronic kidney disease: Secondary | ICD-10-CM | POA: Diagnosis not present

## 2022-02-07 ENCOUNTER — Telehealth: Payer: Self-pay

## 2022-02-07 NOTE — Chronic Care Management (AMB) (Signed)
° ° °  Chronic Care Management Pharmacy Assistant   Name: Kari Medina  MRN: [id number] DOB: 12-18-1946   Reason for Encounter: Reminder Call   Conditions to be addressed/monitored: HTN, COPD, and CKD Stage 3a   Medications: Outpatient Encounter Medications as of 02/07/2022  Medication Sig   acetaminophen (TYLENOL) 650 MG CR tablet Take 650 mg by mouth every 8 (eight) hours as needed for pain.   albuterol (PROVENTIL HFA) 108 (90 Base) MCG/ACT inhaler Inhale 2 puffs into the lungs every 6 (six) hours as needed for wheezing or shortness of breath.   ALPRAZolam (XANAX) 0.25 MG tablet Take 1 tablet (0.25 mg total) by mouth 2 (two) times daily as needed for anxiety.   aspirin 325 MG EC tablet Take 162.5 mg by mouth daily.   budesonide-formoterol (SYMBICORT) 160-4.5 MCG/ACT inhaler Inhale 2 puffs into the lungs 2 (two) times daily.   calcium carbonate (OS-CAL) 600 MG TABS Take 600 mg by mouth daily with breakfast.   DULoxetine (CYMBALTA) 60 MG capsule TAKE 1 CAPSULE BY MOUTH  DAILY   furosemide (LASIX) 40 MG tablet Take 1 tablet (40 mg total) by mouth daily.   glucose blood (ONETOUCH VERIO) test strip Use to check blood sugar once a day. E11.9   levothyroxine (SYNTHROID) 200 MCG tablet TAKE 1 TABLET BY MOUTH  DAILY BEFORE BREAKFAST   metFORMIN (GLUCOPHAGE-XR) 500 MG 24 hr tablet TAKE 1 TABLET BY MOUTH  DAILY WITH BREAKFAST   montelukast (SINGULAIR) 10 MG tablet TAKE 1 TABLET BY MOUTH AT  BEDTIME   ONETOUCH DELICA LANCETS FINE MISC 1 each by Does not apply route daily. Use to check blood sugar once a day. E11.9   propranolol (INDERAL) 20 MG tablet Take 1 tablet (20 mg total) by mouth 2 (two) times daily.   rOPINIRole (REQUIP) 3 MG tablet TAKE ONE TABLET BY MOUTH AT BEDTIME   silver sulfADIAZINE (SILVADENE) 1 % cream Apply 1 application topically daily.   spironolactone (ALDACTONE) 25 MG tablet Take 1 tablet (25 mg total) by mouth daily.   No facility-administered encounter medications  on file as of 02/07/2022.   Kari Medina was contacted to remind of upcoming telephone visit with Charlene Brooke on 02/12/22 at 8:45am. Patient was reminded to have all medications, supplements and any blood glucose and blood pressure readings available for review at appointment. If unable to reach, a voicemail was left for patient.    Are you having any problems with your medications? No   Do you have any concerns you like to discuss with the pharmacist? Yes  The patient reports she continues to have this rash all over her body that she says she developed after hospital stay.    Star Rating Drugs: Medication:   Last Fill: Day Supply Metformin XR 500mg   01/13/22 [facility] CPP notified  Avel Sensor, [place] Assistant [phone]  Total time spent for month CPA: 10 min

## 2022-02-11 DIAGNOSIS — E1122 Type 2 diabetes mellitus with diabetic chronic kidney disease: Secondary | ICD-10-CM | POA: Diagnosis not present

## 2022-02-11 DIAGNOSIS — I5032 Chronic diastolic (congestive) heart failure: Secondary | ICD-10-CM | POA: Diagnosis not present

## 2022-02-11 DIAGNOSIS — J9811 Atelectasis: Secondary | ICD-10-CM | POA: Diagnosis not present

## 2022-02-11 DIAGNOSIS — I130 Hypertensive heart and chronic kidney disease with heart failure and stage 1 through stage 4 chronic kidney disease, or unspecified chronic kidney disease: Secondary | ICD-10-CM | POA: Diagnosis not present

## 2022-02-11 DIAGNOSIS — J449 Chronic obstructive pulmonary disease, unspecified: Secondary | ICD-10-CM | POA: Diagnosis not present

## 2022-02-11 DIAGNOSIS — J9611 Chronic respiratory failure with hypoxia: Secondary | ICD-10-CM | POA: Diagnosis not present

## 2022-02-12 ENCOUNTER — Telehealth: Payer: Self-pay | Admitting: Pharmacist

## 2022-02-12 ENCOUNTER — Telehealth: Payer: Medicare Other

## 2022-02-12 NOTE — Progress Notes (Unsigned)
Chronic Care Management Pharmacy Note  02/12/2022 Name:  Kari Medina MRN:  [id number] DOB:  Apr 05, 1946  Summary: Patient presented for CCM follow up visit. Focused on chief concern of chronic fatigue. Discussed COPD and hypothyroidism. Encouraged her to start using Trelegy daily in place of Symbicort (as prescribed), follow up with pulmonology/cardiology as planned, and restart her daily antihistamine. Also discussed interest in second opinion from endocrinology for hypothyroidism. Discussed diabetes briefly - BG today 79. Did not have other readings.   Recommendations/Changes made from today's visit: Call if BG < 70 or s/s hypoglycemia.  Request endo referral to Dr. Gabriel Carina from PCP Go ahead and switch your Symbicort with Trelegy (as prescribed) and resume daily antihistamine  Plan: CCM follow up 1 month Patient to schedule pulmonology follow up PCP visit 12/2021   Subjective: Kari Medina is an 76 y.o. year old female who is a primary patient of Venia Carbon, MD.  The CCM team was consulted for assistance with disease management and care coordination needs.    Engaged with patient by telephone for follow up visit in response to provider referral for pharmacy case management and/or care coordination services.   Consent to Services:  The patient was given information about Chronic Care Management services, agreed to services, and gave verbal consent prior to initiation of services.  Please see initial visit note for detailed documentation.   Patient Care Team: Venia Carbon, MD as PCP - General (Internal Medicine) Debbora Dus, [facility] as Pharmacist (Pharmacist)  Recent office visits: 01/13/22 Dr Silvio Pate OV: c/o leg swelling; not weighing daily; no DVT; no med change, advised to weight daily  05/23/21 - CCM call - Patient not taking rosuvastatin due to concern for side effects/lack of need.  Recent consult visits: 01/27/22 Dr Corky Sox (cardiology): f/u pulmonary  HTN. Scheduled 3/1 w/ specialist. No changes.  01/06/22 Dr Corky Sox (Cardiology): Referral to pulmonary HTN clinic.  12/27/21 ADMISSION for Right heart cath 07/09/21 - GI, Initial consult - Patient presented for chronic right upper quadrant pain. Stool test. Discuss colonoscopy with pulmonologist. Follow up 1 month. 06/17/21 - Cardiology - Patient presented for follow up of SOB. HTN well controlled. Chest pain, no obvious ischemia. She is on CPAP, 24/7 oxygen. Holter monitor for palpitations. 06/13/21 - Pulmonology - Patient presented for SOB, COPD, OSA on CPAP. She stopped Lasix due to increased swelling. Weight loss recommended. Continue DuoNeb, Singulair daily. Start Trelegy after finishing out the Symbicort. Oxygen at 2 L. Continue Requip 3 mg. Follow up 6 weeks. 05/08/21 - Endocrinology - Pt presented for Hashimoto's Thyroiditis.  S/p thyroidectomy. Continue levothyroxine 200 mcg daily.  Hospital visits: Medication Reconciliation was completed by comparing discharge summary, patients EMR and Pharmacy list, and upon discussion with patient.  Admitted to the hospital on 11/19/21 due to Acute respiratory failure, rib fracture. Discharge date was 11/26/21. Discharged from [facility].    New?Medications Started at [facility] Discharge:?? -started furosemide 40 mg daily due to HF  Medication Changes at Hospital Discharge: -Changed furosemide to 40 mg daily, spironolactone to 25 mg daily  Medications Discontinued at Hospital Discharge: -Stopped HCTZ, potassium and torsemide   Medications that remain the same after Hospital Discharge:??  -All other medications will remain the same.      Objective:  Lab Results  Component Value Date   CREATININE 1.02 (H) 11/26/2021   BUN 26 (H) 11/26/2021   GFR 47.35 (L) 10/29/2021   GFRNONAA 58 (L) 11/26/2021   GFRAA >60 11/08/2018  NA 137 11/26/2021   K 3.7 11/26/2021   CALCIUM 8.7 (L) 11/26/2021   CO2 28 11/26/2021   GLUCOSE 105 (H) 11/26/2021     Lab Results  Component Value Date/Time   HGBA1C 7.2 (H) 10/29/2021 12:19 PM   HGBA1C 6.5 01/11/2021 01:33 PM   GFR 47.35 (L) 10/29/2021 12:19 PM   GFR 54.98 (L) 01/11/2021 01:33 PM    Last diabetic Eye exam:  Lab Results  Component Value Date/Time   HMDIABEYEEXA No Retinopathy 01/15/2021 12:00 AM    Last diabetic Foot exam: unknown  Lab Results  Component Value Date   CHOL 136 10/29/2021   HDL 27.90 (L) 10/29/2021   LDLCALC 78 10/29/2021   TRIG 151.0 (H) 10/29/2021   CHOLHDL 5 10/29/2021    Hepatic Function Latest Ref Rng & Units 11/19/2021 10/29/2021 10/29/2021  Total Protein 6.5 - 8.1 g/dL 6.9 - 6.5  Albumin 3.5 - 5.0 g/dL 3.5 3.4(L) 3.4(L)  AST 15 - 41 U/L 32 - 23  ALT 0 - 44 U/L 19 - 16  Alk Phosphatase 38 - 126 U/L 75 - 86  Total Bilirubin 0.3 - 1.2 mg/dL 1.6(H) - 0.9  Bilirubin, Direct 0.0 - 0.2 mg/dL 0.3(H) - 0.2    Lab Results  Component Value Date/Time   TSH 2.59 05/08/2021 02:51 PM   TSH 2.68 01/09/2021 03:23 PM   FREET4 1.97 (H) 01/09/2021 03:23 PM   FREET4 1.19 07/06/2020 11:59 AM    CBC Latest Ref Rng & Units 11/20/2021 11/19/2021 10/29/2021  WBC 4.0 - 10.5 K/uL 12.1(H) 11.9(H) 9.7  Hemoglobin 12.0 - 15.0 g/dL 15.4(H) 16.1(H) 16.9(H)  Hematocrit 36.0 - 46.0 % 46.8(H) 48.0(H) 50.4(H)  Platelets 150 - 400 K/uL 162 172 183.0    No results found for: VD25OH  Clinical ASCVD: Yes  The 10-year ASCVD risk score (Arnett DK, et al., 2019) is: 22.4%   Values used to calculate the score:     Age: 76 years     Sex: Female     Is Non-Hispanic African American: No     Diabetic: Yes     Tobacco smoker: No     Systolic Blood Pressure: 884 mmHg     Is BP treated: Yes     HDL Cholesterol: 27.9 mg/dL     Total Cholesterol: 136 mg/dL    Depression screen [facility] 2/9 01/11/2021 08/20/2017  Decreased Interest 0 0  Down, Depressed, Hopeless 0 1  PHQ - 2 Score 0 1    Social History   Tobacco Use  Smoking Status Former   Packs/day: 0.25   Years: 5.00   Pack  years: 1.25   Types: Cigarettes   Quit date: 12/22/1988   Years since quitting: 33.1  Smokeless Tobacco Never   BP Readings from Last 3 Encounters:  01/13/22 122/82  12/27/21 102/76  11/26/21 137/82   Pulse Readings from Last 3 Encounters:  01/13/22 84  12/27/21 68  11/26/21 (!) 102   Wt Readings from Last 3 Encounters:  01/13/22 217 lb (98.4 kg)  12/27/21 224 lb (101.6 kg)  11/25/21 234 lb 2.1 oz (106.2 kg)   BMI Readings from Last 3 Encounters:  01/13/22 37.25 kg/m  12/27/21 38.45 kg/m  11/25/21 40.19 kg/m    Assessment/Interventions: Review of patient past medical history, allergies, medications, health status, including review of consultants reports, laboratory and other test data, was performed as part of comprehensive evaluation and provision of chronic care management services.   SDOH:  (Social Determinants of Health) assessments  and interventions performed: Yes  SDOH Screenings   Alcohol Screen: Not on file  Depression (PHQ2-9): Not on file  Financial Resource Strain: Not on file  Food Insecurity: Not on file  Housing: Not on file  Physical Activity: Not on file  Social Connections: Not on file  Stress: Not on file  Tobacco Use: Medium Risk   Smoking Tobacco Use: Former   Smokeless Tobacco Use: Never   Passive Exposure: Not on file  Transportation Needs: Not on file    [place]  Allergies  Allergen Reactions   Latex Hives    Tape and bandaids only   Nickel Dermatitis   Pramipexole Other (See Comments)    Caused hallucinations, says she can take name brand.   Prednisone     Makes her "feel crazy" - can take low dosage    Topiramate Other (See Comments)    "spaced out"   Citalopram Anxiety   Paroxetine Hcl Anxiety   Sertraline Anxiety    Medications Reviewed Today     Reviewed by Venia Carbon, MD (Physician) on 01/13/22 at 1351  Med List Status: <None>   Medication Order Taking? Sig Documenting Provider Last Dose Status Informant   acetaminophen (TYLENOL) 650 MG CR tablet [id number] Yes Take 650 mg by mouth every 8 (eight) hours as needed for pain. [provider] Taking Active Self  albuterol (PROVENTIL HFA) 108 (90 Base) MCG/ACT inhaler [id number] Yes Inhale 2 puffs into the lungs every 6 (six) hours as needed for wheezing or shortness of breath. Venia Carbon, MD Taking Active Self  ALPRAZolam Duanne Moron) 0.25 MG tablet [id number] Yes Take 1 tablet (0.25 mg total) by mouth 2 (two) times daily as needed for anxiety. Venia Carbon, MD Taking Active Self  aspirin 325 MG EC tablet [id number] Yes Take 162.5 mg by mouth daily. [provider] Taking Active Self  budesonide-formoterol (SYMBICORT) 160-4.5 MCG/ACT inhaler [id number] Yes Inhale 2 puffs into the lungs 2 (two) times daily. [provider] Taking Active Self  calcium carbonate (OS-CAL) 600 MG TABS [id number] Yes Take 600 mg by mouth daily with breakfast. [provider] Taking Active Self  cholecalciferol (VITAMIN D) 1000 units tablet [id number] No Take 2,000 Units by mouth 2 (two) times daily.  Patient not taking: Reported on 01/13/2022   [provider] Not Taking Active Self  DULoxetine (CYMBALTA) 60 MG capsule [id number] Yes TAKE 1 CAPSULE BY MOUTH  DAILY Venia Carbon, MD Taking Active Self  fluticasone (FLONASE) 50 MCG/ACT nasal spray [id number] No Place 1 spray into both nostrils daily as needed for allergies.  Patient not taking: Reported on 01/13/2022   [provider] Not Taking Active Self  furosemide (LASIX) 40 MG tablet [id number] Yes Take 1 tablet (40 mg total) by mouth daily. Sidney Ace, MD Taking Active Self  glucose blood (ONETOUCH VERIO) test strip [id number] Yes Use to check blood sugar once a day. E11.9 Venia Carbon, MD Taking Active Self  levothyroxine (SYNTHROID) 200 MCG tablet [id number] Yes TAKE 1 TABLET BY MOUTH  DAILY BEFORE BREAKFAST Shamleffer, Melanie Crazier, MD Taking Active Self   Magnesium 500 MG TABS [id number] No Take 500 mg by mouth daily.  Patient not taking: Reported on 01/13/2022   [provider] Not Taking Active Self  metFORMIN (GLUCOPHAGE-XR) 500 MG 24 hr tablet [id number] Yes TAKE 1 TABLET BY MOUTH  DAILY WITH BREAKFAST Venia Carbon, MD Taking Active Self  montelukast (SINGULAIR) 10 MG tablet [id number] Yes TAKE  1 TABLET BY MOUTH AT  BEDTIME Venia Carbon, MD Taking Active Self  [facility] LANCETS [place] [id number] Yes 1 each by Does not apply route daily. Use to check blood sugar once a day. E11.9 Venia Carbon, MD Taking Active Self  propranolol (INDERAL) 20 MG tablet [id number] Yes Take 1 tablet (20 mg total) by mouth 2 (two) times daily. Sidney Ace, MD Taking Active Self  rOPINIRole (REQUIP) 3 MG tablet [id number] Yes TAKE ONE TABLET BY MOUTH AT BEDTIME Venia Carbon, MD Taking Active Self  silver sulfADIAZINE (SILVADENE) 1 % cream [id number] Yes Apply 1 application topically daily. Venia Carbon, MD Taking Active Self  spironolactone (ALDACTONE) 25 MG tablet [id number] Yes Take 1 tablet (25 mg total) by mouth daily. Sidney Ace, MD Taking Active Self  Turmeric Curcumin 500 MG CAPS [id number] No Take 500 mg by mouth at bedtime.   Patient not taking: Reported on 01/13/2022   [provider] Not Taking Active Self           Med Note Vinie Sill May 19, 2017 10:06 AM)    vitamin B-12 (CYANOCOBALAMIN) 1000 MCG tablet [id number] No Take 1,000 mcg by mouth daily.  Patient not taking: Reported on 01/13/2022   [provider] Not Taking Active Self  vitamin C (ASCORBIC ACID) 500 MG tablet [id number] No Take 500 mg by mouth daily.  Patient not taking: Reported on 01/13/2022   [provider] Not Taking Active Self  Vitamin E 45 MG (100 UNIT) CAPS [id number] No Take 100 Units by mouth daily.  Patient not taking: Reported on 01/13/2022   [provider] Not Taking Active Self   zinc gluconate 50 MG tablet [id number] No Take 50 mg by mouth at bedtime.  Patient not taking: Reported on 01/13/2022   [provider] Not Taking Active Self            Patient Active Problem List   Diagnosis Date Noted   Left leg swelling 01/13/2022   Chronic diastolic heart failure ([place]) 12/11/2021   Acute on chronic respiratory failure with hypoxia ([place]) 11/19/2021   Chronic kidney disease, stage 3a ([place]) 11/19/2021   Multiple fractures of ribs, left side, initial encounter for closed fracture 11/19/2021   Cor pulmonale ([place]) 11/19/2021   Edema 09/10/2021   Pulmonary hypertension ([place]) 09/10/2021   Venous stasis ulcer of right calf ([place]) 09/10/2021   Aortic atherosclerosis ([place]) 01/11/2021   Hashimoto's thyroiditis 01/10/2021   Hypothyroidism 07/06/2020   Chronic hypoxemic respiratory failure ([place]) 07/06/2020   Prediabetes 09/06/2018   History of melanoma 07/22/2018   Aneurysm of thoracic aorta 04/07/2018   Mood disorder ([place]) 03/02/2018   Status post total bilateral knee replacement 12/03/2017   Preventative health care 08/20/2017   Urinary incontinence 08/20/2017   Advance directive discussed with patient 08/20/2017   Status post reverse total shoulder replacement, right 05/28/2017   Obesity, Class III, BMI 40-49.9 (morbid obesity) ([place]) 05/22/2016   COPD (chronic obstructive pulmonary disease) ([place]) 04/16/2016   Generalized weakness 04/16/2016   Hypertension    Tremor, essential    OSA (obstructive sleep apnea)    RLS (restless legs syndrome)    Raynaud disease    Osteoarthrosis, generalized, multiple joints     Immunization History  Administered Date(s) Administered   Fluad Quad(high Dose 65+) 09/12/2019, 01/11/2021, 10/29/2021   Influenza,inj,Quad PF,6+ Mos 09/06/2018   Influenza-Unspecified 10/27/2017   Pneumococcal Conjugate-13 04/16/2016   Pneumococcal Polysaccharide-23 08/20/2017  Conditions to be addressed/monitored:  Diabetes, COPD, and  Hypothyroidism  There are no care plans that you recently modified to display for this patient.    Medication Assistance:  Patient reports cost of Symbicort is high but states she is likely not eligible for cost assistance at this time. This may change at start of the year, 2023. She will contact if interested.  Compliance/Adherence/Medication fill history: Care Gaps: Mammogram DEXA  Star-Rating Drugs: Metformin - Ord 100%  Patient's preferred pharmacy is: [facility] [place] [place] [address] [place] [address] [address] Phone: [phone] Fax: [phone]  Uses pill box? unknown Pt endorses 100% compliance  Care Plan and Follow Up Patient Decision:  Patient agrees to Care Plan and Follow-up.  Follow Up Plan: {CM FOLLOW UP ELFY:10175}  Charlene Brooke, PharmD, BCACP Clinical Pharmacist [facility] [phone]     Current Barriers:  Chief complaint - chronic fatigue   Pharmacist Clinical Goal(s):  Patient will contact provider office for questions/concerns as evidenced notation of same in electronic health record through collaboration with PharmD and provider.   Interventions: 1:1 collaboration with Venia Carbon, MD regarding development and update of comprehensive plan of care as evidenced by provider attestation and co-signature Inter-disciplinary care team collaboration (see longitudinal plan of care) Comprehensive medication review performed; medication list updated in electronic medical record  Hypertension /Heart Failure (BP goal <130/80) -{US controlled/uncontrolled:25276} -Last ejection fraction: >75% (Date: 11/20/21) -HF type: Diastolic -NYHA Class: IV (significant limitation of activity / dyspnea at rest) -Current treatment: Spironolactone 25 mg daily Proparnolol 20 mg BID Furosemide 40 mg daily -Medications previously tried: amlodipine, HCTZ   -Current home readings: *** -Current dietary habits: *** -Current exercise habits: *** -{ACTIONS;DENIES/REPORTS:21021675::"Denies"} hypotensive/hypertensive symptoms -Educated on {CCM BP Counseling:25124} -Counseled to monitor BP at home ***, document, and provide log at future appointments -{CCMPHARMDINTERVENTION:25122}   Hyperlipidemia: (LDL goal < 70) -{US controlled/uncontrolled:25276} -Hx aortic atherosclerosis -Current treatment: *** -Medications previously tried: rosuvastatin  -Current dietary patterns: *** -Current exercise habits: *** -Educated on {CCM HLD Counseling:25126} -{CCMPHARMDINTERVENTION:25122}  Diabetes (A1c goal <7%) -Not ideally controlled - A1c 6.5 > 7.2% -Current medications: Metformin 500 mg XR - 1 tablet daily -Medications previously tried: none reported  -Current home glucose readings - does not monitor routinely fasting glucose: 79 today  post prandial glucose: n/a -Denies hypoglycemic/hyperglycemic symptoms -Current exercise: minimal, fatigue, on oxgyen -Educated on A1c and blood sugar goals; Prevention and management of hypoglycemic episodes; -Counseled to check feet daily and get yearly eye exams -Recommended let us know if BG is running less than 90 consistently or s/s of hypoglycemia.  COPD (Goal: control symptoms and prevent exacerbations) -Not ideally controlled - Pt reports she was given Trelegy samples, but has not started yet due to finishing out Symbicort first. Insurance did not pay for the Trelegy, she has 1 month sample. She sees Dr. Vella Kohler at [facility] -Followed by pulmonology -Social History: quit smoking 30+ years ago.  -She does not have an O2 monitor.  -Current treatment  Symbicort 160-4/5 mcg/act - Inhale 2 puffs twice a day Albuterol - Inhale 2 puffs every 4-6 hours Montelukast - daily Allegra - daily (not taking currently) Supplemental oxygen -Medications previously tried: none reported -Exacerbations requiring  treatment in last 6 months: none  -Patient reports consistent use of maintenance inhaler - Symbicort  -Frequency of rescue inhaler use: using albuterol nebulizer every morning this summer (heat). Albuterol inhaler  as needed in addition. She limits use. -Counseled on Benefits of consistent maintenance inhaler use Differences between maintenance and rescue inhalers -Recommended go ahead and startTrelegy to see if this helps her symptoms. If she does well on Trelegy and cost is a concern, may apply for PAP. Start back on daily allergy medicine - Allegra  - or try switching to Xyzal.  Check with pulmonology about a follow up visit (DUE) and oxygen monitor.   Hypothyroidism (Goal: TSH/T4 WNL) -Controlled - per TSH, T4, but patient concerns her symptoms are not controlled - fatigue. -We discussed levothyroxine more reliable and effective than natural thyroid hormone in most patients, but could consider other treatment options. She would like to get a second endo opinion. Will request new referral. -Current treatment  Levothyroxine 200 mcg - 1 tablet daily before breakfast -Medications previously tried: switched from Synthroid 12/2019 to generic due to cost  -Collaborated with PCP - Patient request for endo referral to [facility].   Depression/Anxiety (Goal: ***) -{US controlled/uncontrolled:25276} -Current treatment: Duloxetine Alprazolam -Medications previously tried/failed: *** -PHQ9: *** -GAD7: *** -Connected with *** for mental health support -Educated on {CCM mental health counseling:25127} -{CCMPHARMDINTERVENTION:25122}  Patient Goals/Self-Care Activities Patient will:  - follow up with specialists as recommended

## 2022-02-12 NOTE — Telephone Encounter (Signed)
°  Chronic Care Management   Outreach Note  02/12/2022 Name: Kari Medina MRN: [id number] DOB: 09/28/1946  Referred by: Venia Carbon, MD  Patient had a phone appointment scheduled with clinical pharmacist today.  An unsuccessful telephone outreach was attempted today. The patient was referred to the pharmacist for assistance with medications, care management and care coordination.   Patient will NOT be penalized in any way for missing a CCM appointment. The no-show fee does not apply.  If possible, a message was left to return call to: [phone] or to [facility].  Charlene Brooke, PharmD, BCACP Clinical Pharmacist [facility] at [facility] [phone]

## 2022-02-19 DIAGNOSIS — I445 Left posterior fascicular block: Secondary | ICD-10-CM | POA: Diagnosis not present

## 2022-02-19 DIAGNOSIS — Z79899 Other long term (current) drug therapy: Secondary | ICD-10-CM | POA: Diagnosis not present

## 2022-02-19 DIAGNOSIS — I071 Rheumatic tricuspid insufficiency: Secondary | ICD-10-CM | POA: Diagnosis not present

## 2022-02-19 DIAGNOSIS — I2722 Pulmonary hypertension due to left heart disease: Secondary | ICD-10-CM | POA: Diagnosis not present

## 2022-02-19 DIAGNOSIS — R9431 Abnormal electrocardiogram [ECG] [EKG]: Secondary | ICD-10-CM | POA: Diagnosis not present

## 2022-02-19 DIAGNOSIS — J811 Chronic pulmonary edema: Secondary | ICD-10-CM | POA: Diagnosis not present

## 2022-02-19 DIAGNOSIS — I272 Pulmonary hypertension, unspecified: Secondary | ICD-10-CM | POA: Diagnosis not present

## 2022-02-19 DIAGNOSIS — R0602 Shortness of breath: Secondary | ICD-10-CM | POA: Diagnosis not present

## 2022-02-19 DIAGNOSIS — I517 Cardiomegaly: Secondary | ICD-10-CM | POA: Diagnosis not present

## 2022-02-21 ENCOUNTER — Telehealth: Payer: Self-pay | Admitting: Internal Medicine

## 2022-02-21 MED ORDER — ONETOUCH VERIO VI STRP: ORAL_STRIP | 3 refills | Status: DC

## 2022-02-21 NOTE — Telephone Encounter (Signed)
?  Encourage patient to contact the pharmacy for refills or they can request refills through [facility] ? ?LAST APPOINTMENT DATE:  Please schedule appointment if longer than 1 year ? ?NEXT APPOINTMENT DATE: ? ?MEDICATION:glucose blood (ONETOUCH VERIO) test strip ? ?Is the patient out of medication?  ? ?PHARMACY:[facility] in [place] ? ?Let patient know to contact pharmacy at the end of the day to make sure medication is ready. ? ?Please notify patient to allow 48-72 hours to process ? ?CLINICAL FILLS OUT ALL BELOW:  ? ?LAST REFILL: ? ?QTY: ? ?REFILL DATE: ? ? ? ?OTHER COMMENTS:  ? ? ?Okay for refill? ? ?Please advise ? ? ?  ?

## 2022-02-21 NOTE — Addendum Note (Signed)
Addended by: Pilar Grammes on: 02/21/2022 04:18 PM ? ? Modules accepted: Orders ? ?

## 2022-02-21 NOTE — Telephone Encounter (Signed)
Rx sent electronically.  

## 2022-02-27 ENCOUNTER — Telehealth: Payer: Self-pay | Admitting: Internal Medicine

## 2022-02-27 DIAGNOSIS — J9611 Chronic respiratory failure with hypoxia: Secondary | ICD-10-CM | POA: Diagnosis not present

## 2022-02-27 DIAGNOSIS — J9811 Atelectasis: Secondary | ICD-10-CM | POA: Diagnosis not present

## 2022-02-27 DIAGNOSIS — I130 Hypertensive heart and chronic kidney disease with heart failure and stage 1 through stage 4 chronic kidney disease, or unspecified chronic kidney disease: Secondary | ICD-10-CM | POA: Diagnosis not present

## 2022-02-27 DIAGNOSIS — E1122 Type 2 diabetes mellitus with diabetic chronic kidney disease: Secondary | ICD-10-CM | POA: Diagnosis not present

## 2022-02-27 DIAGNOSIS — J449 Chronic obstructive pulmonary disease, unspecified: Secondary | ICD-10-CM | POA: Diagnosis not present

## 2022-02-27 DIAGNOSIS — I5032 Chronic diastolic (congestive) heart failure: Secondary | ICD-10-CM | POA: Diagnosis not present

## 2022-02-27 MED ORDER — SILVER SULFADIAZINE 1 % EX CREA: 1.0000 "application " | TOPICAL_CREAM | Freq: Every day | CUTANEOUS | 0 refills | Status: DC

## 2022-02-27 NOTE — Telephone Encounter (Signed)
?  Encourage patient to contact the pharmacy for refills or they can request refills through [facility] ? ?LAST APPOINTMENT DATE:  Please schedule appointment if longer than 1 year ? ?NEXT APPOINTMENT DATE: ? ?MEDICATION:silver sulfADIAZINE (SILVADENE) 1 % cream ? ?Is the patient out of medication?  ? ?PHARMACY:[facility] [id number] Lorina Rabon,   ? ?Let patient know to contact pharmacy at the end of the day to make sure medication is ready. ? ?Please notify patient to allow 48-72 hours to process ? ?CLINICAL FILLS OUT ALL BELOW:  ? ?LAST REFILL: ? ?QTY: ? ?REFILL DATE: ? ? ? ?OTHER COMMENTS:  ? ? ?Okay for refill? ? ?Please advise ? ? ? ? ?

## 2022-02-27 NOTE — Telephone Encounter (Signed)
Rx sent electronically.  

## 2022-03-03 DIAGNOSIS — M159 Polyosteoarthritis, unspecified: Secondary | ICD-10-CM | POA: Diagnosis not present

## 2022-03-03 DIAGNOSIS — E1122 Type 2 diabetes mellitus with diabetic chronic kidney disease: Secondary | ICD-10-CM | POA: Diagnosis not present

## 2022-03-03 DIAGNOSIS — G4733 Obstructive sleep apnea (adult) (pediatric): Secondary | ICD-10-CM | POA: Diagnosis not present

## 2022-03-03 DIAGNOSIS — N1831 Chronic kidney disease, stage 3a: Secondary | ICD-10-CM | POA: Diagnosis not present

## 2022-03-03 DIAGNOSIS — S2242XD Multiple fractures of ribs, left side, subsequent encounter for fracture with routine healing: Secondary | ICD-10-CM | POA: Diagnosis not present

## 2022-03-03 DIAGNOSIS — K760 Fatty (change of) liver, not elsewhere classified: Secondary | ICD-10-CM | POA: Diagnosis not present

## 2022-03-03 DIAGNOSIS — M797 Fibromyalgia: Secondary | ICD-10-CM | POA: Diagnosis not present

## 2022-03-03 DIAGNOSIS — G2581 Restless legs syndrome: Secondary | ICD-10-CM | POA: Diagnosis not present

## 2022-03-03 DIAGNOSIS — F410 Panic disorder [episodic paroxysmal anxiety] without agoraphobia: Secondary | ICD-10-CM | POA: Diagnosis not present

## 2022-03-03 DIAGNOSIS — I130 Hypertensive heart and chronic kidney disease with heart failure and stage 1 through stage 4 chronic kidney disease, or unspecified chronic kidney disease: Secondary | ICD-10-CM | POA: Diagnosis not present

## 2022-03-03 DIAGNOSIS — E1151 Type 2 diabetes mellitus with diabetic peripheral angiopathy without gangrene: Secondary | ICD-10-CM | POA: Diagnosis not present

## 2022-03-03 DIAGNOSIS — F329 Major depressive disorder, single episode, unspecified: Secondary | ICD-10-CM | POA: Diagnosis not present

## 2022-03-03 DIAGNOSIS — I7300 Raynaud's syndrome without gangrene: Secondary | ICD-10-CM | POA: Diagnosis not present

## 2022-03-03 DIAGNOSIS — I499 Cardiac arrhythmia, unspecified: Secondary | ICD-10-CM | POA: Diagnosis not present

## 2022-03-03 DIAGNOSIS — G8929 Other chronic pain: Secondary | ICD-10-CM | POA: Diagnosis not present

## 2022-03-03 DIAGNOSIS — W19XXXD Unspecified fall, subsequent encounter: Secondary | ICD-10-CM | POA: Diagnosis not present

## 2022-03-03 DIAGNOSIS — Z9181 History of falling: Secondary | ICD-10-CM | POA: Diagnosis not present

## 2022-03-03 DIAGNOSIS — J9811 Atelectasis: Secondary | ICD-10-CM | POA: Diagnosis not present

## 2022-03-03 DIAGNOSIS — J9611 Chronic respiratory failure with hypoxia: Secondary | ICD-10-CM | POA: Diagnosis not present

## 2022-03-03 DIAGNOSIS — J449 Chronic obstructive pulmonary disease, unspecified: Secondary | ICD-10-CM | POA: Diagnosis not present

## 2022-03-03 DIAGNOSIS — E785 Hyperlipidemia, unspecified: Secondary | ICD-10-CM | POA: Diagnosis not present

## 2022-03-03 DIAGNOSIS — G250 Essential tremor: Secondary | ICD-10-CM | POA: Diagnosis not present

## 2022-03-03 DIAGNOSIS — E039 Hypothyroidism, unspecified: Secondary | ICD-10-CM | POA: Diagnosis not present

## 2022-03-03 DIAGNOSIS — I5032 Chronic diastolic (congestive) heart failure: Secondary | ICD-10-CM | POA: Diagnosis not present

## 2022-03-04 ENCOUNTER — Telehealth: Payer: Self-pay | Admitting: *Deleted

## 2022-03-04 NOTE — Chronic Care Management (AMB) (Signed)
?  Care Management  ? ?Outreach Note ? ?03/04/2022 ?Name: Kari Medina MRN: [id number] DOB: Aug 21, 1946 ? ?Referred by: Venia Carbon, MD ?Reason for referral : Care Coordination (Outreach to schedule initial with RNCM (BCBS list)) ? ? ?An unsuccessful telephone outreach was attempted today. The patient was referred to the case management team for assistance with care management and care coordination.  ? ?Follow Up Plan:  ?A HIPAA compliant phone message was left for the patient providing contact information and requesting a return call.  ? ?Kristjan Derner, CCMA ?Care Guide, Embedded Care Coordination ?[place]  Care Management  ?Direct Dial: [phone] ? ? ?

## 2022-03-05 ENCOUNTER — Other Ambulatory Visit: Payer: Self-pay

## 2022-03-05 DIAGNOSIS — J449 Chronic obstructive pulmonary disease, unspecified: Secondary | ICD-10-CM | POA: Diagnosis not present

## 2022-03-05 DIAGNOSIS — I5032 Chronic diastolic (congestive) heart failure: Secondary | ICD-10-CM | POA: Diagnosis not present

## 2022-03-05 DIAGNOSIS — J9611 Chronic respiratory failure with hypoxia: Secondary | ICD-10-CM | POA: Diagnosis not present

## 2022-03-05 DIAGNOSIS — J9811 Atelectasis: Secondary | ICD-10-CM | POA: Diagnosis not present

## 2022-03-05 DIAGNOSIS — E1122 Type 2 diabetes mellitus with diabetic chronic kidney disease: Secondary | ICD-10-CM | POA: Diagnosis not present

## 2022-03-05 DIAGNOSIS — I130 Hypertensive heart and chronic kidney disease with heart failure and stage 1 through stage 4 chronic kidney disease, or unspecified chronic kidney disease: Secondary | ICD-10-CM | POA: Diagnosis not present

## 2022-03-05 MED ORDER — BUDESONIDE-FORMOTEROL FUMARATE 160-4.5 MCG/ACT IN AERO: 2.0000 | INHALATION_SPRAY | Freq: Two times a day (BID) | RESPIRATORY_TRACT | 1 refills | Status: DC

## 2022-03-10 ENCOUNTER — Encounter: Payer: Self-pay | Admitting: Internal Medicine

## 2022-03-10 ENCOUNTER — Telehealth: Payer: Self-pay

## 2022-03-10 ENCOUNTER — Ambulatory Visit: Payer: Medicare Other | Admitting: Internal Medicine

## 2022-03-10 NOTE — Telephone Encounter (Signed)
It does sound like she needs more aggressive diuretic therapy. ?Will assess at today's visit ?

## 2022-03-10 NOTE — Telephone Encounter (Signed)
[facility] - Client ?TELEPHONE ADVICE RECORD ?[facility]? ?Patient ?Name: ?Kari MILE ?Medina ?Gender: Female ?DOB: 1946/11/03 ?Age: 76 Y 7 M 12 D ?Return ?Phone ?Number: ?[id number] ?(Primary), ?[id number] ?(Secondary) ?Address: ?City/ ?State/ ?Zip: ?[place] ? [address] ?Client [facility] - Client ?Client Site [facility] ?Provider [name]- MD ?Contact Type Call ?Who Is Calling Patient / Member / Family / Caregiver ?Call Type Triage / Clinical ?Relationship To Patient Self ?Return Phone Number [phone] (Primary) ?Chief Complaint Sores ?Reason for Call Symptomatic / Request for Health Information ?Initial Comment Caller states has weeping in both legs; sores; ?DSS cream not helping a lot; Began in Dec; has ?pulmonary hypertension; back of left leg has a ?long place like skin opened; ?Translation No ?Nurse Assessment ?Nurse: Doyle Askew, RN, Beth Date/Time [name] Time): 03/10/2022 9:13:17 AM ?Confirm and document reason for call. If ?symptomatic, describe symptoms. ?---Caller states has small amount of weeping in both ?legs; sores; DSS cream not helping a lot; began in Dec; ?has pulmonary hypertension; back of left leg has a long ?place like skin opened. Caller states right leg is more ?red and sore than left leg. ?Does the patient have any new or worsening ?symptoms? ---Yes ?Will a triage be completed? ---Yes ?Related visit to physician within the last 2 weeks? ---Yes ?Does the PT have any chronic conditions? (i.e. ?diabetes, asthma, this includes High risk factors for ?pregnancy, etc.) ?---Yes ?List chronic conditions. ---pulmonary HTN, diabetes ?Is this a behavioral health or substance abuse call? ---No ?Guidelines ?Guideline Title Affirmed Question Affirmed Notes Nurse Date/Time (Eastern ?Time) ?Sores [1] Small red streak ?or spreading redness ?(< 2 inches or 5 cm) ?AND [2] no fever ?Doyle Askew, RN, [place] 03/10/2022 9:15:16 ?AM ?Disp.  Time (Eastern ?Time) Disposition Final User ?PLEASE NOTE: All timestamps contained within this report are represented as [place] Standard Time. ?CONFIDENTIALTY NOTICE: This fax transmission is intended only for the addressee. It contains information that is legally privileged, confidential or ?otherwise protected from use or disclosure. If you are not the intended recipient, you are strictly prohibited from reviewing, disclosing, copying using ?or disseminating any of this information or taking any action in reliance on or regarding this information. If you have received this fax in error, please ?notify us immediately by telephone so that we can arrange for its return to [place]. Phone: [phone], Toll-Free: [phone], Fax: [phone] ?Page: 2 of 2 ?Call Id: [id number] ?03/10/2022 9:21:34 AM See PCP within 24 Hours Yes Doyle Askew, Therapist, sports, [place] ?Caller Disagree/Comply Comply ?Caller Understands Yes ?PreDisposition Call Doctor ?Care Advice Given Per Guideline ?SEE PCP WITHIN 24 HOURS: * IF OFFICE WILL BE OPEN: You need to be examined within the next 24 hours. Call your ?doctor (or NP/PA) when the office opens and make an appointment. ANTIBIOTIC OINTMENT: * Put a small amount of antibiotic ?ointment on the infected area 3 times per day. CARE ADVICE given per Sores (Adult) guideline. * You become worse * Fever ?occurs CALL BACK IF: ?Comments ?User: Melene Muller, RN Date/Time [name] Time): 03/10/2022 9:20:56 AM ?Warm transferred caller to office = appt scheduled for today at 4pm (only appt available today) ?Referrals ?REFERRED TO PCP OFFIC ?

## 2022-03-10 NOTE — Telephone Encounter (Signed)
Patient called back at 2:30pm and said that she doesn't feel up to coming in. ? ?We did not cancel the appointment yet and are referring her back to triage. ?

## 2022-03-10 NOTE — Telephone Encounter (Signed)
Pt already has appt scheduled with Dr Silvio Pate on 03/10/22 at 4pm. Sending note to Dr Silvio Pate and Larene Beach CMA. ?

## 2022-03-10 NOTE — Telephone Encounter (Signed)
Noted ?Hopefully she can make it in tomorrow ?

## 2022-03-10 NOTE — Telephone Encounter (Signed)
I spoke with pt at great length trying to encourage her to keep appt today with Dr Silvio Pate. Pt said she is too weak to come today and I cannot force her to come in to office. I advised pt I was not trying to force her to do anything but I was concerned about her condition. Pt cannot take her BP because she is in bathroom. Pt has not taken BP today. Pt said she would come in on 03/11/22. I spoke with Dr Silvio Pate who said pt needs to be seen but if she refuses appt for today to schedule pt on 03/11/22 at 11:15. Appt changed to 03/11/22 at 11:15 with UC & ED precautions and pt voiced understanding. Sending note to Dr Silvio Pate and Larene Beach CMA. ?

## 2022-03-10 NOTE — Telephone Encounter (Signed)
Error

## 2022-03-11 ENCOUNTER — Other Ambulatory Visit: Payer: Self-pay

## 2022-03-11 ENCOUNTER — Ambulatory Visit (INDEPENDENT_AMBULATORY_CARE_PROVIDER_SITE_OTHER): Payer: Medicare Other | Admitting: Internal Medicine

## 2022-03-11 ENCOUNTER — Encounter: Payer: Self-pay | Admitting: Internal Medicine

## 2022-03-11 VITALS — BP 108/72 | HR 86 | Temp 97.1°F | Ht 64.0 in | Wt 219.0 lb

## 2022-03-11 DIAGNOSIS — L97211 Non-pressure chronic ulcer of right calf limited to breakdown of skin: Secondary | ICD-10-CM | POA: Diagnosis not present

## 2022-03-11 DIAGNOSIS — I872 Venous insufficiency (chronic) (peripheral): Secondary | ICD-10-CM | POA: Diagnosis not present

## 2022-03-11 NOTE — Progress Notes (Signed)
? ?Subjective:  ? ? Patient ID: Kari Medina, female    DOB: 05-12-1946, 76 y.o.   MRN: [id number] ? ?HPI ?Here with husband due to non healing leg ulcers ? ?Has had some sores on legs even since December ?Had some weeping back then ?Skin seems to break easily ?Got concerned yesterday---they just won't heal ?Using silvadene cream daily---does seem to help ?They do hurt--especially lowest one on right calf ? ?Current Outpatient Medications on File Prior to Visit  ?Medication Sig Dispense Refill  ? acetaminophen (TYLENOL) 650 MG CR tablet Take 650 mg by mouth every 8 (eight) hours as needed for pain.    ? albuterol (PROVENTIL HFA) 108 (90 Base) MCG/ACT inhaler Inhale 2 puffs into the lungs every 6 (six) hours as needed for wheezing or shortness of breath. 18 g 2  ? ALPRAZolam (XANAX) 0.25 MG tablet Take 1 tablet (0.25 mg total) by mouth 2 (two) times daily as needed for anxiety. 30 tablet 0  ? aspirin 325 MG EC tablet Take 162.5 mg by mouth daily.    ? budesonide-formoterol (SYMBICORT) 160-4.5 MCG/ACT inhaler Inhale 2 puffs into the lungs 2 (two) times daily. 3 each 1  ? calcium carbonate (OS-CAL) 600 MG TABS Take 600 mg by mouth daily with breakfast.    ? DULoxetine (CYMBALTA) 60 MG capsule TAKE 1 CAPSULE BY MOUTH  DAILY 90 capsule 3  ? furosemide (LASIX) 40 MG tablet Take 1 tablet (40 mg total) by mouth daily. 30 tablet 1  ? glucose blood (ONETOUCH VERIO) test strip Use to check blood sugar once a day. E11.9 100 each 3  ? levothyroxine (SYNTHROID) 200 MCG tablet TAKE 1 TABLET BY MOUTH  DAILY BEFORE BREAKFAST 90 tablet 3  ? metFORMIN (GLUCOPHAGE-XR) 500 MG 24 hr tablet TAKE 1 TABLET BY MOUTH  DAILY WITH BREAKFAST 90 tablet 3  ? montelukast (SINGULAIR) 10 MG tablet TAKE 1 TABLET BY MOUTH AT  BEDTIME 90 tablet 3  ? ONETOUCH DELICA LANCETS FINE MISC 1 each by Does not apply route daily. Use to check blood sugar once a day. E11.9 100 each 3  ? rOPINIRole (REQUIP) 3 MG tablet TAKE ONE TABLET BY MOUTH AT BEDTIME 90  tablet 3  ? silver sulfADIAZINE (SILVADENE) 1 % cream Apply 1 application. topically daily. 50 g 0  ? spironolactone (ALDACTONE) 25 MG tablet Take 1 tablet (25 mg total) by mouth daily. 30 tablet 1  ? propranolol (INDERAL) 20 MG tablet Take 1 tablet (20 mg total) by mouth 2 (two) times daily. 60 tablet 1  ? ?No current facility-administered medications on file prior to visit.  ? ? ?Allergies  ?Allergen Reactions  ? Latex Hives  ?  Tape and bandaids only  ? Nickel Dermatitis  ? Pramipexole Other (See Comments)  ?  Caused hallucinations, says she can take name brand.  ? Prednisone   ?  Makes her "feel crazy" - can take low dosage   ? Topiramate Other (See Comments)  ?  "spaced out"  ? Citalopram Anxiety  ? Paroxetine Hcl Anxiety  ? Sertraline Anxiety  ? ? ?Past Medical History:  ?Diagnosis Date  ? Asthma   ? Chronic hypoxemic respiratory failure (HCC)   ? uses O2 with exertion and with CPAP  ? COPD (chronic obstructive pulmonary disease) ([facility])   ? Depression   ? Diabetes mellitus without complication ([facility])   ? Dyspnea   ? doe  ? Dysrhythmia   ? extra beat  ? Fatty liver   ?  Fibromyalgia   ? Generalized osteoarthritis of multiple sites   ? History of hiatal hernia   ? Hypertension   ? Hypothyroidism   ? Melanoma ([facility]) 07/2018  ? Right leg  ? OSA (obstructive sleep apnea)   ? Pain   ? chronic ruq and back pain  ? Panic attacks   ? PONV (postoperative nausea and vomiting)   ? after thyroidectomy  ? Raynaud disease   ? RLS (restless legs syndrome)   ? Spleen absent   ? TOLD ABSENT THEN TOLD DOES HAVE SPLEEN. PATIENT IS UNCERTAIN  ? Tremor, essential   ? Wears dentures   ? full upper and lower  ? ? ?Past Surgical History:  ?Procedure Laterality Date  ? CATARACT EXTRACTION W/PHACO Right 03/04/2021  ? Procedure: CATARACT EXTRACTION PHACO AND INTRAOCULAR LENS PLACEMENT ([facility]) RIGHT DIABETIC 3.98 00:33.2;  Surgeon: Eulogio Bear, MD;  Location: [facility];  Service: Ophthalmology;  Laterality: Right;  Diabetic -  oral meds  ? CATARACT EXTRACTION W/PHACO Left 03/18/2021  ? Procedure: CATARACT EXTRACTION PHACO AND INTRAOCULAR LENS PLACEMENT ([facility]) LEFT;  Surgeon: Eulogio Bear, MD;  Location: [facility];  Service: Ophthalmology;  Laterality: Left;  2.96 ?0:27.9  ? CHOLECYSTECTOMY    ? DILATATION & CURETTAGE/HYSTEROSCOPY WITH MYOSURE N/A 11/10/2018  ? Procedure: DILATATION & CURETTAGE/HYSTEROSCOPY WITH MYOSURE/MYOMECTOMY;  Surgeon: Aletha Halim, MD;  Location: [facility];  Service: Gynecology;  Laterality: N/A;  possible myosure.  Please use myosure scope, do not open myosure blades but have in the room  ? JOINT REPLACEMENT Bilateral   ? 2008/2011  ? MELANOMA EXCISION  07/2018  ? REVERSE SHOULDER ARTHROPLASTY Right 05/28/2017  ? Procedure: REVERSE SHOULDER ARTHROPLASTY;  Surgeon: Corky Mull, MD;  Location: [facility];  Service: Orthopedics;  Laterality: Right;  ? RHINOPLASTY  1972  ? RIGHT HEART CATH N/A 12/27/2021  ? Procedure: RIGHT HEART CATH;  Surgeon: Andrez Grime, MD;  Location: [facility];  Service: Cardiovascular;  Laterality: N/A;  ? THYROIDECTOMY  2006  ? TOTAL KNEE ARTHROPLASTY    ? bilateral  ? ? ?Family History  ?Problem Relation Age of Onset  ? Osteoarthritis Mother   ? Diabetes Mother   ? Cirrhosis Mother   ? Cancer Father   ? Kidney cancer Father   ? Bladder Cancer Father   ? Heart disease Brother   ?     stents in 1 brother  ? Breast cancer Neg Hx   ? ? ?Social History  ? ?Socioeconomic History  ? Marital status: Married  ?  Spouse name: Not on file  ? Number of children: 1  ? Years of education: Not on file  ? Highest education level: Not on file  ?Occupational History  ? Occupation: Neurosurgeon  ?  Comment: Retired  ?Tobacco Use  ? Smoking status: Former  ?  Packs/day: 0.25  ?  Years: 5.00  ?  Pack years: 1.25  ?  Types: Cigarettes  ?  Quit date: 12/22/1988  ?  Years since quitting: 33.2  ? Smokeless tobacco: Never  ?Vaping Use  ? Vaping Use: Never used   ?Substance and Sexual Activity  ? Alcohol use: No  ? Drug use: No  ? Sexual activity: Not on file  ?Other Topics Concern  ? Not on file  ?Social History Narrative  ? 1 daughter  ?   ? Has living will  ? Husband has health care POA. Alternate would be daughter Judson Roch  ?  Would allow resuscitation but no prolonged machines  ? Not sure about feeding tubes  ? ?Social Determinants of Health  ? ?Financial Resource Strain: Not on file  ?Food Insecurity: Not on file  ?Transportation Needs: Not on file  ?Physical Activity: Not on file  ?Stress: Not on file  ?Social Connections: Not on file  ?Intimate Partner Violence: Not on file  ? ?Review of Systems ?Edema is somewhat better ?No fever ?   ?Objective:  ? Physical Exam ?Constitutional:   ?   Appearance: Normal appearance.  ?Musculoskeletal:  ?   Comments: 1+ pitting edema---better than in the past ?  ?Skin: ?   Comments: Feet are cool ?1+ pulse on right---faint on left ? ?35m ulcer with central non healing eschar---right calf ?267mulcer with eschar above that one ? ?Two 3-86m59mlcers on right calf--also with non healing eschar ? ?None are inflamed or infected  ?Neurological:  ?   Mental Status: She is alert.  ?  ? ? ? ? ?   ?Assessment & Plan:  ? ?

## 2022-03-11 NOTE — Assessment & Plan Note (Signed)
Non healing ?3 smaller non healing ulcers as well ?Seems to have good arterial circulation ?Leg edema is better--now with Rx for pulmonary HTN ? ?Will refer to wound clinic ?Continue the silvadene for now ?

## 2022-03-12 DIAGNOSIS — I5032 Chronic diastolic (congestive) heart failure: Secondary | ICD-10-CM | POA: Diagnosis not present

## 2022-03-12 DIAGNOSIS — J449 Chronic obstructive pulmonary disease, unspecified: Secondary | ICD-10-CM | POA: Diagnosis not present

## 2022-03-12 DIAGNOSIS — I130 Hypertensive heart and chronic kidney disease with heart failure and stage 1 through stage 4 chronic kidney disease, or unspecified chronic kidney disease: Secondary | ICD-10-CM | POA: Diagnosis not present

## 2022-03-12 DIAGNOSIS — E1122 Type 2 diabetes mellitus with diabetic chronic kidney disease: Secondary | ICD-10-CM | POA: Diagnosis not present

## 2022-03-12 DIAGNOSIS — J9811 Atelectasis: Secondary | ICD-10-CM | POA: Diagnosis not present

## 2022-03-12 DIAGNOSIS — J9611 Chronic respiratory failure with hypoxia: Secondary | ICD-10-CM | POA: Diagnosis not present

## 2022-03-13 ENCOUNTER — Other Ambulatory Visit: Payer: Self-pay

## 2022-03-13 ENCOUNTER — Telehealth: Payer: Self-pay | Admitting: Internal Medicine

## 2022-03-13 MED ORDER — ROPINIROLE HCL 3 MG PO TABS: 3.0000 mg | ORAL_TABLET | Freq: Every day | ORAL | 3 refills | Status: DC

## 2022-03-18 NOTE — Chronic Care Management (AMB) (Signed)
Ongoing outreach regarding available care management services will occur over the next 30 days ?

## 2022-03-21 ENCOUNTER — Other Ambulatory Visit: Payer: Self-pay | Admitting: Internal Medicine

## 2022-03-21 MED ORDER — ALPRAZOLAM 0.25 MG PO TABS
0.2500 mg | ORAL_TABLET | Freq: Two times a day (BID) | ORAL | 0 refills | Status: DC | PRN
Start: 2022-03-21 — End: 2022-06-02

## 2022-03-21 NOTE — Telephone Encounter (Signed)
?  Encourage patient to contact the pharmacy for refills or they can request refills through [facility] ? ?LAST APPOINTMENT DATE:  Please schedule appointment if longer than 1 year ? ?NEXT APPOINTMENT DATE: ? ?MEDICATION:ALPRAZolam (XANAX) 0.25 MG tablet ? ?Is the patient out of medication?  ?[facility] [id number] -   ?PHARMACY: ? ?Let patient know to contact pharmacy at the end of the day to make sure medication is ready. ? ?Please notify patient to allow 48-72 hours to process ? ?CLINICAL FILLS OUT ALL BELOW:  ? ?LAST REFILL: ? ?QTY: ? ?REFILL DATE: ? ? ? ?OTHER COMMENTS:  ? ? ?Okay for refill? ? ?Please advise ? ? ? ? ?

## 2022-03-24 MED ORDER — SILVER SULFADIAZINE 1 % EX CREA: 1.0000 "application " | TOPICAL_CREAM | Freq: Every day | CUTANEOUS | 0 refills | Status: DC

## 2022-03-24 NOTE — Telephone Encounter (Signed)
Rx sent 

## 2022-03-24 NOTE — Telephone Encounter (Signed)
Pt is requesting a larger tube of cream  ?Encourage patient to contact the pharmacy for refills or they can request refills through [facility] ? ?LAST APPOINTMENT DATE:  Please schedule appointment if longer than 1 year ? ?NEXT APPOINTMENT DATE: ? ?MEDICATION:silver sulfADIAZINE (SILVADENE) 1 % cream ? ?Is the patient out of medication?  ? ?[facility] [phone] - Lorina Rabon,  ? ?Let patient know to contact pharmacy at the end of the day to make sure medication is ready. ? ?Please notify patient to allow 48-72 hours to process ? ?CLINICAL FILLS OUT ALL BELOW:  ? ?LAST REFILL: ? ?QTY: ? ?REFILL DATE: ? ? ? ?OTHER COMMENTS:  ? ? ?Okay for refill? ? ?Please advise ? ? ? ? ?

## 2022-03-27 ENCOUNTER — Encounter: Payer: Medicare Other | Attending: Physician Assistant | Admitting: Physician Assistant

## 2022-03-27 DIAGNOSIS — I10 Essential (primary) hypertension: Secondary | ICD-10-CM | POA: Diagnosis not present

## 2022-03-27 DIAGNOSIS — E11622 Type 2 diabetes mellitus with other skin ulcer: Secondary | ICD-10-CM | POA: Insufficient documentation

## 2022-03-27 DIAGNOSIS — I7300 Raynaud's syndrome without gangrene: Secondary | ICD-10-CM | POA: Diagnosis not present

## 2022-03-27 DIAGNOSIS — E11621 Type 2 diabetes mellitus with foot ulcer: Secondary | ICD-10-CM | POA: Insufficient documentation

## 2022-03-27 DIAGNOSIS — L97812 Non-pressure chronic ulcer of other part of right lower leg with fat layer exposed: Secondary | ICD-10-CM | POA: Diagnosis not present

## 2022-03-27 DIAGNOSIS — I2729 Other secondary pulmonary hypertension: Secondary | ICD-10-CM | POA: Diagnosis not present

## 2022-03-27 DIAGNOSIS — L97822 Non-pressure chronic ulcer of other part of left lower leg with fat layer exposed: Secondary | ICD-10-CM | POA: Diagnosis not present

## 2022-03-27 NOTE — Progress Notes (Signed)
SYRA, SIRMONS ([id number]) ?Visit Report for 03/27/2022 ?Allergy List Details ?Patient Name: Kari Medina, Kari Medina ?Date of Service: 03/27/2022 12:45 PM ?Medical Record Number: [id number] ?Patient Account Number: [id number] ?Date of Birth/Sex: 21-[name]-1946 (76 y.o. Female) ?Treating RN: Levora Dredge ?Primary Care Arneda Sappington: [name] Other Clinician: ?Referring Faris Coolman: [name] ?Treating Shaquanta Harkless/Extender: Jeri Cos ?Weeks in Treatment: 0 ?Allergies ?Active Allergies ?latex ?Reaction: hives ?nickel ?pramipexole ?Reaction: hallucinations ?prednisone ?Reaction: feel crazy ?topiramate ?Reaction: spaced out ?citalopram ?paroxetine HCl ?sertraline ?Allergy Notes ?Electronic Signature(s) ?Signed: 03/27/2022 4:48:00 PM By: Levora Dredge ?Entered By: Levora Dredge on 03/27/2022 13:08:42 ?TOKIKO, DIEFENDERFER. ([id number]) ?-------------------------------------------------------------------------------- ?Arrival Information Details ?Patient Name: Kari, Medina ?Date of Service: 03/27/2022 12:45 PM ?Medical Record Number: [id number] ?Patient Account Number: [id number] ?Date of Birth/Sex: [birth year] (76 y.o. Female) ?Treating RN: Levora Dredge ?Primary Care Eria Lozoya: [name] Other Clinician: ?Referring Annalysa Mohammad: [name] ?Treating Raja Caputi/Extender: Jeri Cos ?Weeks in Treatment: 0 ?Visit Information ?Patient Arrived: Wheel Chair ?Arrival Time: 12:57 ?Accompanied By: husband ?Transfer Assistance: EasyPivot Patient Lift ?Patient Identification Verified: Yes ?Secondary Verification Process Completed: Yes ?Patient Has Alerts: Yes ?Patient Alerts: Patient on Blood Thinner ?Electronic Signature(s) ?Signed: 03/27/2022 4:44:14 PM By: Levora Dredge ?Entered By: Levora Dredge on 03/27/2022 16:44:13 ?NATTALIE, SANTIESTEBAN. ([id number]) ?-------------------------------------------------------------------------------- ?Clinic Level of Care Assessment Details ?Patient Name: Kari, Medina ?Date of Service: 03/27/2022 12:45  PM ?Medical Record Number: [id number] ?Patient Account Number: [id number] ?Date of Birth/Sex: June 29, 1946 (76 y.o. Female) ?Treating RN: Levora Dredge ?Primary Care Waylon Koffler: [name] Other Clinician: ?Referring Jordynn Marcella: [name] ?Treating Marchel Foote/Extender: Jeri Cos ?Weeks in Treatment: 0 ?Clinic Level of Care Assessment Items ?TOOL 2 Quantity Score ?'[]'$  - Use when only an EandM is performed on the INITIAL visit 0 ?ASSESSMENTS - Nursing Assessment / Reassessment ?X - General Physical Exam (combine w/ comprehensive assessment (listed just below) when performed on new ?1 20 ?pt. evals) ?X- 1 25 ?Comprehensive Assessment (HX, ROS, Risk Assessments, Wounds Hx, etc.) ?ASSESSMENTS - Wound and Skin Assessment / Reassessment ?'[]'$  - Simple Wound Assessment / Reassessment - one wound 0 ?X- 3 5 ?Complex Wound Assessment / Reassessment - multiple wounds ?'[]'$  - 0 ?Dermatologic / Skin Assessment (not related to wound area) ?ASSESSMENTS - Ostomy and/or Continence Assessment and Care ?'[]'$  - Incontinence Assessment and Management 0 ?'[]'$  - 0 ?Ostomy Care Assessment and Management (repouching, etc.) ?PROCESS - Coordination of Care ?X - Simple Patient / Family Education for ongoing care 1 15 ?'[]'$  - 0 ?Complex (extensive) Patient / Family Education for ongoing care ?X- 1 10 ?Staff obtains Consents, Records, Test Results / Process Orders ?'[]'$  - 0 ?Staff telephones HHA, Nursing Homes / Clarify orders / etc ?'[]'$  - 0 ?Routine Transfer to another Facility (non-emergent condition) ?'[]'$  - 0 ?Routine Hospital Admission (non-emergent condition) ?X- 1 15 ?New Admissions / Biomedical engineer / Ordering NPWT, Apligraf, etc. ?'[]'$  - 0 ?Emergency Hospital Admission (emergent condition) ?X- 1 10 ?Simple Discharge Coordination ?'[]'$  - 0 ?Complex (extensive) Discharge Coordination ?PROCESS - Special Needs ?'[]'$  - Pediatric / Minor Patient Management 0 ?'[]'$  - 0 ?Isolation Patient Management ?'[]'$  - 0 ?Hearing / Language / Visual special needs ?'[]'$  -  0 ?Assessment of Community assistance (transportation, D/C planning, etc.) ?'[]'$  - 0 ?Additional assistance / Altered mentation ?'[]'$  - 0 ?Support Surface(s) Assessment (bed, cushion, seat, etc.) ?INTERVENTIONS - Wound Cleansing / Measurement ?X - Wound Imaging (photographs - any number of wounds) 1 5 ?'[]'$  - 0 ?Wound Tracing (instead of photographs) ?'[]'$  - 0 ?Simple Wound Measurement - one wound ?X- 3  5 ?Complex Wound Measurement - multiple wounds ?LILYA, SMITHERMAN ([id number]) ?'[]'$  - 0 ?Simple Wound Cleansing - one wound ?X- 3 5 ?Complex Wound Cleansing - multiple wounds ?INTERVENTIONS - Wound Dressings ?X - Small Wound Dressing one or multiple wounds 3 10 ?'[]'$  - 0 ?Medium Wound Dressing one or multiple wounds ?'[]'$  - 0 ?Large Wound Dressing one or multiple wounds ?'[]'$  - 0 ?Application of Medications - injection ?INTERVENTIONS - Miscellaneous ?'[]'$  - External ear exam 0 ?'[]'$  - 0 ?Specimen Collection (cultures, biopsies, blood, body fluids, etc.) ?'[]'$  - 0 ?Specimen(s) / Culture(s) sent or taken to Lab for analysis ?'[]'$  - 0 ?Patient Transfer (multiple staff / Civil Service fast streamer / Similar devices) ?'[]'$  - 0 ?Simple Staple / Suture removal (25 or less) ?'[]'$  - 0 ?Complex Staple / Suture removal (26 or more) ?'[]'$  - 0 ?Hypo / Hyperglycemic Management (close monitor of Blood Glucose) ?X- 1 15 ?Ankle / Brachial Index (ABI) - do not check if billed separately ?Has the patient been seen at the hospital within the last three years: Yes ?Total Score: 190 ?Level Of Care: New/Established - Level ?5 ?Electronic Signature(s) ?Signed: 03/27/2022 4:48:00 PM By: Levora Dredge ?Entered By: Levora Dredge on 03/27/2022 16:35:15 ?HOMER, PFEIFER. ([id number]) ?-------------------------------------------------------------------------------- ?Compression Therapy Details ?Patient Name: [name], Medina ?Date of Service: 03/27/2022 12:45 PM ?Medical Record Number: [id number] ?Patient Account Number: [id number] ?Date of Birth/Sex: 1946/06/17 (76 y.o. Female) ?Treating RN: Levora Dredge ?Primary Care Marcelia Petersen: [name] Other Clinician: ?Referring Laloni Rowton: [name] ?Treating Amarra Sawyer/Extender: Jeri Cos ?Weeks in Treatment: 0 ?Compression Therapy Performed for Wound Assessment: Wound #1 Right,Distal Lower Leg ?Performed By: Clinician Levora Dredge, RN ?Compression Type: Three Layer ?Post Procedure Diagnosis ?Same as Pre-procedure ?Electronic Signature(s) ?Signed: 03/27/2022 4:36:06 PM By: Levora Dredge ?Entered By: Levora Dredge on 03/27/2022 16:36:06 ?LILLYANN, AHART. ([id number]) ?-------------------------------------------------------------------------------- ?Compression Therapy Details ?Patient Name: KERRINGTON, SOVA ?Date of Service: 03/27/2022 12:45 PM ?Medical Record Number: [id number] ?Patient Account Number: [id number] ?Date of Birth/Sex: 10/16/1946 (76 y.o. Female) ?Treating RN: Levora Dredge ?Primary Care Neale Marzette: [name] Other Clinician: ?Referring Jamarco Zaldivar: [name] ?Treating Jovee Dettinger/Extender: Jeri Cos ?Weeks in Treatment: 0 ?Compression Therapy Performed for Wound Assessment: Wound #3 Left,Circumferential Lower Leg ?Performed By: Clinician Levora Dredge, RN ?Compression Type: Three Layer ?Post Procedure Diagnosis ?Same as Pre-procedure ?Electronic Signature(s) ?Signed: 03/27/2022 4:36:06 PM By: Levora Dredge ?Entered By: Levora Dredge on 03/27/2022 16:36:06 ?ESTER, HILLEY. ([id number]) ?-------------------------------------------------------------------------------- ?Encounter Discharge Information Details ?Patient Name: ANEIRA, CAVITT ?Date of Service: 03/27/2022 12:45 PM ?Medical Record Number: [id number] ?Patient Account Number: [id number] ?Date of Birth/Sex: 22-Nov-1946 (76 y.o. Female) ?Treating RN: Levora Dredge ?Primary Care Merle Whitehorn: [name] Other Clinician: ?Referring Sharyah Bostwick: [name] ?Treating Chez Bulnes/Extender: Jeri Cos ?Weeks in Treatment: 0 ?Encounter Discharge Information Items ?Discharge Condition:  Stable ?Ambulatory Status: Wheelchair ?Discharge Destination: Home ?Transportation: Private Auto ?Accompanied By: husband ?Schedule Follow-up Appointment: Yes ?Clinical Summary of Care: Patient Declined ?Electronic Sign

## 2022-03-27 NOTE — Progress Notes (Signed)
Kari Medina, Kari Medina ([id number]) ?Visit Report for 03/27/2022 ?Abuse Risk Screen Details ?Patient Name: Kari Medina, Kari Medina ?Date of Service: 03/27/2022 12:45 PM ?Medical Record Number: [id number] ?Patient Account Number: [id number] ?Date of Birth/Sex: 1946/05/27 (76 y.o. Female) ?Treating RN: Levora Dredge ?Primary Care Jhanvi Drakeford: [name] Other Clinician: ?Referring Trusten Hume: [name] ?Treating Arnel Wymer/Extender: Jeri Cos ?Weeks in Treatment: 0 ?Abuse Risk Screen Items ?Answer ?ABUSE RISK SCREEN: ?Has anyone close to you tried to hurt or harm you recentlyo No ?Do you feel uncomfortable with anyone in your familyo No ?Has anyone forced you do things that you didnot want to doo No ?Electronic Signature(s) ?Signed: 03/27/2022 4:48:00 PM By: Levora Dredge ?Entered By: Levora Dredge on 03/27/2022 13:17:08 ?Kari Medina, Kari Medina. ([id number]) ?-------------------------------------------------------------------------------- ?Activities of Daily Living Details ?Patient Name: Kari Medina, Kari Medina ?Date of Service: 03/27/2022 12:45 PM ?Medical Record Number: [id number] ?Patient Account Number: [id number] ?Date of Birth/Sex: 08-06-1946 (76 y.o. Female) ?Treating RN: Levora Dredge ?Primary Care Gurinder Toral: [name] Other Clinician: ?Referring Harvey Matlack: [name] ?Treating Shalece Staffa/Extender: Jeri Cos ?Weeks in Treatment: 0 ?Activities of Daily Living Items ?Answer ?Activities of Daily Living (Please select one for each item) ?Drive Automobile Not Able ?Take Medications Completely Able ?Use Telephone Completely Able ?Care for Appearance Need Assistance ?Use Toilet Need Assistance ?Bath / Shower Need Assistance ?Dress Self Need Assistance ?Feed Self Completely Able ?Walk Need Assistance ?Get In / Out Bed Need Assistance ?Housework Need Assistance ?Prepare Meals Need Assistance ?Handle Money Need Assistance ?Shop for Self Need Assistance ?Electronic Signature(s) ?Signed: 03/27/2022 4:48:00 PM By: Levora Dredge ?Entered By:  Levora Dredge on 03/27/2022 13:18:59 ?Kari Medina, Kari Medina. ([id number]) ?-------------------------------------------------------------------------------- ?Education Screening Details ?Patient Name: Kari Medina, Kari Medina ?Date of Service: 03/27/2022 12:45 PM ?Medical Record Number: [id number] ?Patient Account Number: [id number] ?Date of Birth/Sex: [name] 23, 1946 (76 y.o. Female) ?Treating RN: Levora Dredge ?Primary Care Neta Upadhyay: [name] Other Clinician: ?Referring Locklyn Henriquez: [name] ?Treating Jhayden Demuro/Extender: Jeri Cos ?Weeks in Treatment: 0 ?Learning Preferences/Education Level/Primary Language ?Learning Preference: Explanation, Demonstration, Video, Communication Board, Printed Material ?Preferred Language: English ?Cognitive Barrier ?Language Barrier: No ?Translator Needed: No ?Memory Deficit: No ?Emotional Barrier: No ?Cultural/Religious Beliefs Affecting Medical Care: No ?Physical Barrier ?Impaired Vision: Yes ?Impaired Hearing: No ?Decreased Hand dexterity: No ?Knowledge/Comprehension ?Knowledge Level: High ?Comprehension Level: High ?Ability to understand written instructions: High ?Ability to understand verbal instructions: High ?Motivation ?Anxiety Level: Calm ?Cooperation: Cooperative ?Education Importance: Acknowledges Need ?Interest in Health Problems: Asks Questions ?Perception: Coherent ?Willingness to Engage in Self-Management ?High ?Activities: ?Readiness to Engage in Self-Management ?High ?Activities: ?Electronic Signature(s) ?Signed: 03/27/2022 4:48:00 PM By: Levora Dredge ?Entered By: Levora Dredge on 03/27/2022 13:19:23 ?Kari Medina, Kari Medina. ([id number]) ?-------------------------------------------------------------------------------- ?Fall Risk Assessment Details ?Patient Name: Kari Medina, Kari Medina ?Date of Service: 03/27/2022 12:45 PM ?Medical Record Number: [id number] ?Patient Account Number: [id number] ?Date of Birth/Sex: 01/10/1946 (76 y.o. Female) ?Treating RN: Levora Dredge ?Primary Care Maygen Sirico:  [name] Other Clinician: ?Referring Jimmylee Ratterree: [name] ?Treating Rashaun Curl/Extender: Jeri Cos ?Weeks in Treatment: 0 ?Fall Risk Assessment Items ?Have you had 2 or more falls in the last 12 monthso 0 Yes ?Have you had any fall that resulted in injury in the last 12 monthso 0 Yes ?FALLS RISK SCREEN ?History of falling - immediate or within 3 months 0 No ?Secondary diagnosis (Do you have 2 or more medical diagnoseso) 0 No ?Ambulatory aid ?None/bed rest/wheelchair/nurse 0 Yes ?Crutches/cane/walker 15 Yes ?Furniture 0 No ?Intravenous therapy Access/Saline/Heparin Lock 0 No ?Gait/Transferring ?Normal/ bed rest/ wheelchair 0 No ?Weak (short steps with or without shuffle, stooped but able to  lift head while walking, may ?10 Yes ?seek support from furniture) ?Impaired (short steps with shuffle, may have difficulty arising from chair, head down, impaired ?0 No ?balance) ?Mental Status ?Oriented to own ability 0 Yes ?Electronic Signature(s) ?Signed: 03/27/2022 4:48:00 PM By: Levora Dredge ?Entered By: Levora Dredge on 03/27/2022 13:20:13 ?Kari Medina, Kari Medina. ([id number]) ?-------------------------------------------------------------------------------- ?Foot Assessment Details ?Patient Name: Kari Medina, Kari Medina ?Date of Service: 03/27/2022 12:45 PM ?Medical Record Number: [id number] ?Patient Account Number: [id number] ?Date of Birth/Sex: [birth year] (76 y.o. Female) ?Treating RN: Levora Dredge ?Primary Care Shanetta Nicolls: [name] Other Clinician: ?Referring Graceyn Fodor: [name] ?Treating Chabeli Barsamian/Extender: Jeri Cos ?Weeks in Treatment: 0 ?Foot Assessment Items ?Site Locations ?+ = Sensation present, - = Sensation absent, C = Callus, U = Ulcer ?R = Redness, W = Warmth, M = Maceration, PU = Pre-ulcerative lesion ?F = Fissure, S = Swelling, D = Dryness ?Assessment ?Right: Left: ?Other Deformity: No No ?Prior Foot Ulcer: No No ?Prior Amputation: No No ?Charcot Joint: No No ?Ambulatory Status: Ambulatory With  Help ?Assistance Device: Wheelchair ?Gait: Steady ?Electronic Signature(s) ?Signed: 03/27/2022 4:48:00 PM By: Levora Dredge ?Entered By: Levora Dredge on 03/27/2022 13:29:21 ?Kari Medina, Kari Medina. ([id number]) ?-------------------------------------------------------------------------------- ?Nutrition Risk Screening Details ?Patient Name: Kari Medina, Kari Medina ?Date of Service: 03/27/2022 12:45 PM ?Medical Record Number: [id number] ?Patient Account Number: [id number] ?Date of Birth/Sex: 27-Oct-1946 (76 y.o. Female) ?Treating RN: Levora Dredge ?Primary Care Gedalya Jim: [name] Other Clinician: ?Referring Shanique Aslinger: [name] ?Treating Kaj Vasil/Extender: Jeri Cos ?Weeks in Treatment: 0 ?Height (in): ?Weight (lbs): ?Body Mass Index (BMI): ?Nutrition Risk Screening Items ?Score Screening ?NUTRITION RISK SCREEN: ?I have an illness or condition that made me change the kind and/or amount of food I eat 0 No ?I eat fewer than two meals per day 0 No ?I eat few fruits and vegetables, or milk products 0 No ?I have three or more drinks of beer, liquor or wine almost every day 0 No ?I have tooth or mouth problems that make it hard for me to eat 0 No ?I don't always have enough money to buy the food I need 0 No ?I eat alone most of the time 0 No ?I take three or more different prescribed or over-the-counter drugs a day 0 No ?Without wanting to, I have lost or gained 10 pounds in the last six months 0 No ?I am not always physically able to shop, cook and/or feed myself 0 No ?Nutrition Protocols ?Good Risk Protocol ?Moderate Risk Protocol 0 Provide education on nutrition ?High Risk Proctocol ?Risk Level: Good Risk ?Score: 0 ?Notes ?pt states she does not have much of an appetite ?Electronic Signature(s) ?Signed: 03/27/2022 4:48:00 PM By: Levora Dredge ?Entered By: Levora Dredge on 03/27/2022 13:22:24 ?

## 2022-03-28 NOTE — Progress Notes (Addendum)
JAHAIRA, EARNHART ([id number]) ?Visit Report for 03/27/2022 ?Chief Complaint Document Details ?Patient Name: Kari Medina, Kari Medina ?Date of Service: 03/27/2022 12:45 PM ?Medical Record Number: [id number] ?Patient Account Number: [id number] ?Date of Birth/Sex: 31-Oct-1946 (76 y.o. Female) ?Treating RN: Levora Dredge ?Primary Care Provider: [name] Other Clinician: ?Referring Provider: [name] ?Treating Provider/Extender: Jeri Cos ?Weeks in Treatment: 0 ?Information Obtained from: Patient ?Chief Complaint ?Bilateral LE Ulcers ?Electronic Signature(s) ?Signed: 03/27/2022 1:57:24 PM By: Worthy Keeler PA-C ?Entered By: Worthy Keeler on 03/27/2022 13:57:24 ?MABEL, ROLL ([id number]) ?-------------------------------------------------------------------------------- ?HPI Details ?Patient Name: Kari Medina ?Date of Service: 03/27/2022 12:45 PM ?Medical Record Number: [id number] ?Patient Account Number: [id number] ?Date of Birth/Sex: 15-Jun-1946 (76 y.o. Female) ?Treating RN: Levora Dredge ?Primary Care Provider: [name] Other Clinician: ?Referring Provider: [name] ?Treating Provider/Extender: Jeri Cos ?Weeks in Treatment: 0 ?History of Present Illness ?HPI Description: 03-27-2022 upon evaluation today patient presents for initial evaluation here in the clinic concerning issues she has been having ?with her wounds on her legs. Subsequently she is telling me that they burn and do hurt quite a bit. Unfortunately she is not really in the state of ?mind she tells me to allow for any sharp debridement for that reason were probably can have to try something different from the standpoint of ?topical medications and under wrap in order to try to get this under control. Fortunately I do believe this is good to be something we can likely ?heal but again it absolutely has to involve getting the swelling down if working to see any signs of improvement. The patient in particular tells me ?this started December 2022  while she was in the hospital. She does go to [facility] [facility] in March they sent her here. Currently she has ?been using Silvadene cream which is really not helping. She did have some falls not too far back where she did have some broken ribs with ?bruising. ?The patient does have a history of diabetes mellitus type 2 with a hemoglobin A1c of 7.23 October 2021 most recent. She also has a history of ?pulmonary hypertension, Raynaud's syndrome without gangrene, and hypertension in general. ?Electronic Signature(s) ?Signed: 03/28/2022 5:02:59 PM By: Worthy Keeler PA-C ?Entered By: Worthy Keeler on 03/28/2022 17:02:59 ?TRYNITI, LAATSCH. ([id number]) ?-------------------------------------------------------------------------------- ?Physical Exam Details ?Patient Name: Kari Medina ?Date of Service: 03/27/2022 12:45 PM ?Medical Record Number: [id number] ?Patient Account Number: [id number] ?Date of Birth/Sex: 1945-12-25 (76 y.o. Female) ?Treating RN: Levora Dredge ?Primary Care Provider: [name] Other Clinician: ?Referring Provider: [name] ?Treating Provider/Extender: Jeri Cos ?Weeks in Treatment: 0 ?Constitutional ?sitting or standing blood pressure is within target range for patient.. pulse regular and within target range for patient.Marland Kitchen respirations regular, non- ?labored and within target range for patient.Marland Kitchen temperature within target range for patient.. Well-nourished and well-hydrated in no acute distress. ?Eyes ?conjunctiva clear no eyelid edema noted. pupils equal round and reactive to light and accommodation. ?Ears, Nose, Mouth, and Throat ?no gross abnormality of ear auricles or external auditory canals. normal hearing noted during conversation. mucus membranes moist. ?Respiratory ?normal breathing without difficulty. ?Cardiovascular ?2+ dorsalis pedis/posterior tibialis pulses. no clubbing, cyanosis, significant edema, <3 sec cap refill. ?Musculoskeletal ?normal gait and posture. no  significant deformity or arthritic changes, no loss or range of motion, no clubbing. ?Psychiatric ?this patient is able to make decisions and demonstrates good insight into disease process. Alert and Oriented x 3. pleasant and cooperative. ?Notes ?Upon inspection patient's wound beds actually can require some debridement.  With that being said she is a little reluctant for sharp debridement ?today. For that reason I think doing this chemically with the Iodosorb is probably can be our best option and I discussed that with her today. ?Fortunately I do not see any evidence of infection at this time which is great news. Her ABIs were excellent as well showing no signs of vascular ?compromise. ?Electronic Signature(s) ?Signed: 03/28/2022 5:04:45 PM By: Worthy Keeler PA-C ?Entered By: Worthy Keeler on 03/28/2022 17:04:44 ?CAIA, LOFARO. ([id number]) ?-------------------------------------------------------------------------------- ?Physician Orders Details ?Patient Name: Kari Medina ?Date of Service: 03/27/2022 12:45 PM ?Medical Record Number: [id number] ?Patient Account Number: [id number] ?Date of Birth/Sex: 06/09/1946 (76 y.o. Female) ?Treating RN: Levora Dredge ?Primary Care Provider: [name] Other Clinician: ?Referring Provider: [name] ?Treating Provider/Extender: Jeri Cos ?Weeks in Treatment: 0 ?Verbal / Phone Orders: No ?Diagnosis Coding ?ICD-10 Coding ?Code Description ?E11.622 Type 2 diabetes mellitus with other skin ulcer ?E99.371 Non-pressure chronic ulcer of other part of right lower leg with fat layer exposed ?I96.789 Non-pressure chronic ulcer of other part of left lower leg with fat layer exposed ?I27.29 Other secondary pulmonary hypertension ?I73.00 Raynaud's syndrome without gangrene ?I10 Essential (primary) hypertension ?Follow-up Appointments ?o Return Appointment in 1 week. ?o Nurse Visit as needed ?Home Health ?[place] ?o ADMIT to Home Health for  wound care. May utilize formulary equivalent dressing for wound treatment orders unless ?otherwise specified. Home Health Nurse may visit PRN to address patientos wound care needs. ?o Scheduled days for dressing changes to be completed; exception, patient has scheduled wound care visit that day. ?o **Please direct any NON-WOUND related issues/requests for orders to patient's Primary Care Physician. **If current ?dressing causes regression in wound condition, may D/C ordered dressing product/s and apply Normal Saline Moist ?Dressing daily until next [place] or Other MD appointment. **Notify [facility] of regression in ?wound condition at [phone]. ?Bathing/ Shower/ Hygiene ?o Wash wounds with antibacterial soap and water. ?o May shower with wound dressing protected with water repellent cover or cast protector. ?o No tub bath. ?Edema Control - Lymphedema / Segmental Compressive Device / Other ?o Elevate leg(s) parallel to the floor when sitting. ?o DO YOUR BEST to sleep in the bed at night. DO NOT sleep in your recliner. Long hours of sitting in a recliner leads to ?swelling of the legs and/or potential wounds on your backside. ?Wound Treatment ?Wound #1 - Lower Leg Wound Laterality: Right, Distal ?Cleanser: Normal Saline 1 x Per Week/30 Days ?Discharge Instructions: Wash your hands with soap and water. Remove old dressing, discard into plastic bag and place into trash. ?Cleanse the wound with Normal Saline prior to applying a clean dressing using gauze sponges, not tissues or cotton balls. Do not scrub ?or use excessive force. Pat dry using gauze sponges, not tissue or cotton balls. ?Cleanser: Soap and Water 1 x Per Week/30 Days ?Discharge Instructions: Gently cleanse wound with antibacterial soap, rinse and pat dry prior to dressing wounds ?Primary Dressing: Iodosorb 40 (g) 1 x Per Week/30 Days ?Discharge Instructions: Apply IodoSorb to wound bed only as directed. ?Secondary  Dressing: (NON-BORDER) Zetuvit Plus Silicone NON-BORDER 5x5 (in/in) 1 x Per Week/30 Days ?Discharge Instructions: Please do not put silicone bordered dressings under wraps. Use non-bordered dressing only under ?

## 2022-04-04 ENCOUNTER — Ambulatory Visit: Payer: Medicare Other

## 2022-04-07 DIAGNOSIS — Z20822 Contact with and (suspected) exposure to covid-19: Secondary | ICD-10-CM | POA: Diagnosis not present

## 2022-04-08 ENCOUNTER — Telehealth: Payer: Self-pay | Admitting: Internal Medicine

## 2022-04-08 MED ORDER — SILVER SULFADIAZINE 1 % EX CREA: 1.0000 "application " | TOPICAL_CREAM | Freq: Every day | CUTANEOUS | 1 refills | Status: DC

## 2022-04-08 NOTE — Telephone Encounter (Signed)
?  Encourage patient to contact the pharmacy for refills or they can request refills through [facility] ? ?LAST APPOINTMENT DATE:  Please schedule appointment if longer than 1 year ? ?NEXT APPOINTMENT DATE: ? ?MEDICATION:silver sulfADIAZINE (SILVADENE) 1 % cream ? ?Is the patient out of medication?  ? ?PHARMACY:[facility] [id number] - [address], [place] ? ?Let patient know to contact pharmacy at the end of the day to make sure medication is ready. ? ?Please notify patient to allow 48-72 hours to process ? ?CLINICAL FILLS OUT ALL BELOW:  ? ?LAST REFILL: ? ?QTY: ? ?REFILL DATE: ? ? ? ?OTHER COMMENTS:  ? ? ?Okay for refill? ? ?Please advise ? ? ?  ?

## 2022-04-08 NOTE — Telephone Encounter (Signed)
Please let them know I sent it to [facility] ?

## 2022-04-08 NOTE — Telephone Encounter (Signed)
Is this okay to refill? 

## 2022-04-09 ENCOUNTER — Other Ambulatory Visit: Payer: Self-pay

## 2022-04-09 MED ORDER — SILVER SULFADIAZINE 1 % EX CREA: 1.0000 "application " | TOPICAL_CREAM | Freq: Every day | CUTANEOUS | 1 refills | Status: DC

## 2022-04-09 NOTE — Telephone Encounter (Signed)
Rx sent to pharmacy   

## 2022-04-10 ENCOUNTER — Ambulatory Visit: Payer: Medicare Other | Admitting: Physician Assistant

## 2022-04-10 DIAGNOSIS — E1151 Type 2 diabetes mellitus with diabetic peripheral angiopathy without gangrene: Secondary | ICD-10-CM | POA: Diagnosis not present

## 2022-04-10 DIAGNOSIS — E11622 Type 2 diabetes mellitus with other skin ulcer: Secondary | ICD-10-CM | POA: Diagnosis not present

## 2022-04-10 DIAGNOSIS — Z87891 Personal history of nicotine dependence: Secondary | ICD-10-CM | POA: Diagnosis not present

## 2022-04-10 DIAGNOSIS — L97812 Non-pressure chronic ulcer of other part of right lower leg with fat layer exposed: Secondary | ICD-10-CM | POA: Diagnosis not present

## 2022-04-10 DIAGNOSIS — E039 Hypothyroidism, unspecified: Secondary | ICD-10-CM | POA: Diagnosis not present

## 2022-04-10 DIAGNOSIS — Z4800 Encounter for change or removal of nonsurgical wound dressing: Secondary | ICD-10-CM | POA: Diagnosis not present

## 2022-04-10 DIAGNOSIS — Z9981 Dependence on supplemental oxygen: Secondary | ICD-10-CM | POA: Diagnosis not present

## 2022-04-10 DIAGNOSIS — G4730 Sleep apnea, unspecified: Secondary | ICD-10-CM | POA: Diagnosis not present

## 2022-04-10 DIAGNOSIS — L97822 Non-pressure chronic ulcer of other part of left lower leg with fat layer exposed: Secondary | ICD-10-CM | POA: Diagnosis not present

## 2022-04-10 DIAGNOSIS — J449 Chronic obstructive pulmonary disease, unspecified: Secondary | ICD-10-CM | POA: Diagnosis not present

## 2022-04-10 DIAGNOSIS — E1140 Type 2 diabetes mellitus with diabetic neuropathy, unspecified: Secondary | ICD-10-CM | POA: Diagnosis not present

## 2022-04-10 DIAGNOSIS — M797 Fibromyalgia: Secondary | ICD-10-CM | POA: Diagnosis not present

## 2022-04-10 DIAGNOSIS — I2729 Other secondary pulmonary hypertension: Secondary | ICD-10-CM | POA: Diagnosis not present

## 2022-04-10 DIAGNOSIS — Z7951 Long term (current) use of inhaled steroids: Secondary | ICD-10-CM | POA: Diagnosis not present

## 2022-04-10 DIAGNOSIS — I890 Lymphedema, not elsewhere classified: Secondary | ICD-10-CM | POA: Diagnosis not present

## 2022-04-10 DIAGNOSIS — M1990 Unspecified osteoarthritis, unspecified site: Secondary | ICD-10-CM | POA: Diagnosis not present

## 2022-04-10 DIAGNOSIS — I10 Essential (primary) hypertension: Secondary | ICD-10-CM | POA: Diagnosis not present

## 2022-04-18 ENCOUNTER — Other Ambulatory Visit: Payer: Self-pay | Admitting: Internal Medicine

## 2022-04-18 ENCOUNTER — Ambulatory Visit: Payer: Medicare Other | Admitting: Physician Assistant

## 2022-04-18 DIAGNOSIS — I872 Venous insufficiency (chronic) (peripheral): Secondary | ICD-10-CM

## 2022-04-18 DIAGNOSIS — I5032 Chronic diastolic (congestive) heart failure: Secondary | ICD-10-CM

## 2022-04-24 ENCOUNTER — Ambulatory Visit: Payer: Medicare Other | Admitting: Internal Medicine

## 2022-04-24 ENCOUNTER — Other Ambulatory Visit: Payer: Self-pay | Admitting: Internal Medicine

## 2022-04-24 NOTE — Telephone Encounter (Signed)
Pt called to follow up on this and asked if someone could call her at [phone] whether its approved or not approve but that way she is aware ?

## 2022-04-25 ENCOUNTER — Ambulatory Visit: Payer: Medicare Other | Admitting: Physician Assistant

## 2022-04-25 ENCOUNTER — Telehealth: Payer: Self-pay

## 2022-04-25 DIAGNOSIS — E1140 Type 2 diabetes mellitus with diabetic neuropathy, unspecified: Secondary | ICD-10-CM | POA: Diagnosis not present

## 2022-04-25 DIAGNOSIS — E1151 Type 2 diabetes mellitus with diabetic peripheral angiopathy without gangrene: Secondary | ICD-10-CM | POA: Diagnosis not present

## 2022-04-25 DIAGNOSIS — E11622 Type 2 diabetes mellitus with other skin ulcer: Secondary | ICD-10-CM | POA: Diagnosis not present

## 2022-04-25 DIAGNOSIS — I890 Lymphedema, not elsewhere classified: Secondary | ICD-10-CM | POA: Diagnosis not present

## 2022-04-25 DIAGNOSIS — L97822 Non-pressure chronic ulcer of other part of left lower leg with fat layer exposed: Secondary | ICD-10-CM | POA: Diagnosis not present

## 2022-04-25 DIAGNOSIS — L97812 Non-pressure chronic ulcer of other part of right lower leg with fat layer exposed: Secondary | ICD-10-CM | POA: Diagnosis not present

## 2022-04-25 NOTE — Telephone Encounter (Signed)
Husband called saying she is having hallucinations. Her urine is very dark. Someone suggested she could have a UTI. I advised him Dr Silvio Pate is out of the office this afternoon. The best thing would be to go to a UC and if she does not have a UTI, then go to the ER for workup. I mentioned dehydration. He said she drinks lots of water a day. ?

## 2022-04-25 NOTE — Telephone Encounter (Signed)
This was started in the hospital. Will forward to Dr Silvio Pate for approval. ?

## 2022-04-28 ENCOUNTER — Ambulatory Visit: Payer: Medicare Other | Admitting: Internal Medicine

## 2022-04-29 ENCOUNTER — Ambulatory Visit: Payer: Medicare Other | Admitting: Internal Medicine

## 2022-04-29 DIAGNOSIS — I890 Lymphedema, not elsewhere classified: Secondary | ICD-10-CM | POA: Diagnosis not present

## 2022-04-29 DIAGNOSIS — E1140 Type 2 diabetes mellitus with diabetic neuropathy, unspecified: Secondary | ICD-10-CM | POA: Diagnosis not present

## 2022-04-29 DIAGNOSIS — E11622 Type 2 diabetes mellitus with other skin ulcer: Secondary | ICD-10-CM | POA: Diagnosis not present

## 2022-04-29 DIAGNOSIS — L97812 Non-pressure chronic ulcer of other part of right lower leg with fat layer exposed: Secondary | ICD-10-CM | POA: Diagnosis not present

## 2022-04-29 DIAGNOSIS — L97822 Non-pressure chronic ulcer of other part of left lower leg with fat layer exposed: Secondary | ICD-10-CM | POA: Diagnosis not present

## 2022-04-29 DIAGNOSIS — E1151 Type 2 diabetes mellitus with diabetic peripheral angiopathy without gangrene: Secondary | ICD-10-CM | POA: Diagnosis not present

## 2022-04-30 ENCOUNTER — Telehealth: Payer: Self-pay | Admitting: Internal Medicine

## 2022-04-30 NOTE — Telephone Encounter (Signed)
Spoke to [place]. She will let her husband know. ?

## 2022-04-30 NOTE — Telephone Encounter (Signed)
Kari Medina from [facility] called to let Dr. Silvio Pate know, that the pt slipped yesterday and the EMS had to come help her off of the floor, no injuries.  She also stated "she needs a wheelchair, any suggestions from Dr. Silvio Pate."  ? ?Callback Number: [phone] ?

## 2022-05-01 ENCOUNTER — Encounter: Payer: Medicare Other | Attending: Physician Assistant | Admitting: Physician Assistant

## 2022-05-01 DIAGNOSIS — E11622 Type 2 diabetes mellitus with other skin ulcer: Secondary | ICD-10-CM | POA: Insufficient documentation

## 2022-05-01 DIAGNOSIS — L97812 Non-pressure chronic ulcer of other part of right lower leg with fat layer exposed: Secondary | ICD-10-CM | POA: Insufficient documentation

## 2022-05-01 DIAGNOSIS — I7300 Raynaud's syndrome without gangrene: Secondary | ICD-10-CM | POA: Diagnosis not present

## 2022-05-01 DIAGNOSIS — I2729 Other secondary pulmonary hypertension: Secondary | ICD-10-CM | POA: Diagnosis not present

## 2022-05-01 DIAGNOSIS — E669 Obesity, unspecified: Secondary | ICD-10-CM | POA: Diagnosis not present

## 2022-05-01 DIAGNOSIS — I10 Essential (primary) hypertension: Secondary | ICD-10-CM | POA: Diagnosis not present

## 2022-05-01 DIAGNOSIS — L97822 Non-pressure chronic ulcer of other part of left lower leg with fat layer exposed: Secondary | ICD-10-CM | POA: Insufficient documentation

## 2022-05-01 NOTE — Progress Notes (Addendum)
AMARYLIS, ROVITO ([id number]) ?Visit Report for 05/01/2022 ?Chief Complaint Document Details ?Patient Name: Kari Medina, Kari Medina ?Date of Service: 05/01/2022 1:30 PM ?Medical Record Number: [id number] ?Patient Account Number: [id number] ?Date of Birth/Sex: 06-02-1946 (76 y.o. F) ?Treating RN: Levora Dredge ?Primary Care Provider: [name] Other Clinician: ?Referring Provider: [name] ?Treating Provider/Extender: Jeri Cos ?Weeks in Treatment: 5 ?Information Obtained from: Patient ?Chief Complaint ?Bilateral LE Ulcers ?Electronic Signature(s) ?Signed: 05/01/2022 2:04:17 PM By: Worthy Keeler PA-C ?Entered By: Worthy Keeler on 05/01/2022 14:04:17 ?JOZIE, WULF ([id number]) ?-------------------------------------------------------------------------------- ?HPI Details ?Patient Name: Kari Medina ?Date of Service: 05/01/2022 1:30 PM ?Medical Record Number: [id number] ?Patient Account Number: [id number] ?Date of Birth/Sex: [birth year] (76 y.o. F) ?Treating RN: Levora Dredge ?Primary Care Provider: [name] Other Clinician: ?Referring Provider: [name] ?Treating Provider/Extender: Jeri Cos ?Weeks in Treatment: 5 ?History of Present Illness ?HPI Description: 03-27-2022 upon evaluation today patient presents for initial evaluation here in the clinic concerning issues she has been having ?with her wounds on her legs. Subsequently she is telling me that they burn and do hurt quite a bit. Unfortunately she is not really in the state of ?mind she tells me to allow for any sharp debridement for that reason were probably can have to try something different from the standpoint of ?topical medications and under wrap in order to try to get this under control. Fortunately I do believe this is good to be something we can likely ?heal but again it absolutely has to involve getting the swelling down if working to see any signs of improvement. The patient in particular tells me ?this started December 2022 while  she was in the hospital. She does go to [facility] in March they sent her here. Currently she has ?been using Silvadene cream which is really not helping. She did have some falls not too far back where she did have some broken ribs with ?bruising. ?The patient does have a history of diabetes mellitus type 2 with a hemoglobin A1c of 7.23 October 2021 most recent. She also has a history of ?pulmonary hypertension, Raynaud's syndrome without gangrene, and hypertension in general. ?05-01-2022 upon evaluation today patient appears to be doing okay in regard to her wounds. She has been tolerating the Iodoflex without ?significant complication. With that being said she does tell me that it burns somewhat but she is able to tolerate this they have gotten some ice ?packs that she uses which seems to soothe things to some degree. This is good news. With that being said I do not see any evidence of active ?infection locally or systemically at this time. She does note that it is really can be hard for her to come back and be seen frequently she request at ?least a 1 month check in between each time and to let home health manage things in between. ?Electronic Signature(s) ?Signed: 05/02/2022 7:27:30 AM By: Worthy Keeler PA-C ?Entered By: Worthy Keeler on 05/02/2022 07:27:29 ?ALLIA, WILTSEY. ([id number]) ?-------------------------------------------------------------------------------- ?Physical Exam Details ?Patient Name: Kari Medina ?Date of Service: 05/01/2022 1:30 PM ?Medical Record Number: [id number] ?Patient Account Number: [id number] ?Date of Birth/Sex: 18-Dec-1946 (76 y.o. F) ?Treating RN: Levora Dredge ?Primary Care Provider: [name] Other Clinician: ?Referring Provider: [name] ?Treating Provider/Extender: Jeri Cos ?Weeks in Treatment: 5 ?Constitutional ?Obese and well-hydrated in no acute distress. ?Respiratory ?labored, rapid respiration. ?Cardiovascular ?2+ pitting edema of the bilateral  lower extremities. ?Psychiatric ?this patient is able to make decisions and demonstrates good  insight into disease process. Alert and Oriented x 3. pleasant and cooperative. ?Notes ?Upon inspection patient's wound bed actually showed signs of good granulation and epithelization there was some areas of improvement there ?were also some areas that are still showing signs of quite a bit of weeping and slough buildup as well. Nonetheless I think the Iodoflex is still the ?way to go although I do believe that the patient also is benefiting from the compression wrapping. Home health is helping to take care of her at ?home. Unfortunately her breathing was not good she was averaging oxygen saturations between 85 and 90% during the office visit today on 4 L ?of oxygen. I explained that this is really not much different from what we saw last time she was here in fact I believe that she probably needs to ?contact her pulmonologist she does have pulmonary hypertension and this may be an issue at this point. She does have an appointment with her ?cardiologist next week but nonetheless if things get worse she needs to go to the ER. ?Electronic Signature(s) ?Signed: 05/02/2022 7:28:34 AM By: Worthy Keeler PA-C ?Entered By: Worthy Keeler on 05/02/2022 07:28:34 ?MADELLINE, ESHBACH. ([id number]) ?-------------------------------------------------------------------------------- ?Physician Orders Details ?Patient Name: Kari Medina ?Date of Service: 05/01/2022 1:30 PM ?Medical Record Number: [id number] ?Patient Account Number: [id number] ?Date of Birth/Sex: 1946/06/18 (76 y.o. F) ?Treating RN: Levora Dredge ?Primary Care Provider: [name] Other Clinician: ?Referring Provider: [name] ?Treating Provider/Extender: Jeri Cos ?Weeks in Treatment: 5 ?Verbal / Phone Orders: No ?Diagnosis Coding ?ICD-10 Coding ?Code Description ?E11.622 Type 2 diabetes mellitus with other skin ulcer ?L27.517 Non-pressure chronic ulcer of other  part of right lower leg with fat layer exposed ?G01.749 Non-pressure chronic ulcer of other part of left lower leg with fat layer exposed ?I27.29 Other secondary pulmonary hypertension ?I73.00 Raynaud's syndrome without gangrene ?I10 Essential (primary) hypertension ?Follow-up Appointments ?o Return Appointment in 1 month - per pt request ?o Nurse Visit as needed ?Home Health ?[place] ?o ADMIT to Home Health for wound care. May utilize formulary equivalent dressing for wound treatment orders unless ?otherwise specified. Home Health Nurse may visit PRN to address patientos wound care needs. ?o Scheduled days for dressing changes to be completed; exception, patient has scheduled wound care visit that day. ?o **Please direct any NON-WOUND related issues/requests for orders to patient's Primary Care Physician. **If current ?dressing causes regression in wound condition, may D/C ordered dressing product/s and apply Normal Saline Moist ?Dressing daily until next [place] or Other MD appointment. **Notify [facility] of regression in ?wound condition at [phone]. ?Bathing/ Shower/ Hygiene ?o Wash wounds with antibacterial soap and water. ?o May shower with wound dressing protected with water repellent cover or cast protector. ?o No tub bath. ?Edema Control - Lymphedema / Segmental Compressive Device / Other ?o Elevate leg(s) parallel to the floor when sitting. ?o DO YOUR BEST to sleep in the bed at night. DO NOT sleep in your recliner. Long hours of sitting in a recliner leads to ?swelling of the legs and/or potential wounds on your backside. ?Non-Wound Condition ?o Additional non-wound orders/instructions: - Eucerin or cedafil lotion for legs to help with comfort and itching. ?Wound Treatment ?Wound #1 - Lower Leg Wound Laterality: Right, Distal ?Cleanser: Normal Saline 1 x Per Week/30 Days ?Discharge Instructions: Wash your hands with soap and  water. Remove old dressing, discard into plastic bag and place into trash. ?Cleanse the wound with Normal Saline  prior to applying a clean dressing using gauze sponges, not tissues or cotton balls. Do not

## 2022-05-01 NOTE — Progress Notes (Signed)
AAIRA, OESTREICHER ([id number]) ?Visit Report for 05/01/2022 ?Arrival Information Details ?Patient Name: Kari Medina, Kari Medina ?Date of Service: 05/01/2022 1:30 PM ?Medical Record Number: [id number] ?Patient Account Number: [id number] ?Date of Birth/Sex: February 25, 1946 (76 y.o. F) ?Treating RN: Levora Dredge ?Primary Care Julina Altmann: [name] Other Clinician: ?Referring Robynn Marcel: [name] ?Treating Rukiya Hodgkins/Extender: Jeri Cos ?Weeks in Treatment: 5 ?Visit Information History Since Last Visit ?Added or deleted any medications: No ?Patient Arrived: Wheel Chair ?Any new allergies or adverse reactions: No ?Arrival Time: 13:56 ?Had a fall or experienced change in Yes ?Accompanied By: husband ?activities of daily living that may affect ?Transfer Assistance: EasyPivot Patient Lift ?risk of falls: ?Patient Identification Verified: Yes ?Hospitalized since last visit: No ?Secondary Verification Process Completed: Yes ?Pain Present Now: No ?Patient Has Alerts: Yes ?Patient Alerts: Patient on Blood Thinner ?Electronic Signature(s) ?Signed: 05/01/2022 4:39:51 PM By: Levora Dredge ?Entered By: Levora Dredge on 05/01/2022 13:57:10 ?Kari Medina, Kari Medina. ([id number]) ?-------------------------------------------------------------------------------- ?Clinic Level of Care Assessment Details ?Patient Name: Kari Medina, Kari Medina ?Date of Service: 05/01/2022 1:30 PM ?Medical Record Number: [id number] ?Patient Account Number: [id number] ?Date of Birth/Sex: 1946-07-06 (76 y.o. F) ?Treating RN: Levora Dredge ?Primary Care Antonieta Slaven: [name] Other Clinician: ?Referring Chaela Branscum: [name] ?Treating Jarreau Callanan/Extender: Jeri Cos ?Weeks in Treatment: 5 ?Clinic Level of Care Assessment Items ?TOOL 1 Quantity Score ?'[]'$  - Use when EandM and Procedure is performed on INITIAL visit 0 ?ASSESSMENTS - Nursing Assessment / Reassessment ?'[]'$  - General Physical Exam (combine w/ comprehensive assessment (listed just below) when performed on new ?0 ?pt.  evals) ?'[]'$  - 0 ?Comprehensive Assessment (HX, ROS, Risk Assessments, Wounds Hx, etc.) ?ASSESSMENTS - Wound and Skin Assessment / Reassessment ?'[]'$  - Dermatologic / Skin Assessment (not related to wound area) 0 ?ASSESSMENTS - Ostomy and/or Continence Assessment and Care ?'[]'$  - Incontinence Assessment and Management 0 ?'[]'$  - 0 ?Ostomy Care Assessment and Management (repouching, etc.) ?PROCESS - Coordination of Care ?'[]'$  - Simple Patient / Family Education for ongoing care 0 ?'[]'$  - 0 ?Complex (extensive) Patient / Family Education for ongoing care ?'[]'$  - 0 ?Staff obtains Consents, Records, Test Results / Process Orders ?'[]'$  - 0 ?Staff telephones HHA, Nursing Homes / Clarify orders / etc ?'[]'$  - 0 ?Routine Transfer to another Facility (non-emergent condition) ?'[]'$  - 0 ?Routine Hospital Admission (non-emergent condition) ?'[]'$  - 0 ?New Admissions / Biomedical engineer / Ordering NPWT, Apligraf, etc. ?'[]'$  - 0 ?Emergency Hospital Admission (emergent condition) ?PROCESS - Special Needs ?'[]'$  - Pediatric / Minor Patient Management 0 ?'[]'$  - 0 ?Isolation Patient Management ?'[]'$  - 0 ?Hearing / Language / Visual special needs ?'[]'$  - 0 ?Assessment of Community assistance (transportation, D/C planning, etc.) ?'[]'$  - 0 ?Additional assistance / Altered mentation ?'[]'$  - 0 ?Support Surface(s) Assessment (bed, cushion, seat, etc.) ?INTERVENTIONS - Miscellaneous ?'[]'$  - External ear exam 0 ?'[]'$  - 0 ?Patient Transfer (multiple staff / Civil Service fast streamer / Similar devices) ?'[]'$  - 0 ?Simple Staple / Suture removal (25 or less) ?'[]'$  - 0 ?Complex Staple / Suture removal (26 or more) ?'[]'$  - 0 ?Hypo/Hyperglycemic Management (do not check if billed separately) ?'[]'$  - 0 ?Ankle / Brachial Index (ABI) - do not check if billed separately ?Has the patient been seen at the hospital within the last three years: Yes ?Total Score: 0 ?Level Of Care: ____ ?Kari Medina, Kari Medina ([id number]) ?Electronic Signature(s) ?Signed: 05/01/2022 4:39:51 PM By: Levora Dredge ?Entered By: Levora Dredge  on 05/01/2022 14:59:42 ?Kari Medina, Kari Medina. ([id number]) ?-------------------------------------------------------------------------------- ?Compression Therapy Details ?Patient Name: Kari Medina, Kari Medina. ?Date of Service: 05/01/2022  1:30 PM ?Medical Record Number: [id number] ?Patient Account Number: [id number] ?Date of Birth/Sex: 10-10-1946 (76 y.o. F) ?Treating RN: Levora Dredge ?Primary Care Marlaine Arey: [name] Other Clinician: ?Referring Khalon Cansler: [name] ?Treating Jahmeer Porche/Extender: Jeri Cos ?Weeks in Treatment: 5 ?Compression Therapy Performed for Wound Assessment: Wound #1 Right,Distal Lower Leg ?Performed By: Clinician Levora Dredge, RN ?Compression Type: Three Layer ?Post Procedure Diagnosis ?Same as Pre-procedure ?Electronic Signature(s) ?Signed: 05/01/2022 4:39:51 PM By: Levora Dredge ?Entered By: Levora Dredge on 05/01/2022 14:59:31 ?Kari Medina, Kari Medina. ([id number]) ?-------------------------------------------------------------------------------- ?Compression Therapy Details ?Patient Name: Kari Medina, Kari Medina ?Date of Service: 05/01/2022 1:30 PM ?Medical Record Number: [id number] ?Patient Account Number: [id number] ?Date of Birth/Sex: November 10, 1946 (76 y.o. F) ?Treating RN: Levora Dredge ?Primary Care Satoru Milich: [name] Other Clinician: ?Referring Basil Buffin: [name] ?Treating Keyonte Cookston/Extender: Jeri Cos ?Weeks in Treatment: 5 ?Compression Therapy Performed for Wound Assessment: Wound #3 Left,Circumferential Lower Leg ?Performed By: Clinician Levora Dredge, RN ?Compression Type: Three Layer ?Post Procedure Diagnosis ?Same as Pre-procedure ?Electronic Signature(s) ?Signed: 05/01/2022 4:39:51 PM By: Levora Dredge ?Entered By: Levora Dredge on 05/01/2022 14:59:31 ?Kari Medina, Kari Medina. ([id number]) ?-------------------------------------------------------------------------------- ?Encounter Discharge Information Details ?Patient Name: Kari Medina, Kari Medina ?Date of Service: 05/01/2022 1:30 PM ?Medical Record  Number: [id number] ?Patient Account Number: [id number] ?Date of Birth/Sex: Jun 06, 1946 (76 y.o. F) ?Treating RN: Levora Dredge ?Primary Care Gertrue Willette: [name] Other Clinician: ?Referring Timtohy Broski: [name] ?Treating Ermon Sagan/Extender: Jeri Cos ?Weeks in Treatment: 5 ?Encounter Discharge Information Items ?Discharge Condition: Stable ?Ambulatory Status: Wheelchair ?Discharge Destination: Home ?Transportation: Private Auto ?Accompanied By: husband ?Schedule Follow-up Appointment: Yes ?Clinical Summary of Care: Patient Declined ?Electronic Signature(s) ?Signed: 05/01/2022 4:39:51 PM By: Levora Dredge ?Entered By: Levora Dredge on 05/01/2022 15:01:31 ?Kari Medina, Kari Medina. ([id number]) ?-------------------------------------------------------------------------------- ?Lower Extremity Assessment Details ?Patient Name: Kari Medina, Kari Medina ?Date of Service: 05/01/2022 1:30 PM ?Medical Record Number: [id number] ?Patient Account Number: [id number] ?Date of Birth/Sex: 15-Feb-1946 (76 y.o. F) ?Treating RN: Levora Dredge ?Primary Care Prather Failla: [name] Other Clinician: ?Referring Elson Ulbrich: [name] ?Treating Mylo Choi/Extender: Jeri Cos ?Weeks in Treatment: 5 ?Edema Assessment ?Assessed: [Left: No] [Right: No] ?Edema: [Left: Yes] [Right: Yes] ?Calf ?Left: Right: ?Point of Measurement: 32 cm From Medial Instep 43.5 cm 37.5 cm ?Ankle ?Left: Right: ?Point of Measurement: 10 cm From Medial Instep 26 cm 25.5 cm ?Vascular Assessment ?Pulses: ?Dorsalis Pedis ?Palpable: [Left:Yes] [Right:Yes] ?Electronic Signature(s) ?Signed: 05/01/2022 4:39:51 PM By: Levora Dredge ?Entered By: Levora Dredge on 05/01/2022 14:15:42 ?Kari Medina, SILBA. ([id number]) ?-------------------------------------------------------------------------------- ?Multi Wound Chart Details ?Patient Name: JEANIFER, HALLIDAY ?Date of Service: 05/01/2022 1:30 PM ?Medical Record Number: [id number] ?Patient Account Number: [id number] ?Date of Birth/Sex: 30-Apr-1946  (76 y.o. F) ?Treating RN: Levora Dredge ?Primary Care Thursa Emme: [name] Other Clinician: ?Referring Mikya Don: [name] ?Treating Laren Orama/Extender: Jeri Cos ?Weeks in Treatment: 5 ?[facility]

## 2022-05-02 ENCOUNTER — Ambulatory Visit: Payer: Medicare Other | Admitting: Physician Assistant

## 2022-05-05 ENCOUNTER — Ambulatory Visit (INDEPENDENT_AMBULATORY_CARE_PROVIDER_SITE_OTHER): Payer: Medicare Other | Admitting: Family Medicine

## 2022-05-05 ENCOUNTER — Encounter: Payer: Self-pay | Admitting: Family Medicine

## 2022-05-05 VITALS — BP 110/70 | HR 72 | Temp 97.7°F | Ht 64.0 in

## 2022-05-05 DIAGNOSIS — R8290 Unspecified abnormal findings in urine: Secondary | ICD-10-CM | POA: Diagnosis not present

## 2022-05-05 LAB — POC URINALSYSI DIPSTICK (AUTOMATED)
Bilirubin, UA: NEGATIVE
Blood, UA: NEGATIVE
Glucose, UA: NEGATIVE
Ketones, UA: NEGATIVE
Leukocytes, UA: NEGATIVE
Nitrite, UA: POSITIVE
Protein, UA: POSITIVE — AB
Spec Grav, UA: 1.015 (ref 1.010–1.025)
Urobilinogen, UA: 1 E.U./dL
pH, UA: 6 (ref 5.0–8.0)

## 2022-05-05 NOTE — Progress Notes (Signed)
? ? Patient ID: Kari Medina, female    DOB: [birth year], 75 y.o.   MRN: [id number] ? ?This visit was conducted in person. ? ?BP 110/70   Pulse 72   Temp 97.7 ?F (36.5 ?C) (Temporal)   Ht '5\' 4"'$  (1.626 m)   SpO2 93% Comment: 3 L  BMI 37.59 kg/m?   ? ?CC: urine smell ?Subjective:  ? ?HPI: ?Kari Medina is a 76 y.o. female presenting on 05/05/2022 for Urinary Problem (Per husband, pt's urine has foul odor.  Pt denies any urinary sxs but c/o fatigue.  Pt accompanied by husband, Herbie [place]. ) ? ? ?Husband noticed foul odor "for a while" going on for months. Darker urine noticed over the past week. Notes some R sided flank pain previously but none recently. Notes decreased appetite. Notes significant weight loss, attributed to pulmonary hypertension. Notes ongoing nausea over the past year.  ? ?Denies recent dysuria, urgency, frequency, hematuria, fevers/chills, abd pain, flank pain, or vomiting.  ?Chronic urine incontinence - wears depends diaper regularly. ? ?Recently diagnosed with severe pulmonary hypertension, on furosemide and spironolactone. She is on chronic supplemental oxygen at 3L [place].  ?Sees pulm Raul Del) and cards Corky Sox). Has been [facility] to [facility] for pulm HTN.  ?She is also a diabetic. ? ?Last UTI ~3 yrs ago pansensitive E coli - she did not have typical UTI symptoms at that time, was hospitalized for urosepsis.   ? ?Upcoming physical with PCP.  ? ?[name] has a past medical history of Asthma, Chronic hypoxemic respiratory failure ([place]), COPD (chronic obstructive pulmonary disease) ([place]), Depression, Diabetes mellitus without complication ([place]), Dyspnea, Dysrhythmia, Fatty liver, Fibromyalgia, Generalized osteoarthritis of multiple sites, History of hiatal hernia, Hypertension, Hypothyroidism, Melanoma ([place]) (07/2018), OSA (obstructive sleep apnea), Pain, Panic attacks, PONV (postoperative nausea and vomiting), Raynaud disease, RLS (restless legs syndrome), Spleen absent, Tremor, essential, and  Wears dentures. ? ?   ? ?Relevant past medical, surgical, family and social history reviewed and updated as indicated. Interim medical history since our last visit reviewed. ?Allergies and medications reviewed and updated. ?Outpatient Medications Prior to Visit  ?Medication Sig Dispense Refill  ? acetaminophen (TYLENOL) 650 MG CR tablet Take 650 mg by mouth every 8 (eight) hours as needed for pain.    ? albuterol (PROVENTIL HFA) 108 (90 Base) MCG/ACT inhaler Inhale 2 puffs into the lungs every 6 (six) hours as needed for wheezing or shortness of breath. 18 g 2  ? ALPRAZolam (XANAX) 0.25 MG tablet Take 1 tablet (0.25 mg total) by mouth 2 (two) times daily as needed for anxiety. 30 tablet 0  ? aspirin 325 MG EC tablet Take 162.5 mg by mouth daily.    ? budesonide-formoterol (SYMBICORT) 160-4.5 MCG/ACT inhaler Inhale 2 puffs into the lungs 2 (two) times daily. 3 each 1  ? calcium carbonate (OS-CAL) 600 MG TABS Take 600 mg by mouth daily with breakfast.    ? DULoxetine (CYMBALTA) 60 MG capsule TAKE 1 CAPSULE BY MOUTH  DAILY 90 capsule 3  ? furosemide (LASIX) 40 MG tablet TAKE ONE TABLET BY MOUTH DAILY 30 tablet 1  ? glucose blood (ONETOUCH VERIO) test strip Use to check blood sugar once a day. E11.9 100 each 3  ? levothyroxine (SYNTHROID) 200 MCG tablet TAKE 1 TABLET BY MOUTH  DAILY BEFORE BREAKFAST 90 tablet 0  ? metFORMIN (GLUCOPHAGE-XR) 500 MG 24 hr tablet TAKE 1 TABLET BY MOUTH  DAILY WITH BREAKFAST 90 tablet 3  ? montelukast (SINGULAIR) 10 MG tablet TAKE 1 TABLET  BY MOUTH AT  BEDTIME 90 tablet 3  ? ONETOUCH DELICA LANCETS FINE MISC 1 each by Does not apply route daily. Use to check blood sugar once a day. E11.9 100 each 3  ? propranolol (INDERAL) 40 MG tablet TAKE 1 TABLET BY MOUTH  TWICE DAILY 180 tablet 3  ? rOPINIRole (REQUIP) 3 MG tablet Take 1 tablet (3 mg total) by mouth at bedtime. 90 tablet 3  ? silver sulfADIAZINE (SILVADENE) 1 % cream Apply 1 application. topically daily. 50 g 1  ? spironolactone  (ALDACTONE) 25 MG tablet TAKE ONE TABLET BY MOUTH DAILY 30 tablet 1  ? propranolol (INDERAL) 20 MG tablet Take 1 tablet (20 mg total) by mouth 2 (two) times daily. 60 tablet 1  ? ?No facility-administered medications prior to visit.  ?  ? ?Per HPI unless specifically indicated in ROS section below ?Review of Systems ? ?Objective:  ?BP 110/70   Pulse 72   Temp 97.7 ?F (36.5 ?C) (Temporal)   Ht '5\' 4"'$  (1.626 m)   SpO2 93% Comment: 3 L  BMI 37.59 kg/m?   ?Wt Readings from Last 3 Encounters:  ?03/11/22 219 lb (99.3 kg)  ?01/13/22 217 lb (98.4 kg)  ?12/27/21 224 lb (101.6 kg)  ?  ?  ?Physical Exam ?Vitals and nursing note reviewed.  ?Constitutional:   ?   Appearance: She is obese.  ?   Comments: In wheelchair  ?Cardiovascular:  ?   Rate and Rhythm: Normal rate and regular rhythm.  ?   Pulses: Normal pulses.  ?   Heart sounds: Normal heart sounds. No murmur heard. ?Pulmonary:  ?   Effort: Pulmonary effort is normal.  ?   Breath sounds: Normal breath sounds. No wheezing or rales.  ?Abdominal:  ?   General: Bowel sounds are normal. There is no distension.  ?   Palpations: Abdomen is soft. There is no mass.  ?   Tenderness: There is no abdominal tenderness. There is no right CVA tenderness, left CVA tenderness, guarding or rebound.  ?Neurological:  ?   Mental Status: She is alert.  ?Psychiatric:     ?   Mood and Affect: Mood normal.     ?   Behavior: Behavior normal.  ? ?   ?Results for orders placed or performed in visit on 05/05/22  ?POCT Urinalysis Dipstick (Automated)  ?Result Value Ref Range  ? Color, UA dark yellow   ? Clarity, UA cloudy   ? Glucose, UA Negative Negative  ? Bilirubin, UA negative   ? Ketones, UA negative   ? Spec Grav, UA 1.015 1.010 - 1.025  ? Blood, UA negative   ? pH, UA 6.0 5.0 - 8.0  ? Protein, UA Positive (A) Negative  ? Urobilinogen, UA 1.0 0.2 or 1.0 E.U./dL  ? Nitrite, UA positive   ? Leukocytes, UA Negative Negative  ? ?Lab Results  ?Component Value Date  ? HGBA1C 7.2 (H) 10/29/2021  ?   ?Assessment & Plan:  ? ?Problem List Items Addressed This Visit   ? ? Foul smelling urine - Primary  ?  Several months of foul smelling urine associated with a week of darker urine, without typical UTI symptoms at this time however in h/o UTI with urosepsis with minimal preceding symptoms (2019).  ?Urine microscopy today with large bacteria, but no significant WBC or RBC/hpf.  ??colonization.  ?Check UCx. If bacterial growth, low threshold to treat.  ?In interim, recommended increased water intake, avoid caffeine/dark sodas and other bladder  irritants. Pt/husband agree with plan.  ? ?  ?  ? Relevant Orders  ? POCT Urinalysis Dipstick (Automated) (Completed)  ? Urine Culture  ?  ? ?No orders of the defined types were placed in this encounter. ? ?Orders Placed This Encounter  ?Procedures  ? Urine Culture  ? POCT Urinalysis Dipstick (Automated)  ? ? ? ?Patient Instructions  ?I'm not convinced this is a UTI - I have sent a culture. ?We will be in touch with results.  ?Try to stay well hydrated with plenty of water, avoid bladder irritants like spicy foods, dark sodas, caffeine. ?Keep appointment next month with Dr Silvio Pate.  ? ?Follow up plan: ?Return if symptoms worsen or fail to improve. ? ?Ria Bush, MD   ?

## 2022-05-05 NOTE — Assessment & Plan Note (Signed)
Several months of foul smelling urine associated with a week of darker urine, without typical UTI symptoms at this time however in h/o UTI with urosepsis with minimal preceding symptoms (2019).  ?Urine microscopy today with large bacteria, but no significant WBC or RBC/hpf.  ??colonization.  ?Check UCx. If bacterial growth, low threshold to treat.  ?In interim, recommended increased water intake, avoid caffeine/dark sodas and other bladder irritants. Pt/husband agree with plan.  ?

## 2022-05-05 NOTE — Patient Instructions (Addendum)
I'm not convinced this is a UTI - I have sent a culture. ?We will be in touch with results.  ?Try to stay well hydrated with plenty of water, avoid bladder irritants like spicy foods, dark sodas, caffeine. ?Keep appointment next month with Dr Silvio Pate.  ?

## 2022-05-07 LAB — URINE CULTURE
MICRO NUMBER:: 13396316
SPECIMEN QUALITY:: ADEQUATE

## 2022-05-08 ENCOUNTER — Other Ambulatory Visit: Payer: Self-pay | Admitting: Family Medicine

## 2022-05-08 MED ORDER — SULFAMETHOXAZOLE-TRIMETHOPRIM 800-160 MG PO TABS: 1.0000 | ORAL_TABLET | Freq: Two times a day (BID) | ORAL | 0 refills | Status: DC

## 2022-05-09 ENCOUNTER — Telehealth: Payer: Self-pay | Admitting: Internal Medicine

## 2022-05-09 ENCOUNTER — Ambulatory Visit: Payer: Medicare Other | Admitting: Internal Medicine

## 2022-05-09 DIAGNOSIS — E11622 Type 2 diabetes mellitus with other skin ulcer: Secondary | ICD-10-CM | POA: Diagnosis not present

## 2022-05-09 DIAGNOSIS — E1140 Type 2 diabetes mellitus with diabetic neuropathy, unspecified: Secondary | ICD-10-CM | POA: Diagnosis not present

## 2022-05-09 DIAGNOSIS — I890 Lymphedema, not elsewhere classified: Secondary | ICD-10-CM | POA: Diagnosis not present

## 2022-05-09 DIAGNOSIS — L97822 Non-pressure chronic ulcer of other part of left lower leg with fat layer exposed: Secondary | ICD-10-CM | POA: Diagnosis not present

## 2022-05-09 DIAGNOSIS — E1151 Type 2 diabetes mellitus with diabetic peripheral angiopathy without gangrene: Secondary | ICD-10-CM | POA: Diagnosis not present

## 2022-05-09 DIAGNOSIS — L97812 Non-pressure chronic ulcer of other part of right lower leg with fat layer exposed: Secondary | ICD-10-CM | POA: Diagnosis not present

## 2022-05-10 DIAGNOSIS — I10 Essential (primary) hypertension: Secondary | ICD-10-CM | POA: Diagnosis not present

## 2022-05-10 DIAGNOSIS — Z87891 Personal history of nicotine dependence: Secondary | ICD-10-CM | POA: Diagnosis not present

## 2022-05-10 DIAGNOSIS — E039 Hypothyroidism, unspecified: Secondary | ICD-10-CM | POA: Diagnosis not present

## 2022-05-10 DIAGNOSIS — E1140 Type 2 diabetes mellitus with diabetic neuropathy, unspecified: Secondary | ICD-10-CM | POA: Diagnosis not present

## 2022-05-10 DIAGNOSIS — I2729 Other secondary pulmonary hypertension: Secondary | ICD-10-CM | POA: Diagnosis not present

## 2022-05-10 DIAGNOSIS — Z7951 Long term (current) use of inhaled steroids: Secondary | ICD-10-CM | POA: Diagnosis not present

## 2022-05-10 DIAGNOSIS — M797 Fibromyalgia: Secondary | ICD-10-CM | POA: Diagnosis not present

## 2022-05-10 DIAGNOSIS — Z9981 Dependence on supplemental oxygen: Secondary | ICD-10-CM | POA: Diagnosis not present

## 2022-05-10 DIAGNOSIS — J449 Chronic obstructive pulmonary disease, unspecified: Secondary | ICD-10-CM | POA: Diagnosis not present

## 2022-05-10 DIAGNOSIS — L97812 Non-pressure chronic ulcer of other part of right lower leg with fat layer exposed: Secondary | ICD-10-CM | POA: Diagnosis not present

## 2022-05-10 DIAGNOSIS — E1151 Type 2 diabetes mellitus with diabetic peripheral angiopathy without gangrene: Secondary | ICD-10-CM | POA: Diagnosis not present

## 2022-05-10 DIAGNOSIS — Z4800 Encounter for change or removal of nonsurgical wound dressing: Secondary | ICD-10-CM | POA: Diagnosis not present

## 2022-05-10 DIAGNOSIS — G4730 Sleep apnea, unspecified: Secondary | ICD-10-CM | POA: Diagnosis not present

## 2022-05-10 DIAGNOSIS — L97822 Non-pressure chronic ulcer of other part of left lower leg with fat layer exposed: Secondary | ICD-10-CM | POA: Diagnosis not present

## 2022-05-10 DIAGNOSIS — M1990 Unspecified osteoarthritis, unspecified site: Secondary | ICD-10-CM | POA: Diagnosis not present

## 2022-05-10 DIAGNOSIS — E11622 Type 2 diabetes mellitus with other skin ulcer: Secondary | ICD-10-CM | POA: Diagnosis not present

## 2022-05-10 DIAGNOSIS — I890 Lymphedema, not elsewhere classified: Secondary | ICD-10-CM | POA: Diagnosis not present

## 2022-05-14 DIAGNOSIS — L97812 Non-pressure chronic ulcer of other part of right lower leg with fat layer exposed: Secondary | ICD-10-CM | POA: Diagnosis not present

## 2022-05-14 DIAGNOSIS — I890 Lymphedema, not elsewhere classified: Secondary | ICD-10-CM | POA: Diagnosis not present

## 2022-05-14 DIAGNOSIS — E1140 Type 2 diabetes mellitus with diabetic neuropathy, unspecified: Secondary | ICD-10-CM | POA: Diagnosis not present

## 2022-05-14 DIAGNOSIS — E11622 Type 2 diabetes mellitus with other skin ulcer: Secondary | ICD-10-CM | POA: Diagnosis not present

## 2022-05-14 DIAGNOSIS — E1151 Type 2 diabetes mellitus with diabetic peripheral angiopathy without gangrene: Secondary | ICD-10-CM | POA: Diagnosis not present

## 2022-05-14 DIAGNOSIS — L97822 Non-pressure chronic ulcer of other part of left lower leg with fat layer exposed: Secondary | ICD-10-CM | POA: Diagnosis not present

## 2022-05-16 ENCOUNTER — Other Ambulatory Visit: Payer: Self-pay | Admitting: Internal Medicine

## 2022-05-20 ENCOUNTER — Other Ambulatory Visit
Admission: RE | Admit: 2022-05-20 | Discharge: 2022-05-20 | Disposition: A | Discharge status: Home / Self Care | Payer: Medicare Other | Source: Ambulatory Visit | Attending: Cardiology | Admitting: Cardiology

## 2022-05-20 DIAGNOSIS — I5030 Unspecified diastolic (congestive) heart failure: Secondary | ICD-10-CM | POA: Diagnosis not present

## 2022-05-20 DIAGNOSIS — I2722 Pulmonary hypertension due to left heart disease: Secondary | ICD-10-CM | POA: Insufficient documentation

## 2022-05-20 DIAGNOSIS — I10 Essential (primary) hypertension: Secondary | ICD-10-CM | POA: Diagnosis not present

## 2022-05-20 LAB — BRAIN NATRIURETIC PEPTIDE: B Natriuretic Peptide: 1315.4 pg/mL — ABNORMAL HIGH (ref 0.0–100.0)

## 2022-05-22 DIAGNOSIS — I890 Lymphedema, not elsewhere classified: Secondary | ICD-10-CM | POA: Diagnosis not present

## 2022-05-22 DIAGNOSIS — E1140 Type 2 diabetes mellitus with diabetic neuropathy, unspecified: Secondary | ICD-10-CM | POA: Diagnosis not present

## 2022-05-22 DIAGNOSIS — E1151 Type 2 diabetes mellitus with diabetic peripheral angiopathy without gangrene: Secondary | ICD-10-CM | POA: Diagnosis not present

## 2022-05-22 DIAGNOSIS — L97822 Non-pressure chronic ulcer of other part of left lower leg with fat layer exposed: Secondary | ICD-10-CM | POA: Diagnosis not present

## 2022-05-22 DIAGNOSIS — E11622 Type 2 diabetes mellitus with other skin ulcer: Secondary | ICD-10-CM | POA: Diagnosis not present

## 2022-05-22 DIAGNOSIS — L97812 Non-pressure chronic ulcer of other part of right lower leg with fat layer exposed: Secondary | ICD-10-CM | POA: Diagnosis not present

## 2022-05-23 ENCOUNTER — Telehealth: Payer: Self-pay

## 2022-05-23 MED ORDER — LEVOTHYROXINE SODIUM 200 MCG PO TABS: 200.0000 ug | ORAL_TABLET | Freq: Every day | ORAL | 0 refills | Status: DC

## 2022-05-23 NOTE — Telephone Encounter (Signed)
Rx sent electronically.  

## 2022-05-28 NOTE — Telephone Encounter (Signed)
Received fax thru [address] for refills on spironolactone and furosemide [facility]. I spoke with pt to let her know both meds were refilled at [facility] on 05/16/22. Pt voiced understanding and nothing further needed.

## 2022-05-29 ENCOUNTER — Encounter: Payer: Medicare Other | Attending: Physician Assistant | Admitting: Physician Assistant

## 2022-05-29 DIAGNOSIS — I7300 Raynaud's syndrome without gangrene: Secondary | ICD-10-CM | POA: Diagnosis not present

## 2022-05-29 DIAGNOSIS — E11621 Type 2 diabetes mellitus with foot ulcer: Secondary | ICD-10-CM | POA: Insufficient documentation

## 2022-05-29 DIAGNOSIS — I10 Essential (primary) hypertension: Secondary | ICD-10-CM | POA: Insufficient documentation

## 2022-05-29 DIAGNOSIS — I2729 Other secondary pulmonary hypertension: Secondary | ICD-10-CM | POA: Diagnosis not present

## 2022-05-29 DIAGNOSIS — L97822 Non-pressure chronic ulcer of other part of left lower leg with fat layer exposed: Secondary | ICD-10-CM | POA: Insufficient documentation

## 2022-05-29 DIAGNOSIS — L97812 Non-pressure chronic ulcer of other part of right lower leg with fat layer exposed: Secondary | ICD-10-CM | POA: Insufficient documentation

## 2022-05-29 DIAGNOSIS — E11622 Type 2 diabetes mellitus with other skin ulcer: Secondary | ICD-10-CM | POA: Insufficient documentation

## 2022-05-29 NOTE — Progress Notes (Addendum)
Kari Medina, Kari Medina ([id number]) Visit Report for 05/29/2022 Chief Complaint Document Details Patient Name: Kari Medina, Kari Medina Date of Service: 05/29/2022 2:15 PM Medical Record Number: [id number] Patient Account Number: [id number] Date of Birth/Sex: [birth year] (76 y.o. F) Treating RN: Primary Care Provider: [name] Other Clinician: Referring Provider: [name] Treating Provider/Extender: Skipper Cliche in Treatment: 9 Information Obtained from: Patient Chief Complaint Bilateral LE Ulcers Electronic Signature(s) Signed: 05/29/2022 2:30:42 PM By: Worthy Keeler PA-C Entered By: Worthy Keeler on 05/29/2022 14:30:41 Wadding, Kari Medina ([id number]) -------------------------------------------------------------------------------- HPI Details Patient Name: Kari Medina Date of Service: 05/29/2022 2:15 PM Medical Record Number: [id number] Patient Account Number: [id number] Date of Birth/Sex: 1946/02/01 (76 y.o. F) Treating RN: Primary Care Provider: [name] Other Clinician: Referring Provider: [name] Treating Provider/Extender: Skipper Cliche in Treatment: 9 History of Present Illness HPI Description: 03-27-2022 upon evaluation today patient presents for initial evaluation here in the clinic concerning issues she has been having with her wounds on her legs. Subsequently she is telling me that they burn and do hurt quite a bit. Unfortunately she is not really in the state of mind she tells me to allow for any sharp debridement for that reason were probably can have to try something different from the standpoint of topical medications and under wrap in order to try to get this under control. Fortunately I do believe this is good to be something we can likely heal but again it absolutely has to involve getting the swelling down if working to see any signs of improvement. The patient in particular tells me this started December 2022 while she was in the hospital. She does go to  [facility] in March they sent her here. Currently she has been using Silvadene cream which is really not helping. She did have some falls not too far back where she did have some broken ribs with bruising. The patient does have a history of diabetes mellitus type 2 with a hemoglobin A1c of 7.23 October 2021 most recent. She also has a history of pulmonary hypertension, Raynaud's syndrome without gangrene, and hypertension in general. 05-01-2022 upon evaluation today patient appears to be doing okay in regard to her wounds. She has been tolerating the Iodoflex without significant complication. With that being said she does tell me that it burns somewhat but she is able to tolerate this they have gotten some ice packs that she uses which seems to soothe things to some degree. This is good news. With that being said I do not see any evidence of active infection locally or systemically at this time. She does note that it is really can be hard for her to come back and be seen frequently she request at least a 1 month check in between each time and to let home health manage things in between. 05-29-2022 upon evaluation today patient appears to be doing well currently in regard to her wound she is definitely showing signs of improvement which is great news. Fortunately I do not see any evidence of active infection locally or systemically which is also great news. No fevers, chills, nausea, vomiting, or diarrhea. Electronic Signature(s) Signed: 05/29/2022 4:06:51 PM By: Worthy Keeler PA-C Entered By: Worthy Keeler on 05/29/2022 16:06:51 [name], Kari Medina ([id number]) -------------------------------------------------------------------------------- Physical Exam Details Patient Name: Kari Medina Date of Service: 05/29/2022 2:15 PM Medical Record Number: [id number] Patient Account Number: [id number] Date of Birth/Sex: 1946-06-30 (76 y.o. F) Treating RN: Levora Dredge Primary Care Provider:  [name] Other Clinician: Massie Kluver Referring Provider: [name] Treating Provider/Extender: Skipper Cliche in Treatment: 9 Constitutional Well-nourished and well-hydrated in no acute distress. Respiratory normal breathing without difficulty. Psychiatric this patient is able to make decisions and demonstrates good insight into disease process. Alert and Oriented x 3. pleasant and cooperative. Notes Upon inspection patient's wound bed showed signs of good granulation and epithelization at this point. Fortunately I do not see any signs of active infection at this time which is great and overall I think we are on the right track here. No fevers, chills, nausea, vomiting, or diarrhea. Electronic Signature(s) Signed: 05/29/2022 4:07:19 PM By: Worthy Keeler PA-C Entered By: Worthy Keeler on 05/29/2022 16:07:18 Kari Medina, Kari Medina ([id number]) -------------------------------------------------------------------------------- Physician Orders Details Patient Name: Kari Medina Date of Service: 05/29/2022 2:15 PM Medical Record Number: [id number] Patient Account Number: [id number] Date of Birth/Sex: January 03, 1946 (76 y.o. F) Treating RN: Levora Dredge Primary Care Provider: [name] Other Clinician: Massie Kluver Referring Provider: [name] Treating Provider/Extender: Skipper Cliche in Treatment: 9 Verbal / Phone Orders: No Diagnosis Coding ICD-10 Coding Code Description E11.622 Type 2 diabetes mellitus with other skin ulcer L97.812 Non-pressure chronic ulcer of other part of right lower leg with fat layer exposed L97.822 Non-pressure chronic ulcer of other part of left lower leg with fat layer exposed I27.29 Other secondary pulmonary hypertension I73.00 Raynaud's syndrome without gangrene I10 Essential (primary) hypertension Follow-up Appointments o Return Appointment in 1 month - per pt request o Nurse Visit as needed [place]: - [facility] o ADMIT to Home Health for wound care. May utilize formulary equivalent dressing for wound treatment orders unless otherwise specified. Home Health Nurse may visit PRN to address patientos wound care needs. o Scheduled days for dressing changes to be completed; exception, patient has scheduled wound care visit that day. o **Please direct any NON-WOUND related issues/requests for orders to patient's Primary Care Physician. **If current dressing causes regression in wound condition, may D/C ordered dressing product/s and apply Normal Saline Moist Dressing daily until next [place] or Other MD appointment. **Notify [facility] of regression in wound condition at [phone]. Bathing/ Shower/ Hygiene o Wash wounds with antibacterial soap and water. o May shower with wound dressing protected with water repellent cover or cast protector. o No tub bath. Edema Control - Lymphedema / Segmental Compressive Device / Other o Tubigrip single layer applied. - apply to each leg o Elevate leg(s) parallel to the floor when sitting. o DO YOUR BEST to sleep in the bed at night. DO NOT sleep in your recliner. Long hours of sitting in a recliner leads to swelling of the legs and/or potential wounds on your backside. Non-Wound Condition o Additional non-wound orders/instructions: - Eucerin or cedafil lotion for legs to help with comfort and itching. Wound Treatment Wound #3 - Lower Leg Wound Laterality: Left, Circumferential Cleanser: Normal Saline 1 x Per Week/30 Days Discharge Instructions: Wash your hands with soap and water. Remove old dressing, discard into plastic bag and place into trash. Cleanse the wound with Normal Saline prior to applying a clean dressing using gauze sponges, not tissues or cotton balls. Do not scrub or use excessive force. Pat dry using gauze sponges, not tissue or cotton balls. Cleanser: Soap and Water 1 x Per  Week/30 Days Discharge Instructions: Gently cleanse wound with antibacterial soap, rinse and pat dry prior to dressing wounds Primary Dressing: Iodosorb 40 (g) 1 x Per Week/30  Days Discharge Instructions: Applied only to two wounds to the midline leg and back of leg. Secondary Dressing: ABD Pad 5x9 (in/in) 1 x Per Week/30 Days Discharge Instructions: Cover with ABD pad Secured With: Tubigrip Size D, 3x10 (in/yd) 1 x Per Week/30 Days Discharge Instructions: Apply 3 Tubigrip D 3-finger-widths below knee to base of toes to secure dressing and/or for swelling. Kari Medina, Kari Medina ([id number]) Wound #4 - Lower Leg Wound Laterality: Right Cleanser: Normal Saline 1 x Per Week/30 Days Discharge Instructions: Wash your hands with soap and water. Remove old dressing, discard into plastic bag and place into trash. Cleanse the wound with Normal Saline prior to applying a clean dressing using gauze sponges, not tissues or cotton balls. Do not scrub or use excessive force. Pat dry using gauze sponges, not tissue or cotton balls. Cleanser: Soap and Water 1 x Per Week/30 Days Discharge Instructions: Gently cleanse wound with antibacterial soap, rinse and pat dry prior to dressing wounds Primary Dressing: Iodosorb 40 (g) 1 x Per Week/30 Days Discharge Instructions: Applied only to two wounds to the midline leg and back of leg. Secondary Dressing: ABD Pad 5x9 (in/in) 1 x Per Week/30 Days Discharge Instructions: Cover with ABD pad Secured With: Tubigrip Size D, 3x10 (in/yd) 1 x Per Week/30 Days Discharge Instructions: Apply 3 Tubigrip D 3-finger-widths below knee to base of toes to secure dressing and/or for swelling. Wound #5 - Lower Leg Wound Laterality: Left, Posterior Cleanser: Normal Saline 1 x Per Week/30 Days Discharge Instructions: Wash your hands with soap and water. Remove old dressing, discard into plastic bag and place into trash. Cleanse the wound with Normal Saline prior to applying a clean dressing  using gauze sponges, not tissues or cotton balls. Do not scrub or use excessive force. Pat dry using gauze sponges, not tissue or cotton balls. Cleanser: Soap and Water 1 x Per Week/30 Days Discharge Instructions: Gently cleanse wound with antibacterial soap, rinse and pat dry prior to dressing wounds Primary Dressing: Iodosorb 40 (g) 1 x Per Week/30 Days Discharge Instructions: Applied only to two wounds to the midline leg and back of leg. Secondary Dressing: ABD Pad 5x9 (in/in) 1 x Per Week/30 Days Discharge Instructions: Cover with ABD pad Secured With: Tubigrip Size D, 3x10 (in/yd) 1 x Per Week/30 Days Discharge Instructions: Apply 3 Tubigrip D 3-finger-widths below knee to base of toes to secure dressing and/or for swelling. Electronic Signature(s) Signed: 05/29/2022 4:11:43 PM By: Worthy Keeler PA-C Signed: 05/30/2022 4:23:30 PM By: Massie Kluver Entered By: Massie Kluver on 05/29/2022 15:48:57 Kari Medina, Kari Medina ([id number]) -------------------------------------------------------------------------------- Problem List Details Patient Name: Kari Medina Date of Service: 05/29/2022 2:15 PM Medical Record Number: [id number] Patient Account Number: [id number] Date of Birth/Sex: [birth year] (75 y.o. F) Treating RN: Primary Care Provider: [name] Other Clinician: Referring Provider: [name] Treating Provider/Extender: Skipper Cliche in Treatment: 9 Active Problems ICD-10 Encounter Code Description Active Date MDM Diagnosis E11.622 Type 2 diabetes mellitus with other skin ulcer 03/27/2022 No Yes L97.812 Non-pressure chronic ulcer of other part of right lower leg with fat layer 03/27/2022 No Yes exposed L97.822 Non-pressure chronic ulcer of other part of left lower leg with fat layer 03/27/2022 No Yes exposed I27.29 Other secondary pulmonary hypertension 03/27/2022 No Yes I73.00 Raynaud's syndrome without gangrene 03/27/2022 No Yes I10 Essential (primary) hypertension 03/27/2022 No  Yes Inactive Problems Resolved Problems Electronic Signature(s) Signed: 05/29/2022 2:30:38 PM By: Worthy Keeler PA-C Entered By: Worthy Keeler on 05/29/2022 14:30:38 Kari Medina, Kari E. (  [id number]) -------------------------------------------------------------------------------- Progress Note Details Patient Name: JERMEKA, SCHLOTTERBECK Date of Service: 05/29/2022 2:15 PM Medical Record Number: [id number] Patient Account Number: [id number] Date of Birth/Sex: 07/14/1946 (76 y.o. F) Treating RN: Levora Dredge Primary Care Provider: [name] Other Clinician: Massie Kluver Referring Provider: [name] Treating Provider/Extender: Skipper Cliche in Treatment: 9 Subjective Chief Complaint Information obtained from Patient Bilateral LE Ulcers History of Present Illness (HPI) 03-27-2022 upon evaluation today patient presents for initial evaluation here in the clinic concerning issues she has been having with her wounds on her legs. Subsequently she is telling me that they burn and do hurt quite a bit. Unfortunately she is not really in the state of mind she tells me to allow for any sharp debridement for that reason were probably can have to try something different from the standpoint of topical medications and under wrap in order to try to get this under control. Fortunately I do believe this is good to be something we can likely heal but again it absolutely has to involve getting the swelling down if working to see any signs of improvement. The patient in particular tells me this started December 2022 while she was in the hospital. She does go to [facility] in March they sent her here. Currently she has been using Silvadene cream which is really not helping. She did have some falls not too far back where she did have some broken ribs with bruising. The patient does have a history of diabetes mellitus type 2 with a hemoglobin A1c of 7.23 October 2021 most recent. She also has a  history of pulmonary hypertension, Raynaud's syndrome without gangrene, and hypertension in general. 05-01-2022 upon evaluation today patient appears to be doing okay in regard to her wounds. She has been tolerating the Iodoflex without significant complication. With that being said she does tell me that it burns somewhat but she is able to tolerate this they have gotten some ice packs that she uses which seems to soothe things to some degree. This is good news. With that being said I do not see any evidence of active infection locally or systemically at this time. She does note that it is really can be hard for her to come back and be seen frequently she request at least a 1 month check in between each time and to let home health manage things in between. 05-29-2022 upon evaluation today patient appears to be doing well currently in regard to her wound she is definitely showing signs of improvement which is great news. Fortunately I do not see any evidence of active infection locally or systemically which is also great news. No fevers, chills, nausea, vomiting, or diarrhea. Objective Constitutional Well-nourished and well-hydrated in no acute distress. Vitals Time Taken: 2:49 PM, Temperature: 98.0 F, Pulse: 94 bpm, Respiratory Rate: 18 breaths/min, Blood Pressure: 110/76 mmHg. Respiratory normal breathing without difficulty. Psychiatric this patient is able to make decisions and demonstrates good insight into disease process. Alert and Oriented x 3. pleasant and cooperative. General Notes: Upon inspection patient's wound bed showed signs of good granulation and epithelization at this point. Fortunately I do not see any signs of active infection at this time which is great and overall I think we are on the right track here. No fevers, chills, nausea, vomiting, or diarrhea. Integumentary (Hair, Skin) Wound #1 status is Healed - Epithelialized. Original cause of wound was Gradually Appeared. The  date acquired was: 12/05/2021. The wound has been in treatment 9  weeks. The wound is located on the Right,Distal Lower Leg. There is a medium amount of serosanguineous drainage noted. Wound #3 status is Open. Original cause of wound was Gradually Appeared. The date acquired was: 12/05/2021. The wound has been in treatment 9 weeks. The wound is located on the Left,Circumferential Lower Leg. The wound measures 3.9cm length x 1.9cm width x 0.1cm depth; 5.82cm^2 area and 0.582cm^3 volume. There is Fat Layer (Subcutaneous Tissue) exposed. There is a medium amount of serosanguineous drainage noted. There is small (1-33%) red, pink granulation within the wound bed. There is a large (67-100%) amount of necrotic tissue within the wound bed including Adherent Slough. Kari Medina, Kari Medina ([id number]) Wound #4 status is Open. Original cause of wound was Gradually Appeared. The date acquired was: 05/01/2022. The wound has been in treatment 4 weeks. The wound is located on the Right Lower Leg. The wound measures 2.1cm length x 2cm width x 0.4cm depth; 3.299cm^2 area and 1.319cm^3 volume. There is Fat Layer (Subcutaneous Tissue) exposed. There is a medium amount of serosanguineous drainage noted. There is small (1-33%) red granulation within the wound bed. There is a medium (34-66%) amount of necrotic tissue within the wound bed including Adherent Slough. Wound #5 status is Open. Original cause of wound was Shear/Friction. The date acquired was: 01/23/2022. The wound is located on the Left,Posterior Lower Leg. The wound measures 1.4cm length x 0.7cm width x 0.1cm depth; 0.77cm^2 area and 0.077cm^3 volume. There is Fat Layer (Subcutaneous Tissue) exposed. There is no tunneling or undermining noted. There is a medium amount of serosanguineous drainage noted. There is small (1-33%) red granulation within the wound bed. There is a small (1-33%) amount of necrotic tissue within the wound bed including Adherent  Slough. Assessment Active Problems ICD-10 Type 2 diabetes mellitus with other skin ulcer Non-pressure chronic ulcer of other part of right lower leg with fat layer exposed Non-pressure chronic ulcer of other part of left lower leg with fat layer exposed Other secondary pulmonary hypertension Raynaud's syndrome without gangrene Essential (primary) hypertension Plan Follow-up Appointments: Return Appointment in 1 month - per pt request Nurse Visit as needed Home Health: [place]: - [facility] ADMIT to [place] for wound care. May utilize formulary equivalent dressing for wound treatment orders unless otherwise specified. Home Health Nurse may visit PRN to address patient s wound care needs. Scheduled days for dressing changes to be completed; exception, patient has scheduled wound care visit that day. **Please direct any NON-WOUND related issues/requests for orders to patient's Primary Care Physician. **If current dressing causes regression in wound condition, may D/C ordered dressing product/s and apply Normal Saline Moist Dressing daily until next [place] or Other MD appointment. **Notify [facility] of regression in wound condition at [phone]. Bathing/ Shower/ Hygiene: Wash wounds with antibacterial soap and water. May shower with wound dressing protected with water repellent cover or cast protector. No tub bath. Edema Control - Lymphedema / Segmental Compressive Device / Other: Tubigrip single layer applied. - apply to each leg Elevate leg(s) parallel to the floor when sitting. DO YOUR BEST to sleep in the bed at night. DO NOT sleep in your recliner. Long hours of sitting in a recliner leads to swelling of the legs and/or potential wounds on your backside. Non-Wound Condition: Additional non-wound orders/instructions: - Eucerin or cedafil lotion for legs to help with comfort and itching. WOUND #3: - Lower Leg Wound Laterality: Left,  Circumferential Cleanser: Normal Saline 1 x Per Week/30 Days Discharge  Instructions: Wash your hands with soap and water. Remove old dressing, discard into plastic bag and place into trash. Cleanse the wound with Normal Saline prior to applying a clean dressing using gauze sponges, not tissues or cotton balls. Do not scrub or use excessive force. Pat dry using gauze sponges, not tissue or cotton balls. Cleanser: Soap and Water 1 x Per Week/30 Days Discharge Instructions: Gently cleanse wound with antibacterial soap, rinse and pat dry prior to dressing wounds Primary Dressing: Iodosorb 40 (g) 1 x Per Week/30 Days Discharge Instructions: Applied only to two wounds to the midline leg and back of leg. Secondary Dressing: ABD Pad 5x9 (in/in) 1 x Per Week/30 Days Discharge Instructions: Cover with ABD pad Secured With: Tubigrip Size D, 3x10 (in/yd) 1 x Per Week/30 Days Discharge Instructions: Apply 3 Tubigrip D 3-finger-widths below knee to base of toes to secure dressing and/or for swelling. WOUND #4: - Lower Leg Wound Laterality: Right Cleanser: Normal Saline 1 x Per Week/30 Days Discharge Instructions: Wash your hands with soap and water. Remove old dressing, discard into plastic bag and place into trash. Cleanse the wound with Normal Saline prior to applying a clean dressing using gauze sponges, not tissues or cotton balls. Do not scrub or use excessive force. Pat dry using gauze sponges, not tissue or cotton balls. Cleanser: Soap and Water 1 x Per Week/30 Days Discharge Instructions: Gently cleanse wound with antibacterial soap, rinse and pat dry prior to dressing wounds Primary Dressing: Iodosorb 40 (g) 1 x Per Week/30 Days Galindo, Auna E. ([id number]) Discharge Instructions: Applied only to two wounds to the midline leg and back of leg. Secondary Dressing: ABD Pad 5x9 (in/in) 1 x Per Week/30 Days Discharge Instructions: Cover with ABD pad Secured With: Tubigrip Size D, 3x10 (in/yd) 1 x Per  Week/30 Days Discharge Instructions: Apply 3 Tubigrip D 3-finger-widths below knee to base of toes to secure dressing and/or for swelling. WOUND #5: - Lower Leg Wound Laterality: Left, Posterior Cleanser: Normal Saline 1 x Per Week/30 Days Discharge Instructions: Wash your hands with soap and water. Remove old dressing, discard into plastic bag and place into trash. Cleanse the wound with Normal Saline prior to applying a clean dressing using gauze sponges, not tissues or cotton balls. Do not scrub or use excessive force. Pat dry using gauze sponges, not tissue or cotton balls. Cleanser: Soap and Water 1 x Per Week/30 Days Discharge Instructions: Gently cleanse wound with antibacterial soap, rinse and pat dry prior to dressing wounds Primary Dressing: Iodosorb 40 (g) 1 x Per Week/30 Days Discharge Instructions: Applied only to two wounds to the midline leg and back of leg. Secondary Dressing: ABD Pad 5x9 (in/in) 1 x Per Week/30 Days Discharge Instructions: Cover with ABD pad Secured With: Tubigrip Size D, 3x10 (in/yd) 1 x Per Week/30 Days Discharge Instructions: Apply 3 Tubigrip D 3-finger-widths below knee to base of toes to secure dressing and/or for swelling. 1. I am get a recommend that we go ahead and continue with the wound care measures as before and the patient is in agreement with the plan this includes the use of the Iodoflex/Iodosorb which I do believe is doing well. 2. I am also can recommend that we have the patient continue to monitor for any signs of infection. If she experiences any changes she should let me know but my hope is that she will do much better with the Tubigrip. The compression wraps just were not doing well for her to be honest and  with the Raynaud's I think this which is complicating the situation. We will see patient back for reevaluation in 1 week here in the clinic. If anything worsens or changes patient will contact our office for  additional recommendations. Electronic Signature(s) Signed: 05/29/2022 4:08:18 PM By: Worthy Keeler PA-C Entered By: Worthy Keeler on 05/29/2022 16:08:18 Kari Medina, Kari Medina ([id number]) -------------------------------------------------------------------------------- SuperBill Details Patient Name: Kari Medina Date of Service: 05/29/2022 Medical Record Number: [id number] Patient Account Number: [id number] Date of Birth/Sex: [birth year] (75 y.o. 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 leg with fat layer exposed L97.822 Non-pressure chronic ulcer of other part of left lower leg with fat layer exposed I27.29 Other secondary pulmonary hypertension I73.00 Raynaud's syndrome without gangrene I10 Essential (primary) hypertension Facility Procedures CPT4 Code: [id number] Description: 99214 - WOUND CARE VISIT-LEV 4 EST PT Modifier: Quantity: 1 Physician Procedures CPT4 Code: [id number] Description: 99213 - WC PHYS LEVEL 3 - EST PT Modifier: Quantity: 1 CPT4 Code: Description: ICD-10 Diagnosis Description E11.622 Type 2 diabetes mellitus with other 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

## 2022-05-29 NOTE — Progress Notes (Addendum)
KEYETTA, HOLLINGWORTH ([id number]) Visit Report for 05/29/2022 Arrival Information Details Patient Name: Kari Medina, Kari Medina Date of Service: 05/29/2022 2:15 PM Medical Record Number: [id number] Patient Account Number: [id number] Date of Birth/Sex: [birth year] (76 y.o. F) Treating RN: Levora Dredge Primary Care Elvia Aydin: [name] Other Clinician: Massie Kluver Referring Karisa Nesser: [name] Treating Renita Brocks/Extender: Skipper Cliche in Treatment: 9 Visit Information History Since Last Visit All ordered tests and consults were completed: No Patient Arrived: Wheel Chair Any new allergies or adverse reactions: No Arrival Time: 14:48 Had a fall or experienced change in No Transfer Assistance: EasyPivot Patient Lift activities of daily living that may affect Patient Has Alerts: Yes risk of falls: Patient Alerts: Patient on Blood Thinner Hospitalized since last visit: No Pain Present Now: Yes Electronic Signature(s) Signed: 05/30/2022 4:23:30 PM By: Massie Kluver Entered By: Massie Kluver on 05/29/2022 14:49:24 Bowsher, Loura Pardon ([id number]) -------------------------------------------------------------------------------- Clinic Level of Care Assessment Details Patient Name: Kari Medina Date of Service: 05/29/2022 2:15 PM Medical Record Number: [id number] Patient Account Number: [id number] Date of Birth/Sex: Jul 02, 1946 (75 y.o. F) Treating RN: Levora Dredge Primary Care Teliah Buffalo: [name] Other Clinician: Massie Kluver Referring Analeise Mccleery: [name] Treating Emilygrace Grothe/Extender: Skipper Cliche in Treatment: 9 Clinic Level of Care Assessment Items TOOL 4 Quantity Score '[]'$  - Use when only an EandM is performed on FOLLOW-UP visit 0 ASSESSMENTS - Nursing Assessment / Reassessment X - Reassessment of Co-morbidities (includes updates in patient status) 1 10 X- 1 5 Reassessment of Adherence to Treatment Plan ASSESSMENTS - Wound and Skin Assessment / Reassessment '[]'$  -  Simple Wound Assessment / Reassessment - one wound 0 X- 3 5 Complex Wound Assessment / Reassessment - multiple wounds '[]'$  - 0 Dermatologic / Skin Assessment (not related to wound area) ASSESSMENTS - Focused Assessment '[]'$  - Circumferential Edema Measurements - multi extremities 0 '[]'$  - 0 Nutritional Assessment / Counseling / Intervention '[]'$  - 0 Lower Extremity Assessment (monofilament, tuning fork, pulses) '[]'$  - 0 Peripheral Arterial Disease Assessment (using hand held doppler) ASSESSMENTS - Ostomy and/or Continence Assessment and Care '[]'$  - Incontinence Assessment and Management 0 '[]'$  - 0 Ostomy Care Assessment and Management (repouching, etc.) PROCESS - Coordination of Care X - Simple Patient / Family Education for ongoing care 1 15 '[]'$  - 0 Complex (extensive) Patient / Family Education for ongoing care X- 1 10 Staff obtains Consents, Records, Test Results / Process Orders '[]'$  - 0 Staff telephones HHA, Nursing Homes / Clarify orders / etc '[]'$  - 0 Routine Transfer to another Facility (non-emergent condition) '[]'$  - 0 Routine Hospital Admission (non-emergent condition) '[]'$  - 0 New Admissions / Biomedical engineer / Ordering NPWT, Apligraf, etc. '[]'$  - 0 Emergency Hospital Admission (emergent condition) X- 1 10 Simple Discharge Coordination '[]'$  - 0 Complex (extensive) Discharge Coordination PROCESS - Special Needs '[]'$  - Pediatric / Minor Patient Management 0 '[]'$  - 0 Isolation Patient Management '[]'$  - 0 Hearing / Language / Visual special needs '[]'$  - 0 Assessment of Community assistance (transportation, D/C planning, etc.) '[]'$  - 0 Additional assistance / Altered mentation '[]'$  - 0 Support Surface(s) Assessment (bed, cushion, seat, etc.) INTERVENTIONS - Wound Cleansing / Measurement Sessler, Lalita E. ([id number]) '[]'$  - 0 Simple Wound Cleansing - one wound X- 3 5 Complex Wound Cleansing - multiple wounds X- 1 5 Wound Imaging (photographs - any number of wounds) '[]'$  - 0 Wound Tracing  (instead of photographs) '[]'$  - 0 Simple Wound Measurement - one wound X- 3 5 Complex Wound Measurement - multiple wounds  INTERVENTIONS - Wound Dressings '[]'$  - Small Wound Dressing one or multiple wounds 0 X- 1 15 Medium Wound Dressing one or multiple wounds '[]'$  - 0 Large Wound Dressing one or multiple wounds X- 1 5 Application of Medications - topical '[]'$  - 0 Application of Medications - injection INTERVENTIONS - Miscellaneous '[]'$  - External ear exam 0 '[]'$  - 0 Specimen Collection (cultures, biopsies, blood, body fluids, etc.) '[]'$  - 0 Specimen(s) / Culture(s) sent or taken to Lab for analysis '[]'$  - 0 Patient Transfer (multiple staff / Civil Service fast streamer / Similar devices) '[]'$  - 0 Simple Staple / Suture removal (25 or less) '[]'$  - 0 Complex Staple / Suture removal (26 or more) '[]'$  - 0 Hypo / Hyperglycemic Management (close monitor of Blood Glucose) '[]'$  - 0 Ankle / Brachial Index (ABI) - do not check if billed separately X- 1 5 Vital Signs Has the patient been seen at the hospital within the last three years: Yes Total Score: 125 Level Of Care: New/Established - Level 4 Electronic Signature(s) Signed: 05/30/2022 4:23:30 PM By: Massie Kluver Entered By: Massie Kluver on 05/29/2022 16:23:47 Flinders, Loura Pardon ([id number]) -------------------------------------------------------------------------------- Encounter Discharge Information Details Patient Name: Kari Medina Date of Service: 05/29/2022 2:15 PM Medical Record Number: [id number] Patient Account Number: [id number] Date of Birth/Sex: 04/27/1946 (75 y.o. F) Treating RN: Levora Dredge Primary Care Munachimso Rigdon: [name] Other Clinician: Massie Kluver Referring Sueko Dimichele: [name] Treating Sonnie Pawloski/Extender: Skipper Cliche in Treatment: 9 Encounter Discharge Information Items Discharge Condition: Stable Ambulatory Status: Wheelchair Discharge Destination: Home Transportation: Private Auto Schedule Follow-up Appointment:  Yes Clinical Summary of Care: Electronic Signature(s) Signed: 05/30/2022 4:23:30 PM By: Massie Kluver Entered By: Massie Kluver on 05/29/2022 16:11:56 Polus, Loura Pardon ([id number]) -------------------------------------------------------------------------------- Lower Extremity Assessment Details Patient Name: Kari Medina Date of Service: 05/29/2022 2:15 PM Medical Record Number: [id number] Patient Account Number: [id number] Date of Birth/Sex: [birth year] (75 y.o. F) Treating RN: Levora Dredge Primary Care Athalie Newhard: [name] Other Clinician: Massie Kluver Referring Laportia Carley: [name] Treating Nareg Breighner/Extender: Skipper Cliche in Treatment: 9 Edema Assessment Assessed: Shirlyn Goltz: Yes] Patrice Paradise: Yes] Edema: [Left: Yes] [Right: Yes] Calf Left: Right: Point of Measurement: 32 cm From Medial Instep 38.5 cm 37.8 cm Ankle Left: Right: Point of Measurement: 10 cm From Medial Instep 26.5 cm 26.5 cm Vascular Assessment Pulses: Dorsalis Pedis Palpable: [Left:Yes] [Right:Yes] Electronic Signature(s) Signed: 05/30/2022 4:15:36 PM By: Levora Dredge Signed: 05/30/2022 4:23:30 PM By: Massie Kluver Entered By: Massie Kluver on 05/29/2022 15:20:34 Palen, Jasilyn Johnette Abraham ([id number]) -------------------------------------------------------------------------------- Multi Wound Chart Details Patient Name: Kari Medina Date of Service: 05/29/2022 2:15 PM Medical Record Number: [id number] Patient Account Number: [id number] Date of Birth/Sex: 07-[name]-1946 (75 y.o. 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 of the Lower N/A Extremity Extremity Comorbid History: Asthma, Chronic Obstructive Asthma, Chronic Obstructive N/A Pulmonary Disease (COPD), Sleep Pulmonary Disease (COPD), Sleep Apnea, Hypertension, Type II Apnea, Hypertension, Type II Diabetes, Raynaudos, Diabetes, Raynaudos, Osteoarthritis, Neuropathy Osteoarthritis, Neuropathy Date Acquired: 12/05/2021 05/01/2022 N/A Weeks of Treatment: 9 4 N/A Wound Status: Open Open N/A Wound Recurrence: No No N/A Clustered Wound: No Yes N/A Clustered Quantity: N/A 3 N/A  Measurements L x W x D (cm) 3.9x1.9x0.1 2.1x2x0.4 N/A Area (cm) : 5.82 3.299 N/A Volume (cm) : 0.582 1.319 N/A % Reduction in Area: 96.00% -90.90% N/A % Reduction in Volume: 96.00% -662.40% N/A Classification: Grade 2 Grade 1 N/A Exudate Amount: Medium Medium N/A Exudate Type: Serosanguineous Serosanguineous N/A Exudate Color: red, brown red, brown N/A Granulation Amount: Small (1-33%) Small (1-33%) N/A Granulation Quality: Red, Pink Red N/A Necrotic Amount: Large (67-100%) Medium (34-66%) N/A Exposed Structures: Fat Layer (Subcutaneous Tissue): Fat Layer (Subcutaneous Tissue): N/A Yes Yes Epithelialization: None None N/A Glacken, Bailynn E. ([id number]) Treatment Notes Electronic Signature(s) Signed: 05/30/2022 4:23:30 PM By: Massie Kluver Entered By: Massie Kluver on 05/29/2022 15:29:45 Geibel, Loura Pardon ([id number]) -------------------------------------------------------------------------------- Multi-Disciplinary Care Plan Details Patient Name: Kari Medina Date of Service: 05/29/2022 2:15 PM Medical Record Number: [id number] Patient Account Number: [id number] Date of Birth/Sex: 1945-12-27 (75 y.o. F) Treating RN: Levora Dredge Primary Care Meredyth Hornung: [name] Other Clinician: Massie Kluver Referring Holt Woolbright:  [name] Treating Elizibeth Breau/Extender: Skipper Cliche in Treatment: 9 Active Inactive Wound/Skin Impairment Nursing Diagnoses: Impaired tissue integrity Knowledge deficit related to ulceration/compromised skin integrity Goals: Ulcer/skin breakdown will have a volume reduction of 30% by week 4 Date Initiated: 03/27/2022 Target Resolution Date: 04/24/2022 Goal Status: Active Ulcer/skin breakdown will have a volume reduction of 50% by week 8 Date Initiated: 03/27/2022 Target Resolution Date: 05/22/2022 Goal Status: Active Ulcer/skin breakdown will have a volume reduction of 80% by week 12 Date Initiated: 03/27/2022 Target Resolution Date: 06/19/2022 Goal Status: Active Ulcer/skin breakdown will heal within 14 weeks Date Initiated: 03/27/2022 Target Resolution Date: 07/03/2022 Goal Status: Active Interventions: Assess patient/caregiver ability to obtain necessary supplies Assess patient/caregiver ability to perform ulcer/skin care regimen upon admission and as needed Assess ulceration(s) every visit Provide education on ulcer and skin care Notes: Electronic Signature(s) Signed: 05/30/2022 4:15:36 PM By: Levora Dredge Signed: 05/30/2022 4:23:30 PM By: Massie Kluver Entered By: Massie Kluver on 05/29/2022 15:20:40 Axe, Loura Pardon ([id number]) -------------------------------------------------------------------------------- Pain Assessment Details Patient Name: Kari Medina Date of Service: 05/29/2022 2:15 PM Medical Record Number: [id number] Patient Account Number: [id number] Date of Birth/Sex: 16-Oct-1946 (75 y.o. F) Treating RN: Levora Dredge Primary Care Ralphine Hinks: [name] Other Clinician: Massie Kluver Referring Mazen Marcin: [name] Treating Katherleen Folkes/Extender: Skipper Cliche in Treatment: 9 Active Problems Location of Pain Severity and Description of Pain Patient Has Paino Yes Site Locations Pain Location: Pain in Ulcers Duration of the Pain. Constant /  Intermittento Intermittent Character of Pain Describe the Pain: Burning Pain Management and Medication Current Pain Management: Rest: Yes Cold Application: Yes How does your wound impact your activities of daily livingo Sleep: Yes Electronic Signature(s) Signed: 05/30/2022 4:15:36 PM By: Levora Dredge Signed: 05/30/2022 4:23:30 PM By: Massie Kluver Entered By: Massie Kluver on 05/29/2022 14:55:34 [place]leton, Loura Pardon ([id number]) -------------------------------------------------------------------------------- Patient/Caregiver Education Details Patient Name: Kari Medina Date of Service: 05/29/2022 2:15 PM Medical Record Number: [id number] Patient Account Number: [id number] Date of Birth/Gender: [birth year] (75 y.o. F) Treating RN: Levora Dredge Primary Care Physician: [name] Other Clinician: Massie Kluver Referring Physician: [name] Treating Physician/Extender: Skipper Cliche in Treatment: 9 Education Assessment Education Provided To: Patient Education Topics Provided Wound/Skin Impairment: Handouts: Other: continue wound care as directed Methods: Explain/Verbal Responses: State content correctly Electronic Signature(s) Signed: 05/30/2022 4:23:30 PM By: Massie Kluver Entered By: Massie Kluver on 05/29/2022 16:10:43 Florence, Loura Pardon ([id number]) -------------------------------------------------------------------------------- Wound Assessment Details Patient Name: Kari Medina Date of Service: 05/29/2022 2:15 PM Medical Record Number: [id number]  Patient Account Number: [id number] Date of Birth/Sex: 27-Oct-1946 (76 y.o. F) Treating RN: Levora Dredge Primary Care Gabryelle Whitmoyer: [name] Other Clinician: Massie Kluver Referring Cory Rama: [name] Treating Cowen Pesqueira/Extender: Skipper Cliche in Treatment: 9 Wound Status Wound Number: 1 Primary Etiology: Diabetic Wound/Ulcer of the Lower Extremity Wound Location: Right, Distal Lower Leg Wound  Status: Healed - Epithelialized Wounding Event: Gradually Appeared Date Acquired: 12/05/2021 Weeks Of Treatment: 9 Clustered Wound: No Wound Description Classification: Grade 2 Exudate Amount: Medium Exudate Type: Serosanguineous Exudate Color: red, brown Treatment Notes Wound #1 (Lower Leg) Wound Laterality: Right, Distal Cleanser Peri-Wound Care Topical Primary Dressing Secondary Dressing Secured With Compression Wrap Compression Stockings Add-Ons Electronic Signature(s) Signed: 05/30/2022 4:15:36 PM By: Levora Dredge Signed: 05/30/2022 4:23:30 PM By: Massie Kluver Entered By: Massie Kluver on 05/29/2022 15:31:50 Preyer, Loura Pardon ([id number]) -------------------------------------------------------------------------------- Wound Assessment Details Patient Name: Kari Medina Date of Service: 05/29/2022 2:15 PM Medical Record Number: [id number] Patient Account Number: [id number] Date of Birth/Sex: 1946-11-28 (75 y.o. F) Treating RN: Levora Dredge Primary Care Demonta Wombles: [name] Other Clinician: Massie Kluver Referring Tangee Marszalek: [name] Treating Ludmila Ebarb/Extender: Skipper Cliche in Treatment: 9 Wound Status Wound Number: 3 Primary Diabetic Wound/Ulcer of the Lower Extremity Etiology: Wound Location: Left, Circumferential Lower Leg Wound Open Wounding Event: Gradually Appeared Status: Date Acquired: 12/05/2021 Comorbid Asthma, Chronic Obstructive Pulmonary Disease (COPD), Weeks Of Treatment: 9 History: Sleep Apnea, Hypertension, Type II Diabetes, Raynaudos, Clustered Wound: No Osteoarthritis, Neuropathy Photos Wound Measurements Length: (cm) 3.9 Width: (cm) 1.9 Depth: (cm) 0.1 Area: (cm) 5.82 Volume: (cm) 0.582 % Reduction in Area: 96% % Reduction in Volume: 96% Epithelialization: None Wound Description Classification: Grade 2 Exudate Amount: Medium Exudate Type: Serosanguineous Exudate Color: red, brown Foul Odor After Cleansing:  No Slough/Fibrino Yes Wound Bed Granulation Amount: Small (1-33%) Exposed Structure Granulation Quality: Red, Pink Fat Layer (Subcutaneous Tissue) Exposed: Yes Necrotic Amount: Large (67-100%) Necrotic Quality: Adherent Slough Treatment Notes Wound #3 (Lower Leg) Wound Laterality: Left, Circumferential Cleanser Normal Saline Discharge Instruction: Wash your hands with soap and water. Remove old dressing, discard into plastic bag and place into trash. Cleanse the wound with Normal Saline prior to applying a clean dressing using gauze sponges, not tissues or cotton balls. Do not scrub or use excessive force. Pat dry using gauze sponges, not tissue or cotton balls. Soap and Water Discharge Instruction: Gently cleanse wound with antibacterial soap, rinse and pat dry prior to dressing wounds Throne, Alawna E. ([id number]) Peri-Wound Care Topical Primary Dressing Iodosorb 40 (g) Discharge Instruction: Applied only to two wounds to the midline leg and back of leg. Secondary Dressing ABD Pad 5x9 (in/in) Discharge Instruction: Cover with ABD pad Secured With Tubigrip Size D, 3x10 (in/yd) Discharge Instruction: Apply 3 Tubigrip D 3-finger-widths below knee to base of toes to secure dressing and/or for swelling. Compression Wrap Compression Stockings Add-Ons Electronic Signature(s) Signed: 05/30/2022 4:15:36 PM By: Levora Dredge Signed: 05/30/2022 4:23:30 PM By: Massie Kluver Entered By: Massie Kluver on 05/29/2022 15:18:18 Manzi, JALEI SHIBLEY ([id number]) -------------------------------------------------------------------------------- Wound Assessment Details Patient Name: Kari Medina Date of Service: 05/29/2022 2:15 PM Medical Record Number: [id number] Patient Account Number: [id number] Date of Birth/Sex: [birth year] (75 y.o. F) Treating RN: Levora Dredge Primary Care Mackay Hanauer: [name] Other Clinician: Massie Kluver Referring Kay Ricciuti: [name] Treating  Eulene Pekar/Extender: Skipper Cliche in Treatment: 9 Wound Status Wound Number: 4 Primary Diabetic Wound/Ulcer of the Lower Extremity Etiology: Wound Location: Right Lower Leg Wound Open Wounding Event: Gradually Appeared Status: Date Acquired: 05/01/2022 Comorbid  Asthma, Chronic Obstructive Pulmonary Disease (COPD), Weeks Of Treatment: 4 History: Sleep Apnea, Hypertension, Type II Diabetes, Raynaudos, Clustered Wound: Yes Osteoarthritis, Neuropathy Photos Wound Measurements Length: (cm) 2.1 Width: (cm) 2 Depth: (cm) 0.4 Clustered Quantity: 3 Area: (cm) 3.299 Volume: (cm) 1.319 % Reduction in Area: -90.9% % Reduction in Volume: -662.4% Epithelialization: None Wound Description Classification: Grade 1 Exudate Amount: Medium Exudate Type: Serosanguineous Exudate Color: red, brown Foul Odor After Cleansing: No Slough/Fibrino Yes Wound Bed Granulation Amount: Small (1-33%) Exposed Structure Granulation Quality: Red Fat Layer (Subcutaneous Tissue) Exposed: Yes Necrotic Amount: Medium (34-66%) Necrotic Quality: Adherent Slough Treatment Notes Wound #4 (Lower Leg) Wound Laterality: Right Cleanser Normal Saline Discharge Instruction: Wash your hands with soap and water. Remove old dressing, discard into plastic bag and place into trash. Cleanse the wound with Normal Saline prior to applying a clean dressing using gauze sponges, not tissues or cotton balls. Do not scrub or use excessive force. Pat dry using gauze sponges, not tissue or cotton balls. Soap and Water Discharge Instruction: Gently cleanse wound with antibacterial soap, rinse and pat dry prior to dressing wounds Padgett, Deniece E. ([id number]) Peri-Wound Care Topical Primary Dressing Iodosorb 40 (g) Discharge Instruction: Applied only to two wounds to the midline leg and back of leg. Secondary Dressing ABD Pad 5x9 (in/in) Discharge Instruction: Cover with ABD pad Secured With Tubigrip Size D, 3x10  (in/yd) Discharge Instruction: Apply 3 Tubigrip D 3-finger-widths below knee to base of toes to secure dressing and/or for swelling. Compression Wrap Compression Stockings Add-Ons Electronic Signature(s) Signed: 05/30/2022 4:15:36 PM By: Levora Dredge Signed: 05/30/2022 4:23:30 PM By: Massie Kluver Entered By: Massie Kluver on 05/29/2022 15:19:10 Novicki, Loura Pardon ([id number]) -------------------------------------------------------------------------------- Wound Assessment Details Patient Name: Kari Medina Date of Service: 05/29/2022 2:15 PM Medical Record Number: [id number] Patient Account Number: [id number] Date of Birth/Sex: 06-23-1946 (75 y.o. F) Treating RN: Levora Dredge Primary Care Dhiya Smits: [name] Other Clinician: Massie Kluver Referring Rykin Route: [name] Treating Evy Lutterman/Extender: Skipper Cliche in Treatment: 9 Wound Status Wound Number: 5 Primary Diabetic Wound/Ulcer of the Lower Extremity Etiology: Wound Location: Left, Posterior Lower Leg Wound Open Wounding Event: Shear/Friction Status: Date Acquired: 01/23/2022 Comorbid Asthma, Chronic Obstructive Pulmonary Disease (COPD), Weeks Of Treatment: 0 History: Sleep Apnea, Hypertension, Type II Diabetes, Raynaudos, Clustered Wound: No Osteoarthritis, Neuropathy Wound Measurements Length: (cm) 1.4 Width: (cm) 0.7 Depth: (cm) 0.1 Area: (cm) 0.77 Volume: (cm) 0.077 % Reduction in Area: % Reduction in Volume: Epithelialization: None Tunneling: No Undermining: No Wound Description Classification: Grade 2 Exudate Amount: Medium Exudate Type: Serosanguineous Exudate Color: red, brown Foul Odor After Cleansing: No Slough/Fibrino Yes Wound Bed Granulation Amount: Small (1-33%) Exposed Structure Granulation Quality: Red Fascia Exposed: No Necrotic Amount: Small (1-33%) Fat Layer (Subcutaneous Tissue) Exposed: Yes Necrotic Quality: Adherent Slough Tendon Exposed: No Muscle Exposed:  No Joint Exposed: No Bone Exposed: No Treatment Notes Wound #5 (Lower Leg) Wound Laterality: Left, Posterior Cleanser Normal Saline Discharge Instruction: Wash your hands with soap and water. Remove old dressing, discard into plastic bag and place into trash. Cleanse the wound with Normal Saline prior to applying a clean dressing using gauze sponges, not tissues or cotton balls. Do not scrub or use excessive force. Pat dry using gauze sponges, not tissue or cotton balls. Soap and Water Discharge Instruction: Gently cleanse wound with antibacterial soap, rinse and pat dry prior to dressing wounds Peri-Wound Care Topical Primary Dressing Iodosorb 40 (g) Discharge Instruction: Applied only to two wounds to the midline leg and back  of leg. Secondary Dressing ABD Pad 5x9 (in/in) Discharge Instruction: Cover with ABD pad Delano, Navesink ([id number]) Secured With Tubigrip Size D, 3x10 (in/yd) Discharge Instruction: Apply 3 Tubigrip D 3-finger-widths below knee to base of toes to secure dressing and/or for swelling. Compression Wrap Compression Stockings Add-Ons Electronic Signature(s) Signed: 05/30/2022 4:15:36 PM By: Levora Dredge Signed: 05/30/2022 4:23:30 PM By: Massie Kluver Entered By: Massie Kluver on 05/29/2022 15:41:07 Bursch, KYRIANNA BARLETTA ([id number]) -------------------------------------------------------------------------------- Vitals Details Patient Name: Kari Medina Date of Service: 05/29/2022 2:15 PM Medical Record Number: [id number] Patient Account Number: [id number] Date of Birth/Sex: March 19, 1946 (75 y.o. F) Treating RN: Levora Dredge Primary Care Yannet Rincon: [name] Other Clinician: Massie Kluver Referring Emmelina Mcloughlin: [name] Treating Tamarcus Condie/Extender: Skipper Cliche in Treatment: 9 Vital Signs Time Taken: 14:49 Temperature (F): 98.0 Pulse (bpm): 94 Respiratory Rate (breaths/min): 18 Blood Pressure (mmHg): 110/76 Reference Range: 80 - 120 mg /  dl Electronic Signature(s) Signed: 05/30/2022 4:23:30 PM By: Massie Kluver Entered By: Massie Kluver on 05/29/2022 14:54:36

## 2022-06-02 ENCOUNTER — Encounter: Payer: Self-pay | Admitting: Internal Medicine

## 2022-06-02 ENCOUNTER — Ambulatory Visit (INDEPENDENT_AMBULATORY_CARE_PROVIDER_SITE_OTHER): Payer: Medicare Other | Admitting: Internal Medicine

## 2022-06-02 DIAGNOSIS — I272 Pulmonary hypertension, unspecified: Secondary | ICD-10-CM

## 2022-06-02 DIAGNOSIS — R8290 Unspecified abnormal findings in urine: Secondary | ICD-10-CM

## 2022-06-02 DIAGNOSIS — I7300 Raynaud's syndrome without gangrene: Secondary | ICD-10-CM | POA: Diagnosis not present

## 2022-06-02 DIAGNOSIS — R7303 Prediabetes: Secondary | ICD-10-CM | POA: Diagnosis not present

## 2022-06-02 DIAGNOSIS — B372 Candidiasis of skin and nail: Secondary | ICD-10-CM | POA: Insufficient documentation

## 2022-06-02 DIAGNOSIS — J449 Chronic obstructive pulmonary disease, unspecified: Secondary | ICD-10-CM | POA: Diagnosis not present

## 2022-06-02 DIAGNOSIS — N1831 Chronic kidney disease, stage 3a: Secondary | ICD-10-CM

## 2022-06-02 MED ORDER — SULFAMETHOXAZOLE-TRIMETHOPRIM 800-160 MG PO TABS: 1.0000 | ORAL_TABLET | Freq: Two times a day (BID) | ORAL | 2 refills | Status: DC

## 2022-06-02 MED ORDER — KETOCONAZOLE 2 % EX CREA
1.0000 | TOPICAL_CREAM | Freq: Every day | CUTANEOUS | 3 refills | Status: DC
Start: 2022-06-02 — End: 2022-10-13

## 2022-06-02 MED ORDER — FLUCONAZOLE 150 MG PO TABS: 150.0000 mg | ORAL_TABLET | ORAL | 2 refills | Status: DC

## 2022-06-02 NOTE — Progress Notes (Signed)
Subjective:    Patient ID: Kari Medina, female    DOB: 09-Mar-1946, 76 y.o.   MRN: [id number]  HPI Here with husband for multiple issues and follow up  Has trouble with pain at the tip of her left 2nd finger---from DIP to tip Will turn purple Aspercreme does help some  Has rash in abdominal folds Thinks it is yeast---due to sweat? Using desitin---does help prevent burning and itching  Urine smelling strong again Did have Klebsiella UTI last month Took sulfa and everything went back to normal Somewhat cloudy---but no dysuria/hematuria She does drink a lot of water  Has checked sugars periodically 97 this morning--fairly typical for her  Still going to wound clinic for leg ulcers Mostly healed on upper areas--but still present on lower legs Using iodine based paste  Breathing continues to get worse On symbicort and rescue inhaler prn Going to [facility] for specialist   Recent GFR 47  Current Outpatient Medications on File Prior to Visit  Medication Sig Dispense Refill   acetaminophen (TYLENOL) 650 MG CR tablet Take 650 mg by mouth every 8 (eight) hours as needed for pain.     albuterol (PROVENTIL HFA) 108 (90 Base) MCG/ACT inhaler Inhale 2 puffs into the lungs every 6 (six) hours as needed for wheezing or shortness of breath. 18 g 2   ALPRAZolam (XANAX) 0.25 MG tablet Take 1 tablet (0.25 mg total) by mouth 2 (two) times daily as needed for anxiety. 30 tablet 0   aspirin 325 MG EC tablet Take 162.5 mg by mouth daily.     budesonide-formoterol (SYMBICORT) 160-4.5 MCG/ACT inhaler Inhale 2 puffs into the lungs 2 (two) times daily. 3 each 1   calcium carbonate (OS-CAL) 600 MG TABS Take 600 mg by mouth daily with breakfast.     DULoxetine (CYMBALTA) 60 MG capsule TAKE 1 CAPSULE BY MOUTH  DAILY 90 capsule 3   furosemide (LASIX) 40 MG tablet TAKE ONE TABLET BY MOUTH DAILY 30 tablet 1   glucose blood (ONETOUCH VERIO) test strip Use to check blood sugar once a day. E11.9 100 each  3   levothyroxine (SYNTHROID) 200 MCG tablet Take 1 tablet (200 mcg total) by mouth daily before breakfast. 90 tablet 0   metFORMIN (GLUCOPHAGE-XR) 500 MG 24 hr tablet TAKE 1 TABLET BY MOUTH  DAILY WITH BREAKFAST 90 tablet 3   montelukast (SINGULAIR) 10 MG tablet TAKE 1 TABLET BY MOUTH AT  BEDTIME 90 tablet 3   ONETOUCH DELICA LANCETS FINE MISC 1 each by Does not apply route daily. Use to check blood sugar once a day. E11.9 100 each 3   propranolol (INDERAL) 40 MG tablet TAKE 1 TABLET BY MOUTH  TWICE DAILY 180 tablet 3   rOPINIRole (REQUIP) 3 MG tablet Take 1 tablet (3 mg total) by mouth at bedtime. 90 tablet 3   spironolactone (ALDACTONE) 25 MG tablet TAKE ONE TABLET BY MOUTH DAILY 30 tablet 1   sulfamethoxazole-trimethoprim (BACTRIM DS) 800-160 MG tablet Take 1 tablet by mouth 2 (two) times daily. 10 tablet 0   No current facility-administered medications on file prior to visit.    Allergies  Allergen Reactions   Latex Hives    Tape and bandaids only   Nickel Dermatitis   Pramipexole Other (See Comments)    Caused hallucinations, says she can take name brand.   Prednisone     Makes her "feel crazy" - can take low dosage    Topiramate Other (See Comments)    "spaced  out"   Citalopram Anxiety   Paroxetine Hcl Anxiety   Sertraline Anxiety    Past Medical History:  Diagnosis Date   Asthma    Chronic hypoxemic respiratory failure (HCC)    uses O2 with exertion and with CPAP   COPD (chronic obstructive pulmonary disease) (HCC)    Depression    Diabetes mellitus without complication (HCC)    Dyspnea    doe   Dysrhythmia    extra beat   Fatty liver    Fibromyalgia    Generalized osteoarthritis of multiple sites    History of hiatal hernia    Hypertension    Hypothyroidism    Melanoma ([place]) 07/2018   Right leg   OSA (obstructive sleep apnea)    Pain    chronic ruq and back pain   Panic attacks    PONV (postoperative nausea and vomiting)    after thyroidectomy   Raynaud  disease    RLS (restless legs syndrome)    Spleen absent    TOLD ABSENT THEN TOLD DOES HAVE SPLEEN. PATIENT IS UNCERTAIN   Tremor, essential    Wears dentures    full upper and lower    Past Surgical History:  Procedure Laterality Date   CATARACT EXTRACTION W/PHACO Right 03/04/2021   Procedure: CATARACT EXTRACTION PHACO AND INTRAOCULAR LENS PLACEMENT ([facility]) RIGHT DIABETIC 3.98 00:33.2;  Surgeon: Eulogio Bear, MD;  Location: [facility];  Service: Ophthalmology;  Laterality: Right;  Diabetic - oral meds   CATARACT EXTRACTION W/PHACO Left 03/18/2021   Procedure: CATARACT EXTRACTION PHACO AND INTRAOCULAR LENS PLACEMENT ([place]) LEFT;  Surgeon: Eulogio Bear, MD;  Location: [facility];  Service: Ophthalmology;  Laterality: Left;  2.96 0:27.9   CHOLECYSTECTOMY     DILATATION & CURETTAGE/HYSTEROSCOPY WITH MYOSURE N/A 11/10/2018   Procedure: DILATATION & CURETTAGE/HYSTEROSCOPY WITH MYOSURE/MYOMECTOMY;  Surgeon: Aletha Halim, MD;  Location: [facility];  Service: Gynecology;  Laterality: N/A;  possible myosure.  Please use myosure scope, do not open myosure blades but have in the room   JOINT REPLACEMENT Bilateral    2008/2011   MELANOMA EXCISION  07/2018   REVERSE SHOULDER ARTHROPLASTY Right 05/28/2017   Procedure: REVERSE SHOULDER ARTHROPLASTY;  Surgeon: Corky Mull, MD;  Location: [facility];  Service: Orthopedics;  Laterality: Right;   RHINOPLASTY  1972   RIGHT HEART CATH N/A 12/27/2021   Procedure: RIGHT HEART CATH;  Surgeon: Andrez Grime, MD;  Location: [facility];  Service: Cardiovascular;  Laterality: N/A;   THYROIDECTOMY  2006   TOTAL KNEE ARTHROPLASTY     bilateral    Family History  Problem Relation Age of Onset   Osteoarthritis Mother    Diabetes Mother    Cirrhosis Mother    Cancer Father    Kidney cancer Father    Bladder Cancer Father    Heart disease Brother        stents in 1 brother   Breast cancer Neg Hx      Social History   Socioeconomic History   Marital status: Married    Spouse name: Not on file   Number of children: 1   Years of education: Not on file   Highest education level: Not on file  Occupational History   Occupation: Neurosurgeon    Comment: Retired  Tobacco Use   Smoking status: Former    Packs/day: 0.25    Years: 5.00    Total pack years: 1.25  Types: Cigarettes    Quit date: 12/22/1988    Years since quitting: 33.4   Smokeless tobacco: Never  Vaping Use   Vaping Use: Never used  Substance and Sexual Activity   Alcohol use: No   Drug use: No   Sexual activity: Not on file  Other Topics Concern   Not on file  Social History Narrative   1 daughter      Has living will   Husband has health care POA. Alternate would be daughter Judson Roch   Would allow resuscitation but no prolonged machines   Not sure about feeding tubes   Social Determinants of Health   Financial Resource Strain: Not on file  Food Insecurity: Not on file  Transportation Needs: Not on file  Physical Activity: Not on file  Stress: Not on file  Social Connections: Not on file  Intimate Partner Violence: Not on file   Review of Systems Sleeps with CPAP---fairly high pressures (18) She winds up pulling it off--not sure "if it is anxiety----like I'm suffocating" Still not sleeping well Appetite is decreasing--some recovery now Has lost some weight Discussed preventative care---she is not excited about cancer screening but will consider mammogram. Now off xanax---caused some hallucinations    Objective:   Physical Exam Cardiovascular:     Rate and Rhythm: Normal rate and regular rhythm.     Heart sounds: No murmur heard.    No gallop.  Pulmonary:     Effort: Pulmonary effort is normal.     Breath sounds: Normal breath sounds. No wheezing or rales.     Comments: On oxygen Musculoskeletal:     Cervical back: Neck supple.     Comments: Fingers are cool No infection in left  2nd finger---slight purple discoloration Palpable radial pulse  Mild edema--calves are wrapped  Lymphadenopathy:     Cervical: No cervical adenopathy.  Skin:    Comments: Fungal intertrigo under abdominal folds  Psychiatric:     Comments: Frustrated but not depressed            Assessment & Plan:

## 2022-06-02 NOTE — Assessment & Plan Note (Signed)
Ongoing DOE despite the symbicort and prn albuterol

## 2022-06-02 NOTE — Assessment & Plan Note (Signed)
Recurrent apparent colonization despite adequate oral fluid intake Will refill septra DS for 1-3 day Rx if bad

## 2022-06-02 NOTE — Assessment & Plan Note (Signed)
Stable function

## 2022-06-02 NOTE — Assessment & Plan Note (Signed)
Seems to be doing well with the metformin 500 daily

## 2022-06-02 NOTE — Assessment & Plan Note (Signed)
Discussed keeping finger warm Not clear calcium channel blockers are a good idea

## 2022-06-02 NOTE — Assessment & Plan Note (Signed)
Has follow up with specialist at [facility] No specific Rx now

## 2022-06-02 NOTE — Assessment & Plan Note (Signed)
Will give ketoconazole cream and weekly prn fluconazole '150mg'$ 

## 2022-06-07 DIAGNOSIS — I890 Lymphedema, not elsewhere classified: Secondary | ICD-10-CM | POA: Diagnosis not present

## 2022-06-07 DIAGNOSIS — L97812 Non-pressure chronic ulcer of other part of right lower leg with fat layer exposed: Secondary | ICD-10-CM | POA: Diagnosis not present

## 2022-06-07 DIAGNOSIS — L97822 Non-pressure chronic ulcer of other part of left lower leg with fat layer exposed: Secondary | ICD-10-CM | POA: Diagnosis not present

## 2022-06-07 DIAGNOSIS — E11622 Type 2 diabetes mellitus with other skin ulcer: Secondary | ICD-10-CM | POA: Diagnosis not present

## 2022-06-07 DIAGNOSIS — E1151 Type 2 diabetes mellitus with diabetic peripheral angiopathy without gangrene: Secondary | ICD-10-CM | POA: Diagnosis not present

## 2022-06-07 DIAGNOSIS — E1140 Type 2 diabetes mellitus with diabetic neuropathy, unspecified: Secondary | ICD-10-CM | POA: Diagnosis not present

## 2022-06-09 ENCOUNTER — Telehealth: Payer: Self-pay | Admitting: Internal Medicine

## 2022-06-09 DIAGNOSIS — Z7951 Long term (current) use of inhaled steroids: Secondary | ICD-10-CM | POA: Diagnosis not present

## 2022-06-09 DIAGNOSIS — E1140 Type 2 diabetes mellitus with diabetic neuropathy, unspecified: Secondary | ICD-10-CM | POA: Diagnosis not present

## 2022-06-09 DIAGNOSIS — G4730 Sleep apnea, unspecified: Secondary | ICD-10-CM | POA: Diagnosis not present

## 2022-06-09 DIAGNOSIS — E1151 Type 2 diabetes mellitus with diabetic peripheral angiopathy without gangrene: Secondary | ICD-10-CM | POA: Diagnosis not present

## 2022-06-09 DIAGNOSIS — L97812 Non-pressure chronic ulcer of other part of right lower leg with fat layer exposed: Secondary | ICD-10-CM | POA: Diagnosis not present

## 2022-06-09 DIAGNOSIS — Z87440 Personal history of urinary (tract) infections: Secondary | ICD-10-CM | POA: Diagnosis not present

## 2022-06-09 DIAGNOSIS — I10 Essential (primary) hypertension: Secondary | ICD-10-CM | POA: Diagnosis not present

## 2022-06-09 DIAGNOSIS — M1990 Unspecified osteoarthritis, unspecified site: Secondary | ICD-10-CM | POA: Diagnosis not present

## 2022-06-09 DIAGNOSIS — J449 Chronic obstructive pulmonary disease, unspecified: Secondary | ICD-10-CM | POA: Diagnosis not present

## 2022-06-09 DIAGNOSIS — L97822 Non-pressure chronic ulcer of other part of left lower leg with fat layer exposed: Secondary | ICD-10-CM | POA: Diagnosis not present

## 2022-06-09 DIAGNOSIS — Z7984 Long term (current) use of oral hypoglycemic drugs: Secondary | ICD-10-CM | POA: Diagnosis not present

## 2022-06-09 DIAGNOSIS — I890 Lymphedema, not elsewhere classified: Secondary | ICD-10-CM | POA: Diagnosis not present

## 2022-06-09 DIAGNOSIS — E11622 Type 2 diabetes mellitus with other skin ulcer: Secondary | ICD-10-CM | POA: Diagnosis not present

## 2022-06-09 DIAGNOSIS — I2729 Other secondary pulmonary hypertension: Secondary | ICD-10-CM | POA: Diagnosis not present

## 2022-06-09 DIAGNOSIS — Z87891 Personal history of nicotine dependence: Secondary | ICD-10-CM | POA: Diagnosis not present

## 2022-06-09 DIAGNOSIS — E039 Hypothyroidism, unspecified: Secondary | ICD-10-CM | POA: Diagnosis not present

## 2022-06-09 DIAGNOSIS — Z9981 Dependence on supplemental oxygen: Secondary | ICD-10-CM | POA: Diagnosis not present

## 2022-06-09 DIAGNOSIS — M797 Fibromyalgia: Secondary | ICD-10-CM | POA: Diagnosis not present

## 2022-06-09 NOTE — Telephone Encounter (Signed)
Patient called back tried to make appointment for evaluation. She declined to make appointment. Advised that we can not treat without seeing her.  She has never been treated for this in the past. She states she has appointment with pulmonology tomorrow.

## 2022-06-09 NOTE — Telephone Encounter (Signed)
Pt called stating she has gout in both of her feet, she also stated she has had this before, wants to see if Dr. Silvio Pate can give her a medication for it. "They are a little swollen and red." She last saw Dr. Silvio Pate on 6.12.2023. The call dropped and got disconnected so this is all the information I got, please advise and return a call when possible, thanks.   Callback Number: [id number]

## 2022-06-09 NOTE — Telephone Encounter (Signed)
Spoke to pt. She said she is too tired to come in. Does not want to come in today since she has to go to pulmonology tomorrow. States the bottom of her foot burns and is red. Looks swollen. She was asking if I thought it was Gout, neuropathy, or something else. I explained that we cannot treat her symptoms without seeing her. Gout treatment will be completely different from neuropathy. She said if it does not improve by the end of the week she will try to come in to be seen.

## 2022-06-12 ENCOUNTER — Telehealth: Payer: Self-pay

## 2022-06-12 NOTE — Telephone Encounter (Signed)
Left message asking pt if she wanted to use [facility] for spironolactone and furosemide. We received a fax from them for these meds.

## 2022-06-19 DIAGNOSIS — I2722 Pulmonary hypertension due to left heart disease: Secondary | ICD-10-CM | POA: Diagnosis not present

## 2022-06-19 DIAGNOSIS — G4733 Obstructive sleep apnea (adult) (pediatric): Secondary | ICD-10-CM | POA: Diagnosis not present

## 2022-06-19 DIAGNOSIS — J4540 Moderate persistent asthma, uncomplicated: Secondary | ICD-10-CM | POA: Diagnosis not present

## 2022-06-19 DIAGNOSIS — R0602 Shortness of breath: Secondary | ICD-10-CM | POA: Diagnosis not present

## 2022-06-25 DIAGNOSIS — I272 Pulmonary hypertension, unspecified: Secondary | ICD-10-CM | POA: Diagnosis not present

## 2022-06-25 DIAGNOSIS — R0602 Shortness of breath: Secondary | ICD-10-CM | POA: Diagnosis not present

## 2022-06-27 ENCOUNTER — Emergency Department: Payer: Medicare Other

## 2022-06-27 ENCOUNTER — Other Ambulatory Visit: Payer: Self-pay

## 2022-06-27 ENCOUNTER — Encounter: Payer: Medicare Other | Attending: Physician Assistant | Admitting: Physician Assistant

## 2022-06-27 ENCOUNTER — Inpatient Hospital Stay
Admission: EM | Admit: 2022-06-27 | Discharge: 2022-06-29 | DRG: 291 | Disposition: A | Discharge status: Home / Self Care | Payer: Medicare Other | Source: Ambulatory Visit | Attending: Internal Medicine | Admitting: Internal Medicine

## 2022-06-27 DIAGNOSIS — Z8051 Family history of malignant neoplasm of kidney: Secondary | ICD-10-CM

## 2022-06-27 DIAGNOSIS — Z8249 Family history of ischemic heart disease and other diseases of the circulatory system: Secondary | ICD-10-CM

## 2022-06-27 DIAGNOSIS — G250 Essential tremor: Secondary | ICD-10-CM | POA: Diagnosis not present

## 2022-06-27 DIAGNOSIS — L97822 Non-pressure chronic ulcer of other part of left lower leg with fat layer exposed: Secondary | ICD-10-CM | POA: Diagnosis not present

## 2022-06-27 DIAGNOSIS — E119 Type 2 diabetes mellitus without complications: Secondary | ICD-10-CM

## 2022-06-27 DIAGNOSIS — N179 Acute kidney failure, unspecified: Secondary | ICD-10-CM | POA: Diagnosis present

## 2022-06-27 DIAGNOSIS — L97929 Non-pressure chronic ulcer of unspecified part of left lower leg with unspecified severity: Secondary | ICD-10-CM | POA: Diagnosis present

## 2022-06-27 DIAGNOSIS — I83009 Varicose veins of unspecified lower extremity with ulcer of unspecified site: Secondary | ICD-10-CM | POA: Diagnosis present

## 2022-06-27 DIAGNOSIS — I5033 Acute on chronic diastolic (congestive) heart failure: Secondary | ICD-10-CM | POA: Diagnosis present

## 2022-06-27 DIAGNOSIS — Z79890 Hormone replacement therapy (postmenopausal): Secondary | ICD-10-CM

## 2022-06-27 DIAGNOSIS — I272 Pulmonary hypertension, unspecified: Secondary | ICD-10-CM | POA: Diagnosis present

## 2022-06-27 DIAGNOSIS — K760 Fatty (change of) liver, not elsewhere classified: Secondary | ICD-10-CM | POA: Diagnosis present

## 2022-06-27 DIAGNOSIS — Z85820 Personal history of malignant melanoma of skin: Secondary | ICD-10-CM

## 2022-06-27 DIAGNOSIS — I10 Essential (primary) hypertension: Secondary | ICD-10-CM | POA: Diagnosis not present

## 2022-06-27 DIAGNOSIS — E1140 Type 2 diabetes mellitus with diabetic neuropathy, unspecified: Secondary | ICD-10-CM | POA: Diagnosis present

## 2022-06-27 DIAGNOSIS — Z6841 Body Mass Index (BMI) 40.0 and over, adult: Secondary | ICD-10-CM

## 2022-06-27 DIAGNOSIS — G2581 Restless legs syndrome: Secondary | ICD-10-CM | POA: Diagnosis present

## 2022-06-27 DIAGNOSIS — Z91048 Other nonmedicinal substance allergy status: Secondary | ICD-10-CM

## 2022-06-27 DIAGNOSIS — Z8052 Family history of malignant neoplasm of bladder: Secondary | ICD-10-CM

## 2022-06-27 DIAGNOSIS — I5031 Acute diastolic (congestive) heart failure: Secondary | ICD-10-CM | POA: Diagnosis not present

## 2022-06-27 DIAGNOSIS — I110 Hypertensive heart disease with heart failure: Principal | ICD-10-CM | POA: Diagnosis present

## 2022-06-27 DIAGNOSIS — Z7982 Long term (current) use of aspirin: Secondary | ICD-10-CM

## 2022-06-27 DIAGNOSIS — Z20822 Contact with and (suspected) exposure to covid-19: Secondary | ICD-10-CM | POA: Diagnosis present

## 2022-06-27 DIAGNOSIS — Z7951 Long term (current) use of inhaled steroids: Secondary | ICD-10-CM

## 2022-06-27 DIAGNOSIS — E11622 Type 2 diabetes mellitus with other skin ulcer: Secondary | ICD-10-CM | POA: Diagnosis not present

## 2022-06-27 DIAGNOSIS — Z79899 Other long term (current) drug therapy: Secondary | ICD-10-CM

## 2022-06-27 DIAGNOSIS — I5082 Biventricular heart failure: Secondary | ICD-10-CM | POA: Diagnosis present

## 2022-06-27 DIAGNOSIS — R0902 Hypoxemia: Secondary | ICD-10-CM | POA: Diagnosis not present

## 2022-06-27 DIAGNOSIS — L97812 Non-pressure chronic ulcer of other part of right lower leg with fat layer exposed: Secondary | ICD-10-CM | POA: Diagnosis not present

## 2022-06-27 DIAGNOSIS — Z9981 Dependence on supplemental oxygen: Secondary | ICD-10-CM

## 2022-06-27 DIAGNOSIS — E890 Postprocedural hypothyroidism: Secondary | ICD-10-CM | POA: Diagnosis present

## 2022-06-27 DIAGNOSIS — F410 Panic disorder [episodic paroxysmal anxiety] without agoraphobia: Secondary | ICD-10-CM | POA: Diagnosis present

## 2022-06-27 DIAGNOSIS — J439 Emphysema, unspecified: Secondary | ICD-10-CM | POA: Diagnosis present

## 2022-06-27 DIAGNOSIS — Z87891 Personal history of nicotine dependence: Secondary | ICD-10-CM

## 2022-06-27 DIAGNOSIS — I7300 Raynaud's syndrome without gangrene: Secondary | ICD-10-CM | POA: Diagnosis present

## 2022-06-27 DIAGNOSIS — Z91040 Latex allergy status: Secondary | ICD-10-CM

## 2022-06-27 DIAGNOSIS — M797 Fibromyalgia: Secondary | ICD-10-CM | POA: Diagnosis present

## 2022-06-27 DIAGNOSIS — L97919 Non-pressure chronic ulcer of unspecified part of right lower leg with unspecified severity: Secondary | ICD-10-CM | POA: Diagnosis present

## 2022-06-27 DIAGNOSIS — Z833 Family history of diabetes mellitus: Secondary | ICD-10-CM

## 2022-06-27 DIAGNOSIS — I7121 Aneurysm of the ascending aorta, without rupture: Secondary | ICD-10-CM | POA: Diagnosis not present

## 2022-06-27 DIAGNOSIS — E039 Hypothyroidism, unspecified: Secondary | ICD-10-CM | POA: Diagnosis present

## 2022-06-27 DIAGNOSIS — Z96611 Presence of right artificial shoulder joint: Secondary | ICD-10-CM | POA: Diagnosis present

## 2022-06-27 DIAGNOSIS — I2721 Secondary pulmonary arterial hypertension: Secondary | ICD-10-CM | POA: Diagnosis present

## 2022-06-27 DIAGNOSIS — E8770 Fluid overload, unspecified: Secondary | ICD-10-CM | POA: Diagnosis not present

## 2022-06-27 DIAGNOSIS — M159 Polyosteoarthritis, unspecified: Secondary | ICD-10-CM | POA: Diagnosis present

## 2022-06-27 DIAGNOSIS — I270 Primary pulmonary hypertension: Secondary | ICD-10-CM | POA: Diagnosis not present

## 2022-06-27 DIAGNOSIS — G4733 Obstructive sleep apnea (adult) (pediatric): Secondary | ICD-10-CM | POA: Diagnosis present

## 2022-06-27 DIAGNOSIS — J449 Chronic obstructive pulmonary disease, unspecified: Secondary | ICD-10-CM | POA: Diagnosis present

## 2022-06-27 DIAGNOSIS — L97222 Non-pressure chronic ulcer of left calf with fat layer exposed: Secondary | ICD-10-CM | POA: Diagnosis not present

## 2022-06-27 DIAGNOSIS — F32A Depression, unspecified: Secondary | ICD-10-CM | POA: Diagnosis present

## 2022-06-27 DIAGNOSIS — Z96659 Presence of unspecified artificial knee joint: Secondary | ICD-10-CM | POA: Diagnosis present

## 2022-06-27 DIAGNOSIS — R0602 Shortness of breath: Secondary | ICD-10-CM | POA: Diagnosis not present

## 2022-06-27 DIAGNOSIS — L97212 Non-pressure chronic ulcer of right calf with fat layer exposed: Secondary | ICD-10-CM | POA: Diagnosis not present

## 2022-06-27 DIAGNOSIS — I700 Atherosclerosis of aorta: Secondary | ICD-10-CM | POA: Diagnosis not present

## 2022-06-27 DIAGNOSIS — R778 Other specified abnormalities of plasma proteins: Secondary | ICD-10-CM

## 2022-06-27 DIAGNOSIS — Z888 Allergy status to other drugs, medicaments and biological substances status: Secondary | ICD-10-CM

## 2022-06-27 DIAGNOSIS — Z7984 Long term (current) use of oral hypoglycemic drugs: Secondary | ICD-10-CM

## 2022-06-27 DIAGNOSIS — J9621 Acute and chronic respiratory failure with hypoxia: Secondary | ICD-10-CM | POA: Diagnosis present

## 2022-06-27 LAB — CBC
HCT: 49.1 % — ABNORMAL HIGH (ref 36.0–46.0)
Hemoglobin: 16.3 g/dL — ABNORMAL HIGH (ref 12.0–15.0)
MCH: 32.1 pg (ref 26.0–34.0)
MCHC: 33.2 g/dL (ref 30.0–36.0)
MCV: 96.8 fL (ref 80.0–100.0)
Platelets: 145 10*3/uL — ABNORMAL LOW (ref 150–400)
RBC: 5.07 MIL/uL (ref 3.87–5.11)
RDW: 18.5 % — ABNORMAL HIGH (ref 11.5–15.5)
WBC: 7.4 10*3/uL (ref 4.0–10.5)
nRBC: 0.3 % — ABNORMAL HIGH (ref 0.0–0.2)

## 2022-06-27 LAB — BASIC METABOLIC PANEL
Anion gap: 9 (ref 5–15)
BUN: 24 mg/dL — ABNORMAL HIGH (ref 8–23)
CO2: 26 mmol/L (ref 22–32)
Calcium: 9.1 mg/dL (ref 8.9–10.3)
Chloride: 105 mmol/L (ref 98–111)
Creatinine, Ser: 1.25 mg/dL — ABNORMAL HIGH (ref 0.44–1.00)
GFR, Estimated: 46 mL/min — ABNORMAL LOW (ref >60)
Glucose, Bld: 94 mg/dL (ref 70–99)
Potassium: 4.2 mmol/L (ref 3.5–5.1)
Sodium: 140 mmol/L (ref 135–145)

## 2022-06-27 LAB — PROTIME-INR
INR: 1.2 (ref 0.8–1.2)
Prothrombin Time: 15 seconds (ref 11.4–15.2)

## 2022-06-27 LAB — BRAIN NATRIURETIC PEPTIDE: B Natriuretic Peptide: 1906 pg/mL — ABNORMAL HIGH (ref 0.0–100.0)

## 2022-06-27 LAB — TROPONIN I (HIGH SENSITIVITY)
Troponin I (High Sensitivity): 40 ng/L — ABNORMAL HIGH (ref <18)
Troponin I (High Sensitivity): 37 ng/L — ABNORMAL HIGH (ref <18)

## 2022-06-27 LAB — PROCALCITONIN: Procalcitonin: <0.1 ng/mL

## 2022-06-27 LAB — SARS CORONAVIRUS 2 BY RT PCR: SARS Coronavirus 2 by RT PCR: NEGATIVE

## 2022-06-27 LAB — GLUCOSE, CAPILLARY: Glucose-Capillary: 133 mg/dL — ABNORMAL HIGH (ref 70–99)

## 2022-06-27 MED ORDER — FUROSEMIDE 10 MG/ML IJ SOLN
40.0000 mg | Freq: Once | INTRAMUSCULAR | Status: AC
  Administered 2022-06-27: 40 mg via INTRAVENOUS
  Filled 2022-06-27: qty 4

## 2022-06-27 MED ORDER — PROPRANOLOL HCL 10 MG PO TABS
40.0000 mg | ORAL_TABLET | Freq: Two times a day (BID) | ORAL | Status: DC
  Administered 2022-06-27: 40 mg via ORAL
  Filled 2022-06-27: qty 4

## 2022-06-27 MED ORDER — INSULIN ASPART 100 UNIT/ML IJ SOLN: 0.0000 [IU] | Freq: Three times a day (TID) | INTRAMUSCULAR | Status: DC

## 2022-06-27 MED ORDER — LEVOTHYROXINE SODIUM 100 MCG PO TABS
200.0000 ug | ORAL_TABLET | Freq: Every day | ORAL | Status: DC
  Administered 2022-06-28 – 2022-06-29 (×2): 200 ug via ORAL
  Filled 2022-06-27 (×2): qty 2

## 2022-06-27 MED ORDER — ALBUTEROL SULFATE (2.5 MG/3ML) 0.083% IN NEBU
3.0000 mL | INHALATION_SOLUTION | Freq: Four times a day (QID) | RESPIRATORY_TRACT | Status: DC | PRN
Start: 2022-06-27 — End: 2022-06-30

## 2022-06-27 MED ORDER — ROPINIROLE HCL 1 MG PO TABS
3.0000 mg | ORAL_TABLET | Freq: Every day | ORAL | Status: DC
Start: 2022-06-27 — End: 2022-06-30
  Administered 2022-06-27 – 2022-06-28 (×2): 3 mg via ORAL
  Filled 2022-06-27 (×3): qty 3

## 2022-06-27 MED ORDER — IOHEXOL 350 MG/ML SOLN
75.0000 mL | Freq: Once | INTRAVENOUS | Status: AC | PRN
  Administered 2022-06-27: 75 mL via INTRAVENOUS

## 2022-06-27 MED ORDER — FUROSEMIDE 10 MG/ML IJ SOLN
40.0000 mg | Freq: Two times a day (BID) | INTRAMUSCULAR | Status: DC
  Administered 2022-06-27: 40 mg via INTRAVENOUS
  Filled 2022-06-27: qty 4

## 2022-06-27 MED ORDER — MOMETASONE FURO-FORMOTEROL FUM 200-5 MCG/ACT IN AERO
2.0000 | INHALATION_SPRAY | Freq: Two times a day (BID) | RESPIRATORY_TRACT | Status: DC
  Administered 2022-06-28 – 2022-06-29 (×3): 2 via RESPIRATORY_TRACT
  Filled 2022-06-27 (×2): qty 8.8

## 2022-06-27 MED ORDER — ACETAMINOPHEN 325 MG PO TABS
650.0000 mg | ORAL_TABLET | Freq: Four times a day (QID) | ORAL | Status: DC | PRN
  Administered 2022-06-28 – 2022-06-29 (×4): 650 mg via ORAL
  Filled 2022-06-27 (×4): qty 2

## 2022-06-27 MED ORDER — SPIRONOLACTONE 25 MG PO TABS
25.0000 mg | ORAL_TABLET | Freq: Every day | ORAL | Status: DC
  Filled 2022-06-27: qty 1

## 2022-06-27 MED ORDER — ONDANSETRON HCL 4 MG PO TABS: 4.0000 mg | ORAL_TABLET | Freq: Four times a day (QID) | ORAL | Status: DC | PRN

## 2022-06-27 MED ORDER — INSULIN ASPART 100 UNIT/ML IJ SOLN: 0.0000 [IU] | Freq: Every day | INTRAMUSCULAR | Status: DC

## 2022-06-27 MED ORDER — ACETAMINOPHEN ER 650 MG PO TBCR: 650.0000 mg | EXTENDED_RELEASE_TABLET | Freq: Three times a day (TID) | ORAL | Status: DC | PRN

## 2022-06-27 MED ORDER — ACETAMINOPHEN 650 MG RE SUPP: 650.0000 mg | Freq: Four times a day (QID) | RECTAL | Status: DC | PRN

## 2022-06-27 MED ORDER — ENOXAPARIN SODIUM 40 MG/0.4ML IJ SOSY
40.0000 mg | PREFILLED_SYRINGE | INTRAMUSCULAR | Status: DC
  Administered 2022-06-27 – 2022-06-28 (×2): 40 mg via SUBCUTANEOUS
  Filled 2022-06-27 (×2): qty 0.4

## 2022-06-27 MED ORDER — ORAL CARE MOUTH RINSE: 15.0000 mL | OROMUCOSAL | Status: DC | PRN

## 2022-06-27 MED ORDER — ONDANSETRON HCL 4 MG/2ML IJ SOLN: 4.0000 mg | Freq: Four times a day (QID) | INTRAMUSCULAR | Status: DC | PRN

## 2022-06-27 NOTE — ED Provider Triage Note (Signed)
Emergency Medicine Provider Triage Evaluation Note  Shonette Rhames, a 76 y.o. female  was evaluated in triage.  Pt complains of fluid retention and weeping wounds.  Patient presents to the wound care center, where she had her leg wounds addressed today.  They sent over for concerns of due to increased leg swelling and some increased shortness of breath.  Patient is on 3 L chronically, but is high toxic at 89 to 90% on third-base 3 L.  She is stable with sats of 95% plus on 6 L per [place].  Review of Systems  Positive: Fluid retention, shortness of breath, leg edema Negative: FCS  Physical Exam  BP 120/87   Pulse 68   Temp 98.2 F (36.8 C) (Oral)   Resp 18   SpO2 (!) 89%  Gen:   Awake, no distress NAD Resp:  Normal effort  MSK:   Moves extremities without difficulty.  Bilateral leg dressings in place. Other:    Medical Decision Making  Medically screening exam initiated at 4:59 PM.  Appropriate orders placed.  Raevin Wierenga was informed that the remainder of the evaluation will be completed by another provider, this initial triage assessment does not replace that evaluation, and the importance of remaining in the ED until their evaluation is complete.  Patient to the ED from the wound care center, for concern for fluid overload.   Melvenia Needles, PA-C 06/27/22 1700

## 2022-06-27 NOTE — ED Triage Notes (Signed)
Pt to ED via POV from home. Pt reports she went to the wound care center due to weeping in her legs and existing wounds. Pt states was unable to get oxygen up at wound clinic and concerned about fluid ion her lungs. Pt endorses increased WOB and SOB and generally feeling unwell.   Pt oxygen sats 89-90% on 3L [place]. Pt wears 3L [place] chronically. Pt on 6L [place] with oxygen sats 95%

## 2022-06-27 NOTE — Assessment & Plan Note (Signed)
History of severe pulmonary hypertension, followed by Dr. Gilles Chiquito and Dr. Raul Del On IV Lasix, propranolol and spironolactone

## 2022-06-27 NOTE — Assessment & Plan Note (Signed)
Complicating factor to overall prognosis and care 

## 2022-06-27 NOTE — Assessment & Plan Note (Signed)
Sliding scale insulin coverage 

## 2022-06-27 NOTE — Assessment & Plan Note (Signed)
Continue levothyroxine 

## 2022-06-27 NOTE — ED Provider Notes (Signed)
[facility] Provider Note    Event Date/Time   First MD Initiated Contact with Patient 06/27/22 1705     (approximate)   History   Shortness of Breath   HPI  Kari Medina is a 76 y.o. female with past medical history of asthma, COPD, remote tobacco abuse, pulmonary artery hypertension and chronic hypoxic respiratory failure on 3 L nasal cannula chronically, fibromyalgia, obesity, RLS, hypothyroidism, HTN, Raynaud's disease and chronic lower extremity edema and pressure wounds followed by wound care who presents for further evaluation after being evaluated at wound care earlier today for routine wrapping and checkup for legs after she reportedly had low oxygen levels.  She denies any new shortness of breath, cough, chest pain, vomiting or diarrhea but she does have chronic nausea.  No headache, earache, sore throat or new medications.  No recent tobacco abuse EtOH use or illicit drug use.  She states the swelling in her legs is chronic and she is no new pain.  No recent injuries or falls.  She notes she is minimally ambulatory over the last several months after strict fall she had in December.    Past Medical History:  Diagnosis Date   Asthma    Chronic hypoxemic respiratory failure (HCC)    uses O2 with exertion and with CPAP   COPD (chronic obstructive pulmonary disease) (HCC)    Depression    Diabetes mellitus without complication (HCC)    Dyspnea    doe   Dysrhythmia    extra beat   Fatty liver    Fibromyalgia    Generalized osteoarthritis of multiple sites    History of hiatal hernia    Hypertension    Hypothyroidism    Melanoma ([place]) 07/2018   Right leg   OSA (obstructive sleep apnea)    Pain    chronic ruq and back pain   Panic attacks    PONV (postoperative nausea and vomiting)    after thyroidectomy   Raynaud disease    RLS (restless legs syndrome)    Spleen absent    TOLD ABSENT THEN TOLD DOES HAVE SPLEEN. PATIENT IS UNCERTAIN    Tremor, essential    Wears dentures    full upper and lower      Physical Exam  Triage Vital Signs: ED Triage Vitals [06/27/22 1641]  Enc Vitals Group     BP 120/87     Pulse Rate 68     Resp 18     Temp 98.2 F (36.8 C)     Temp Source Oral     SpO2 (!) 89 %     Weight      Height      Head Circumference      Peak Flow      Pain Score 0     Pain Loc      Pain Edu?      Excl. in [place]?     Most recent vital signs: Vitals:   06/27/22 1907 06/27/22 1909  BP:    Pulse: 62 69  Resp:    Temp:    SpO2: (!) 86% (!) 89%    General: Awake, no distress.  CV:  Good peripheral perfusion.  2+ bilateral radial pulses.  No audible murmur. Resp:  Normal effort.  Diminished bilaterally somewhat difficult to auscultate due to habitus. Abd:  No distention.  Soft. Other:  Bilateral lower extremity edema.  Wound wrappings kept in place as she is already been examined  earlier today and was told that her legs appear normal   ED Results / Procedures / Treatments  Labs (all labs ordered are listed, but only abnormal results are displayed) Labs Reviewed  BASIC METABOLIC PANEL - Abnormal; Notable for the following components:      Result Value   BUN 24 (*)    Creatinine, Ser 1.25 (*)    GFR, Estimated 46 (*)    All other components within normal limits  CBC - Abnormal; Notable for the following components:   Hemoglobin 16.3 (*)    HCT 49.1 (*)    RDW 18.5 (*)    Platelets 145 (*)    nRBC 0.3 (*)    All other components within normal limits  BRAIN NATRIURETIC PEPTIDE - Abnormal; Notable for the following components:   B Natriuretic Peptide 1,906.0 (*)    All other components within normal limits  BLOOD GAS, VENOUS - Abnormal; Notable for the following components:   pO2, Ven 31 (*)    Bicarbonate 29.2 (*)    Acid-Base Excess 3.8 (*)    All other components within normal limits  TROPONIN I (HIGH SENSITIVITY) - Abnormal; Notable for the following components:   Troponin I (High  Sensitivity) 40 (*)    All other components within normal limits  SARS CORONAVIRUS 2 BY RT PCR  PROTIME-INR  PROCALCITONIN  TROPONIN I (HIGH SENSITIVITY)     EKG  EKG is remarkable for sinus rhythm with a ventricular rate of 63, right axis deviation, PR interval of 202, and fairly diffuse nonspecific ST changes in inferior and lateral leads as well as V3 and V4.   RADIOLOGY  Chest x-ray my interpretation shows cardiomegaly and enlargement of the pulmonary vasculature and what appears to be some early edema.  I reviewed radiology interpretation and agree to findings of possible airspace disease in the left lower lobe without other clear acute process.   CTA chest on my interpretation without evidence of a large acute PE or consolidation or pneumothorax or significant edema.  Patient's heart is enlarged and her pulmonary arteries are dilated.  I reviewed radiology's interpretation and agree with the findings of no PE and stable aortic aneurysm as well as chronic right ventricular dilation and enlargement of the pulmonary artery as well as aortic atherosclerosis emphysema.  PROCEDURES:  Critical Care performed: Yes, see critical care procedure note(s)  .Critical Care  Performed by: Lucrezia Starch, MD Authorized by: Lucrezia Starch, MD   Critical care provider statement:    Critical care time (minutes):  30   Critical care was necessary to treat or prevent imminent or life-threatening deterioration of the following conditions:  Respiratory failure   Critical care was time spent personally by me on the following activities:  Development of treatment plan with patient or surrogate, discussions with consultants, evaluation of patient's response to treatment, examination of patient, ordering and review of laboratory studies, ordering and review of radiographic studies, ordering and performing treatments and interventions, pulse oximetry, re-evaluation of patient's condition and review of  old charts    MEDICATIONS ORDERED IN ED: Medications  furosemide (LASIX) injection 40 mg (has no administration in time range)  iohexol (OMNIPAQUE) 350 MG/ML injection 75 mL (75 mLs Intravenous Contrast Given 06/27/22 1840)     IMPRESSION / MDM / [place] / ED COURSE  I reviewed the triage vital signs and the nursing notes. Patient's presentation is most consistent with acute presentation with potential threat to life or bodily function.  Differential diagnosis includes, but is not limited to, possible acute on chronic toxic respiratory failure related to volume overload, pneumonia, PE, arrhythmia, anemia, bronchitis.  No significant wheezing or increased cough distress acute COPD or asthma exacerbation.   EKG is remarkable for sinus rhythm with a ventricular rate of 63, right axis deviation, PR interval of 202, and fairly diffuse nonspecific ST changes in inferior and lateral leads as well as V3 and V4.  Chest x-ray my interpretation shows cardiomegaly and enlargement of the pulmonary vasculature and what appears to be some early edema.  I reviewed radiology interpretation and agree to findings of possible airspace disease in the left lower lobe without other clear acute process.  CTA chest on my interpretation without evidence of a large acute PE or consolidation or pneumothorax or significant edema.  Patient's heart is enlarged and her pulmonary arteries are dilated.  I reviewed radiology's interpretation and agree with the findings of no PE and stable aortic aneurysm as well as chronic right ventricular dilation and enlargement of the pulmonary artery as well as aortic atherosclerosis emphysema.  BMP shows no significant lecture light or metabolic derangements.  CBC without leukocytosis or acute anemia and platelets of 145.  Initial troponin slightly elevated at 40 I suspect related to some mild demand in the setting of respiratory failure and I  suspect it is an exacerbation of pulmonary artery hypertension and mild volume overload.  This supported by elevated BNP at 1900.  1 month ago was 1300.  VBG without evidence of acute hypercarbic failure.  We will give patient dose of IV Lasix.  While she initially seemed to be doing okay on 3 L and states her baseline saturations 89 to 92% on reassessment she is noted to be 86% on 3 L.  He was bumped up to 6 L nasal to maintain SPO2 of 93%.  I will admit to medicine service for further evaluation and management with a suspect is some volume overload causing exacerbation of her pulmonary artery hypertension and acute on chronic hypoxic respiratory failure.      FINAL CLINICAL IMPRESSION(S) / ED DIAGNOSES   Final diagnoses:  Acute on chronic respiratory failure with hypoxia (HCC)  Troponin I above reference range  Pulmonary artery hypertension (HCC)  Hypervolemia, unspecified hypervolemia type     Rx / DC Orders   ED Discharge Orders     None        Note:  This document was prepared using Dragon voice recognition software and may include unintentional dictation errors.   Lucrezia Starch, MD 06/27/22 [phone]

## 2022-06-27 NOTE — Assessment & Plan Note (Signed)
table 4.5 cm ascending thoracic aortic aneurysm. Recommend semi-annual imaging followup by CTA or MRA and referral to cardiothoracic surgery if not already obtained. This recommendation follows 2010 ACCF/AHA/AATS/ACR/ASA/SCA/SCAI/SIR/STS/SVM Guidelines for the Diagnosis and Management of Patients With Thoracic Aortic Disease. Circulation. 2010; 121: X450-T888. Aortic aneurysm NOS

## 2022-06-27 NOTE — Progress Notes (Signed)
PAETON, LATOUCHE ([id number]) Visit Report for 06/27/2022 Arrival Information Details Patient Name: Kari Medina, Kari Medina Date of Service: 06/27/2022 2:30 PM Medical Record Number: [id number] Patient Account Number: [id number] Date of Birth/Sex: July 30, 1946 (76 y.o. F) Treating RN: Levora Dredge Primary Care Keilany Kari Medina: [name] Other Clinician: Referring Keyontae Huckeby: [name] Treating Leahna Hewson/Extender: Skipper Cliche in Treatment: 59 Visit Information History Since Last Visit Added or deleted any medications: No Patient Arrived: Wheel Chair Any new allergies or adverse reactions: No Arrival Time: 14:52 Had a fall or experienced change in No Accompanied By: husband activities of daily living that may affect Transfer Assistance: EasyPivot Patient Lift risk of falls: Patient Identification Verified: Yes Hospitalized since last visit: No Secondary Verification Process Completed: Yes Has Dressing in Place as Prescribed: Yes Patient Has Alerts: Yes Pain Present Now: No Patient Alerts: Patient on Blood Thinner Electronic Signature(s) Signed: 06/27/2022 4:30:05 PM By: Levora Dredge Entered By: Levora Dredge on 06/27/2022 14:52:49 Brigance, Kari Medina ([id number]) -------------------------------------------------------------------------------- Clinic Level of Care Assessment Details Patient Name: Kari Medina Date of Service: 06/27/2022 2:30 PM Medical Record Number: [id number] Patient Account Number: [id number] Date of Birth/Sex: Mar 16, 1946 (76 y.o. F) Treating RN: Levora Dredge Primary Care Gwenette Wellons: [name] Other Clinician: Referring Rula Keniston: [name] Treating Kirstan Fentress/Extender: Skipper Cliche in Treatment: 13 Clinic Level of Care Assessment Items TOOL 4 Quantity Score '[]'$  - Use when only an EandM is performed on FOLLOW-UP visit 0 ASSESSMENTS - Nursing Assessment / Reassessment X - Reassessment of Co-morbidities (includes updates in patient status) 1 10 X- 1  5 Reassessment of Adherence to Treatment Plan ASSESSMENTS - Wound and Skin Assessment / Reassessment '[]'$  - Simple Wound Assessment / Reassessment - one wound 0 X- 3 5 Complex Wound Assessment / Reassessment - multiple wounds '[]'$  - 0 Dermatologic / Skin Assessment (not related to wound area) ASSESSMENTS - Focused Assessment X - Circumferential Edema Measurements - multi extremities 2 5 '[]'$  - 0 Nutritional Assessment / Counseling / Intervention '[]'$  - 0 Lower Extremity Assessment (monofilament, tuning fork, pulses) '[]'$  - 0 Peripheral Arterial Disease Assessment (using hand held doppler) ASSESSMENTS - Ostomy and/or Continence Assessment and Care '[]'$  - Incontinence Assessment and Management 0 '[]'$  - 0 Ostomy Care Assessment and Management (repouching, etc.) PROCESS - Coordination of Care '[]'$  - Simple Patient / Family Education for ongoing care 0 X- 1 20 Complex (extensive) Patient / Family Education for ongoing care '[]'$  - 0 Staff obtains Programmer, systems, Records, Test Results / Process Orders '[]'$  - 0 Staff telephones HHA, Nursing Homes / Clarify orders / etc '[]'$  - 0 Routine Transfer to another Facility (non-emergent condition) '[]'$  - 0 Routine Hospital Admission (non-emergent condition) '[]'$  - 0 New Admissions / Biomedical engineer / Ordering NPWT, Apligraf, etc. '[]'$  - 0 Emergency Hospital Admission (emergent condition) '[]'$  - 0 Simple Discharge Coordination X- 1 15 Complex (extensive) Discharge Coordination PROCESS - Special Needs '[]'$  - Pediatric / Minor Patient Management 0 '[]'$  - 0 Isolation Patient Management '[]'$  - 0 Hearing / Language / Visual special needs '[]'$  - 0 Assessment of Community assistance (transportation, D/C planning, etc.) '[]'$  - 0 Additional assistance / Altered mentation '[]'$  - 0 Support Surface(s) Assessment (bed, cushion, seat, etc.) INTERVENTIONS - Wound Cleansing / Measurement Goguen, Varnika E. ([id number]) '[]'$  - 0 Simple Wound Cleansing - one wound X- 3 5 Complex Wound  Cleansing - multiple wounds X- 1 5 Wound Imaging (photographs - any number of wounds) '[]'$  - 0 Wound Tracing (instead of photographs) '[]'$  - 0 Simple Wound  Measurement - one wound X- 3 5 Complex Wound Measurement - multiple wounds INTERVENTIONS - Wound Dressings X - Small Wound Dressing one or multiple wounds 3 10 '[]'$  - 0 Medium Wound Dressing one or multiple wounds '[]'$  - 0 Large Wound Dressing one or multiple wounds X- 1 5 Application of Medications - topical '[]'$  - 0 Application of Medications - injection INTERVENTIONS - Miscellaneous '[]'$  - External ear exam 0 '[]'$  - 0 Specimen Collection (cultures, biopsies, blood, body fluids, etc.) '[]'$  - 0 Specimen(s) / Culture(s) sent or taken to Lab for analysis '[]'$  - 0 Patient Transfer (multiple staff / Civil Service fast streamer / Similar devices) '[]'$  - 0 Simple Staple / Suture removal (25 or less) '[]'$  - 0 Complex Staple / Suture removal (26 or more) '[]'$  - 0 Hypo / Hyperglycemic Management (close monitor of Blood Glucose) '[]'$  - 0 Ankle / Brachial Index (ABI) - do not check if billed separately X- 1 5 Vital Signs Has the patient been seen at the hospital within the last three years: Yes Total Score: 150 Level Of Care: New/Established - Level 4 Electronic Signature(s) Signed: 06/27/2022 4:30:05 PM By: Levora Dredge Entered By: Levora Dredge on 06/27/2022 16:00:39 Kari Medina, Kari Medina ([id number]) -------------------------------------------------------------------------------- Encounter Discharge Information Details Patient Name: Kari Medina Date of Service: 06/27/2022 2:30 PM Medical Record Number: [id number] Patient Account Number: [id number] Date of Birth/Sex: 1946/05/23 (75 y.o. F) Treating RN: Levora Dredge Primary Care Maika Mcelveen: [name] Other Clinician: Referring Tyvon Eggenberger: [name] Treating Faizah Kandler/Extender: Skipper Cliche in Treatment: 13 Encounter Discharge Information Items Discharge Condition: Stable Ambulatory Status:  Wheelchair Discharge Destination: Emergency Room Telephoned: Yes Spoke With: amber Orders Sent: Yes Transportation: Private Auto Accompanied By: husband Schedule Follow-up Appointment: Yes Clinical Summary of Care: Notes orders faxed to home health Electronic Signature(s) Signed: 06/27/2022 4:29:08 PM By: Levora Dredge Entered By: Levora Dredge on 06/27/2022 16:29:08 Kari Medina, Kari Medina ([id number]) -------------------------------------------------------------------------------- Lower Extremity Assessment Details Patient Name: Kari Medina Date of Service: 06/27/2022 2:30 PM Medical Record Number: [id number] Patient Account Number: [id number] Date of Birth/Sex: 1946/06/06 (75 y.o. F) Treating RN: Levora Dredge Primary Care Graysen Depaula: [name] Other Clinician: Referring Andrue Dini: [name] Treating Deshonna Trnka/Extender: Skipper Cliche in Treatment: 13 Edema Assessment Assessed: [Left: No] Patrice Paradise: No] Edema: [Left: Yes] [Right: Yes] Calf Left: Right: Point of Measurement: 32 cm From Medial Instep 41 cm 39.8 cm Ankle Left: Right: Point of Measurement: 10 cm From Medial Instep 25 cm 25.4 cm Electronic Signature(s) Signed: 06/27/2022 4:30:05 PM By: Levora Dredge Entered By: Levora Dredge on 06/27/2022 15:06:49 Kari Medina, Kari Medina ([id number]) -------------------------------------------------------------------------------- Multi Wound Chart Details Patient Name: Kari Medina Date of Service: 06/27/2022 2:30 PM Medical Record Number: [id number] Patient Account Number: [id number] Date of Birth/Sex: [birth year] (75 y.o. F) Treating RN: Levora Dredge Primary Care Yarisbel Miranda: [name] Other Clinician: Referring Arval Brandstetter: [name] Treating Cyprian Gongaware/Extender: Skipper Cliche in Treatment: 13 Vital Signs Height(in): Pulse(bpm): 68 Weight(lbs): Blood Pressure(mmHg): 113/76 Body Mass Index(BMI): Temperature(F): Respiratory Rate(breaths/min):  18 Photos: Wound Location: Left, Circumferential Lower Leg Right Lower Leg Left, Posterior Lower Leg Wounding Event: Gradually Appeared Gradually Appeared Shear/Friction Primary Etiology: Diabetic Wound/Ulcer of the Lower Diabetic Wound/Ulcer of the Lower Diabetic Wound/Ulcer of the Lower Extremity Extremity Extremity Comorbid History: Asthma, Chronic Obstructive Asthma, Chronic Obstructive Asthma, Chronic Obstructive Pulmonary Disease (COPD), Sleep Pulmonary Disease (COPD), Sleep Pulmonary Disease (COPD), Sleep Apnea, Hypertension, Type II Apnea, Hypertension, Type II Apnea, Hypertension, Type II Diabetes, Raynaudos, Diabetes, Raynaudos, Diabetes, Raynaudos, Osteoarthritis, Neuropathy Osteoarthritis, Neuropathy Osteoarthritis, Neuropathy  Date Acquired: 12/05/2021 05/01/2022 01/23/2022 Weeks of Treatment: '13 8 4 '$ Wound Status: Open Open Open Wound Recurrence: No No No Clustered Wound: No Yes No Clustered Quantity: N/A 3 N/A Measurements Kari Medina x W x D (cm) 5x8x0.2 1.8x2x0.3 1x1.2x0.2 Area (cm) : 31.416 2.827 0.942 Volume (cm) : 6.283 0.848 0.188 % Reduction in Area: 78.60% -63.60% -22.30% % Reduction in Volume: 57.20% -390.20% -144.20% Classification: Grade 2 Grade 1 Grade 2 Exudate Amount: Medium Medium Medium Exudate Type: Serosanguineous Serosanguineous Serosanguineous Exudate Color: red, brown red, brown red, brown Granulation Amount: Small (1-33%) Small (1-33%) Small (1-33%) Granulation Quality: Red, Pink Red Red Necrotic Amount: Large (67-100%) Medium (34-66%) Large (67-100%) Exposed Structures: Fat Layer (Subcutaneous Tissue): Fat Layer (Subcutaneous Tissue): Fat Layer (Subcutaneous Tissue): Yes Yes Yes Fascia: No Tendon: No Muscle: No Joint: No Bone: No Epithelialization: None None None Treatment Notes Electronic Signature(s) Signed: 06/27/2022 4:30:05 PM By: Debroah Baller ([id number]) Entered By: Levora Dredge on 06/27/2022 15:27:30 Kari Medina, Kari Medina  ([id number]) -------------------------------------------------------------------------------- Multi-Disciplinary Care Plan Details Patient Name: Kari Medina Date of Service: 06/27/2022 2:30 PM Medical Record Number: [id number] Patient Account Number: [id number] Date of Birth/Sex: 10-11-1946 (76 y.o. F) Treating RN: Levora Dredge Primary Care Bastian Andreoli: [name] Other Clinician: Referring Maia Handa: [name] Treating Tesla Keeler/Extender: Skipper Cliche in Treatment: 13 Active Inactive Wound/Skin Impairment Nursing Diagnoses: Impaired tissue integrity Knowledge deficit related to ulceration/compromised skin integrity Goals: Ulcer/skin breakdown will have a volume reduction of 30% by week 4 Date Initiated: 03/27/2022 Target Resolution Date: 04/24/2022 Goal Status: Active Ulcer/skin breakdown will have a volume reduction of 50% by week 8 Date Initiated: 03/27/2022 Target Resolution Date: 05/22/2022 Goal Status: Active Ulcer/skin breakdown will have a volume reduction of 80% by week 12 Date Initiated: 03/27/2022 Target Resolution Date: 06/19/2022 Goal Status: Active Ulcer/skin breakdown will heal within 14 weeks Date Initiated: 03/27/2022 Target Resolution Date: 07/03/2022 Goal Status: Active Interventions: Assess patient/caregiver ability to obtain necessary supplies Assess patient/caregiver ability to perform ulcer/skin care regimen upon admission and as needed Assess ulceration(s) every visit Provide education on ulcer and skin care Notes: Electronic Signature(s) Signed: 06/27/2022 4:30:05 PM By: Levora Dredge Entered By: Levora Dredge on 06/27/2022 15:27:23 Kari Medina, Kari Medina ([id number]) -------------------------------------------------------------------------------- Pain Assessment Details Patient Name: Kari Medina Date of Service: 06/27/2022 2:30 PM Medical Record Number: [id number] Patient Account Number: [id number] Date of Birth/Sex: Jun 10, 1946 (76 y.o. F) Treating  RN: Levora Dredge Primary Care Arthor Gorter: [name] Other Clinician: Referring Ashni Lonzo: [name] Treating Deedra Pro/Extender: Skipper Cliche in Treatment: 13 Active Problems Location of Pain Severity and Description of Pain Patient Has Paino No Site Locations Rate the pain. Current Pain Level: 0 Pain Management and Medication Current Pain Management: Electronic Signature(s) Signed: 06/27/2022 4:30:05 PM By: Levora Dredge Entered By: Levora Dredge on 06/27/2022 14:54:43 Kari Medina, Kari Medina ([id number]) -------------------------------------------------------------------------------- Patient/Caregiver Education Details Patient Name: Kari Medina Date of Service: 06/27/2022 2:30 PM Medical Record Number: [id number] Patient Account Number: [id number] Date of Birth/Gender: [birth year] (75 y.o. F) Treating RN: Levora Dredge Primary Care Physician: [name] Other Clinician: Referring Physician: [name] Treating Physician/Extender: Skipper Cliche in Treatment: 13 Education Assessment Education Provided To: Patient and Caregiver Education Topics Provided Wound/Skin Impairment: Handouts: Caring for Your Ulcer, Other: respiratory issues Methods: Explain/Verbal Responses: State content correctly Electronic Signature(s) Signed: 06/27/2022 4:30:05 PM By: Levora Dredge Entered By: Levora Dredge on 06/27/2022 16:01:24 Kari Medina, Kari Medina ([id number]) -------------------------------------------------------------------------------- Wound Assessment Details Patient Name: Kari Medina Date of Service: 06/27/2022 2:30 PM Medical Record  Number: [id number] Patient Account Number: [id number] Date of Birth/Sex: [birth year] (76 y.o. F) Treating RN: Levora Dredge Primary Care Clavin Ruhlman: [name] Other Clinician: Referring Laquanta Hummel: [name] Treating Lili Harts/Extender: Skipper Cliche in Treatment: 13 Wound Status Wound Number: 3 Primary Diabetic  Wound/Ulcer of the Lower Extremity Etiology: Wound Location: Left, Circumferential Lower Leg Wound Open Wounding Event: Gradually Appeared Status: Date Acquired: 12/05/2021 Comorbid Asthma, Chronic Obstructive Pulmonary Disease (COPD), Weeks Of Treatment: 13 History: Sleep Apnea, Hypertension, Type II Diabetes, Raynaudos, Clustered Wound: No Osteoarthritis, Neuropathy Photos Wound Measurements Length: (cm) 5 Width: (cm) 8 Depth: (cm) 0.2 Area: (cm) 31.416 Volume: (cm) 6.283 % Reduction in Area: 78.6% % Reduction in Volume: 57.2% Epithelialization: None Tunneling: No Undermining: No Wound Description Classification: Grade 2 Exudate Amount: Medium Exudate Type: Serosanguineous Exudate Color: red, brown Foul Odor After Cleansing: No Slough/Fibrino Yes Wound Bed Granulation Amount: Small (1-33%) Exposed Structure Granulation Quality: Red, Pink Fat Layer (Subcutaneous Tissue) Exposed: Yes Necrotic Amount: Large (67-100%) Necrotic Quality: Adherent Slough Treatment Notes Wound #3 (Lower Leg) Wound Laterality: Left, Circumferential Cleanser Normal Saline Discharge Instruction: Wash your hands with soap and water. Remove old dressing, discard into plastic bag and place into trash. Cleanse the wound with Normal Saline prior to applying a clean dressing using gauze sponges, not tissues or cotton balls. Do not scrub or use excessive force. Pat dry using gauze sponges, not tissue or cotton balls. Soap and Water Discharge Instruction: Gently cleanse wound with antibacterial soap, rinse and pat dry prior to dressing wounds Kari Medina, Kari E. ([id number]) Peri-Wound Care Topical Primary Dressing Iodosorb 40 (g) Discharge Instruction: Applied only to two wounds to the midline leg and back of leg. Secondary Dressing ABD Pad 5x9 (in/in) Discharge Instruction: Cover with ABD pad Kerlix 4.5 x 4.1 (in/yd) Discharge Instruction: Apply Kerlix 4.5 x 4.1 (in/yd) as instructed Secured  With Medipore Tape - 75M Medipore H Soft Cloth Surgical Tape, 2x2 (in/yd) Tubigrip Size D, 3x10 (in/yd) Discharge Instruction: Apply 3 Tubigrip D 3-finger-widths below knee to base of toes to secure dressing and/or for swelling. Compression Wrap Compression Stockings Add-Ons Electronic Signature(s) Signed: 06/27/2022 4:30:05 PM By: Levora Dredge Entered By: Levora Dredge on 06/27/2022 15:05:11 Kari Medina, Kari Medina ([id number]) -------------------------------------------------------------------------------- Wound Assessment Details Patient Name: Kari Medina Date of Service: 06/27/2022 2:30 PM Medical Record Number: [id number] Patient Account Number: [id number] Date of Birth/Sex: [birth year] (76 y.o. F) Treating RN: Levora Dredge Primary Care Delaine Hernandez: [name] Other Clinician: Referring Neriyah Cercone: [name] Treating Arriana Lohmann/Extender: Skipper Cliche in Treatment: 13 Wound Status Wound Number: 4 Primary Diabetic Wound/Ulcer of the Lower Extremity Etiology: Wound Location: Right Lower Leg Wound Open Wounding Event: Gradually Appeared Status: Date Acquired: 05/01/2022 Comorbid Asthma, Chronic Obstructive Pulmonary Disease (COPD), Weeks Of Treatment: 8 History: Sleep Apnea, Hypertension, Type II Diabetes, Raynaudos, Clustered Wound: Yes Osteoarthritis, Neuropathy Photos Wound Measurements Length: (cm) 1.8 Width: (cm) 2 Depth: (cm) 0.3 Clustered Quantity: 3 Area: (cm) 2.827 Volume: (cm) 0.848 % Reduction in Area: -63.6% % Reduction in Volume: -390.2% Epithelialization: None Tunneling: No Undermining: No Wound Description Classification: Grade 1 Exudate Amount: Medium Exudate Type: Serosanguineous Exudate Color: red, brown Foul Odor After Cleansing: No Slough/Fibrino Yes Wound Bed Granulation Amount: Small (1-33%) Exposed Structure Granulation Quality: Red Fat Layer (Subcutaneous Tissue) Exposed: Yes Necrotic Amount: Medium (34-66%) Necrotic Quality:  Adherent Slough Treatment Notes Wound #4 (Lower Leg) Wound Laterality: Right Cleanser Normal Saline Discharge Instruction: Wash your hands with soap and water. Remove old dressing, discard into plastic bag and place into  trash. Cleanse the wound with Normal Saline prior to applying a clean dressing using gauze sponges, not tissues or cotton balls. Do not scrub or use excessive force. Pat dry using gauze sponges, not tissue or cotton balls. Soap and Water Discharge Instruction: Gently cleanse wound with antibacterial soap, rinse and pat dry prior to dressing wounds Kari Medina, Kari E. ([id number]) Peri-Wound Care Topical Primary Dressing Iodosorb 40 (g) Discharge Instruction: Applied only to two wounds to the midline leg and back of leg. Secondary Dressing ABD Pad 5x9 (in/in) Discharge Instruction: Cover with ABD pad Kerlix 4.5 x 4.1 (in/yd) Discharge Instruction: Apply Kerlix 4.5 x 4.1 (in/yd) as instructed Secured With Medipore Tape - 29M Medipore H Soft Cloth Surgical Tape, 2x2 (in/yd) Tubigrip Size D, 3x10 (in/yd) Discharge Instruction: Apply 3 Tubigrip D 3-finger-widths below knee to base of toes to secure dressing and/or for swelling. Compression Wrap Compression Stockings Add-Ons Electronic Signature(s) Signed: 06/27/2022 4:30:05 PM By: Levora Dredge Entered By: Levora Dredge on 06/27/2022 15:05:47 Kari Medina, Kari Medina ([id number]) -------------------------------------------------------------------------------- Wound Assessment Details Patient Name: Kari Medina Date of Service: 06/27/2022 2:30 PM Medical Record Number: [id number] Patient Account Number: [id number] Date of Birth/Sex: 09-06-1946 (75 y.o. F) Treating RN: Levora Dredge Primary Care Lashawnna Lambrecht: [name] Other Clinician: Referring Bari Handshoe: [name] Treating Ayianna Darnold/Extender: Skipper Cliche in Treatment: 13 Wound Status Wound Number: 5 Primary Diabetic Wound/Ulcer of the Lower  Extremity Etiology: Wound Location: Left, Posterior Lower Leg Wound Open Wounding Event: Shear/Friction Status: Date Acquired: 01/23/2022 Comorbid Asthma, Chronic Obstructive Pulmonary Disease (COPD), Weeks Of Treatment: 4 History: Sleep Apnea, Hypertension, Type II Diabetes, Raynaudos, Clustered Wound: No Osteoarthritis, Neuropathy Photos Wound Measurements Length: (cm) 1 Width: (cm) 1.2 Depth: (cm) 0.2 Area: (cm) 0.942 Volume: (cm) 0.188 % Reduction in Area: -22.3% % Reduction in Volume: -144.2% Epithelialization: None Tunneling: No Undermining: No Wound Description Classification: Grade 2 Exudate Amount: Medium Exudate Type: Serosanguineous Exudate Color: red, brown Foul Odor After Cleansing: No Slough/Fibrino Yes Wound Bed Granulation Amount: Small (1-33%) Exposed Structure Granulation Quality: Red Fascia Exposed: No Necrotic Amount: Large (67-100%) Fat Layer (Subcutaneous Tissue) Exposed: Yes Necrotic Quality: Adherent Slough Tendon Exposed: No Muscle Exposed: No Joint Exposed: No Bone Exposed: No Treatment Notes Wound #5 (Lower Leg) Wound Laterality: Left, Posterior Cleanser Normal Saline Discharge Instruction: Wash your hands with soap and water. Remove old dressing, discard into plastic bag and place into trash. Cleanse the wound with Normal Saline prior to applying a clean dressing using gauze sponges, not tissues or cotton balls. Do not scrub or use excessive force. Pat dry using gauze sponges, not tissue or cotton balls. CIERRAH, DACE ([id number]) Soap and Water Discharge Instruction: Gently cleanse wound with antibacterial soap, rinse and pat dry prior to dressing wounds Peri-Wound Care Topical Primary Dressing Iodosorb 40 (g) Discharge Instruction: Applied only to two wounds to the midline leg and back of leg. Secondary Dressing ABD Pad 5x9 (in/in) Discharge Instruction: Cover with ABD pad Secured With Tubigrip Size D, 3x10 (in/yd) Discharge  Instruction: Apply 3 Tubigrip D 3-finger-widths below knee to base of toes to secure dressing and/or for swelling. Compression Wrap Compression Stockings Add-Ons Electronic Signature(s) Signed: 06/27/2022 4:30:05 PM By: Levora Dredge Entered By: Levora Dredge on 06/27/2022 15:06:18 Pesantez, SHABNAM LADD ([id number]) -------------------------------------------------------------------------------- Vitals Details Patient Name: Kari Medina Date of Service: 06/27/2022 2:30 PM Medical Record Number: [id number] Patient Account Number: [id number] Date of Birth/Sex: Jun 02, 1946 (76 y.o. F) Treating RN: Levora Dredge Primary Care Melanie Openshaw: [name] Other Clinician: Referring Graciano Batson: [name]  Treating Deven Audi/Extender: Jeri Cos Weeks in Treatment: 13 Vital Signs Time Taken: 14:52 Pulse (bpm): 61 Respiratory Rate (breaths/min): 18 Blood Pressure (mmHg): 113/76 Reference Range: 80 - 120 mg / dl Airway Pulse Oximetry (%): 86 Inhaled Oxygen Concentration (%): 3 Notes PA Stone made aware of decreased saturations, patient placed on one of our oxygen tanks and patient states feeling better but is tired today. PA Stone made aware unable to obtain temperature. After a few minutes on our tank at 3L O2 patients saturations are at 93% Electronic Signature(s) Signed: 06/27/2022 4:30:05 PM By: Levora Dredge Entered By: Levora Dredge on 06/27/2022 14:54:37

## 2022-06-27 NOTE — H&P (Signed)
History and Physical    Patient: Kari Medina EGB:[id number] DOB: 04-Feb-1946 DOA: 06/27/2022 DOS: the patient was seen and examined on 06/27/2022 PCP: Venia Carbon, MD  Patient coming from: Home  Chief Complaint:  Chief Complaint  Patient presents with   Shortness of Breath    HPI: Kari Medina is a 76 y.o. female with medical history significant for Pulmonary artery hypertension on right heart cath 12/2021, followed by Dr. Gilles Chiquito, ascending thoracic aortic aneurysm 4.5 cm, class III obesity, COPD on chronic home O2 at 3 L, grade 1 diastolic dysfunction, DM,OSA on CPAP,  chronic lower extremity edema, recently on increasing doses of Lasix, now up to 40 mg daily, and who also has bilateral nonhealing painful pressure wounds for which she is followed at the wound clinic, who was sent by the wound clinic for admission for volume overload that is negatively impacting the ability of her wounds to heal.  Patient states that the swelling has not been responding to increasing dose of Lasix and it has been impacting her mobility.  She is chronically fatigued and chronically desaturates with minimal exertion and feels like she is at her baseline in that regard.  She denies chest pain, cough or fever and chills.ED course on chart review: O2 sat 86 to 89% on 3 L requiring increase in O2 to 6 L to maintain sats in the 90s.  Vitals otherwise within normal limits Labs: VBG with pH 7.41, PCO2 46 and bicarb 29.2.  Troponin 37 and BNP 1906.  CBC and BMP mostly unremarkable except for creatinine 1.25 up from baseline of 0.98 EKG, personally viewed and interpreted with NSR at 63 with nonspecific ST-T wave changes CTA chest showing the following findings: IMPRESSION: 1. No evidence of pulmonary embolus. 2. Stable 4.5 cm ascending thoracic aortic aneurysm. Recommend semi-annual imaging followup by CTA or MRA and referral to cardiothoracic surgery if not already obtained. This  recommendation follows 2010 ACCF/AHA/AATS/ACR/ASA/SCA/SCAI/SIR/STS/SVM Guidelines for the Diagnosis and Management of Patients With Thoracic Aortic Disease. Circulation. 2010; 121: P710-G269. Aortic aneurysm NOS (ICD10-I71.9) 3. Chronic right ventricular dilation and enlargement of the main pulmonary artery, consistent with pulmonary arterial hypertension. 4. Aortic Atherosclerosis (ICD10-I70.0) and Emphysema (ICD10-J43.9).   Chest x-ray showed the following: IMPRESSION: 1. Airspace disease in the left lower lobe, possible pneumonia. Imaging follow-up to resolution is recommended 2. Cardiomegaly. Enlarged central pulmonary arteries consistent with arterial hypertension  Patient treated with IV Lasix.  Hospitalist consulted for admission.       Past Medical History:  Diagnosis Date   Asthma    Chronic hypoxemic respiratory failure (HCC)    uses O2 with exertion and with CPAP   COPD (chronic obstructive pulmonary disease) (HCC)    Depression    Diabetes mellitus without complication (HCC)    Dyspnea    doe   Dysrhythmia    extra beat   Fatty liver    Fibromyalgia    Generalized osteoarthritis of multiple sites    History of hiatal hernia    Hypertension    Hypothyroidism    Melanoma ([place]) 07/2018   Right leg   OSA (obstructive sleep apnea)    Pain    chronic ruq and back pain   Panic attacks    PONV (postoperative nausea and vomiting)    after thyroidectomy   Raynaud disease    RLS (restless legs syndrome)    Spleen absent    TOLD ABSENT THEN TOLD DOES HAVE SPLEEN. PATIENT IS UNCERTAIN   Tremor,  essential    Wears dentures    full upper and lower   Past Surgical History:  Procedure Laterality Date   CATARACT EXTRACTION W/PHACO Right 03/04/2021   Procedure: CATARACT EXTRACTION PHACO AND INTRAOCULAR LENS PLACEMENT ([facility]) RIGHT DIABETIC 3.98 00:33.2;  Surgeon: Eulogio Bear, MD;  Location: [facility];  Service: Ophthalmology;  Laterality: Right;   Diabetic - oral meds   CATARACT EXTRACTION W/PHACO Left 03/18/2021   Procedure: CATARACT EXTRACTION PHACO AND INTRAOCULAR LENS PLACEMENT ([place]) LEFT;  Surgeon: Eulogio Bear, MD;  Location: [facility];  Service: Ophthalmology;  Laterality: Left;  2.96 0:27.9   CHOLECYSTECTOMY     DILATATION & CURETTAGE/HYSTEROSCOPY WITH MYOSURE N/A 11/10/2018   Procedure: DILATATION & CURETTAGE/HYSTEROSCOPY WITH MYOSURE/MYOMECTOMY;  Surgeon: Aletha Halim, MD;  Location: [facility];  Service: Gynecology;  Laterality: N/A;  possible myosure.  Please use myosure scope, do not open myosure blades but have in the room   JOINT REPLACEMENT Bilateral    2008/2011   MELANOMA EXCISION  07/2018   REVERSE SHOULDER ARTHROPLASTY Right 05/28/2017   Procedure: REVERSE SHOULDER ARTHROPLASTY;  Surgeon: Corky Mull, MD;  Location: [facility];  Service: Orthopedics;  Laterality: Right;   RHINOPLASTY  1972   RIGHT HEART CATH N/A 12/27/2021   Procedure: RIGHT HEART CATH;  Surgeon: Andrez Grime, MD;  Location: [facility];  Service: Cardiovascular;  Laterality: N/A;   THYROIDECTOMY  2006   TOTAL KNEE ARTHROPLASTY     bilateral   Social History:  reports that she quit smoking about 33 years ago. Her smoking use included cigarettes. She has a 1.25 pack-year smoking history. She has never used smokeless tobacco. She reports that she does not drink alcohol and does not use drugs.  Allergies  Allergen Reactions   Latex Hives    Tape and bandaids only   Nickel Dermatitis   Pramipexole Other (See Comments)    Caused hallucinations, says she can take name brand.   Prednisone     Makes her "feel crazy" - can take low dosage    Topiramate Other (See Comments)    "spaced out"   Citalopram Anxiety   Paroxetine Hcl Anxiety   Sertraline Anxiety    Family History  Problem Relation Age of Onset   Osteoarthritis Mother    Diabetes Mother    Cirrhosis Mother    Cancer Father    Kidney  cancer Father    Bladder Cancer Father    Heart disease Brother        stents in 1 brother   Breast cancer Neg Hx     Prior to Admission medications   Medication Sig Start Date End Date Taking? Authorizing Provider  acetaminophen (TYLENOL) 650 MG CR tablet Take 650 mg by mouth every 8 (eight) hours as needed for pain.    [provider]  albuterol (PROVENTIL HFA) 108 (90 Base) MCG/ACT inhaler Inhale 2 puffs into the lungs every 6 (six) hours as needed for wheezing or shortness of breath. 01/06/20   Venia Carbon, MD  aspirin 325 MG EC tablet Take 162.5 mg by mouth daily.    [provider]  budesonide-formoterol (SYMBICORT) 160-4.5 MCG/ACT inhaler Inhale 2 puffs into the lungs 2 (two) times daily. 03/05/22   Venia Carbon, MD  calcium carbonate (OS-CAL) 600 MG TABS Take 600 mg by mouth daily with breakfast.    [provider]  DULoxetine (CYMBALTA) 60 MG capsule TAKE 1 CAPSULE BY MOUTH  DAILY 12/09/21   Venia Carbon, MD  fluconazole (DIFLUCAN) 150 MG tablet Take 1 tablet (150 mg total) by mouth once a week. As needed 06/02/22   Venia Carbon, MD  furosemide (LASIX) 40 MG tablet TAKE ONE TABLET BY MOUTH DAILY 05/16/22   [name] I, MD  glucose blood (ONETOUCH VERIO) test strip Use to check blood sugar once a day. E11.9 02/21/22   Venia Carbon, MD  ketoconazole (NIZORAL) 2 % cream Apply 1 application  topically daily. 06/02/22   Venia Carbon, MD  levothyroxine (SYNTHROID) 200 MCG tablet Take 1 tablet (200 mcg total) by mouth daily before breakfast. 05/23/22   Venia Carbon, MD  metFORMIN (GLUCOPHAGE-XR) 500 MG 24 hr tablet TAKE 1 TABLET BY MOUTH  DAILY WITH BREAKFAST 12/09/21   [name] I, MD  montelukast (SINGULAIR) 10 MG tablet TAKE 1 TABLET BY MOUTH AT  BEDTIME 12/09/21   Venia Carbon, MD  [facility] DELICA LANCETS FINE MISC 1 each by Does not apply route daily. Use to check blood sugar once a day. E11.9 09/30/18   Venia Carbon, MD  propranolol (INDERAL) 40 MG tablet TAKE 1 TABLET BY MOUTH  TWICE DAILY 04/25/22   Venia Carbon, MD  rOPINIRole (REQUIP) 3 MG tablet Take 1 tablet (3 mg total) by mouth at bedtime. 03/13/22   Venia Carbon, MD  spironolactone (ALDACTONE) 25 MG tablet TAKE ONE TABLET BY MOUTH DAILY 05/16/22   Venia Carbon, MD  sulfamethoxazole-trimethoprim (BACTRIM DS) 800-160 MG tablet Take 1 tablet by mouth 2 (two) times daily. For 3 days---for urine symptoms 06/02/22   Venia Carbon, MD    Physical Exam: Vitals:   06/27/22 1641 06/27/22 1907 06/27/22 1909 06/27/22 1915  BP: 120/87   127/76  Pulse: 68 62 69 65  Resp: 18   17  Temp: 98.2 F (36.8 C)   98.4 F (36.9 C)  TempSrc: Oral   Oral  SpO2: (!) 89% (!) 86% (!) 89% 91%   Physical Exam Vitals and nursing note reviewed.  Constitutional:      General: She is not in acute distress. HENT:     Head: Normocephalic and atraumatic.  Cardiovascular:     Rate and Rhythm: Normal rate and regular rhythm.     Heart sounds: Normal heart sounds.  Pulmonary:     Effort: Pulmonary effort is normal.     Breath sounds: Normal breath sounds.  Abdominal:     Palpations: Abdomen is soft.     Tenderness: There is no abdominal tenderness.  Musculoskeletal:     Comments: Legs in bandages  Neurological:     Mental Status: Mental status is at baseline.     Labs on Admission: I have personally reviewed following labs and imaging studies  CBC: Recent Labs  Lab 06/27/22 1643  WBC 7.4  HGB 16.3*  HCT 49.1*  MCV 96.8  PLT 160*   Basic Metabolic Panel: Recent Labs  Lab 06/27/22 1643  NA 140  K 4.2  CL 105  CO2 26  GLUCOSE 94  BUN 24*  CREATININE 1.25*  CALCIUM 9.1   GFR: CrCl cannot be calculated (Unknown ideal weight.). Liver Function Tests: No results for input(s): "AST", "ALT", "ALKPHOS", "BILITOT", "PROT", "ALBUMIN" in the last 168 hours. No results for input(s): "LIPASE", "AMYLASE" in the last 168 hours. No  results for input(s): "AMMONIA" in the last 168 hours. Coagulation Profile: Recent Labs  Lab 06/27/22 1708  INR 1.2  Cardiac Enzymes: No results for input(s): "CKTOTAL", "CKMB", "CKMBINDEX", "TROPONINI" in the last 168 hours. BNP (last 3 results) No results for input(s): "PROBNP" in the last 8760 hours. HbA1C: No results for input(s): "HGBA1C" in the last 72 hours. CBG: No results for input(s): "GLUCAP" in the last 168 hours. Lipid Profile: No results for input(s): "CHOL", "HDL", "LDLCALC", "TRIG", "CHOLHDL", "LDLDIRECT" in the last 72 hours. Thyroid Function Tests: No results for input(s): "TSH", "T4TOTAL", "FREET4", "T3FREE", "THYROIDAB" in the last 72 hours. Anemia Panel: No results for input(s): "VITAMINB12", "FOLATE", "FERRITIN", "TIBC", "IRON", "RETICCTPCT" in the last 72 hours. Urine analysis:    Component Value Date/Time   COLORURINE AMBER (A) 10/23/2018 1425   APPEARANCEUR CLOUDY (A) 10/23/2018 1425   LABSPEC 1.014 10/23/2018 1425   PHURINE 5.0 10/23/2018 1425   GLUCOSEU NEGATIVE 10/23/2018 1425   HGBUR MODERATE (A) 10/23/2018 1425   BILIRUBINUR negative 05/05/2022 1201   KETONESUR 20 (A) 10/23/2018 1425   PROTEINUR Positive (A) 05/05/2022 1201   PROTEINUR 100 (A) 10/23/2018 1425   UROBILINOGEN 1.0 05/05/2022 1201   UROBILINOGEN 0.2 11/08/2010 1253   NITRITE positive 05/05/2022 1201   NITRITE NEGATIVE 10/23/2018 1425   LEUKOCYTESUR Negative 05/05/2022 1201    Radiological Exams on Admission: CT Angio Chest PE W and/or Wo Contrast  Result Date: 06/27/2022 CLINICAL DATA:  Increased work of breathing, shortness of breath, hypoxia EXAM: CT ANGIOGRAPHY CHEST WITH CONTRAST TECHNIQUE: Multidetector CT imaging of the chest was performed using the standard protocol during bolus administration of intravenous contrast. Multiplanar CT image reconstructions and MIPs were obtained to evaluate the vascular anatomy. RADIATION DOSE REDUCTION: This exam was performed according to  the departmental dose-optimization program which includes automated exposure control, adjustment of the mA and/or kV according to patient size and/or use of iterative reconstruction technique. CONTRAST:  17m OMNIPAQUE IOHEXOL 350 MG/ML SOLN COMPARISON:  06/27/2022, 11/19/2021 FINDINGS: Cardiovascular: This is a technically adequate evaluation of the pulmonary vasculature. Slight mixing artifact within the main pulmonary arteries. No filling defects or pulmonary emboli. Dilated main pulmonary artery consistent with pulmonary arterial hypertension. The heart is enlarged without pericardial effusion, with prominent right ventricular dilation again noted. 4.5 cm ascending thoracic aortic aneurysm not significantly changed since prior study. Evaluation of the aortic lumen is limited due to timing of contrast bolus. Stable aortic and coronary artery atherosclerosis. Mediastinum/Nodes: No enlarged mediastinal, hilar, or axillary lymph nodes. Thyroid gland, trachea, and esophagus demonstrate no significant findings. Lungs/Pleura: Scattered hypoventilatory changes are seen bilaterally, greatest in the right middle lobe and lingula. No acute airspace disease, effusion, or pneumothorax. Background emphysema unchanged. Central airways are patent. Upper Abdomen: No acute abnormality. Musculoskeletal: Chronic left posterior ninth through eleventh rib fractures are noted, with moderate callus formation. No acute bony abnormalities. Right shoulder arthroplasty. Reconstructed images demonstrate no additional findings. Review of the MIP images confirms the above findings. IMPRESSION: 1. No evidence of pulmonary embolus. 2. Stable 4.5 cm ascending thoracic aortic aneurysm. Recommend semi-annual imaging followup by CTA or MRA and referral to cardiothoracic surgery if not already obtained. This recommendation follows 2010 ACCF/AHA/AATS/ACR/ASA/SCA/SCAI/SIR/STS/SVM Guidelines for the Diagnosis and Management of Patients With Thoracic  Aortic Disease. Circulation. 2010; 121:: Y301-S010 Aortic aneurysm NOS (ICD10-I71.9) 3. Chronic right ventricular dilation and enlargement of the main pulmonary artery, consistent with pulmonary arterial hypertension. 4. Aortic Atherosclerosis (ICD10-I70.0) and Emphysema (ICD10-J43.9). Electronically Signed   By: MRanda NgoM.D.   On: 06/27/2022 18:50   DG Chest 2 View  Result Date: 06/27/2022 CLINICAL DATA:  Shortness  of breath EXAM: CHEST - 2 VIEW COMPARISON:  11/21/2021, CT 11/19/2021, chest x-ray 10/23/2018 FINDINGS: Mild cardiomegaly. Enlarged central pulmonary vessels suspicious for arterial hypertension. Airspace disease within the left lung base and posteriorly on lateral view. No pleural effusion or pneumothorax. Hardware in the right shoulder IMPRESSION: 1. Airspace disease in the left lower lobe, possible pneumonia. Imaging follow-up to resolution is recommended 2. Cardiomegaly. Enlarged central pulmonary arteries consistent with arterial hypertension Electronically Signed   By: Donavan Foil M.D.   On: 06/27/2022 17:23     Data Reviewed: Relevant notes from primary care and specialist visits, past discharge summaries as available in EHR, including Care Everywhere. Prior diagnostic testing as pertinent to current admission diagnoses Updated medications and problem lists for reconciliation ED course, including vitals, labs, imaging, treatment and response to treatment Triage notes, nursing and pharmacy notes and ED provider's notes Notable results as noted in HPI   Assessment and Plan: Recurrent venous ulcer of lower extremity ([place]) Followed at the wound center and has a history of nonhealing wounds Wound care consult  Acute on chronic diastolic CHF (congestive heart failure) ([place]) Last echo on record from December 2022 showing EF 75% with indeterminate diastolic dysfunction however review of pulmonary notes showing grade 1 DD Chest x-ray showed airspace disease left lower lobe,  possible pneumonia, cardiomegaly and enlarged central pulmonary arteries consistent with arterial hypertension We will repeat echo BNP 1906 Continue Lasix with monitoring of kidney function.  Continue propranolol and spironolactone Daily weights with intake and output monitoring We will get cardiology to assist with medication optimization and management.  Patient has seen [facility] cardiologist, Dr. Corky Sox in the past  Acute on chronic respiratory failure with hypoxia ([place]) O2 sat 86 to 89% on 3 L requiring increase in O2 to 6 L to maintain sats in the 90s.VBG with pH 7.41, PCO2 46 and bicarb 29.2. Secondary to fluid overload, from diastolic CHF and chronic severe pulmonary hypertension Patient requiring increasing supplemental oxygen from baseline Treat acute conditions and wean O2 as tolerated   AKI (acute kidney injury) ([place]) Likely secondary to increased diuretic dosing in the outpatient.  Creatinine 1.25 up from baseline of 0.98 in December 2022 Monitor renal function and avoid nephrotoxins  Pulmonary hypertension ([place]) History of severe pulmonary hypertension, followed by Dr. Gilles Chiquito and Dr. Raul Del On IV Lasix, propranolol and spironolactone  Diabetes mellitus without complication (HCC) Sliding scale insulin coverage  Hypothyroidism Continue levothyroxine  Thoracic ascending aortic aneurysm (HCC) table 4.5 cm ascending thoracic aortic aneurysm. Recommend semi-annual imaging followup by CTA or MRA and referral to cardiothoracic surgery if not already obtained. This recommendation follows 2010 ACCF/AHA/AATS/ACR/ASA/SCA/SCAI/SIR/STS/SVM Guidelines for the Diagnosis and Management of Patients With Thoracic Aortic Disease. Circulation. 2010; 121: H086-V784. Aortic aneurysm NOS  Obesity, Class III, BMI 40-49.9 (morbid obesity) (HCC) Complicating factor to overall prognosis and care  OSA (obstructive sleep apnea) Continue CPAP nightly  Hypertension BP currently controlled.   Continue propranolol and spironolactone        DVT prophylaxis: Lovenox  Consults: Cardiology, [place], Dr. Corky Sox  Advance Care Planning:   Code Status: Prior   Family Communication: none  Disposition Plan: Back to previous home environment  Severity of Illness: The appropriate patient status for this patient is INPATIENT. Inpatient status is judged to be reasonable and necessary in order to provide the required intensity of service to ensure the patient's safety. The patient's presenting symptoms, physical exam findings, and initial radiographic and laboratory data in the context of their chronic  comorbidities is felt to place them at high risk for further clinical deterioration. Furthermore, it is not anticipated that the patient will be medically stable for discharge from the hospital within 2 midnights of admission.   * I certify that at the point of admission it is my clinical judgment that the patient will require inpatient hospital care spanning beyond 2 midnights from the point of admission due to high intensity of service, high risk for further deterioration and high frequency of surveillance required.*  Author: Athena Masse, MD 06/27/2022 9:09 PM  For on call review [email or web address].

## 2022-06-27 NOTE — Assessment & Plan Note (Signed)
Followed at the wound center and has a history of nonhealing wounds Wound care consult

## 2022-06-27 NOTE — Assessment & Plan Note (Signed)
Likely secondary to increased diuretic dosing in the outpatient.  Creatinine 1.25 up from baseline of 0.98 in December 2022 Monitor renal function and avoid nephrotoxins

## 2022-06-27 NOTE — Assessment & Plan Note (Signed)
O2 sat 86 to 89% on 3 L requiring increase in O2 to 6 L to maintain sats in the 90s.VBG with pH 7.41, PCO2 46 and bicarb 29.2. Secondary to fluid overload, from diastolic CHF and chronic severe pulmonary hypertension Patient requiring increasing supplemental oxygen from baseline Treat acute conditions and wean O2 as tolerated

## 2022-06-27 NOTE — Progress Notes (Addendum)
LILYGRACE, RODICK ([id number]) Visit Report for 06/27/2022 Chief Complaint Document Details Patient Name: Kari Medina, Kari Medina Date of Service: 06/27/2022 2:30 PM Medical Record Number: [id number] Patient Account Number: [id number] Date of Birth/Sex: 20-Sep-1946 (76 y.o. F) Treating RN: Levora Dredge Primary Care Provider: [name] Other Clinician: Referring Provider: [name] Treating Provider/Extender: Skipper Cliche in Treatment: 13 Information Obtained from: Patient Chief Complaint Bilateral LE Ulcers Electronic Signature(s) Signed: 06/27/2022 3:00:44 PM By: Worthy Keeler PA-C Entered By: Worthy Keeler on 06/27/2022 15:00:44 Rolston, Loura Pardon ([id number]) -------------------------------------------------------------------------------- HPI Details Patient Name: Kari Medina Date of Service: 06/27/2022 2:30 PM Medical Record Number: [id number] Patient Account Number: [id number] Date of Birth/Sex: 1946-07-03 (75 y.o. F) Treating RN: Levora Dredge Primary Care Provider: [name] Other Clinician: Referring Provider: [name] Treating Provider/Extender: Skipper Cliche in Treatment: 13 History of Present Illness HPI Description: 03-27-2022 upon evaluation today patient presents for initial evaluation here in the clinic concerning issues she has been having with her wounds on her legs. Subsequently she is telling me that they burn and do hurt quite a bit. Unfortunately she is not really in the state of mind she tells me to allow for any sharp debridement for that reason were probably can have to try something different from the standpoint of topical medications and under wrap in order to try to get this under control. Fortunately I do believe this is good to be something we can likely heal but again it absolutely has to involve getting the swelling down if working to see any signs of improvement. The patient in particular tells me this started December 2022 while she  was in the hospital. She does go to [place] primary care in March they sent her here. Currently she has been using Silvadene cream which is really not helping. She did have some falls not too far back where she did have some broken ribs with bruising. The patient does have a history of diabetes mellitus type 2 with a hemoglobin A1c of 7.23 October 2021 most recent. She also has a history of pulmonary hypertension, Raynaud's syndrome without gangrene, and hypertension in general. 05-01-2022 upon evaluation today patient appears to be doing okay in regard to her wounds. She has been tolerating the Iodoflex without significant complication. With that being said she does tell me that it burns somewhat but she is able to tolerate this they have gotten some ice packs that she uses which seems to soothe things to some degree. This is good news. With that being said I do not see any evidence of active infection locally or systemically at this time. She does note that it is really can be hard for her to come back and be seen frequently she request at least a 1 month check in between each time and to let home health manage things in between. 05-29-2022 upon evaluation today patient appears to be doing well currently in regard to her wound she is definitely showing signs of improvement which is great news. Fortunately I do not see any evidence of active infection locally or systemically which is also great news. No fevers, chills, nausea, vomiting, or diarrhea. 06-27-2022 upon evaluation today patient's wounds actually are doing about the same she still has a lot of leaking from these areas. With that being said I do not believe that this is just a venous issue I believe honestly she may be in a congestive heart failure exacerbation to be peripherally honest. I discussed with her  and her husband today that I really do believe she probably needs to go to the hospital. She was very reluctant to do so which I can  completely understand. Nonetheless in the end as I discussed this further I ended up calling her primary care provider's office as well and they essentially ended up with the same recommendation based on her oxygen saturations. Electronic Signature(s) Signed: 06/27/2022 4:57:54 PM By: Worthy Keeler PA-C Entered By: Worthy Keeler on 06/27/2022 16:57:54 Brau, Loura Pardon ([id number]) -------------------------------------------------------------------------------- Physical Exam Details Patient Name: Kari Medina Date of Service: 06/27/2022 2:30 PM Medical Record Number: [id number] Patient Account Number: [id number] Date of Birth/Sex: Nov 06, 1946 (75 y.o. F) Treating RN: Levora Dredge Primary Care Provider: [name] Other Clinician: Referring Provider: [name] Treating Provider/Extender: Skipper Cliche in Treatment: 35 Constitutional Well-nourished and well-hydrated in no acute distress. Respiratory normal breathing without difficulty. Psychiatric this patient is able to make decisions and demonstrates good insight into disease process. Alert and Oriented x 3. pleasant and cooperative. Notes Upon inspection patient's wound bed actually showed signs of good granulation and epithelization at this point. Fortunately I do not see any evidence of infection locally or systemically and I am pleased in that regard we are using Iodosorb at this point. With that being said my biggest concern based on what I am seeing is simply that the patient is going to require I feel like a stronger diuresis than what she is getting with the Lasix at home. I really feel like she may need to go and to the hospital due to the fact that her oxygen saturations are extremely low. She was upon arrival today at around 80-82. Once we got her on the 3 L of oxygen we can get her up to around 90. With that being said by the time that she started talking to tell me what was going on with her dressings just that  little bit of talking would cause her to drop back down to around 80. This went on the majority of the visit as we were discussing things today. In the end I ended up going ahead forward with a recommendation that she go to the ER for further evaluation and we did call the triage nurse there as well. Electronic Signature(s) Signed: 06/27/2022 4:59:46 PM By: Worthy Keeler PA-C Entered By: Worthy Keeler on 06/27/2022 16:59:46 Phillips, Loura Pardon ([id number]) -------------------------------------------------------------------------------- Physician Orders Details Patient Name: Kari Medina Date of Service: 06/27/2022 2:30 PM Medical Record Number: [id number] Patient Account Number: [id number] Date of Birth/Sex: Feb 12, 1946 (75 y.o. F) Treating RN: Levora Dredge Primary Care Provider: [name] Other Clinician: Referring Provider: [name] Treating Provider/Extender: Skipper Cliche in Treatment: 13 Verbal / Phone Orders: No Diagnosis Coding ICD-10 Coding Code Description E11.622 Type 2 diabetes mellitus with other skin ulcer L97.812 Non-pressure chronic ulcer of other part of right lower leg with fat layer exposed L97.822 Non-pressure chronic ulcer of other part of left lower leg with fat layer exposed I27.29 Other secondary pulmonary hypertension I73.00 Raynaud's syndrome without gangrene I10 Essential (primary) hypertension Follow-up Appointments o Return Appointment in 1 month - per pt request o Nurse Visit as needed [place]: - [facility] o ADMIT to Home Health for wound care. May utilize formulary equivalent dressing for wound treatment orders unless otherwise specified. Home Health Nurse may visit PRN to address patientos wound care needs. o Scheduled days for dressing changes to be completed; exception, patient has scheduled  wound care visit that day. o **Please direct any NON-WOUND related issues/requests for orders to patient's  Primary Care Physician. **If current dressing causes regression in wound condition, may D/C ordered dressing product/s and apply Normal Saline Moist Dressing daily until next [place] or Other MD appointment. **Notify [facility] of regression in wound condition at [phone]. Bathing/ Shower/ Hygiene o Wash wounds with antibacterial soap and water. o May shower with wound dressing protected with water repellent cover or cast protector. o No tub bath. Edema Control - Lymphedema / Segmental Compressive Device / Other o Tubigrip single layer applied. - apply to each leg o Elevate leg(s) parallel to the floor when sitting. o DO YOUR BEST to sleep in the bed at night. DO NOT sleep in your recliner. Long hours of sitting in a recliner leads to swelling of the legs and/or potential wounds on your backside. Non-Wound Condition o Additional non-wound orders/instructions: - Eucerin or cedafil lotion for legs to help with comfort and itching. Wound Treatment Wound #3 - Lower Leg Wound Laterality: Left, Circumferential Cleanser: Normal Saline 3 x Per Week/30 Days Discharge Instructions: Wash your hands with soap and water. Remove old dressing, discard into plastic bag and place into trash. Cleanse the wound with Normal Saline prior to applying a clean dressing using gauze sponges, not tissues or cotton balls. Do not scrub or use excessive force. Pat dry using gauze sponges, not tissue or cotton balls. Cleanser: Soap and Water 3 x Per Week/30 Days Discharge Instructions: Gently cleanse wound with antibacterial soap, rinse and pat dry prior to dressing wounds Primary Dressing: Iodosorb 40 (g) 3 x Per Week/30 Days Discharge Instructions: Applied only to two wounds to the midline leg and back of leg. Secondary Dressing: ABD Pad 5x9 (in/in) 3 x Per Week/30 Days Discharge Instructions: Cover with ABD pad Secondary Dressing: Kerlix 4.5 x 4.1 (in/yd) 3 x Per Week/30  Days Discharge Instructions: Apply Kerlix 4.5 x 4.1 (in/yd) as instructed Stansell, Miamarie E. ([id number]) Secured With: Medipore Tape - 89M Medipore H Soft Cloth Surgical Tape, 2x2 (in/yd) 3 x Per Week/30 Days Secured With: Tubigrip Size D, 3x10 (in/yd) 3 x Per Week/30 Days Discharge Instructions: Apply 3 Tubigrip D 3-finger-widths below knee to base of toes to secure dressing and/or for swelling. Wound #4 - Lower Leg Wound Laterality: Right Cleanser: Normal Saline 3 x Per Week/30 Days Discharge Instructions: Wash your hands with soap and water. Remove old dressing, discard into plastic bag and place into trash. Cleanse the wound with Normal Saline prior to applying a clean dressing using gauze sponges, not tissues or cotton balls. Do not scrub or use excessive force. Pat dry using gauze sponges, not tissue or cotton balls. Cleanser: Soap and Water 3 x Per Week/30 Days Discharge Instructions: Gently cleanse wound with antibacterial soap, rinse and pat dry prior to dressing wounds Primary Dressing: Iodosorb 40 (g) 3 x Per Week/30 Days Discharge Instructions: Applied only to two wounds to the midline leg and back of leg. Secondary Dressing: ABD Pad 5x9 (in/in) 3 x Per Week/30 Days Discharge Instructions: Cover with ABD pad Secondary Dressing: Kerlix 4.5 x 4.1 (in/yd) 3 x Per Week/30 Days Discharge Instructions: Apply Kerlix 4.5 x 4.1 (in/yd) as instructed Secured With: Medipore Tape - 89M Medipore H Soft Cloth Surgical Tape, 2x2 (in/yd) 3 x Per Week/30 Days Secured With: Tubigrip Size D, 3x10 (in/yd) 3 x Per Week/30 Days Discharge Instructions: Apply 3 Tubigrip D 3-finger-widths below knee to base of toes to  secure dressing and/or for swelling. Wound #5 - Lower Leg Wound Laterality: Left, Posterior Cleanser: Normal Saline 1 x Per Week/30 Days Discharge Instructions: Wash your hands with soap and water. Remove old dressing, discard into plastic bag and place into trash. Cleanse the wound with Normal  Saline prior to applying a clean dressing using gauze sponges, not tissues or cotton balls. Do not scrub or use excessive force. Pat dry using gauze sponges, not tissue or cotton balls. Cleanser: Soap and Water 1 x Per Week/30 Days Discharge Instructions: Gently cleanse wound with antibacterial soap, rinse and pat dry prior to dressing wounds Primary Dressing: Iodosorb 40 (g) 1 x Per Week/30 Days Discharge Instructions: Applied only to two wounds to the midline leg and back of leg. Secondary Dressing: ABD Pad 5x9 (in/in) 1 x Per Week/30 Days Discharge Instructions: Cover with ABD pad Secured With: Tubigrip Size D, 3x10 (in/yd) 1 x Per Week/30 Days Discharge Instructions: Apply 3 Tubigrip D 3-finger-widths below knee to base of toes to secure dressing and/or for swelling. Electronic Signature(s) Signed: 06/27/2022 4:30:05 PM By: Levora Dredge Signed: 06/27/2022 5:04:18 PM By: Worthy Keeler PA-C Entered By: Levora Dredge on 06/27/2022 15:55:36 Shugrue, Loura Pardon ([id number]) -------------------------------------------------------------------------------- Problem List Details Patient Name: Kari Medina Date of Service: 06/27/2022 2:30 PM Medical Record Number: [id number] Patient Account Number: [id number] Date of Birth/Sex: 09/06/1946 (75 y.o. F) Treating RN: Levora Dredge Primary Care Provider: [name] Other Clinician: Referring Provider: [name] Treating Provider/Extender: Skipper Cliche in Treatment: 13 Active Problems ICD-10 Encounter Code Description Active Date MDM Diagnosis E11.622 Type 2 diabetes mellitus with other skin ulcer 03/27/2022 No Yes L97.812 Non-pressure chronic ulcer of other part of right lower leg with fat layer 03/27/2022 No Yes exposed L97.822 Non-pressure chronic ulcer of other part of left lower leg with fat layer 03/27/2022 No Yes exposed I27.29 Other secondary pulmonary hypertension 03/27/2022 No Yes I73.00 Raynaud's syndrome without gangrene  03/27/2022 No Yes I10 Essential (primary) hypertension 03/27/2022 No Yes Inactive Problems Resolved Problems Electronic Signature(s) Signed: 06/27/2022 3:00:41 PM By: Worthy Keeler PA-C Entered By: Worthy Keeler on 06/27/2022 15:00:41 Roehrs, Loura Pardon ([id number]) -------------------------------------------------------------------------------- Progress Note Details Patient Name: Kari Medina Date of Service: 06/27/2022 2:30 PM Medical Record Number: [id number] Patient Account Number: [id number] Date of Birth/Sex: 24-Oct-1946 (75 y.o. F) Treating RN: Levora Dredge Primary Care Provider: [name] Other Clinician: Referring Provider: [name] Treating Provider/Extender: Skipper Cliche in Treatment: 13 Subjective Chief Complaint Information obtained from Patient Bilateral LE Ulcers History of Present Illness (HPI) 03-27-2022 upon evaluation today patient presents for initial evaluation here in the clinic concerning issues she has been having with her wounds on her legs. Subsequently she is telling me that they burn and do hurt quite a bit. Unfortunately she is not really in the state of mind she tells me to allow for any sharp debridement for that reason were probably can have to try something different from the standpoint of topical medications and under wrap in order to try to get this under control. Fortunately I do believe this is good to be something we can likely heal but again it absolutely has to involve getting the swelling down if working to see any signs of improvement. The patient in particular tells me this started December 2022 while she was in the hospital. She does go to [facility] in March they sent her here. Currently she has been using Silvadene cream which is really not helping. She did  have some falls not too far back where she did have some broken ribs with bruising. The patient does have a history of diabetes mellitus type 2 with a hemoglobin A1c  of 7.23 October 2021 most recent. She also has a history of pulmonary hypertension, Raynaud's syndrome without gangrene, and hypertension in general. 05-01-2022 upon evaluation today patient appears to be doing okay in regard to her wounds. She has been tolerating the Iodoflex without significant complication. With that being said she does tell me that it burns somewhat but she is able to tolerate this they have gotten some ice packs that she uses which seems to soothe things to some degree. This is good news. With that being said I do not see any evidence of active infection locally or systemically at this time. She does note that it is really can be hard for her to come back and be seen frequently she request at least a 1 month check in between each time and to let home health manage things in between. 05-29-2022 upon evaluation today patient appears to be doing well currently in regard to her wound she is definitely showing signs of improvement which is great news. Fortunately I do not see any evidence of active infection locally or systemically which is also great news. No fevers, chills, nausea, vomiting, or diarrhea. 06-27-2022 upon evaluation today patient's wounds actually are doing about the same she still has a lot of leaking from these areas. With that being said I do not believe that this is just a venous issue I believe honestly she may be in a congestive heart failure exacerbation to be peripherally honest. I discussed with her and her husband today that I really do believe she probably needs to go to the hospital. She was very reluctant to do so which I can completely understand. Nonetheless in the end as I discussed this further I ended up calling her primary care provider's office as well and they essentially ended up with the same recommendation based on her oxygen saturations. Objective Constitutional Well-nourished and well-hydrated in no acute distress. Vitals Time Taken: 2:52 PM,  Pulse: 61 bpm, Respiratory Rate: 18 breaths/min, Blood Pressure: 113/76 mmHg, Pulse Oximetry: 86 %. General Notes: PA Stone made aware of decreased saturations, patient placed on one of our oxygen tanks and patient states feeling better but is tired today. PA Stone made aware unable to obtain temperature. After a few minutes on our tank at 3L O2 patients saturations are at 93% Respiratory normal breathing without difficulty. Psychiatric this patient is able to make decisions and demonstrates good insight into disease process. Alert and Oriented x 3. pleasant and cooperative. General Notes: Upon inspection patient's wound bed actually showed signs of good granulation and epithelization at this point. Fortunately I do not see any evidence of infection locally or systemically and I am pleased in that regard we are using Iodosorb at this point. With that being said my biggest concern based on what I am seeing is simply that the patient is going to require I feel like a stronger diuresis than what she is getting with the Lasix at home. I really feel like she may need to go and to the hospital due to the fact that her oxygen saturations are extremely low. She was upon arrival today at around 80-82. Once we got her on the 3 L of oxygen we can get her up to around 90. With that being said by the time that she started talking to  tell me what was going on with her dressings just that little bit of talking would cause her to drop back down to [place] ([id number]) around 80. This went on the majority of the visit as we were discussing things today. In the end I ended up going ahead forward with a recommendation that she go to the ER for further evaluation and we did call the triage nurse there as well. Integumentary (Hair, Skin) Wound #3 status is Open. Original cause of wound was Gradually Appeared. The date acquired was: 12/05/2021. The wound has been in treatment 13 weeks. The wound is located on the  Left,Circumferential Lower Leg. The wound measures 5cm length x 8cm width x 0.2cm depth; 31.416cm^2 area and 6.283cm^3 volume. There is Fat Layer (Subcutaneous Tissue) exposed. There is no tunneling or undermining noted. There is a medium amount of serosanguineous drainage noted. There is small (1-33%) red, pink granulation within the wound bed. There is a large (67- 100%) amount of necrotic tissue within the wound bed including Adherent Slough. Wound #4 status is Open. Original cause of wound was Gradually Appeared. The date acquired was: 05/01/2022. The wound has been in treatment 8 weeks. The wound is located on the Right Lower Leg. The wound measures 1.8cm length x 2cm width x 0.3cm depth; 2.827cm^2 area and 0.848cm^3 volume. There is Fat Layer (Subcutaneous Tissue) exposed. There is no tunneling or undermining noted. There is a medium amount of serosanguineous drainage noted. There is small (1-33%) red granulation within the wound bed. There is a medium (34-66%) amount of necrotic tissue within the wound bed including Adherent Slough. Wound #5 status is Open. Original cause of wound was Shear/Friction. The date acquired was: 01/23/2022. The wound has been in treatment 4 weeks. The wound is located on the Left,Posterior Lower Leg. The wound measures 1cm length x 1.2cm width x 0.2cm depth; 0.942cm^2 area and 0.188cm^3 volume. There is Fat Layer (Subcutaneous Tissue) exposed. There is no tunneling or undermining noted. There is a medium amount of serosanguineous drainage noted. There is small (1-33%) red granulation within the wound bed. There is a large (67-100%) amount of necrotic tissue within the wound bed including Adherent Slough. Assessment Active Problems ICD-10 Type 2 diabetes mellitus with other skin ulcer Non-pressure chronic ulcer of other part of right lower leg with fat layer exposed Non-pressure chronic ulcer of other part of left lower leg with fat layer exposed Other secondary  pulmonary hypertension Raynaud's syndrome without gangrene Essential (primary) hypertension Plan Follow-up Appointments: Return Appointment in 1 month - per pt request Nurse Visit as needed Home Health: [place]: - [facility] ADMIT to [place] for wound care. May utilize formulary equivalent dressing for wound treatment orders unless otherwise specified. Home Health Nurse may visit PRN to address patient s wound care needs. Scheduled days for dressing changes to be completed; exception, patient has scheduled wound care visit that day. **Please direct any NON-WOUND related issues/requests for orders to patient's Primary Care Physician. **If current dressing causes regression in wound condition, may D/C ordered dressing product/s and apply Normal Saline Moist Dressing daily until next [place] or Other MD appointment. **Notify [facility] of regression in wound condition at [phone]. Bathing/ Shower/ Hygiene: Wash wounds with antibacterial soap and water. May shower with wound dressing protected with water repellent cover or cast protector. No tub bath. Edema Control - Lymphedema / Segmental Compressive Device / Other: Tubigrip single layer applied. - apply to each leg Elevate leg(s)  parallel to the floor when sitting. DO YOUR BEST to sleep in the bed at night. DO NOT sleep in your recliner. Long hours of sitting in a recliner leads to swelling of the legs and/or potential wounds on your backside. Non-Wound Condition: Additional non-wound orders/instructions: - Eucerin or cedafil lotion for legs to help with comfort and itching. WOUND #3: - Lower Leg Wound Laterality: Left, Circumferential Cleanser: Normal Saline 3 x Per Week/30 Days Discharge Instructions: Wash your hands with soap and water. Remove old dressing, discard into plastic bag and place into trash. Cleanse the wound with Normal Saline prior to applying a clean dressing using gauze sponges,  not tissues or cotton balls. Do not scrub or use excessive force. Pat dry using gauze sponges, not tissue or cotton balls. Cleanser: Soap and Water 3 x Per Week/30 Days Discharge Instructions: Gently cleanse wound with antibacterial soap, rinse and pat dry prior to dressing wounds Primary Dressing: Iodosorb 40 (g) 3 x Per Week/30 Days Discharge Instructions: Applied only to two wounds to the midline leg and back of leg. Secondary Dressing: ABD Pad 5x9 (in/in) 3 x Per Week/30 Days Discharge Instructions: Cover with ABD pad Vaughn, Dakiya E. ([id number]) Secondary Dressing: Kerlix 4.5 x 4.1 (in/yd) 3 x Per Week/30 Days Discharge Instructions: Apply Kerlix 4.5 x 4.1 (in/yd) as instructed Secured With: Medipore Tape - 44M Medipore H Soft Cloth Surgical Tape, 2x2 (in/yd) 3 x Per Week/30 Days Secured With: Tubigrip Size D, 3x10 (in/yd) 3 x Per Week/30 Days Discharge Instructions: Apply 3 Tubigrip D 3-finger-widths below knee to base of toes to secure dressing and/or for swelling. WOUND #4: - Lower Leg Wound Laterality: Right Cleanser: Normal Saline 3 x Per Week/30 Days Discharge Instructions: Wash your hands with soap and water. Remove old dressing, discard into plastic bag and place into trash. Cleanse the wound with Normal Saline prior to applying a clean dressing using gauze sponges, not tissues or cotton balls. Do not scrub or use excessive force. Pat dry using gauze sponges, not tissue or cotton balls. Cleanser: Soap and Water 3 x Per Week/30 Days Discharge Instructions: Gently cleanse wound with antibacterial soap, rinse and pat dry prior to dressing wounds Primary Dressing: Iodosorb 40 (g) 3 x Per Week/30 Days Discharge Instructions: Applied only to two wounds to the midline leg and back of leg. Secondary Dressing: ABD Pad 5x9 (in/in) 3 x Per Week/30 Days Discharge Instructions: Cover with ABD pad Secondary Dressing: Kerlix 4.5 x 4.1 (in/yd) 3 x Per Week/30 Days Discharge Instructions: Apply  Kerlix 4.5 x 4.1 (in/yd) as instructed Secured With: Medipore Tape - 44M Medipore H Soft Cloth Surgical Tape, 2x2 (in/yd) 3 x Per Week/30 Days Secured With: Tubigrip Size D, 3x10 (in/yd) 3 x Per Week/30 Days Discharge Instructions: Apply 3 Tubigrip D 3-finger-widths below knee to base of toes to secure dressing and/or for swelling. WOUND #5: - Lower Leg Wound Laterality: Left, Posterior Cleanser: Normal Saline 1 x Per Week/30 Days Discharge Instructions: Wash your hands with soap and water. Remove old dressing, discard into plastic bag and place into trash. Cleanse the wound with Normal Saline prior to applying a clean dressing using gauze sponges, not tissues or cotton balls. Do not scrub or use excessive force. Pat dry using gauze sponges, not tissue or cotton balls. Cleanser: Soap and Water 1 x Per Week/30 Days Discharge Instructions: Gently cleanse wound with antibacterial soap, rinse and pat dry prior to dressing wounds Primary Dressing: Iodosorb 40 (g) 1 x Per Week/30 Days Discharge  Instructions: Applied only to two wounds to the midline leg and back of leg. Secondary Dressing: ABD Pad 5x9 (in/in) 1 x Per Week/30 Days Discharge Instructions: Cover with ABD pad Secured With: Tubigrip Size D, 3x10 (in/yd) 1 x Per Week/30 Days Discharge Instructions: Apply 3 Tubigrip D 3-finger-widths below knee to base of toes to secure dressing and/or for swelling. 1. I did ended up calling the patient's primary care provider, Dr. Silvio Pate. Subsequently he was out of the office the spoke with the PA who was in the office, Matt, he recommended based on what was going on in the patient's oxygen saturation that she should go to the ER for further evaluation and treatment. They stated that she really could not be seen in office with saturations that low they would not recommend this. Nonetheless in the end I did go ahead and discuss this with the patient further she is going to the ER for further evaluation and  treatment. 2. We will get a continue with the Iodosorb for the time being. 3. I am can recommend continuation of the ABD pad and Tubigrip for the time being. We will see patient back for reevaluation in 1 week here in the clinic. If anything worsens or changes patient will contact our office for additional recommendations. Electronic Signature(s) Signed: 06/27/2022 5:00:36 PM By: Worthy Keeler PA-C Entered By: Worthy Keeler on 06/27/2022 17:00:36 Rolon, Loura Pardon ([id number]) -------------------------------------------------------------------------------- SuperBill Details Patient Name: Kari Medina Date of Service: 06/27/2022 Medical Record Number: [id number] Patient Account Number: [id number] Date of Birth/Sex: 1946-02-04 (76 y.o. F) Treating RN: Levora Dredge Primary Care Provider: [name] Other Clinician: Referring Provider: [name] Treating Provider/Extender: Skipper Cliche in Treatment: 13 Diagnosis Coding ICD-10 Codes Code Description E11.622 Type 2 diabetes mellitus with other skin ulcer L97.812 Non-pressure chronic ulcer of other part of right lower leg with fat layer exposed L97.822 Non-pressure chronic ulcer of other part of left lower leg with fat layer exposed I27.29 Other secondary pulmonary hypertension I73.00 Raynaud's syndrome without gangrene I10 Essential (primary) hypertension Facility Procedures CPT4 Code: [id number] Description: 99214 - WOUND CARE VISIT-LEV 4 EST PT Modifier: Quantity: 1 Physician Procedures CPT4 Code: 8182993 Description: 71696 - WC PHYS LEVEL 5 - EST PT Modifier: Quantity: 1 CPT4 Code: Description: ICD-10 Diagnosis Description E11.622 Type 2 diabetes mellitus with other skin ulcer L97.812 Non-pressure chronic ulcer of other part of right lower leg with fat la L97.822 Non-pressure chronic ulcer of other part of left lower leg with fat  lay I27.29 Other secondary pulmonary hypertension Modifier: yer exposed er  exposed Quantity: Electronic Signature(s) Signed: 06/27/2022 5:00:56 PM By: Worthy Keeler PA-C Previous Signature: 06/27/2022 4:00:44 PM Version By: Levora Dredge Entered By: Worthy Keeler on 06/27/2022 17:00:56

## 2022-06-27 NOTE — Assessment & Plan Note (Signed)
BP currently controlled.  Continue propranolol and spironolactone

## 2022-06-27 NOTE — Assessment & Plan Note (Signed)
Continue CPAP nightly. °

## 2022-06-27 NOTE — Assessment & Plan Note (Addendum)
Last echo on record from December 2022 showing EF 75% with indeterminate diastolic dysfunction however review of pulmonary notes showing grade 1 DD Chest x-ray showed airspace disease left lower lobe, possible pneumonia, cardiomegaly and enlarged central pulmonary arteries consistent with arterial hypertension We will repeat echo BNP 1906 Continue Lasix with monitoring of kidney function.  Continue propranolol and spironolactone Daily weights with intake and output monitoring We will get cardiology to assist with medication optimization and management.  Patient has seen [facility] cardiologist, Dr. Corky Sox in the past

## 2022-06-28 ENCOUNTER — Inpatient Hospital Stay
Admit: 2022-06-28 | Discharge: 2022-06-28 | Disposition: A | Discharge status: Home / Self Care | Payer: Medicare Other | Attending: Internal Medicine | Admitting: Internal Medicine

## 2022-06-28 DIAGNOSIS — J9621 Acute and chronic respiratory failure with hypoxia: Secondary | ICD-10-CM | POA: Diagnosis not present

## 2022-06-28 LAB — ECHOCARDIOGRAM COMPLETE
AV Peak grad: 3.6 mmHg
Ao pk vel: 0.95 m/s
Area-P 1/2: 3.4 cm2
Height: 66 in
S' Lateral: 3.72 cm
Weight: 3403.9 oz

## 2022-06-28 LAB — BASIC METABOLIC PANEL
Anion gap: 9 (ref 5–15)
BUN: 26 mg/dL — ABNORMAL HIGH (ref 8–23)
CO2: 26 mmol/L (ref 22–32)
Calcium: 9 mg/dL (ref 8.9–10.3)
Chloride: 104 mmol/L (ref 98–111)
Creatinine, Ser: 1.32 mg/dL — ABNORMAL HIGH (ref 0.44–1.00)
GFR, Estimated: 43 mL/min — ABNORMAL LOW (ref >60)
Glucose, Bld: 127 mg/dL — ABNORMAL HIGH (ref 70–99)
Potassium: 3.6 mmol/L (ref 3.5–5.1)
Sodium: 139 mmol/L (ref 135–145)

## 2022-06-28 LAB — HEMOGLOBIN A1C
Hgb A1c MFr Bld: 6.7 % — ABNORMAL HIGH (ref 4.8–5.6)
Mean Plasma Glucose: 145.59 mg/dL

## 2022-06-28 LAB — GLUCOSE, CAPILLARY
Glucose-Capillary: 130 mg/dL — ABNORMAL HIGH (ref 70–99)
Glucose-Capillary: 238 mg/dL — ABNORMAL HIGH (ref 70–99)
Glucose-Capillary: 67 mg/dL — ABNORMAL LOW (ref 70–99)
Glucose-Capillary: 73 mg/dL (ref 70–99)
Glucose-Capillary: 127 mg/dL — ABNORMAL HIGH (ref 70–99)
Glucose-Capillary: 139 mg/dL — ABNORMAL HIGH (ref 70–99)

## 2022-06-28 MED ORDER — INSULIN ASPART 100 UNIT/ML IJ SOLN: 0.0000 [IU] | Freq: Every day | INTRAMUSCULAR | Status: DC

## 2022-06-28 MED ORDER — ASPIRIN 81 MG PO TBEC
81.0000 mg | DELAYED_RELEASE_TABLET | Freq: Every day | ORAL | Status: DC
  Administered 2022-06-28 – 2022-06-29 (×2): 81 mg via ORAL
  Filled 2022-06-28 (×2): qty 1

## 2022-06-28 MED ORDER — MONTELUKAST SODIUM 10 MG PO TABS
10.0000 mg | ORAL_TABLET | Freq: Every day | ORAL | Status: DC
  Administered 2022-06-28: 10 mg via ORAL
  Filled 2022-06-28: qty 1

## 2022-06-28 MED ORDER — DULOXETINE HCL 30 MG PO CPEP
60.0000 mg | ORAL_CAPSULE | Freq: Every day | ORAL | Status: DC
  Administered 2022-06-28 – 2022-06-29 (×2): 60 mg via ORAL
  Filled 2022-06-28 (×2): qty 2

## 2022-06-28 MED ORDER — INSULIN ASPART 100 UNIT/ML IJ SOLN
0.0000 [IU] | Freq: Three times a day (TID) | INTRAMUSCULAR | Status: DC
  Administered 2022-06-28: 5 [IU] via SUBCUTANEOUS
  Administered 2022-06-29: 3 [IU] via SUBCUTANEOUS
  Administered 2022-06-29: 2 [IU] via SUBCUTANEOUS
  Filled 2022-06-28 (×3): qty 1

## 2022-06-28 MED ORDER — FUROSEMIDE 10 MG/ML IJ SOLN
40.0000 mg | Freq: Two times a day (BID) | INTRAMUSCULAR | Status: DC
  Administered 2022-06-28 – 2022-06-29 (×3): 40 mg via INTRAVENOUS
  Filled 2022-06-28 (×3): qty 4

## 2022-06-28 NOTE — Progress Notes (Signed)
*  PRELIMINARY RESULTS* Echocardiogram 2D Echocardiogram has been performed.  Kari Medina 06/28/2022, 10:16 AM

## 2022-06-28 NOTE — Consult Note (Signed)
SURGICAL CONSULTATION NOTE   HISTORY OF PRESENT ILLNESS (HPI):  76 y.o. female presented to [facility] ED for evaluation of shortness of breath and hypoxemia. Patient reports she went to the wound care clinic and she was found with low O2 saturation.  She was sent from the wound care clinic to the hospital for evaluation of low O2 sat.  Patient endorses that she has been going chronically to the wound care clinic for nonhealing ulcers on both lower extremities.  She denies any issues with the local care.  Local care given by wound care clinic and her husband.  Patient endorses chronic lower extremity pain.  Generalized lower extremity pain.  Aggravating factor is applying pressure.  No alleviating factors.  Patient denies any fever or chills.  She was admitted by hospitalist for evaluation and management of acute on chronic diastolic CHF.  Surgery is consulted by Dr. Damita Dunnings in this context for evaluation and management of lower extremity ulcers.  PAST MEDICAL HISTORY (PMH):  Past Medical History:  Diagnosis Date   Asthma    Chronic hypoxemic respiratory failure (HCC)    uses O2 with exertion and with CPAP   COPD (chronic obstructive pulmonary disease) (HCC)    Depression    Diabetes mellitus without complication (HCC)    Dyspnea    doe   Dysrhythmia    extra beat   Fatty liver    Fibromyalgia    Generalized osteoarthritis of multiple sites    History of hiatal hernia    Hypertension    Hypothyroidism    Melanoma ([place]) 07/2018   Right leg   OSA (obstructive sleep apnea)    Pain    chronic ruq and back pain   Panic attacks    PONV (postoperative nausea and vomiting)    after thyroidectomy   Raynaud disease    RLS (restless legs syndrome)    Spleen absent    TOLD ABSENT THEN TOLD DOES HAVE SPLEEN. PATIENT IS UNCERTAIN   Tremor, essential    Wears dentures    full upper and lower     PAST SURGICAL HISTORY ([place]):  Past Surgical History:  Procedure Laterality Date   CATARACT  EXTRACTION W/PHACO Right 03/04/2021   Procedure: CATARACT EXTRACTION PHACO AND INTRAOCULAR LENS PLACEMENT ([facility]) RIGHT DIABETIC 3.98 00:33.2;  Surgeon: Eulogio Bear, MD;  Location: [place];  Service: Ophthalmology;  Laterality: Right;  Diabetic - oral meds   CATARACT EXTRACTION W/PHACO Left 03/18/2021   Procedure: CATARACT EXTRACTION PHACO AND INTRAOCULAR LENS PLACEMENT ([place]) LEFT;  Surgeon: Eulogio Bear, MD;  Location: [place];  Service: Ophthalmology;  Laterality: Left;  2.96 0:27.9   CHOLECYSTECTOMY     DILATATION & CURETTAGE/HYSTEROSCOPY WITH MYOSURE N/A 11/10/2018   Procedure: DILATATION & CURETTAGE/HYSTEROSCOPY WITH MYOSURE/MYOMECTOMY;  Surgeon: Aletha Halim, MD;  Location: [place];  Service: Gynecology;  Laterality: N/A;  possible myosure.  Please use myosure scope, do not open myosure blades but have in the room   JOINT REPLACEMENT Bilateral    2008/2011   MELANOMA EXCISION  07/2018   REVERSE SHOULDER ARTHROPLASTY Right 05/28/2017   Procedure: REVERSE SHOULDER ARTHROPLASTY;  Surgeon: Corky Mull, MD;  Location: [facility];  Service: Orthopedics;  Laterality: Right;   RHINOPLASTY  1972   RIGHT HEART CATH N/A 12/27/2021   Procedure: RIGHT HEART CATH;  Surgeon: Andrez Grime, MD;  Location: [place] [facility];  Service: Cardiovascular;  Laterality: N/A;   THYROIDECTOMY  2006   TOTAL  KNEE ARTHROPLASTY     bilateral     MEDICATIONS:  Prior to Admission medications   Medication Sig Start Date End Date Taking? Authorizing Provider  acetaminophen (TYLENOL) 650 MG CR tablet Take 650 mg by mouth every 8 (eight) hours as needed for pain.   Yes [provider]  albuterol (PROVENTIL HFA) 108 (90 Base) MCG/ACT inhaler Inhale 2 puffs into the lungs every 6 (six) hours as needed for wheezing or shortness of breath. 01/06/20  Yes [name] I, MD  aspirin EC 81 MG tablet Take 81 mg by mouth 2 (two) times daily.   Yes [provider]  budesonide-formoterol (SYMBICORT) 160-4.5 MCG/ACT inhaler Inhale 2 puffs into the lungs 2 (two) times daily. 03/05/22  Yes [name] I, MD  DULoxetine (CYMBALTA) 60 MG capsule TAKE 1 CAPSULE BY MOUTH  DAILY 12/09/21  Yes Venia Carbon, MD  furosemide (LASIX) 40 MG tablet TAKE ONE TABLET BY MOUTH DAILY 05/16/22  Yes [name] I, MD  ipratropium-albuterol (DUONEB) 0.5-2.5 (3) MG/3ML SOLN Inhale 3 mLs into the lungs 4 (four) times daily as needed. 01/08/22 01/03/23 Yes [provider]  ketoconazole (NIZORAL) 2 % cream Apply 1 application  topically daily. 06/02/22  Yes Venia Carbon, MD  levothyroxine (SYNTHROID) 200 MCG tablet Take 1 tablet (200 mcg total) by mouth daily before breakfast. 05/23/22  Yes [name] I, MD  metFORMIN (GLUCOPHAGE-XR) 500 MG 24 hr tablet TAKE 1 TABLET BY MOUTH  DAILY WITH BREAKFAST 12/09/21  Yes [name] I, MD  montelukast (SINGULAIR) 10 MG tablet TAKE 1 TABLET BY MOUTH AT  BEDTIME 12/09/21  Yes Venia Carbon, MD  propranolol (INDERAL) 40 MG tablet TAKE 1 TABLET BY MOUTH  TWICE DAILY 04/25/22  Yes Venia Carbon, MD  rOPINIRole (REQUIP) 3 MG tablet Take 1 tablet (3 mg total) by mouth at bedtime. 03/13/22  Yes Venia Carbon, MD  spironolactone (ALDACTONE) 25 MG tablet TAKE ONE TABLET BY MOUTH DAILY 05/16/22  Yes Venia Carbon, MD  sulfamethoxazole-trimethoprim (BACTRIM DS) 800-160 MG tablet Take 1 tablet by mouth 2 (two) times daily. For 3 days---for urine symptoms 06/02/22  Yes [name] I, MD  aspirin 325 MG EC tablet Take 162.5 mg by mouth daily. Patient not taking: Reported on 06/27/2022    [provider]  calcium carbonate (OS-CAL) 600 MG TABS Take 600 mg by mouth daily with breakfast. Patient not taking: Reported on 06/27/2022    [provider]  fluconazole (DIFLUCAN) 150 MG tablet Take 1 tablet (150 mg total) by mouth once a week. As needed Patient not taking: Reported on 06/27/2022  06/02/22   [name] I, MD  glucose blood [facility] VERIO) test strip Use to check blood sugar once a day. E11.9 02/21/22   Venia Carbon, MD  [facility] DELICA LANCETS FINE MISC 1 each by Does not apply route daily. Use to check blood sugar once a day. E11.9 09/30/18   Venia Carbon, MD     ALLERGIES:  Allergies  Allergen Reactions   Latex Hives    Tape and bandaids only   Nickel Dermatitis   Pramipexole Other (See Comments)    Caused hallucinations, says she can take name brand.   Prednisone     Makes her "feel crazy" - can take low dosage    Topiramate Other (See Comments)    "spaced out"   Citalopram Anxiety   Paroxetine Hcl Anxiety   Sertraline Anxiety  SOCIAL HISTORY:  Social History   Socioeconomic History   Marital status: Married    Spouse name: Not on file   Number of children: 1   Years of education: Not on file   Highest education level: Not on file  Occupational History   Occupation: Neurosurgeon    Comment: Retired  Tobacco Use   Smoking status: Former    Packs/day: 0.25    Years: 5.00    Total pack years: 1.25    Types: Cigarettes    Quit date: 12/22/1988    Years since quitting: 33.5   Smokeless tobacco: Never  Vaping Use   Vaping Use: Never used  Substance and Sexual Activity   Alcohol use: No   Drug use: No   Sexual activity: Not on file  Other Topics Concern   Not on file  Social History Narrative   1 daughter      Has living will   Husband has health care POA. Alternate would be daughter Judson Roch   Would allow resuscitation but no prolonged machines   Not sure about feeding tubes   Social Determinants of Health   Financial Resource Strain: Not on file  Food Insecurity: Not on file  Transportation Needs: Not on file  Physical Activity: Not on file  Stress: Not on file  Social Connections: Not on file  Intimate Partner Violence: Not on file      FAMILY HISTORY:  Family History  Problem Relation Age of Onset    Osteoarthritis Mother    Diabetes Mother    Cirrhosis Mother    Cancer Father    Kidney cancer Father    Bladder Cancer Father    Heart disease Brother        stents in 1 brother   Breast cancer Neg Hx      REVIEW OF SYSTEMS:  Constitutional: denies weight loss, fever, chills, or sweats  Eyes: denies any other vision changes, history of eye injury  ENT: denies sore throat, hearing problems  Respiratory: Positive shortness of breath, wheezing  Cardiovascular: denies chest pain, palpitations  Gastrointestinal: Denies abdominal pain, nausea and vomiting Genitourinary: denies burning with urination or urinary frequency Musculoskeletal: denies any other joint pains or cramps  Skin: denies any other rashes or skin discolorations  Neurological: denies any other headache, dizziness, weakness  Psychiatric: denies any other depression, anxiety   All other review of systems were negative   VITAL SIGNS:  Temp:  [97.6 F (36.4 C)-98.4 F (36.9 C)] 98.4 F (36.9 C) (07/08 0737) Pulse Rate:  [59-75] 62 (07/08 0737) Resp:  [17-20] 18 (07/08 0737) BP: (93-128)/(54-110) 99/62 (07/08 0737) SpO2:  [86 %-93 %] 93 % (07/08 0737) Weight:  [95.2 kg-97 kg] 96.5 kg (07/08 0319)     Height: '5\' 6"'$  (167.6 cm) Weight: 96.5 kg BMI (Calculated): 34.35   INTAKE/OUTPUT:  This shift: No intake/output data recorded.  Last 2 shifts: '@IOLAST2SHIFTS'$ @   PHYSICAL EXAM:  Constitutional:  -- Normal body habitus  -- Awake, alert, and oriented x3  Eyes:  -- Pupils equally round and reactive to light  -- No scleral icterus  Ear, nose, and throat:  -- No jugular venous distension  Pulmonary:  -- No crackles  -- Equal breath sounds bilaterally -- Breathing non-labored at rest Cardiovascular:  -- S1, S2 present  -- No pericardial rubs Gastrointestinal:  -- Abdomen soft, nontender, non-distended, no guarding or rebound tenderness -- No abdominal masses appreciated, pulsatile or otherwise  Musculoskeletal  and Integumentary:  --  Wounds: Multiple small bilateral lower extremity ulcers.  No necrotic tissue.  No purulence.  Erythema from swelling.  No blanching erythema. -- Extremities: B/L UE and LE FROM, hands and feet warm, with pitting edema  Neurologic:  -- Motor function: intact and symmetric -- Sensation: intact and symmetric  Right lower extremity:    Left medial side lower extremity:   Left anterior lower extremity:     Labs:     Latest Ref Rng & Units 06/27/2022    4:43 PM 11/20/2021    1:03 AM 11/19/2021    2:32 PM  CBC  WBC 4.0 - 10.5 K/uL 7.4  12.1  11.9   Hemoglobin 12.0 - 15.0 g/dL 16.3  15.4  16.1   Hematocrit 36.0 - 46.0 % 49.1  46.8  48.0   Platelets 150 - 400 K/uL 145  162  172       Latest Ref Rng & Units 06/28/2022    7:08 AM 06/27/2022    4:43 PM 11/26/2021    4:08 AM  CMP  Glucose 70 - 99 mg/dL 127  94  105   BUN 8 - 23 mg/dL '26  24  26   '$ Creatinine 0.44 - 1.00 mg/dL 1.32  1.25  1.02   Sodium 135 - 145 mmol/L 139  140  137   Potassium 3.5 - 5.1 mmol/L 3.6  4.2  3.7   Chloride 98 - 111 mmol/L 104  105  100   CO2 22 - 32 mmol/L '26  26  28   '$ Calcium 8.9 - 10.3 mg/dL 9.0  9.1  8.7      Imaging studies:  Chest CT and x-ray shows cardiomegaly.  No significant effusions.  I personally evaluated the images.  Assessment/Plan:  76 y.o. female with chronic lower extremity ulcers, complicated by pertinent comorbidities including acute on chronic CHF.  Patient with chronic lower extremity ulcers.  She has been getting care at the wound care clinic.  I was consulted for evaluation of possible debridement.  Upon physical exam the ulcers are getting adequate care.  No need of debridement.  No necrotic tissue.  No purulence.  No sign of infection.  I personally did local care today.  Patient need to continue optimize from the medical standpoint to decrease swelling of the lower extremities to be able to heal the wounds.  If she is can have a prolonged stay in the  hospital I recommend to consult wound care for further wound care recommendations.  If CT scan to reassure stay the wounds can be treated with wet-to-dry dressings.  She understand that this is good to be a long process.  No surgical intervention needed at this moment.  Surgery will sign off.  This consult encounter was for 65 minutes most of the time evaluation of the patient, evaluation of previous wound care evaluation, evaluation of labs and imaging.  Personally doing local care to the wounds.  And coordinating plan of care.  Arnold Long, MD

## 2022-06-28 NOTE — Hospital Course (Signed)
76 year old female with past medical history of COPD, chronic respiratory failure on 3 LPM, type II DM, OSA on CPAP, chronic lower extremity edema, chronic leg wound, TAA, morbid obesity, pulmonary HTN.  Presents to the hospital with complaints of progressively worsening shortness of breath as well as swelling in the leg.  Found to have acute on chronic right-sided heart failure in the setting of pulmonary hypertension.  Currently being treated with IV diuresis.

## 2022-06-28 NOTE — Consult Note (Signed)
[facility] Cardiology  CARDIOLOGY CONSULT NOTE  Patient ID: Kari Medina MRN: [id number] DOB/AGE: [birth year] 76 y.o.  Admit date: 06/27/2022 Referring Physician Berle Mull Primary Physician Venia Carbon, MD Primary Cardiologist Andrez Grime, MD; Rich Reining Reason for Consultation Volume overload, severe PH  HPI:   Kari Medina is a 76 year old female with a history of severe pulmonary hypertension on chronic oxygen therapy, thoracic aortic aneurysm (4.5 cm), type 2 diabetes, obstructive sleep apnea who was admitted with volume overload from wound clinic.  She is intermittently been struggling with volume overload and hypoxia for the last 8 months.  Recently she has been dealing with lower extremity wounds that have been managed by wound clinic, and yesterday they felt that she had increased edema making wound healing difficult. She was referred on 06/27/2022 by the wound clinic for diuresis. In talking with the patient she overall has been feeling at her baseline. She does not feel she has been more swollen recently, but yesterday she felt slightly more short of breath than usual. Denies orthopnea or PND.   On arrival to the emergency department she was slightly hypoxic on her baseline 3 L oxygen to 86%, with heart rate in the 60s and blood pressure in her normal range.  Creatinine 1.25 up from a baseline of approximately 1.  BNP 1900 from a recent nadir of approximately 1000.  Review of systems complete and found to be negative unless listed above     Past Medical History:  Diagnosis Date   Asthma    Chronic hypoxemic respiratory failure ([place])    uses O2 with exertion and with CPAP   COPD (chronic obstructive pulmonary disease) (HCC)    Depression    Diabetes mellitus without complication (HCC)    Dyspnea    doe   Dysrhythmia    extra beat   Fatty liver    Fibromyalgia    Generalized osteoarthritis of multiple sites    History of hiatal hernia    Hypertension     Hypothyroidism    Melanoma ([place]) 07/2018   Right leg   OSA (obstructive sleep apnea)    Pain    chronic ruq and back pain   Panic attacks    PONV (postoperative nausea and vomiting)    after thyroidectomy   Raynaud disease    RLS (restless legs syndrome)    Spleen absent    TOLD ABSENT THEN TOLD DOES HAVE SPLEEN. PATIENT IS UNCERTAIN   Tremor, essential    Wears dentures    full upper and lower    Past Surgical History:  Procedure Laterality Date   CATARACT EXTRACTION W/PHACO Right 03/04/2021   Procedure: CATARACT EXTRACTION PHACO AND INTRAOCULAR LENS PLACEMENT (IOC) RIGHT DIABETIC 3.98 00:33.2;  Surgeon: Eulogio Bear, MD;  Location: [place];  Service: Ophthalmology;  Laterality: Right;  Diabetic - oral meds   CATARACT EXTRACTION W/PHACO Left 03/18/2021   Procedure: CATARACT EXTRACTION PHACO AND INTRAOCULAR LENS PLACEMENT ([place]) LEFT;  Surgeon: Eulogio Bear, MD;  Location: [place];  Service: Ophthalmology;  Laterality: Left;  2.96 0:27.9   CHOLECYSTECTOMY     DILATATION & CURETTAGE/HYSTEROSCOPY WITH MYOSURE N/A 11/10/2018   Procedure: DILATATION & CURETTAGE/HYSTEROSCOPY WITH MYOSURE/MYOMECTOMY;  Surgeon: Aletha Halim, MD;  Location: [place];  Service: Gynecology;  Laterality: N/A;  possible myosure.  Please use myosure scope, do not open myosure blades but have in the room   JOINT REPLACEMENT Bilateral    2008/2011   MELANOMA  EXCISION  07/2018   REVERSE SHOULDER ARTHROPLASTY Right 05/28/2017   Procedure: REVERSE SHOULDER ARTHROPLASTY;  Surgeon: Corky Mull, MD;  Location: [facility];  Service: Orthopedics;  Laterality: Right;   RHINOPLASTY  1972   RIGHT HEART CATH N/A 12/27/2021   Procedure: RIGHT HEART CATH;  Surgeon: Andrez Grime, MD;  Location: [place] [facility];  Service: Cardiovascular;  Laterality: N/A;   THYROIDECTOMY  2006   TOTAL KNEE ARTHROPLASTY     bilateral    Medications Prior to Admission  Medication  Sig Dispense Refill Last Dose   acetaminophen (TYLENOL) 650 MG CR tablet Take 650 mg by mouth every 8 (eight) hours as needed for pain.   prn at prn   albuterol (PROVENTIL HFA) 108 (90 Base) MCG/ACT inhaler Inhale 2 puffs into the lungs every 6 (six) hours as needed for wheezing or shortness of breath. 18 g 2 prn at prn   aspirin EC 81 MG tablet Take 81 mg by mouth 2 (two) times daily.   06/27/2022 at 0800   budesonide-formoterol (SYMBICORT) 160-4.5 MCG/ACT inhaler Inhale 2 puffs into the lungs 2 (two) times daily. 3 each 1 06/27/2022 at 0800   DULoxetine (CYMBALTA) 60 MG capsule TAKE 1 CAPSULE BY MOUTH  DAILY 90 capsule 3 06/27/2022 at 0800   furosemide (LASIX) 40 MG tablet TAKE ONE TABLET BY MOUTH DAILY 30 tablet 1 06/27/2022 at 0800   ipratropium-albuterol (DUONEB) 0.5-2.5 (3) MG/3ML SOLN Inhale 3 mLs into the lungs 4 (four) times daily as needed.      ketoconazole (NIZORAL) 2 % cream Apply 1 application  topically daily. 45 g 3 06/27/2022 at 0800   levothyroxine (SYNTHROID) 200 MCG tablet Take 1 tablet (200 mcg total) by mouth daily before breakfast. 90 tablet 0 06/27/2022 at 0700   metFORMIN (GLUCOPHAGE-XR) 500 MG 24 hr tablet TAKE 1 TABLET BY MOUTH  DAILY WITH BREAKFAST 90 tablet 3 06/27/2022 at 0800   montelukast (SINGULAIR) 10 MG tablet TAKE 1 TABLET BY MOUTH AT  BEDTIME 90 tablet 3 06/26/2022 at 2000   propranolol (INDERAL) 40 MG tablet TAKE 1 TABLET BY MOUTH  TWICE DAILY 180 tablet 3 06/27/2022 at 0800   rOPINIRole (REQUIP) 3 MG tablet Take 1 tablet (3 mg total) by mouth at bedtime. 90 tablet 3 06/26/2022 at 2000   spironolactone (ALDACTONE) 25 MG tablet TAKE ONE TABLET BY MOUTH DAILY 30 tablet 1 06/27/2022 at 0800   sulfamethoxazole-trimethoprim (BACTRIM DS) 800-160 MG tablet Take 1 tablet by mouth 2 (two) times daily. For 3 days---for urine symptoms 18 tablet 2 Past Month   aspirin 325 MG EC tablet Take 162.5 mg by mouth daily. (Patient not taking: Reported on 06/27/2022)   Not Taking   calcium carbonate  (OS-CAL) 600 MG TABS Take 600 mg by mouth daily with breakfast. (Patient not taking: Reported on 06/27/2022)   Not Taking   fluconazole (DIFLUCAN) 150 MG tablet Take 1 tablet (150 mg total) by mouth once a week. As needed (Patient not taking: Reported on 06/27/2022) 4 tablet 2 Not Taking   glucose blood (ONETOUCH VERIO) test strip Use to check blood sugar once a day. E11.9 100 each 3    ONETOUCH DELICA LANCETS FINE MISC 1 each by Does not apply route daily. Use to check blood sugar once a day. E11.9 100 each 3    Social History   Socioeconomic History   Marital status: Married    Spouse name: Not on file   Number of children: 1  Years of education: Not on file   Highest education level: Not on file  Occupational History   Occupation: Neurosurgeon    Comment: Retired  Tobacco Use   Smoking status: Former    Packs/day: 0.25    Years: 5.00    Total pack years: 1.25    Types: Cigarettes    Quit date: 12/22/1988    Years since quitting: 33.5   Smokeless tobacco: Never  Vaping Use   Vaping Use: Never used  Substance and Sexual Activity   Alcohol use: No   Drug use: No   Sexual activity: Not on file  Other Topics Concern   Not on file  Social History Narrative   1 daughter      Has living will   Husband has health care POA. Alternate would be daughter Judson Roch   Would allow resuscitation but no prolonged machines   Not sure about feeding tubes   Social Determinants of Health   Financial Resource Strain: Not on file  Food Insecurity: Not on file  Transportation Needs: Not on file  Physical Activity: Not on file  Stress: Not on file  Social Connections: Not on file  Intimate Partner Violence: Not on file    Family History  Problem Relation Age of Onset   Osteoarthritis Mother    Diabetes Mother    Cirrhosis Mother    Cancer Father    Kidney cancer Father    Bladder Cancer Father    Heart disease Brother        stents in 1 brother   Breast cancer Neg Hx        Review of systems complete and found to be negative unless listed above      PHYSICAL EXAM  General: Laying in bed. NAD. [place] oxygen in place HEENT:  Normocephalic and atramatic Neck:  No JVD.  Lungs: Clear bilaterally to auscultation and percussion. Heart: HRRR . Normal S1 and S2 without gallops or murmurs.  Abdomen: Bowel sounds are positive, abdomen soft and non-tender  Msk:  Back normal, normal gait. Normal strength and tone for age. Extremities: 2+ LE edema. Wounds wrapped in BL LE.  Neuro: Alert and oriented X 3. Psych:  Good affect, responds appropriately  Labs:   Lab Results  Component Value Date   WBC 7.4 06/27/2022   HGB 16.3 (H) 06/27/2022   HCT 49.1 (H) 06/27/2022   MCV 96.8 06/27/2022   PLT 145 (L) 06/27/2022    Recent Labs  Lab 06/28/22 0708  NA 139  K 3.6  CL 104  CO2 26  BUN 26*  CREATININE 1.32*  CALCIUM 9.0  GLUCOSE 127*   No results found for: "CKTOTAL", "CKMB", "CKMBINDEX", "TROPONINI"  Lab Results  Component Value Date   CHOL 136 10/29/2021   CHOL 162 01/11/2021   CHOL 173 07/06/2020   Lab Results  Component Value Date   HDL 27.90 (L) 10/29/2021   HDL 40.00 01/11/2021   HDL 45.90 07/06/2020   Lab Results  Component Value Date   LDLCALC 78 10/29/2021   LDLCALC 87 01/11/2021   LDLCALC 102 (H) 07/06/2020   Lab Results  Component Value Date   TRIG 151.0 (H) 10/29/2021   TRIG 178.0 (H) 01/11/2021   TRIG 124.0 07/06/2020   Lab Results  Component Value Date   CHOLHDL 5 10/29/2021   CHOLHDL 4 01/11/2021   CHOLHDL 4 07/06/2020   No results found for: "LDLDIRECT"    Radiology: CT Angio Chest PE W and/or  Wo Contrast  Result Date: 06/27/2022 CLINICAL DATA:  Increased work of breathing, shortness of breath, hypoxia EXAM: CT ANGIOGRAPHY CHEST WITH CONTRAST TECHNIQUE: Multidetector CT imaging of the chest was performed using the standard protocol during bolus administration of intravenous contrast. Multiplanar CT image  reconstructions and MIPs were obtained to evaluate the vascular anatomy. RADIATION DOSE REDUCTION: This exam was performed according to the departmental dose-optimization program which includes automated exposure control, adjustment of the mA and/or kV according to patient size and/or use of iterative reconstruction technique. CONTRAST:  35m OMNIPAQUE IOHEXOL 350 MG/ML SOLN COMPARISON:  06/27/2022, 11/19/2021 FINDINGS: Cardiovascular: This is a technically adequate evaluation of the pulmonary vasculature. Slight mixing artifact within the main pulmonary arteries. No filling defects or pulmonary emboli. Dilated main pulmonary artery consistent with pulmonary arterial hypertension. The heart is enlarged without pericardial effusion, with prominent right ventricular dilation again noted. 4.5 cm ascending thoracic aortic aneurysm not significantly changed since prior study. Evaluation of the aortic lumen is limited due to timing of contrast bolus. Stable aortic and coronary artery atherosclerosis. Mediastinum/Nodes: No enlarged mediastinal, hilar, or axillary lymph nodes. Thyroid gland, trachea, and esophagus demonstrate no significant findings. Lungs/Pleura: Scattered hypoventilatory changes are seen bilaterally, greatest in the right middle lobe and lingula. No acute airspace disease, effusion, or pneumothorax. Background emphysema unchanged. Central airways are patent. Upper Abdomen: No acute abnormality. Musculoskeletal: Chronic left posterior ninth through eleventh rib fractures are noted, with moderate callus formation. No acute bony abnormalities. Right shoulder arthroplasty. Reconstructed images demonstrate no additional findings. Review of the MIP images confirms the above findings. IMPRESSION: 1. No evidence of pulmonary embolus. 2. Stable 4.5 cm ascending thoracic aortic aneurysm. Recommend semi-annual imaging followup by CTA or MRA and referral to cardiothoracic surgery if not already obtained. This  recommendation follows 2010 ACCF/AHA/AATS/ACR/ASA/SCA/SCAI/SIR/STS/SVM Guidelines for the Diagnosis and Management of Patients With Thoracic Aortic Disease. Circulation. 2010; 121:: C947-S962 Aortic aneurysm NOS (ICD10-I71.9) 3. Chronic right ventricular dilation and enlargement of the main pulmonary artery, consistent with pulmonary arterial hypertension. 4. Aortic Atherosclerosis (ICD10-I70.0) and Emphysema (ICD10-J43.9). Electronically Signed   By: MRanda NgoM.D.   On: 06/27/2022 18:50   DG Chest 2 View  Result Date: 06/27/2022 CLINICAL DATA:  Shortness of breath EXAM: CHEST - 2 VIEW COMPARISON:  11/21/2021, CT 11/19/2021, chest x-ray 10/23/2018 FINDINGS: Mild cardiomegaly. Enlarged central pulmonary vessels suspicious for arterial hypertension. Airspace disease within the left lung base and posteriorly on lateral view. No pleural effusion or pneumothorax. Hardware in the right shoulder IMPRESSION: 1. Airspace disease in the left lower lobe, possible pneumonia. Imaging follow-up to resolution is recommended 2. Cardiomegaly. Enlarged central pulmonary arteries consistent with arterial hypertension Electronically Signed   By: KDonavan FoilM.D.   On: 06/27/2022 17:23    Echo 02/2022-  NORMAL LEFT VENTRICULAR FUNCTION WITH MILD LVH   SEVERE RV SYSTOLIC DYSFUNCTION (See above)   VALVULAR REGURGITATION: TRIVIAL AR, MILD TR   NO VALVULAR STENOSIS   NORMAL SALINE MICROCAVITATION STUDY   RV SIZE, FUNCTION, AND PRESSURES BETTER VISUALIZED ON TODAY'S EXAM  EKG: Sinus rhythm with a rightward axis.  Nonspecific intraventricular conduction delay.  ASSESSMENT AND PLAN:  JRikki Trosperis a 76year old female with a history of severe pulmonary hypertension on chronic oxygen therapy, thoracic aortic aneurysm (4.5 cm), type 2 diabetes, obstructive sleep apnea who was admitted with volume overload from wound clinic.  # Severe pulmonary HTN # Volume overload Right heart catheterization completed in January 2023  with a mean pulmonary artery pressure of  approximately 55, but an elevated wedge of proximately 40.  Echo shows a severely dilated hypokinetic RV as well.  She is followed by Rich Reining at [facility] in pulmonary hypertension clinic. She appears slightly volume overloaded compared to her baseline with LE edema.  -Lasix 40 mg IV BID today -Continue spironolactone - Echo being completed at the time of my evaluation   Signed: Andrez Grime MD 06/28/2022, 8:21 AM

## 2022-06-28 NOTE — Progress Notes (Addendum)
Progress Note Patient: Estellar Cadena ZYS:[id number] DOB: 12/10/1946 DOA: 06/27/2022  DOS: the patient was seen and examined on 06/28/2022  Brief hospital course: 76 year old female with past medical history of COPD, chronic respiratory failure on 3 LPM, type II DM, OSA on CPAP, chronic lower extremity edema, chronic leg wound, TAA, morbid obesity, pulmonary HTN.  Presents to the hospital with complaints of progressively worsening shortness of breath as well as swelling in the leg.  Found to have acute on chronic right-sided heart failure in the setting of pulmonary hypertension.  Currently being treated with IV diuresis.  Assessment and Plan: Acute on chronic respiratory failure with hypoxia (HCC) Pulmonary hypertension (HCC) Acute on chronic diastolic CHF (congestive heart failure) ([place]) Right heart catheter January 2023 shows mean pulmonary artery pressure of 55, elevated wedge of 40. Echocardiogram shows hypokinetic RV. Referred to [facility] pulmonary hypertension clinic. Appears to have mildly overloaded. Currently receiving IV diuresis with IV Lasix. Continue Aldactone as well. Monitor. Echocardiogram currently pending. [place] cardiology consultation.  Recurrent venous ulcer of lower extremity (HCC) Appears to have nonhealing wounds on both legs for which she is following up with wound care clinic outpatient. At present does not appear to be acutely infected. Appreciate general surgery consultation. Recommendation is to be aggressive with diuresis for volume management and continue wound care. No indication for surgery.  AKI (acute kidney injury) At baseline serum creatinine appears to be around 0.9. On presentation serum creatinine 1.25. Marginally meeting criteria for AKI. Serum creatinine continues to trend up to 1.32. Monitor while the patient is receiving aggressive diuresis.  Hypertension Blood pressure rather soft. We will hold current regimen.  We will continue  Lasix.  OSA (obstructive sleep apnea) Continue CPAP nightly.  COPD (chronic obstructive pulmonary disease) ([place]) Does not appear to have any exacerbation. Continue current therapy  Morbid obesity. Body mass index is 34.34 kg/m.  Placing the patient at high risk of poor outcome. Monitor.  Thoracic ascending aortic aneurysm [facility]) CT angio this admission shows stable 4.5 cm ascending thoracic aortic aneurysm. Semiannual follow-up recommended.  Hypothyroidism Continue Synthroid.  Type 2 diabetes mellitus with diabetic neuropathy, unspecified without long-term insulin use. Appears to be controlled. Check hemoglobin A1c. Currently on sliding scale insulin.  Subjective: Continues to have shortness of breath although improving.  No nausea or fatigue.  Continues to have some pain at the ulcer sites.  Physical Exam: Vitals:   06/28/22 0319 06/28/22 0424 06/28/22 0737 06/28/22 1515  BP:  (!) 93/54 99/62 (!) 92/52  Pulse:  69 62 70  Resp:  '20 18 18  '$ Temp:  97.6 F (36.4 C) 98.4 F (36.9 C) 98.6 F (37 C)  TempSrc:  Oral    SpO2:  91% 93% 98%  Weight: 96.5 kg     Height:       General: Appear in mild distress; no visible Abnormal Neck Mass Or lumps, Conjunctiva normal Cardiovascular: S1 and S2 Present, no Murmur, Respiratory: increased respiratory effort, Bilateral Air entry present and bilateral Crackles, no wheezes Abdomen: Bowel Sound present, Non tender  Extremities: bilateral  Pedal edema Neurology: alert and oriented to time, place, and person  Gait not checked due to patient safety concerns   Data Reviewed: I have Reviewed nursing notes, Vitals, and Lab results since pt's last encounter. Pertinent lab results CBC and BMP I have ordered test including CBC and BMP I have reviewed the last note from general surgery,  I have discussed pt's care plan and test results with cardiology.  Family Communication: None at bedside  Disposition: Status is: Inpatient Remains  inpatient appropriate because: Receiving aggressive diuresis.  Author: Berle Mull, MD 06/28/2022 5:54 PM  Please look on [email or web address] to find out who is on call.

## 2022-06-29 DIAGNOSIS — J9621 Acute and chronic respiratory failure with hypoxia: Secondary | ICD-10-CM | POA: Diagnosis not present

## 2022-06-29 LAB — CBC WITH DIFFERENTIAL/PLATELET
Abs Immature Granulocytes: 0.02 10*3/uL (ref 0.00–0.07)
Basophils Absolute: 0.1 10*3/uL (ref 0.0–0.1)
Basophils Relative: 1 %
Eosinophils Absolute: 0.3 10*3/uL (ref 0.0–0.5)
Eosinophils Relative: 4 %
HCT: 50.4 % — ABNORMAL HIGH (ref 36.0–46.0)
Hemoglobin: 16.6 g/dL — ABNORMAL HIGH (ref 12.0–15.0)
Immature Granulocytes: 0 %
Lymphocytes Relative: 22 %
Lymphs Abs: 1.4 10*3/uL (ref 0.7–4.0)
MCH: 32.1 pg (ref 26.0–34.0)
MCHC: 32.9 g/dL (ref 30.0–36.0)
MCV: 97.5 fL (ref 80.0–100.0)
Monocytes Absolute: 1 10*3/uL (ref 0.1–1.0)
Monocytes Relative: 15 %
Neutro Abs: 3.7 10*3/uL (ref 1.7–7.7)
Neutrophils Relative %: 58 %
Platelets: 143 10*3/uL — ABNORMAL LOW (ref 150–400)
RBC: 5.17 MIL/uL — ABNORMAL HIGH (ref 3.87–5.11)
RDW: 18.6 % — ABNORMAL HIGH (ref 11.5–15.5)
WBC: 6.4 10*3/uL (ref 4.0–10.5)
nRBC: 0.3 % — ABNORMAL HIGH (ref 0.0–0.2)

## 2022-06-29 LAB — BASIC METABOLIC PANEL
Anion gap: 7 (ref 5–15)
BUN: 23 mg/dL (ref 8–23)
CO2: 32 mmol/L (ref 22–32)
Calcium: 8.7 mg/dL — ABNORMAL LOW (ref 8.9–10.3)
Chloride: 102 mmol/L (ref 98–111)
Creatinine, Ser: 1.21 mg/dL — ABNORMAL HIGH (ref 0.44–1.00)
GFR, Estimated: 47 mL/min — ABNORMAL LOW (ref >60)
Glucose, Bld: 91 mg/dL (ref 70–99)
Potassium: 3.7 mmol/L (ref 3.5–5.1)
Sodium: 141 mmol/L (ref 135–145)

## 2022-06-29 LAB — MAGNESIUM: Magnesium: 2.3 mg/dL (ref 1.7–2.4)

## 2022-06-29 LAB — HEMOGLOBIN A1C
Hgb A1c MFr Bld: 6.7 % — ABNORMAL HIGH (ref 4.8–5.6)
Mean Plasma Glucose: 145.59 mg/dL

## 2022-06-29 LAB — GLUCOSE, CAPILLARY
Glucose-Capillary: 167 mg/dL — ABNORMAL HIGH (ref 70–99)
Glucose-Capillary: 134 mg/dL — ABNORMAL HIGH (ref 70–99)
Glucose-Capillary: 104 mg/dL — ABNORMAL HIGH (ref 70–99)

## 2022-06-29 MED ORDER — TORSEMIDE 20 MG PO TABS
20.0000 mg | ORAL_TABLET | Freq: Every day | ORAL | Status: DC
  Administered 2022-06-29: 20 mg via ORAL
  Filled 2022-06-29: qty 1

## 2022-06-29 MED ORDER — SPIRONOLACTONE 25 MG PO TABS
12.5000 mg | ORAL_TABLET | Freq: Every day | ORAL | Status: DC
  Administered 2022-06-29: 12.5 mg via ORAL
  Filled 2022-06-29: qty 0.5
  Filled 2022-06-29: qty 1

## 2022-06-29 MED ORDER — TORSEMIDE 20 MG PO TABS: 20.0000 mg | ORAL_TABLET | Freq: Every day | ORAL | 0 refills | Status: DC

## 2022-06-29 NOTE — Evaluation (Signed)
Physical Therapy Evaluation Patient Details Name: Kari Medina MRN: [id number] DOB: 04-27-1946 Today's Date: 06/29/2022  History of Present Illness  Pt is a 76 year old female with a history of severe pulmonary hypertension on chronic oxygen therapy, thoracic aortic aneurysm (4.5 cm), type 2 diabetes, obstructive sleep apnea who was admitted with volume overload from wound clinic.  Found to have acute on chronic right-sided heart failure in the setting of pulmonary hypertension.   Clinical Impression  Patient alert, oriented x4, verbose with many questions addressed by PT as able. The patient reported at baseline her husband has recently been assisting with lower body dressing, stair navigation as needed (great strategies noted by PT to get in/out of the house per pt), and uses a tub bench for bathing.   The patient was able to move all extremities against gravity. Supine <> sit modI with time and bed rails. Sit <> Stand with RW and supervision, pt noted for very flexed trunk due to chronic back pain (moderate pain signs/symptoms noted with mobility, RN and administered tylenol at start of session). She was able to ambulate ~28f with supervision and RW, some fatigue noted at end, no LOB. Pt on 4L throughout and spO2 88% or higher.  Overall the patient demonstrated deficits (see "PT Problem List") that impede the patient's functional abilities, safety, and mobility and would benefit from skilled PT intervention. Recommendation at this time is HHPT to maximize function, activity tolerance and endurance.        Recommendations for follow up therapy are one component of a multi-disciplinary discharge planning process, led by the attending physician.  Recommendations may be updated based on patient status, additional functional criteria and insurance authorization.  Follow Up Recommendations Home health PT      Assistance Recommended at Discharge Frequent or constant Supervision/Assistance   Patient can return home with the following  A little help with walking and/or transfers;A little help with bathing/dressing/bathroom;Assistance with cooking/housework;Assist for transportation;Help with stairs or ramp for entrance;Direct supervision/assist for medications management    Equipment Recommendations None recommended by PT  Recommendations for Other Services       Functional Status Assessment Patient has had a recent decline in their functional status and demonstrates the ability to make significant improvements in function in a reasonable and predictable amount of time.     Precautions / Restrictions Precautions Precautions: Fall Restrictions Weight Bearing Restrictions: No      Mobility  Bed Mobility Overal bed mobility: Modified Independent                  Transfers Overall transfer level: Needs assistance Equipment used: Rolling walker (2 wheels) Transfers: Sit to/from Stand Sit to Stand: Supervision                Ambulation/Gait Ambulation/Gait assistance: Supervision Gait Distance (Feet): 25 Feet Assistive device: Rolling walker (2 wheels)         General Gait Details: extremely flexed trunk. no LOB. some fatigue noted  Stairs            Wheelchair Mobility    Modified Rankin (Stroke Patients Only)       Balance Overall balance assessment: Needs assistance Sitting-balance support: Feet supported Sitting balance-Leahy Scale: Good Sitting balance - Comments: able to don slippers at EOB   Standing balance support: Reliant on assistive device for balance Standing balance-Leahy Scale: Fair  Pertinent Vitals/Pain Pain Assessment Pain Assessment: Faces Faces Pain Scale: Hurts even more Pain Location: chronic low back pain Pain Descriptors / Indicators: Aching, Grimacing, Sore Pain Intervention(s): Limited activity within patient's tolerance, Monitored during session, Repositioned     Home Living Family/patient expects to be discharged to:: Private residence Living Arrangements: Spouse/significant other Available Help at Discharge: Family;Available 24 hours/day Type of Home: House Home Access: Stairs to enter Entrance Stairs-Rails: Right;Left;Can reach both Entrance Stairs-Number of Steps: 5-6   Home Layout: One level Home Equipment: Rollator (4 wheels);Shower seat;Grab bars - Agricultural consultant (2 wheels);Tub bench      Prior Function               Mobility Comments: ambulatory with rollator but says it gets away from her. husband assists with stair navigation ADLs Comments: husband assists with lower  body dressing, IADLs     Hand Dominance        Extremity/Trunk Assessment   Upper Extremity Assessment Upper Extremity Assessment: Generalized weakness    Lower Extremity Assessment Lower Extremity Assessment: Generalized weakness    Cervical / Trunk Assessment Cervical / Trunk Assessment: Kyphotic  Communication   Communication: No difficulties  Cognition Arousal/Alertness: Awake/alert Behavior During Therapy: WFL for tasks assessed/performed Overall Cognitive Status: Within Functional Limits for tasks assessed                                          General Comments      Exercises     Assessment/Plan    PT Assessment Patient needs continued PT services  PT Problem List Decreased strength;Decreased activity tolerance       PT Treatment Interventions DME instruction;Therapeutic exercise;Gait training;Balance training;Stair training;Neuromuscular re-education;Therapeutic activities;Patient/family education;Functional mobility training    PT Goals (Current goals can be found in the Care Plan section)  Acute Rehab PT Goals Patient Stated Goal: to move better PT Goal Formulation: With patient Time For Goal Achievement: 07/13/22 Potential to Achieve Goals: Good    Frequency Min 2X/week      Co-evaluation               AM-PAC PT "6 Clicks" Mobility  Outcome Measure Help needed turning from your back to your side while in a flat bed without using bedrails?: A Little Help needed moving from lying on your back to sitting on the side of a flat bed without using bedrails?: A Little Help needed moving to and from a bed to a chair (including a wheelchair)?: A Little Help needed standing up from a chair using your arms (e.g., wheelchair or bedside chair)?: A Little Help needed to walk in hospital room?: A Little Help needed climbing 3-5 steps with a railing? : A Lot 6 Click Score: 17    End of Session Equipment Utilized During Treatment: Gait belt;Oxygen (4L) Activity Tolerance: Patient tolerated treatment well Patient left: in bed;with call bell/phone within reach;with bed alarm set (RLE elevated) Nurse Communication: Mobility status PT Visit Diagnosis: Other abnormalities of gait and mobility (R26.89);Difficulty in walking, not elsewhere classified (R26.2);Muscle weakness (generalized) (M62.81)    Time: 7035-0093 PT Time Calculation (min) (ACUTE ONLY): 28 min   Charges:   PT Evaluation $PT Eval Low Complexity: 1 Low PT Treatments $Therapeutic Activity: 8-22 mins        Lieutenant Diego PT, DPT 3:18 PM,06/29/22

## 2022-06-29 NOTE — Progress Notes (Signed)
[facility] Cardiology  CARDIOLOGY CONSULT NOTE  Patient ID: Kari Medina MRN: [id number] DOB/AGE: [birth year] 74 y.o.  Admit date: 06/27/2022 Referring Physician Berle Mull Primary Physician Venia Carbon, MD Primary Cardiologist Andrez Grime, MD; Rich Reining Reason for Consultation Volume overload, severe PH  HPI:   Kari Medina is a 76 year old female with a history of severe pulmonary hypertension on chronic oxygen therapy, thoracic aortic aneurysm (4.5 cm), type 2 diabetes, obstructive sleep apnea who was admitted with volume overload from wound clinic.  Interval history - Diuresed well- -2.6 L yesterday.  - No complaints of shortness of breath compared to baseline. Swelling improved  Review of systems complete and found to be negative unless listed above     Past Medical History:  Diagnosis Date   Asthma    Chronic hypoxemic respiratory failure (HCC)    uses O2 with exertion and with CPAP   COPD (chronic obstructive pulmonary disease) (HCC)    Depression    Diabetes mellitus without complication (HCC)    Dyspnea    doe   Dysrhythmia    extra beat   Fatty liver    Fibromyalgia    Generalized osteoarthritis of multiple sites    History of hiatal hernia    Hypertension    Hypothyroidism    Melanoma ([place]) 07/2018   Right leg   OSA (obstructive sleep apnea)    Pain    chronic ruq and back pain   Panic attacks    PONV (postoperative nausea and vomiting)    after thyroidectomy   Raynaud disease    RLS (restless legs syndrome)    Spleen absent    TOLD ABSENT THEN TOLD DOES HAVE SPLEEN. PATIENT IS UNCERTAIN   Tremor, essential    Wears dentures    full upper and lower    Past Surgical History:  Procedure Laterality Date   CATARACT EXTRACTION W/PHACO Right 03/04/2021   Procedure: CATARACT EXTRACTION PHACO AND INTRAOCULAR LENS PLACEMENT (I[facility]) RIGHT DIABETIC 3.98 00:33.2;  Surgeon: Eulogio Bear, MD;  Location: [place];  Service:  Ophthalmology;  Laterality: Right;  Diabetic - oral meds   CATARACT EXTRACTION W/PHACO Left 03/18/2021   Procedure: CATARACT EXTRACTION PHACO AND INTRAOCULAR LENS PLACEMENT ([place]) LEFT;  Surgeon: Eulogio Bear, MD;  Location: [place];  Service: Ophthalmology;  Laterality: Left;  2.96 0:27.9   CHOLECYSTECTOMY     DILATATION & CURETTAGE/HYSTEROSCOPY WITH MYOSURE N/A 11/10/2018   Procedure: DILATATION & CURETTAGE/HYSTEROSCOPY WITH MYOSURE/MYOMECTOMY;  Surgeon: Aletha Halim, MD;  Location: [place];  Service: Gynecology;  Laterality: N/A;  possible myosure.  Please use myosure scope, do not open myosure blades but have in the room   JOINT REPLACEMENT Bilateral    2008/2011   MELANOMA EXCISION  07/2018   REVERSE SHOULDER ARTHROPLASTY Right 05/28/2017   Procedure: REVERSE SHOULDER ARTHROPLASTY;  Surgeon: Corky Mull, MD;  Location: [facility];  Service: Orthopedics;  Laterality: Right;   RHINOPLASTY  1972   RIGHT HEART CATH N/A 12/27/2021   Procedure: RIGHT HEART CATH;  Surgeon: Andrez Grime, MD;  Location: [place] [facility];  Service: Cardiovascular;  Laterality: N/A;   THYROIDECTOMY  2006   TOTAL KNEE ARTHROPLASTY     bilateral    Medications Prior to Admission  Medication Sig Dispense Refill Last Dose   acetaminophen (TYLENOL) 650 MG CR tablet Take 650 mg by mouth every 8 (eight) hours as needed for pain.   prn at prn   albuterol (  PROVENTIL HFA) 108 (90 Base) MCG/ACT inhaler Inhale 2 puffs into the lungs every 6 (six) hours as needed for wheezing or shortness of breath. 18 g 2 prn at prn   aspirin EC 81 MG tablet Take 81 mg by mouth 2 (two) times daily.   06/27/2022 at 0800   budesonide-formoterol (SYMBICORT) 160-4.5 MCG/ACT inhaler Inhale 2 puffs into the lungs 2 (two) times daily. 3 each 1 06/27/2022 at 0800   DULoxetine (CYMBALTA) 60 MG capsule TAKE 1 CAPSULE BY MOUTH  DAILY 90 capsule 3 06/27/2022 at 0800   furosemide (LASIX) 40 MG tablet TAKE ONE TABLET  BY MOUTH DAILY 30 tablet 1 06/27/2022 at 0800   ipratropium-albuterol (DUONEB) 0.5-2.5 (3) MG/3ML SOLN Inhale 3 mLs into the lungs 4 (four) times daily as needed.      ketoconazole (NIZORAL) 2 % cream Apply 1 application  topically daily. 45 g 3 06/27/2022 at 0800   levothyroxine (SYNTHROID) 200 MCG tablet Take 1 tablet (200 mcg total) by mouth daily before breakfast. 90 tablet 0 06/27/2022 at 0700   metFORMIN (GLUCOPHAGE-XR) 500 MG 24 hr tablet TAKE 1 TABLET BY MOUTH  DAILY WITH BREAKFAST 90 tablet 3 06/27/2022 at 0800   montelukast (SINGULAIR) 10 MG tablet TAKE 1 TABLET BY MOUTH AT  BEDTIME 90 tablet 3 06/26/2022 at 2000   propranolol (INDERAL) 40 MG tablet TAKE 1 TABLET BY MOUTH  TWICE DAILY 180 tablet 3 06/27/2022 at 0800   rOPINIRole (REQUIP) 3 MG tablet Take 1 tablet (3 mg total) by mouth at bedtime. 90 tablet 3 06/26/2022 at 2000   spironolactone (ALDACTONE) 25 MG tablet TAKE ONE TABLET BY MOUTH DAILY 30 tablet 1 06/27/2022 at 0800   sulfamethoxazole-trimethoprim (BACTRIM DS) 800-160 MG tablet Take 1 tablet by mouth 2 (two) times daily. For 3 days---for urine symptoms 18 tablet 2 Past Month   aspirin 325 MG EC tablet Take 162.5 mg by mouth daily. (Patient not taking: Reported on 06/27/2022)   Not Taking   calcium carbonate (OS-CAL) 600 MG TABS Take 600 mg by mouth daily with breakfast. (Patient not taking: Reported on 06/27/2022)   Not Taking   fluconazole (DIFLUCAN) 150 MG tablet Take 1 tablet (150 mg total) by mouth once a week. As needed (Patient not taking: Reported on 06/27/2022) 4 tablet 2 Not Taking   glucose blood (ONETOUCH VERIO) test strip Use to check blood sugar once a day. E11.9 100 each 3    ONETOUCH DELICA LANCETS FINE MISC 1 each by Does not apply route daily. Use to check blood sugar once a day. E11.9 100 each 3    Social History   Socioeconomic History   Marital status: Married    Spouse name: Not on file   Number of children: 1   Years of education: Not on file   Highest education level:  Not on file  Occupational History   Occupation: Neurosurgeon    Comment: Retired  Tobacco Use   Smoking status: Former    Packs/day: 0.25    Years: 5.00    Total pack years: 1.25    Types: Cigarettes    Quit date: 12/22/1988    Years since quitting: 33.5   Smokeless tobacco: Never  Vaping Use   Vaping Use: Never used  Substance and Sexual Activity   Alcohol use: No   Drug use: No   Sexual activity: Not on file  Other Topics Concern   Not on file  Social History Narrative   1 daughter  Has living will   Husband has health care POA. Alternate would be daughter Judson Roch   Would allow resuscitation but no prolonged machines   Not sure about feeding tubes   Social Determinants of Health   Financial Resource Strain: Not on file  Food Insecurity: Not on file  Transportation Needs: Not on file  Physical Activity: Not on file  Stress: Not on file  Social Connections: Not on file  Intimate Partner Violence: Not on file    Family History  Problem Relation Age of Onset   Osteoarthritis Mother    Diabetes Mother    Cirrhosis Mother    Cancer Father    Kidney cancer Father    Bladder Cancer Father    Heart disease Brother        stents in 1 brother   Breast cancer Neg Hx       Review of systems complete and found to be negative unless listed above      PHYSICAL EXAM  General: Laying in bed. NAD. Bath oxygen in place HEENT:  Normocephalic and atramatic Neck:  No JVD.  Lungs: Clear bilaterally to auscultation and percussion. Heart: HRRR . Normal S1 and S2 without gallops or murmurs.  Abdomen: Bowel sounds are positive, abdomen soft and non-tender  Msk:  Back normal, normal gait. Normal strength and tone for age. Extremities: 2+ LE edema. Wounds wrapped in BL LE.  Neuro: Alert and oriented X 3. Psych:  Good affect, responds appropriately  Labs:   Lab Results  Component Value Date   WBC 7.4 06/27/2022   HGB 16.3 (H) 06/27/2022   HCT 49.1 (H) 06/27/2022    MCV 96.8 06/27/2022   PLT 145 (L) 06/27/2022    Recent Labs  Lab 06/28/22 0708  NA 139  K 3.6  CL 104  CO2 26  BUN 26*  CREATININE 1.32*  CALCIUM 9.0  GLUCOSE 127*   No results found for: "CKTOTAL", "CKMB", "CKMBINDEX", "TROPONINI"  Lab Results  Component Value Date   CHOL 136 10/29/2021   CHOL 162 01/11/2021   CHOL 173 07/06/2020   Lab Results  Component Value Date   HDL 27.90 (L) 10/29/2021   HDL 40.00 01/11/2021   HDL 45.90 07/06/2020   Lab Results  Component Value Date   LDLCALC 78 10/29/2021   LDLCALC 87 01/11/2021   LDLCALC 102 (H) 07/06/2020   Lab Results  Component Value Date   TRIG 151.0 (H) 10/29/2021   TRIG 178.0 (H) 01/11/2021   TRIG 124.0 07/06/2020   Lab Results  Component Value Date   CHOLHDL 5 10/29/2021   CHOLHDL 4 01/11/2021   CHOLHDL 4 07/06/2020   No results found for: "LDLDIRECT"    Radiology: ECHOCARDIOGRAM COMPLETE  Result Date: 06/28/2022    ECHOCARDIOGRAM REPORT   Patient Name:   Kari Medina [place] Date of Exam: 06/28/2022 Medical Rec #:  [id number]             Height:       66.0 in Accession #:    [id number]            Weight:       212.7 lb Date of Birth:  05/01/1946              BSA:          2.053 m Patient Age:    29 years              BP:  93/54 mmHg Patient Gender: F                     HR:           66 bpm. Exam Location:  [facility] Procedure: 2D Echo Indications:     CHF I50.31  History:         Patient has prior history of Echocardiogram examinations, most                  recent 11/20/2021.  Sonographer:     Kathlen Brunswick RDCS Referring Phys:  [id number] Athena Masse Diagnosing Phys: Donnelly Angelica  Sonographer Comments: Technically challenging study due to limited acoustic windows, suboptimal apical window, no subcostal window and patient is morbidly obese. Image acquisition challenging due to patient body habitus. IMPRESSIONS  1. Left ventricular ejection fraction, by estimation, is 55 to 60%. The left ventricle has normal  function. The left ventricle has no regional wall motion abnormalities. There is mild left ventricular hypertrophy. Left ventricular diastolic parameters are indeterminate.  2. Right ventricular systolic function is severely reduced. The right ventricular size is severely enlarged.  3. Right atrial size was severely dilated.  4. The mitral valve is grossly normal. No evidence of mitral valve regurgitation.  5. The aortic valve is grossly normal. Aortic valve regurgitation is not visualized. No aortic stenosis is present. Conclusion(s)/Recommendation(s): Severe RV dysfunction. Unable to estimate RVSP d/t inadequate TR jet. Limited study d/t technical constraints. FINDINGS  Left Ventricle: Left ventricular ejection fraction, by estimation, is 55 to 60%. The left ventricle has normal function. The left ventricle has no regional wall motion abnormalities. The left ventricular internal cavity size was normal in size. There is  mild left ventricular hypertrophy. Left ventricular diastolic parameters are indeterminate. Right Ventricle: The right ventricular size is severely enlarged. Right vetricular wall thickness was not well visualized. Right ventricular systolic function is severely reduced. Left Atrium: Left atrial size was not well visualized. Right Atrium: Right atrial size was severely dilated. Pericardium: There is no evidence of pericardial effusion. Mitral Valve: The mitral valve is grossly normal. No evidence of mitral valve regurgitation. Tricuspid Valve: The tricuspid valve is normal in structure. Tricuspid valve regurgitation is mild. Aortic Valve: The aortic valve is grossly normal. Aortic valve regurgitation is not visualized. No aortic stenosis is present. Aortic valve peak gradient measures 3.6 mmHg. Pulmonic Valve: The pulmonic valve was not well visualized. Aorta: The aortic root is normal in size and structure. IAS/Shunts: The interatrial septum was not well visualized.  LEFT VENTRICLE PLAX 2D LVIDd:          4.81 cm LVIDs:         3.72 cm LV PW:         1.38 cm LV IVS:        1.34 cm LVOT diam:     1.90 cm LVOT Area:     2.84 cm  RIGHT VENTRICLE RV Basal diam:  5.00 cm RV S prime:     6.74 cm/s LEFT ATRIUM         Index       RIGHT ATRIUM           Index LA diam:    3.50 cm 1.70 cm/m  RA Area:     39.40 cm  RA Volume:   157.00 ml 76.47 ml/m  AORTIC VALVE AV Vmax:      95.00 cm/s AV Peak Grad: 3.6 mmHg  AORTA Ao Root diam: 3.30 cm MITRAL VALVE MV Area (PHT): 3.40 cm    SHUNTS MV Decel Time: 223 msec    Systemic Diam: 1.90 cm MV E velocity: 56.60 cm/s MV A velocity: 66.00 cm/s MV E/A ratio:  0.86 Donnelly Angelica Electronically signed by Donnelly Angelica Signature Date/Time: 06/28/2022/10:56:02 AM    Final    CT Angio Chest PE W and/or Wo Contrast  Result Date: 06/27/2022 CLINICAL DATA:  Increased work of breathing, shortness of breath, hypoxia EXAM: CT ANGIOGRAPHY CHEST WITH CONTRAST TECHNIQUE: Multidetector CT imaging of the chest was performed using the standard protocol during bolus administration of intravenous contrast. Multiplanar CT image reconstructions and MIPs were obtained to evaluate the vascular anatomy. RADIATION DOSE REDUCTION: This exam was performed according to the departmental dose-optimization program which includes automated exposure control, adjustment of the mA and/or kV according to patient size and/or use of iterative reconstruction technique. CONTRAST:  14m OMNIPAQUE IOHEXOL 350 MG/ML SOLN COMPARISON:  06/27/2022, 11/19/2021 FINDINGS: Cardiovascular: This is a technically adequate evaluation of the pulmonary vasculature. Slight mixing artifact within the main pulmonary arteries. No filling defects or pulmonary emboli. Dilated main pulmonary artery consistent with pulmonary arterial hypertension. The heart is enlarged without pericardial effusion, with prominent right ventricular dilation again noted. 4.5 cm ascending thoracic aortic aneurysm not significantly  changed since prior study. Evaluation of the aortic lumen is limited due to timing of contrast bolus. Stable aortic and coronary artery atherosclerosis. Mediastinum/Nodes: No enlarged mediastinal, hilar, or axillary lymph nodes. Thyroid gland, trachea, and esophagus demonstrate no significant findings. Lungs/Pleura: Scattered hypoventilatory changes are seen bilaterally, greatest in the right middle lobe and lingula. No acute airspace disease, effusion, or pneumothorax. Background emphysema unchanged. Central airways are patent. Upper Abdomen: No acute abnormality. Musculoskeletal: Chronic left posterior ninth through eleventh rib fractures are noted, with moderate callus formation. No acute bony abnormalities. Right shoulder arthroplasty. Reconstructed images demonstrate no additional findings. Review of the MIP images confirms the above findings. IMPRESSION: 1. No evidence of pulmonary embolus. 2. Stable 4.5 cm ascending thoracic aortic aneurysm. Recommend semi-annual imaging followup by CTA or MRA and referral to cardiothoracic surgery if not already obtained. This recommendation follows 2010 ACCF/AHA/AATS/ACR/ASA/SCA/SCAI/SIR/STS/SVM Guidelines for the Diagnosis and Management of Patients With Thoracic Aortic Disease. Circulation. 2010; 121:: E993-Z169 Aortic aneurysm NOS (ICD10-I71.9) 3. Chronic right ventricular dilation and enlargement of the main pulmonary artery, consistent with pulmonary arterial hypertension. 4. Aortic Atherosclerosis (ICD10-I70.0) and Emphysema (ICD10-J43.9). Electronically Signed   By: MRanda NgoM.D.   On: 06/27/2022 18:50   DG Chest 2 View  Result Date: 06/27/2022 CLINICAL DATA:  Shortness of breath EXAM: CHEST - 2 VIEW COMPARISON:  11/21/2021, CT 11/19/2021, chest x-ray 10/23/2018 FINDINGS: Mild cardiomegaly. Enlarged central pulmonary vessels suspicious for arterial hypertension. Airspace disease within the left lung base and posteriorly on lateral view. No pleural effusion  or pneumothorax. Hardware in the right shoulder IMPRESSION: 1. Airspace disease in the left lower lobe, possible pneumonia. Imaging follow-up to resolution is recommended 2. Cardiomegaly. Enlarged central pulmonary arteries consistent with arterial hypertension Electronically Signed   By: KDonavan FoilM.D.   On: 06/27/2022 17:23    Echo 02/2022-  NORMAL LEFT VENTRICULAR FUNCTION WITH MILD LVH   SEVERE RV SYSTOLIC DYSFUNCTION (See above)   VALVULAR REGURGITATION: TRIVIAL AR, MILD TR   NO VALVULAR STENOSIS  NORMAL SALINE MICROCAVITATION STUDY   RV SIZE, FUNCTION, AND PRESSURES BETTER VISUALIZED ON TODAY'S EXAM  EKG: Sinus rhythm with a rightward axis.  Nonspecific intraventricular conduction delay.  ASSESSMENT AND PLAN:  Kari Medina is a 76 year old female with a history of severe pulmonary hypertension on chronic oxygen therapy, thoracic aortic aneurysm (4.5 cm), type 2 diabetes, obstructive sleep apnea who was admitted with volume overload from wound clinic.  # Severe pulmonary HTN # Volume overload Right heart catheterization completed in January 2023 with a mean pulmonary artery pressure of approximately 55, but an elevated wedge of proximately 40.  Echo shows a severely dilated hypokinetic RV as well.  She is followed by Rich Reining at [facility] in pulmonary hypertension clinic. She appears slightly volume overloaded compared to her baseline with LE edema.  -Lasix 40 mg IV BID yesterday and x 1 dose this morning  - Transition to torsemide 20 mg daily (instead of lasix) this afternoon. Additional PRN for weight gain or swelling -Continue spironolactone - Oxygen is still above baseline; if able to be weaned to 3 L she appears stable for DC with outpatient f/u in 1-2 weeks with Dr. Nehemiah Massed.   Signed: Andrez Grime MD 06/29/2022, 8:02 AM

## 2022-06-29 NOTE — Plan of Care (Signed)
O2 sats back to baseline, daughter will bring oxygen at 6:00 and patient will be discharged home with family

## 2022-06-30 ENCOUNTER — Telehealth: Payer: Self-pay

## 2022-06-30 NOTE — Telephone Encounter (Signed)
Transition Care Management Unsuccessful Follow-up Telephone Call  Date of discharge and from where:  06/29/22 from [facility]  Attempts:  1st Attempt  Reason for unsuccessful TCM follow-up call:  Unable to leave message

## 2022-06-30 NOTE — Discharge Summary (Signed)
Physician Discharge Summary   Patient: Kari Medina MRN: [id number] DOB: 02-11-1946  Admit date:     06/27/2022  Discharge date: 06/29/2022  Discharge Physician: Berle Mull  PCP: Venia Carbon, MD  Recommendations at discharge:  Follow up with PCP in 1 week  Discharge Diagnoses: Principal Problem:   Acute on chronic respiratory failure with hypoxia ([place]) Active Problems:   Pulmonary hypertension (HCC)   Acute on chronic diastolic CHF (congestive heart failure) (HCC)   Recurrent venous ulcer of lower extremity (HCC)   AKI (acute kidney injury) (HCC)   Hypertension   OSA (obstructive sleep apnea)   COPD (chronic obstructive pulmonary disease) (HCC)   Obesity, Class III, BMI 40-49.9 (morbid obesity) ([place])   Thoracic ascending aortic aneurysm (HCC)   Hypothyroidism   Type 2 diabetes mellitus with diabetic neuropathy, unspecified [facility])   Hospital Course: 76 year old female with past medical history of COPD, chronic respiratory failure on 3 LPM, type II DM, OSA on CPAP, chronic lower extremity edema, chronic leg wound, TAA, morbid obesity, pulmonary HTN.  Presents to the hospital with complaints of progressively worsening shortness of breath as well as swelling in the leg.  Found to have acute on chronic right-sided heart failure in the setting of pulmonary hypertension.  Currently being treated with IV diuresis.  Assessment and Plan: Acute on chronic respiratory failure with hypoxia Pulmonary hypertension Acute on chronic diastolic CHF (congestive heart failure) Right heart catheter January 2023 shows mean pulmonary artery pressure of 55, elevated wedge of 40. Echocardiogram shows hypokinetic RV. Referred to [facility] pulmonary hypertension clinic. Appears to have mildly overloaded. Currently receiving IV diuresis with IV Lasix. Continue Aldactone as well. Echocardiogram shows 55 to 60%, Right ventricular systolic function is severely reduced. The right ventricular size is  severely enlarged. [facility]ology consultation.   Recurrent venous ulcer of lower extremity Appears to have nonhealing wounds on both legs for which she is following up with wound care clinic outpatient. At present does not appear to be acutely infected. Appreciate general surgery consultation. Recommendation is to be aggressive with diuresis for volume management and continue wound care. No indication for surgery.   AKI (acute kidney injury) At baseline serum creatinine appears to be around 0.9. On presentation serum creatinine 1.25. Marginally meeting criteria for AKI. Serum creatinine continues to trend up to 1.32. Appears to have cardiorenal hemodynamic.    Hypertension Blood pressure rather soft. We will continue torsemide   OSA (obstructive sleep apnea) Continue CPAP nightly.   COPD (chronic obstructive pulmonary disease) Does not appear to have any exacerbation. Continue current therapy   Morbid obesity. Body mass index is 34.34 kg/m.  Placing the patient at high risk of poor outcome.   Thoracic ascending aortic aneurysm CT angio this admission shows stable 4.5 cm ascending thoracic aortic aneurysm. Semiannual follow-up recommended.   Hypothyroidism Continue Synthroid.   Type 2 diabetes mellitus with diabetic neuropathy, unspecified without long-term insulin use. Appears to be controlled. hemoglobin A1c 6.7 Resume home regimen.   Consultants: Cardiology, General surgery Procedures performed:  none DISCHARGE MEDICATION: Allergies as of 06/29/2022       Reactions   Latex Hives   Tape and bandaids only   Nickel Dermatitis   Pramipexole Other (See Comments)   Caused hallucinations, says she can take name brand.   Prednisone    Makes her "feel crazy" - can take low dosage    Topiramate Other (See Comments)   "spaced out"   Citalopram Anxiety   Paroxetine  Hcl Anxiety   Sertraline Anxiety        Medication List     STOP taking these  medications    fluconazole 150 MG tablet Commonly known as: DIFLUCAN   furosemide 40 MG tablet Commonly known as: LASIX   propranolol 40 MG tablet Commonly known as: INDERAL   sulfamethoxazole-trimethoprim 800-160 MG tablet Commonly known as: BACTRIM DS       TAKE these medications    acetaminophen 650 MG CR tablet Commonly known as: TYLENOL Take 650 mg by mouth every 8 (eight) hours as needed for pain.   albuterol 108 (90 Base) MCG/ACT inhaler Commonly known as: Proventil HFA Inhale 2 puffs into the lungs every 6 (six) hours as needed for wheezing or shortness of breath.   aspirin EC 81 MG tablet Take 81 mg by mouth 2 (two) times daily. What changed: Another medication with the same name was removed. Continue taking this medication, and follow the directions you see here.   budesonide-formoterol 160-4.5 MCG/ACT inhaler Commonly known as: SYMBICORT Inhale 2 puffs into the lungs 2 (two) times daily.   calcium carbonate 600 MG Tabs tablet Commonly known as: OS-CAL Take 600 mg by mouth daily with breakfast.   DULoxetine 60 MG capsule Commonly known as: CYMBALTA TAKE 1 CAPSULE BY MOUTH  DAILY   ipratropium-albuterol 0.5-2.5 (3) MG/3ML Soln Commonly known as: DUONEB Inhale 3 mLs into the lungs 4 (four) times daily as needed.   ketoconazole 2 % cream Commonly known as: NIZORAL Apply 1 application  topically daily.   levothyroxine 200 MCG tablet Commonly known as: SYNTHROID Take 1 tablet (200 mcg total) by mouth daily before breakfast.   metFORMIN 500 MG 24 hr tablet Commonly known as: GLUCOPHAGE-XR TAKE 1 TABLET BY MOUTH  DAILY WITH BREAKFAST   montelukast 10 MG tablet Commonly known as: SINGULAIR TAKE 1 TABLET BY MOUTH AT  BEDTIME   OneTouch Delica Lancets Fine Misc 1 each by Does not apply route daily. Use to check blood sugar once a day. E11.9   OneTouch Verio test strip Generic drug: glucose blood Use to check blood sugar once a day. E11.9    rOPINIRole 3 MG tablet Commonly known as: REQUIP Take 1 tablet (3 mg total) by mouth at bedtime.   spironolactone 25 MG tablet Commonly known as: ALDACTONE TAKE ONE TABLET BY MOUTH DAILY   torsemide 20 MG tablet Commonly known as: DEMADEX Take 1 tablet (20 mg total) by mouth daily.               Discharge Care Instructions  (From admission, onward)           Start     Ordered   06/29/22 0000  Discharge wound care:       Comments: Continue prior to admission wound care, wet to dry   06/29/22 1619           Disposition: Home Diet recommendation: Cardiac diet  Discharge Exam: Vitals:   06/29/22 0430 06/29/22 0518 06/29/22 0730 06/29/22 1600  BP:  110/88 118/75 111/65  Pulse:  73 80 82  Resp:  20 18   Temp:  97.9 F (36.6 C) 97.8 F (36.6 C) 97.8 F (36.6 C)  TempSrc:   Oral Oral  SpO2:  92% 91% 95%  Weight: 95.7 kg     Height:       General: Appear in no distress; no visible Abnormal Neck Mass Or lumps, Conjunctiva normal Cardiovascular: S1 and S2 Present, no Murmur, Respiratory:  good respiratory effort, Bilateral Air entry present and CTA, no Crackles, no wheezes Abdomen: Bowel Sound present, Non tender Extremities: bilateral Pedal edema Neurology: alert and oriented to time, place, and person  Gait not checked due to patient safety concerns Filed Weights   06/27/22 2226 06/28/22 0319 06/29/22 0430  Weight: 97 kg 96.5 kg 95.7 kg   Condition at discharge: stable  The results of significant diagnostics from this hospitalization (including imaging, microbiology, ancillary and laboratory) are listed below for reference.   Imaging Studies: ECHOCARDIOGRAM COMPLETE  Result Date: 06/28/2022    ECHOCARDIOGRAM REPORT   Patient Name:   KAYIA BILLINGER Valko Date of Exam: 06/28/2022 Medical Rec #:  [id number]             Height:       66.0 in Accession #:    [id number]            Weight:       212.7 lb Date of Birth:  May 06, 1946              BSA:          2.053  m Patient Age:    39 years              BP:           93/54 mmHg Patient Gender: F                     HR:           66 bpm. Exam Location:  [facility] Procedure: 2D Echo Indications:     CHF I50.31  History:         Patient has prior history of Echocardiogram examinations, most                  recent 11/20/2021.  Sonographer:     Kathlen Brunswick RDCS Referring Phys:  [id number] Athena Masse Diagnosing Phys: Donnelly Angelica  Sonographer Comments: Technically challenging study due to limited acoustic windows, suboptimal apical window, no subcostal window and patient is morbidly obese. Image acquisition challenging due to patient body habitus. IMPRESSIONS  1. Left ventricular ejection fraction, by estimation, is 55 to 60%. The left ventricle has normal function. The left ventricle has no regional wall motion abnormalities. There is mild left ventricular hypertrophy. Left ventricular diastolic parameters are indeterminate.  2. Right ventricular systolic function is severely reduced. The right ventricular size is severely enlarged.  3. Right atrial size was severely dilated.  4. The mitral valve is grossly normal. No evidence of mitral valve regurgitation.  5. The aortic valve is grossly normal. Aortic valve regurgitation is not visualized. No aortic stenosis is present. Conclusion(s)/Recommendation(s): Severe RV dysfunction. Unable to estimate RVSP d/t inadequate TR jet. Limited study d/t technical constraints. FINDINGS  Left Ventricle: Left ventricular ejection fraction, by estimation, is 55 to 60%. The left ventricle has normal function. The left ventricle has no regional wall motion abnormalities. The left ventricular internal cavity size was normal in size. There is  mild left ventricular hypertrophy. Left ventricular diastolic parameters are indeterminate. Right Ventricle: The right ventricular size is severely enlarged. Right vetricular wall thickness was not well visualized. Right ventricular systolic function is  severely reduced. Left Atrium: Left atrial size was not well visualized. Right Atrium: Right atrial size was severely dilated. Pericardium: There is no evidence of pericardial effusion. Mitral Valve: The mitral valve is grossly normal. No evidence of mitral valve regurgitation. Tricuspid Valve: The tricuspid  valve is normal in structure. Tricuspid valve regurgitation is mild. Aortic Valve: The aortic valve is grossly normal. Aortic valve regurgitation is not visualized. No aortic stenosis is present. Aortic valve peak gradient measures 3.6 mmHg. Pulmonic Valve: The pulmonic valve was not well visualized. Aorta: The aortic root is normal in size and structure. IAS/Shunts: The interatrial septum was not well visualized.  LEFT VENTRICLE PLAX 2D LVIDd:         4.81 cm LVIDs:         3.72 cm LV PW:         1.38 cm LV IVS:        1.34 cm LVOT diam:     1.90 cm LVOT Area:     2.84 cm  RIGHT VENTRICLE RV Basal diam:  5.00 cm RV S prime:     6.74 cm/s LEFT ATRIUM         Index       RIGHT ATRIUM           Index LA diam:    3.50 cm 1.70 cm/m  RA Area:     39.40 cm                                 RA Volume:   157.00 ml 76.47 ml/m  AORTIC VALVE AV Vmax:      95.00 cm/s AV Peak Grad: 3.6 mmHg  AORTA Ao Root diam: 3.30 cm MITRAL VALVE MV Area (PHT): 3.40 cm    SHUNTS MV Decel Time: 223 msec    Systemic Diam: 1.90 cm MV E velocity: 56.60 cm/s MV A velocity: 66.00 cm/s MV E/A ratio:  0.86 Donnelly Angelica Electronically signed by Donnelly Angelica Signature Date/Time: 06/28/2022/10:56:02 AM    Final    CT Angio Chest PE W and/or Wo Contrast  Result Date: 06/27/2022 CLINICAL DATA:  Increased work of breathing, shortness of breath, hypoxia EXAM: CT ANGIOGRAPHY CHEST WITH CONTRAST TECHNIQUE: Multidetector CT imaging of the chest was performed using the standard protocol during bolus administration of intravenous contrast. Multiplanar CT image reconstructions and MIPs were obtained to evaluate the vascular anatomy. RADIATION DOSE REDUCTION:  This exam was performed according to the departmental dose-optimization program which includes automated exposure control, adjustment of the mA and/or kV according to patient size and/or use of iterative reconstruction technique. CONTRAST:  42m OMNIPAQUE IOHEXOL 350 MG/ML SOLN COMPARISON:  06/27/2022, 11/19/2021 FINDINGS: Cardiovascular: This is a technically adequate evaluation of the pulmonary vasculature. Slight mixing artifact within the main pulmonary arteries. No filling defects or pulmonary emboli. Dilated main pulmonary artery consistent with pulmonary arterial hypertension. The heart is enlarged without pericardial effusion, with prominent right ventricular dilation again noted. 4.5 cm ascending thoracic aortic aneurysm not significantly changed since prior study. Evaluation of the aortic lumen is limited due to timing of contrast bolus. Stable aortic and coronary artery atherosclerosis. Mediastinum/Nodes: No enlarged mediastinal, hilar, or axillary lymph nodes. Thyroid gland, trachea, and esophagus demonstrate no significant findings. Lungs/Pleura: Scattered hypoventilatory changes are seen bilaterally, greatest in the right middle lobe and lingula. No acute airspace disease, effusion, or pneumothorax. Background emphysema unchanged. Central airways are patent. Upper Abdomen: No acute abnormality. Musculoskeletal: Chronic left posterior ninth through eleventh rib fractures are noted, with moderate callus formation. No acute bony abnormalities. Right shoulder arthroplasty. Reconstructed images demonstrate no additional findings. Review of the MIP images confirms the above findings. IMPRESSION: 1. No evidence of pulmonary embolus. 2.  Stable 4.5 cm ascending thoracic aortic aneurysm. Recommend semi-annual imaging followup by CTA or MRA and referral to cardiothoracic surgery if not already obtained. This recommendation follows 2010 ACCF/AHA/AATS/ACR/ASA/SCA/SCAI/SIR/STS/SVM Guidelines for the Diagnosis and  Management of Patients With Thoracic Aortic Disease. Circulation. 2010; 121: U202-R427. Aortic aneurysm NOS (ICD10-I71.9) 3. Chronic right ventricular dilation and enlargement of the main pulmonary artery, consistent with pulmonary arterial hypertension. 4. Aortic Atherosclerosis (ICD10-I70.0) and Emphysema (ICD10-J43.9). Electronically Signed   By: Randa Ngo M.D.   On: 06/27/2022 18:50   DG Chest 2 View  Result Date: 06/27/2022 CLINICAL DATA:  Shortness of breath EXAM: CHEST - 2 VIEW COMPARISON:  11/21/2021, CT 11/19/2021, chest x-ray 10/23/2018 FINDINGS: Mild cardiomegaly. Enlarged central pulmonary vessels suspicious for arterial hypertension. Airspace disease within the left lung base and posteriorly on lateral view. No pleural effusion or pneumothorax. Hardware in the right shoulder IMPRESSION: 1. Airspace disease in the left lower lobe, possible pneumonia. Imaging follow-up to resolution is recommended 2. Cardiomegaly. Enlarged central pulmonary arteries consistent with arterial hypertension Electronically Signed   By: Donavan Foil M.D.   On: 06/27/2022 17:23    Microbiology: Results for orders placed or performed during the hospital encounter of 06/27/22  SARS Coronavirus 2 by RT PCR (hospital order, performed in [facility] hospital lab) *cepheid single result test* Anterior Nasal Swab     Status: None   Collection Time: 06/27/22  5:55 PM   Specimen: Anterior Nasal Swab  Result Value Ref Range Status   SARS Coronavirus 2 by RT PCR NEGATIVE NEGATIVE Final    Comment: (NOTE) SARS-CoV-2 target nucleic acids are NOT DETECTED.  The SARS-CoV-2 RNA is generally detectable in upper and lower respiratory specimens during the acute phase of infection. The lowest concentration of SARS-CoV-2 viral copies this assay can detect is 250 copies / mL. A negative result does not preclude SARS-CoV-2 infection and should not be used as the sole basis for treatment or other patient management decisions.   A negative result may occur with improper specimen collection / handling, submission of specimen other than nasopharyngeal swab, presence of viral mutation(s) within the areas targeted by this assay, and inadequate number of viral copies (<250 copies / mL). A negative result must be combined with clinical observations, patient history, and epidemiological information.  Fact Sheet for Patients:   https://www.Cici Rodriges.info/  Fact Sheet for Healthcare Providers: https://hall.com/  This test is not yet approved or  cleared by the [place] FDA and has been authorized for detection and/or diagnosis of SARS-CoV-2 by FDA under an Emergency Use Authorization (EUA).  This EUA will remain in effect (meaning this test can be used) for the duration of the COVID-19 declaration under Section 564(b)(1) of the Act, 21 U.S.C. section 360bbb-3(b)(1), unless the authorization is terminated or revoked sooner.  Performed at [facility], [address] [place], [place] [address]    Labs: CBC: Recent Labs  Lab 06/27/22 1643 06/29/22 0607  WBC 7.4 6.4  NEUTROABS  --  3.7  HGB 16.3* 16.6*  HCT 49.1* 50.4*  MCV 96.8 97.5  PLT 145* 628*   Basic Metabolic Panel: Recent Labs  Lab 06/27/22 1643 06/28/22 0708 06/29/22 0607  NA 140 139 141  K 4.2 3.6 3.7  CL 105 104 102  CO2 26 26 32  GLUCOSE 94 127* 91  BUN 24* 26* 23  CREATININE 1.25* 1.32* 1.21*  CALCIUM 9.1 9.0 8.7*  MG  --   --  2.3   Liver Function Tests: No  results for input(s): "AST", "ALT", "ALKPHOS", "BILITOT", "PROT", "ALBUMIN" in the last 168 hours. CBG: Recent Labs  Lab 06/28/22 1733 06/28/22 2108 06/29/22 0740 06/29/22 1229 06/29/22 1637  GLUCAP 127* 139* 167* 134* 104*   Discharge time spent: greater than 30 minutes.  Signed: Berle Mull, MD [facility] 06/29/2022

## 2022-07-01 NOTE — Telephone Encounter (Signed)
Transition Care Management Unsuccessful Follow-up Telephone Call  Date of discharge and from where:  06/29/22 from [facility]  Attempts:  2nd Attempt  Reason for unsuccessful TCM follow-up call:  Left voice message

## 2022-07-02 DIAGNOSIS — I10 Essential (primary) hypertension: Secondary | ICD-10-CM | POA: Diagnosis not present

## 2022-07-02 DIAGNOSIS — E11622 Type 2 diabetes mellitus with other skin ulcer: Secondary | ICD-10-CM | POA: Diagnosis not present

## 2022-07-02 DIAGNOSIS — L97812 Non-pressure chronic ulcer of other part of right lower leg with fat layer exposed: Secondary | ICD-10-CM | POA: Diagnosis not present

## 2022-07-02 DIAGNOSIS — I890 Lymphedema, not elsewhere classified: Secondary | ICD-10-CM | POA: Diagnosis not present

## 2022-07-02 DIAGNOSIS — L97822 Non-pressure chronic ulcer of other part of left lower leg with fat layer exposed: Secondary | ICD-10-CM | POA: Diagnosis not present

## 2022-07-02 DIAGNOSIS — J449 Chronic obstructive pulmonary disease, unspecified: Secondary | ICD-10-CM | POA: Diagnosis not present

## 2022-07-02 NOTE — Telephone Encounter (Signed)
Transition Care Management Follow-up Telephone Call Date of discharge and from where: 06/29/22 from [facility] How have you been since you were released from the hospital? Better but not great; still very tired Any questions or concerns? No  Items Reviewed: Did the pt receive and understand the discharge instructions provided? Yes  Medications obtained and verified? Yes  Other? No  Any new allergies since your discharge? No  Dietary orders reviewed? No Do you have support at home? Yes   Home Care and Equipment/Supplies: Were home health services ordered? Yes, [facility] If so, what is the name of the agency? Unsure, pt could not remember  Has the agency set up a time to come to the patient's home? no Were any new equipment or medical supplies ordered?  Yes: CPAP machine What is the name of the medical supply agency? [facility] Were you able to get the supplies/equipment? no Do you have any questions related to the use of the equipment or supplies? No  Functional Questionnaire: (I = Independent and D = Dependent) ADLs: I   Bathing/Dressing- I  Meal Prep- I  Eating- I  Maintaining continence- I  Transferring/Ambulation- I  Managing Meds- I  Follow up appointments reviewed:  PCP Hospital f/u appt confirmed? No  Scheduled to see n/a on n/a @ n/a. Pt prefers if available a VV or afternoon office visit. Sending msg to clinic to contact pt for apt. [facility] f/u appt confirmed? Yes  Scheduled to see Darylene Price on 7/28 @ 9. Are transportation arrangements needed? No  If their condition worsens, is the pt aware to call PCP or go to the Emergency Dept.? Yes Was the patient provided with contact information for the PCP's office or ED? Yes Was to pt encouraged to call back with questions or concerns? Yes

## 2022-07-02 NOTE — Telephone Encounter (Signed)
Spoke to pt. Made appt 07-07-22 at 145.

## 2022-07-03 LAB — BLOOD GAS, VENOUS
Acid-Base Excess: 3.8 mmol/L — ABNORMAL HIGH (ref 0.0–2.0)
Bicarbonate: 29.2 mmol/L — ABNORMAL HIGH (ref 20.0–28.0)
Patient temperature: 37
pCO2, Ven: 46 mmHg (ref 44–60)
pH, Ven: 7.41 (ref 7.25–7.43)
pO2, Ven: 31 mmHg — CL (ref 32–45)

## 2022-07-07 ENCOUNTER — Ambulatory Visit: Payer: Medicare Other | Admitting: Internal Medicine

## 2022-07-08 ENCOUNTER — Telehealth: Payer: Self-pay

## 2022-07-08 NOTE — Chronic Care Management (AMB) (Signed)
Chronic Care Management Pharmacy Assistant   Name: Kari Medina  MRN: [id number] DOB: 09/21/1946  Attempted contact with [name] 3 times on 07/14/22,07/15/22,07/21/22  Unsuccessful outreach. Will attempt contact next month.   Reason for Encounter: General Adherence   Recent office visits:  06/02/22-Richard Letdvak,MD(PCP)-f/u Discussed preventative care---she is not excited about cancer screening but will consider mammogram. Now off xanax---caused some hallucinations-Will give ketoconazole cream and weekly prn fluconazole '150mg'$ - foul urine (Will refill septra DS for 1-3 day Rx if bad)f/u 3 months 05/05/22-Javier Gutierrez,MD(fam med) foul urine odor,UA with culture,increase water intake, no medication changes  03/11/22-Richard Letvak,MD(PCP)-leg sores,edema  Recent consult visits:  06/27/22-Hoyt Stone PA,(wound)-diabetic wound care left leg. 06/25/22-Terry Fortin,MD-([facility]),Tomorrow take 60 mg of the Lasix 1 and one half tabs.weigh frequently,future labs 06/19/22-Herbon Fleming,MD(pulmo)f/u SOB,portable O2 2L,imaging reviewed,continue inhalers, nebulizer,f/u 6 weeks 05/20/22-Ryan Orgel,MD(cardio)-f/u fatigue-cpap uncomfortable,labs-no medication changes  Hospital visits:  Medication Reconciliation was completed by comparing discharge summary, patient's EMR and Pharmacy list, and upon discussion with patient.  Admitted to the hospital on 06/27/22 due to Respiratory Failure. Discharge date was 06/29/22. Discharged from [facility].    New?Medications Started at [facility] Discharge:?? -started Torsemide   Medication Changes at Hospital Discharge: -Changed  Aspirin EC  Medications Discontinued at Hospital Discharge: -Stopped Fluconazole     Propalolol     Furosemide     Bactrim    Medications that remain the same after Hospital Discharge:??  -All other medications will remain the same.    Medications: Outpatient Encounter Medications as of 07/08/2022   Medication Sig   acetaminophen (TYLENOL) 650 MG CR tablet Take 650 mg by mouth every 8 (eight) hours as needed for pain.   albuterol (PROVENTIL HFA) 108 (90 Base) MCG/ACT inhaler Inhale 2 puffs into the lungs every 6 (six) hours as needed for wheezing or shortness of breath.   aspirin EC 81 MG tablet Take 81 mg by mouth 2 (two) times daily.   budesonide-formoterol (SYMBICORT) 160-4.5 MCG/ACT inhaler Inhale 2 puffs into the lungs 2 (two) times daily.   calcium carbonate (OS-CAL) 600 MG TABS Take 600 mg by mouth daily with breakfast. (Patient not taking: Reported on 06/27/2022)   DULoxetine (CYMBALTA) 60 MG capsule TAKE 1 CAPSULE BY MOUTH  DAILY   glucose blood (ONETOUCH VERIO) test strip Use to check blood sugar once a day. E11.9   ipratropium-albuterol (DUONEB) 0.5-2.5 (3) MG/3ML SOLN Inhale 3 mLs into the lungs 4 (four) times daily as needed.   ketoconazole (NIZORAL) 2 % cream Apply 1 application  topically daily.   levothyroxine (SYNTHROID) 200 MCG tablet Take 1 tablet (200 mcg total) by mouth daily before breakfast.   metFORMIN (GLUCOPHAGE-XR) 500 MG 24 hr tablet TAKE 1 TABLET BY MOUTH  DAILY WITH BREAKFAST   montelukast (SINGULAIR) 10 MG tablet TAKE 1 TABLET BY MOUTH AT  BEDTIME   ONETOUCH DELICA LANCETS FINE MISC 1 each by Does not apply route daily. Use to check blood sugar once a day. E11.9   rOPINIRole (REQUIP) 3 MG tablet Take 1 tablet (3 mg total) by mouth at bedtime.   spironolactone (ALDACTONE) 25 MG tablet TAKE ONE TABLET BY MOUTH DAILY   torsemide (DEMADEX) 20 MG tablet Take 1 tablet (20 mg total) by mouth daily.   No facility-administered encounter medications on file as of 07/08/2022.     Patient is not more than 5 days past due for refill on the following medications per chart history:  [facility] Medications: Medication Name/mg Last Fill  Days Supply Metformin XR  '500mg'$   04/28/22  90   Upcoming appointments: PCP appointment on 07/11/22 and Cardiology appointment on  07/18/22    Charlene Brooke, CPP notified  Avel Sensor, [place]  [phone]

## 2022-07-08 NOTE — Telephone Encounter (Signed)
Pt called in wanted to make DR. Letvak aware her husband tested positive for [employer] . Wants to know should keep her appointment on 07/11/22 . Please Advise [phone]

## 2022-07-08 NOTE — Telephone Encounter (Signed)
Spoke to pt. She said it depends on how well she is feeling and how her husband is doing that day. She was asking about taking Mucinex. I suggested she contact the pulmonologist.

## 2022-07-09 ENCOUNTER — Other Ambulatory Visit: Payer: Self-pay | Admitting: Internal Medicine

## 2022-07-09 DIAGNOSIS — Z87891 Personal history of nicotine dependence: Secondary | ICD-10-CM | POA: Diagnosis not present

## 2022-07-09 DIAGNOSIS — L97822 Non-pressure chronic ulcer of other part of left lower leg with fat layer exposed: Secondary | ICD-10-CM | POA: Diagnosis not present

## 2022-07-09 DIAGNOSIS — Z7984 Long term (current) use of oral hypoglycemic drugs: Secondary | ICD-10-CM | POA: Diagnosis not present

## 2022-07-09 DIAGNOSIS — I2729 Other secondary pulmonary hypertension: Secondary | ICD-10-CM | POA: Diagnosis not present

## 2022-07-09 DIAGNOSIS — E11622 Type 2 diabetes mellitus with other skin ulcer: Secondary | ICD-10-CM | POA: Diagnosis not present

## 2022-07-09 DIAGNOSIS — I10 Essential (primary) hypertension: Secondary | ICD-10-CM | POA: Diagnosis not present

## 2022-07-09 DIAGNOSIS — I890 Lymphedema, not elsewhere classified: Secondary | ICD-10-CM | POA: Diagnosis not present

## 2022-07-09 DIAGNOSIS — L97812 Non-pressure chronic ulcer of other part of right lower leg with fat layer exposed: Secondary | ICD-10-CM | POA: Diagnosis not present

## 2022-07-09 DIAGNOSIS — G4730 Sleep apnea, unspecified: Secondary | ICD-10-CM | POA: Diagnosis not present

## 2022-07-09 DIAGNOSIS — J449 Chronic obstructive pulmonary disease, unspecified: Secondary | ICD-10-CM | POA: Diagnosis not present

## 2022-07-09 DIAGNOSIS — E1151 Type 2 diabetes mellitus with diabetic peripheral angiopathy without gangrene: Secondary | ICD-10-CM | POA: Diagnosis not present

## 2022-07-09 DIAGNOSIS — Z87440 Personal history of urinary (tract) infections: Secondary | ICD-10-CM | POA: Diagnosis not present

## 2022-07-09 DIAGNOSIS — Z9981 Dependence on supplemental oxygen: Secondary | ICD-10-CM | POA: Diagnosis not present

## 2022-07-09 DIAGNOSIS — M797 Fibromyalgia: Secondary | ICD-10-CM | POA: Diagnosis not present

## 2022-07-09 DIAGNOSIS — M1990 Unspecified osteoarthritis, unspecified site: Secondary | ICD-10-CM | POA: Diagnosis not present

## 2022-07-09 DIAGNOSIS — E1140 Type 2 diabetes mellitus with diabetic neuropathy, unspecified: Secondary | ICD-10-CM | POA: Diagnosis not present

## 2022-07-09 DIAGNOSIS — E039 Hypothyroidism, unspecified: Secondary | ICD-10-CM | POA: Diagnosis not present

## 2022-07-09 DIAGNOSIS — Z7951 Long term (current) use of inhaled steroids: Secondary | ICD-10-CM | POA: Diagnosis not present

## 2022-07-10 ENCOUNTER — Telehealth: Payer: Self-pay | Admitting: Internal Medicine

## 2022-07-10 MED ORDER — MOLNUPIRAVIR 200 MG PO CAPS: 4.0000 | ORAL_CAPSULE | Freq: Two times a day (BID) | ORAL | 0 refills | Status: DC

## 2022-07-10 NOTE — Telephone Encounter (Signed)
Patient called in cancelling her appointment for a hospital follow up due to not feeling well. Patient stated she took 4 Covid test in total and it is showing two lines on the test. She has been doing a lot of coughing and has some congestion. Patient husband tested positive for Covid on Monday 07/07/22. Patient is unable to do MyChart visit and not sure if she can do a telephone call. Please advise. Thank you!

## 2022-07-10 NOTE — Addendum Note (Signed)
Addended by: [name] I on: 07/10/2022 04:37 PM   Modules accepted: Orders

## 2022-07-10 NOTE — Telephone Encounter (Signed)
Symptoms started 2 days ago: Says she is coughing more with green think phlegm that started 2 days ago. Said she feels her lungs are fairly clear. Daughter bought her some cough med to take at night. It helps. Asking if there are any other suggestions.

## 2022-07-10 NOTE — Telephone Encounter (Signed)
Given her overall health status---I will start empiric molnupiravir  Let her know and someone should get it from [facility] for her (hopefully to start tonight) 4 capsules bid x 5 days

## 2022-07-10 NOTE — Telephone Encounter (Signed)
Spoke to pt. She will have her daughter go get it.

## 2022-07-11 ENCOUNTER — Inpatient Hospital Stay: Payer: Medicare Other | Admitting: Internal Medicine

## 2022-07-14 ENCOUNTER — Other Ambulatory Visit: Payer: Self-pay | Admitting: Internal Medicine

## 2022-07-18 ENCOUNTER — Ambulatory Visit: Payer: Medicare Other | Admitting: Family

## 2022-07-22 DIAGNOSIS — L97812 Non-pressure chronic ulcer of other part of right lower leg with fat layer exposed: Secondary | ICD-10-CM | POA: Diagnosis not present

## 2022-07-22 DIAGNOSIS — L97822 Non-pressure chronic ulcer of other part of left lower leg with fat layer exposed: Secondary | ICD-10-CM | POA: Diagnosis not present

## 2022-07-22 DIAGNOSIS — I890 Lymphedema, not elsewhere classified: Secondary | ICD-10-CM | POA: Diagnosis not present

## 2022-07-22 DIAGNOSIS — E11622 Type 2 diabetes mellitus with other skin ulcer: Secondary | ICD-10-CM | POA: Diagnosis not present

## 2022-07-22 DIAGNOSIS — J449 Chronic obstructive pulmonary disease, unspecified: Secondary | ICD-10-CM | POA: Diagnosis not present

## 2022-07-22 DIAGNOSIS — I10 Essential (primary) hypertension: Secondary | ICD-10-CM | POA: Diagnosis not present

## 2022-07-29 ENCOUNTER — Ambulatory Visit: Payer: Medicare Other | Admitting: Physician Assistant

## 2022-08-01 ENCOUNTER — Telehealth: Payer: Self-pay | Admitting: Internal Medicine

## 2022-08-01 DIAGNOSIS — I890 Lymphedema, not elsewhere classified: Secondary | ICD-10-CM | POA: Diagnosis not present

## 2022-08-01 DIAGNOSIS — J449 Chronic obstructive pulmonary disease, unspecified: Secondary | ICD-10-CM | POA: Diagnosis not present

## 2022-08-01 DIAGNOSIS — L97822 Non-pressure chronic ulcer of other part of left lower leg with fat layer exposed: Secondary | ICD-10-CM | POA: Diagnosis not present

## 2022-08-01 DIAGNOSIS — L97812 Non-pressure chronic ulcer of other part of right lower leg with fat layer exposed: Secondary | ICD-10-CM | POA: Diagnosis not present

## 2022-08-01 DIAGNOSIS — I10 Essential (primary) hypertension: Secondary | ICD-10-CM | POA: Diagnosis not present

## 2022-08-01 DIAGNOSIS — E11622 Type 2 diabetes mellitus with other skin ulcer: Secondary | ICD-10-CM | POA: Diagnosis not present

## 2022-08-01 MED ORDER — CEPHALEXIN 500 MG PO CAPS: 500.0000 mg | ORAL_CAPSULE | Freq: Three times a day (TID) | ORAL | 0 refills | Status: AC

## 2022-08-01 NOTE — Telephone Encounter (Signed)
Izora Gala from [facility] called in stating that [name] may need a antibiotic. She stated that her right leg is warm to the touch, red, and swollen, she thinks it may be infected. Stated that she has no fever (97.6) and that she has been having some chills. She also stated that she has new ulcer.

## 2022-08-01 NOTE — Telephone Encounter (Signed)
Spoke to pt and husband. They both think she needs the antibiotic. I made her an OV for 08-05-22 as she has appts already for 08-04-22. Sent med to [facility].

## 2022-08-01 NOTE — Addendum Note (Signed)
Addended by: Pilar Grammes on: 08/01/2022 04:02 PM   Modules accepted: Orders

## 2022-08-05 ENCOUNTER — Ambulatory Visit: Payer: Medicare Other | Admitting: Internal Medicine

## 2022-08-07 ENCOUNTER — Ambulatory Visit: Payer: Medicare Other | Admitting: Physician Assistant

## 2022-08-08 ENCOUNTER — Telehealth: Payer: Self-pay | Admitting: Internal Medicine

## 2022-08-08 DIAGNOSIS — J449 Chronic obstructive pulmonary disease, unspecified: Secondary | ICD-10-CM | POA: Diagnosis not present

## 2022-08-08 DIAGNOSIS — I890 Lymphedema, not elsewhere classified: Secondary | ICD-10-CM | POA: Diagnosis not present

## 2022-08-08 DIAGNOSIS — E11622 Type 2 diabetes mellitus with other skin ulcer: Secondary | ICD-10-CM | POA: Diagnosis not present

## 2022-08-08 DIAGNOSIS — E039 Hypothyroidism, unspecified: Secondary | ICD-10-CM | POA: Diagnosis not present

## 2022-08-08 DIAGNOSIS — L97822 Non-pressure chronic ulcer of other part of left lower leg with fat layer exposed: Secondary | ICD-10-CM | POA: Diagnosis not present

## 2022-08-08 DIAGNOSIS — E1140 Type 2 diabetes mellitus with diabetic neuropathy, unspecified: Secondary | ICD-10-CM | POA: Diagnosis not present

## 2022-08-08 DIAGNOSIS — Z7951 Long term (current) use of inhaled steroids: Secondary | ICD-10-CM | POA: Diagnosis not present

## 2022-08-08 DIAGNOSIS — G4730 Sleep apnea, unspecified: Secondary | ICD-10-CM | POA: Diagnosis not present

## 2022-08-08 DIAGNOSIS — Z87440 Personal history of urinary (tract) infections: Secondary | ICD-10-CM | POA: Diagnosis not present

## 2022-08-08 DIAGNOSIS — I10 Essential (primary) hypertension: Secondary | ICD-10-CM | POA: Diagnosis not present

## 2022-08-08 DIAGNOSIS — M797 Fibromyalgia: Secondary | ICD-10-CM | POA: Diagnosis not present

## 2022-08-08 DIAGNOSIS — L97812 Non-pressure chronic ulcer of other part of right lower leg with fat layer exposed: Secondary | ICD-10-CM | POA: Diagnosis not present

## 2022-08-08 DIAGNOSIS — Z87891 Personal history of nicotine dependence: Secondary | ICD-10-CM | POA: Diagnosis not present

## 2022-08-08 DIAGNOSIS — Z9981 Dependence on supplemental oxygen: Secondary | ICD-10-CM | POA: Diagnosis not present

## 2022-08-08 DIAGNOSIS — Z7984 Long term (current) use of oral hypoglycemic drugs: Secondary | ICD-10-CM | POA: Diagnosis not present

## 2022-08-08 DIAGNOSIS — E1151 Type 2 diabetes mellitus with diabetic peripheral angiopathy without gangrene: Secondary | ICD-10-CM | POA: Diagnosis not present

## 2022-08-08 DIAGNOSIS — I2729 Other secondary pulmonary hypertension: Secondary | ICD-10-CM | POA: Diagnosis not present

## 2022-08-08 DIAGNOSIS — M1990 Unspecified osteoarthritis, unspecified site: Secondary | ICD-10-CM | POA: Diagnosis not present

## 2022-08-08 NOTE — Telephone Encounter (Signed)
Kari Medina from [facility] called in stating she think the patient has cellulitis on left side. She stated she has a stage 3 ulcer that needs to be looked at. She couldn't make it to the wound clinic and she missed her pulmonary appointment. Her blood pressure 100/87, heart rate at 105, and her O2 is at 91 and she is on 3 liters of oxygen. She stated she can be reached at [phone]. Thank you!

## 2022-08-08 NOTE — Telephone Encounter (Signed)
Tried to call Kari Medina back but VM is full so I could not leave a message. She will need to call EMS. She has missed several appointments with Dr Silvio Pate. He has not seen her in 2 months. Recently canceled the 08-05-22 OV for the hospital f/u from July.   Spoke to pt's husband, Herbie Baltimore. He has placed a call to the wound center to see what they want her to do.

## 2022-08-11 ENCOUNTER — Ambulatory Visit: Payer: Medicare Other | Admitting: Physician Assistant

## 2022-08-11 ENCOUNTER — Telehealth: Payer: Self-pay

## 2022-08-11 NOTE — Telephone Encounter (Signed)
Kari Medina from [facility] calling in stating that they faxed over a form on the 15th of this month, wondering if we have received it.

## 2022-08-11 NOTE — Telephone Encounter (Signed)
Collie Siad from [facility] called in to know status of pt stated pt will need to be put on antibiotics  because or wound . Please Advise #[phone]

## 2022-08-11 NOTE — Telephone Encounter (Signed)
Tried to call Collie Siad, but her VM is full. I spoke to the pt's husband on 8-18. He was waiting to hear back from the wound care center about whether they think she needs an antibiotic. Dr Silvio Pate has not seen her since her last hospitalization. Collie Siad needs to get with pt's husband and see what he found out.

## 2022-08-14 NOTE — Telephone Encounter (Signed)
Spoke to pt. She is not interested in genetic testing. I left a message on VM for Marzetta Board advising her my pt (did not leave pt name) is not interested in their testing and to not contact us, again.

## 2022-08-18 ENCOUNTER — Encounter: Payer: Self-pay | Admitting: Internal Medicine

## 2022-08-18 ENCOUNTER — Ambulatory Visit (INDEPENDENT_AMBULATORY_CARE_PROVIDER_SITE_OTHER): Payer: Medicare Other | Admitting: Internal Medicine

## 2022-08-18 DIAGNOSIS — N1831 Chronic kidney disease, stage 3a: Secondary | ICD-10-CM

## 2022-08-18 DIAGNOSIS — I272 Pulmonary hypertension, unspecified: Secondary | ICD-10-CM | POA: Diagnosis not present

## 2022-08-18 DIAGNOSIS — E1140 Type 2 diabetes mellitus with diabetic neuropathy, unspecified: Secondary | ICD-10-CM | POA: Diagnosis not present

## 2022-08-18 DIAGNOSIS — I83012 Varicose veins of right lower extremity with ulcer of calf: Secondary | ICD-10-CM | POA: Diagnosis not present

## 2022-08-18 DIAGNOSIS — J449 Chronic obstructive pulmonary disease, unspecified: Secondary | ICD-10-CM

## 2022-08-18 DIAGNOSIS — L97212 Non-pressure chronic ulcer of right calf with fat layer exposed: Secondary | ICD-10-CM

## 2022-08-18 MED ORDER — TORSEMIDE 20 MG PO TABS: 20.0000 mg | ORAL_TABLET | Freq: Two times a day (BID) | ORAL | 5 refills | Status: DC

## 2022-08-18 MED ORDER — SPIRONOLACTONE 25 MG PO TABS
25.0000 mg | ORAL_TABLET | Freq: Every day | ORAL | 1 refills | Status: DC
Start: 2022-08-18 — End: 2023-03-03

## 2022-08-18 MED ORDER — SILVER SULFADIAZINE 1 % EX CREA: 1.0000 | TOPICAL_CREAM | Freq: Every day | CUTANEOUS | 0 refills | Status: DC

## 2022-08-18 NOTE — Patient Instructions (Signed)
Try off the metformin to see if that helps your arm rash. Monitor your sugars. Go back on the spironolactone '25mg'$  daily Increase the torsemide to '20mg'$  twice a day (morning and lunchtime). Hold the second dose if your legs are flat and fluid is gone

## 2022-08-18 NOTE — Assessment & Plan Note (Signed)
Breathing is very limited Is on the symbicort

## 2022-08-18 NOTE — Assessment & Plan Note (Signed)
Will recheck in 2 weeks on the increased diuretics

## 2022-08-18 NOTE — Progress Notes (Signed)
Subjective:    Patient ID: Kari Medina, female    DOB: 1946/12/10, 76 y.o.   MRN: [id number]  HPI Here for hospital follow up (early last month but hasn't been able to get in) and for reevaluation of home health needs  Her legs stay swollen and "I can't get these things healed up" Goes back 8 months Had COVID last month---did call Dr Raul Del and he got z-pak x 2 Also had molnupiravir and prednisone Cough was bad--but finally starting to improve  Breathing is okay Furosemide changed to torsemide Had been on spironolactone--but taken off by pulmonary hypertension specialist Weight is stable now  Home health RN for wound care and evaluation No therapy  Last GFR 47  Current Outpatient Medications on File Prior to Visit  Medication Sig Dispense Refill   acetaminophen (TYLENOL) 650 MG CR tablet Take 650 mg by mouth every 8 (eight) hours as needed for pain.     albuterol (PROVENTIL HFA) 108 (90 Base) MCG/ACT inhaler Inhale 2 puffs into the lungs every 6 (six) hours as needed for wheezing or shortness of breath. 18 g 2   aspirin EC 81 MG tablet Take 81 mg by mouth 2 (two) times daily.     budesonide-formoterol (SYMBICORT) 160-4.5 MCG/ACT inhaler Inhale 2 puffs into the lungs 2 (two) times daily. 3 each 1   DULoxetine (CYMBALTA) 60 MG capsule TAKE 1 CAPSULE BY MOUTH  DAILY 90 capsule 3   glucose blood (ONETOUCH VERIO) test strip Use to check blood sugar once a day. E11.9 100 each 3   ipratropium-albuterol (DUONEB) 0.5-2.5 (3) MG/3ML SOLN Inhale 3 mLs into the lungs 4 (four) times daily as needed.     ketoconazole (NIZORAL) 2 % cream Apply 1 application  topically daily. 45 g 3   levothyroxine (SYNTHROID) 200 MCG tablet TAKE 1 TABLET BY MOUTH DAILY  BEFORE BREAKFAST 90 tablet 3   metFORMIN (GLUCOPHAGE-XR) 500 MG 24 hr tablet TAKE 1 TABLET BY MOUTH  DAILY WITH BREAKFAST 90 tablet 3   montelukast (SINGULAIR) 10 MG tablet TAKE 1 TABLET BY MOUTH AT  BEDTIME 90 tablet 3   ONETOUCH  DELICA LANCETS FINE MISC 1 each by Does not apply route daily. Use to check blood sugar once a day. E11.9 100 each 3   rOPINIRole (REQUIP) 3 MG tablet Take 1 tablet (3 mg total) by mouth at bedtime. 90 tablet 3   torsemide (DEMADEX) 20 MG tablet Take 1 tablet (20 mg total) by mouth daily. 30 tablet 0   calcium carbonate (OS-CAL) 600 MG TABS Take 600 mg by mouth daily with breakfast. (Patient not taking: Reported on 08/18/2022)     molnupiravir EUA (LAGEVRIO) 200 MG CAPS capsule Take 4 capsules (800 mg total) by mouth in the morning and at bedtime. 40 capsule 0   No current facility-administered medications on file prior to visit.    Allergies  Allergen Reactions   Latex Hives    Tape and bandaids only   Nickel Dermatitis   Pramipexole Other (See Comments)    Caused hallucinations, says she can take name brand.   Prednisone     Makes her "feel crazy" - can take low dosage    Topiramate Other (See Comments)    "spaced out"   Citalopram Anxiety   Paroxetine Hcl Anxiety   Sertraline Anxiety    Past Medical History:  Diagnosis Date   Asthma    Chronic hypoxemic respiratory failure (HCC)    uses O2 with exertion and  with CPAP   COPD (chronic obstructive pulmonary disease) (HCC)    Depression    Diabetes mellitus without complication (HCC)    Dyspnea    doe   Dysrhythmia    extra beat   Fatty liver    Fibromyalgia    Generalized osteoarthritis of multiple sites    History of hiatal hernia    Hypertension    Hypothyroidism    Melanoma ([place]) 07/2018   Right leg   OSA (obstructive sleep apnea)    Pain    chronic ruq and back pain   Panic attacks    PONV (postoperative nausea and vomiting)    after thyroidectomy   Raynaud disease    RLS (restless legs syndrome)    Spleen absent    TOLD ABSENT THEN TOLD DOES HAVE SPLEEN. PATIENT IS UNCERTAIN   Tremor, essential    Wears dentures    full upper and lower    Past Surgical History:  Procedure Laterality Date   CATARACT  EXTRACTION W/PHACO Right 03/04/2021   Procedure: CATARACT EXTRACTION PHACO AND INTRAOCULAR LENS PLACEMENT ([facility]) RIGHT DIABETIC 3.98 00:33.2;  Surgeon: Eulogio Bear, MD;  Location: [facility];  Service: Ophthalmology;  Laterality: Right;  Diabetic - oral meds   CATARACT EXTRACTION W/PHACO Left 03/18/2021   Procedure: CATARACT EXTRACTION PHACO AND INTRAOCULAR LENS PLACEMENT ([place]) LEFT;  Surgeon: Eulogio Bear, MD;  Location: [facility];  Service: Ophthalmology;  Laterality: Left;  2.96 0:27.9   CHOLECYSTECTOMY     DILATATION & CURETTAGE/HYSTEROSCOPY WITH MYOSURE N/A 11/10/2018   Procedure: DILATATION & CURETTAGE/HYSTEROSCOPY WITH MYOSURE/MYOMECTOMY;  Surgeon: Aletha Halim, MD;  Location: [facility];  Service: Gynecology;  Laterality: N/A;  possible myosure.  Please use myosure scope, do not open myosure blades but have in the room   JOINT REPLACEMENT Bilateral    2008/2011   MELANOMA EXCISION  07/2018   REVERSE SHOULDER ARTHROPLASTY Right 05/28/2017   Procedure: REVERSE SHOULDER ARTHROPLASTY;  Surgeon: Corky Mull, MD;  Location: [facility];  Service: Orthopedics;  Laterality: Right;   RHINOPLASTY  1972   RIGHT HEART CATH N/A 12/27/2021   Procedure: RIGHT HEART CATH;  Surgeon: Andrez Grime, MD;  Location: [facility];  Service: Cardiovascular;  Laterality: N/A;   THYROIDECTOMY  2006   TOTAL KNEE ARTHROPLASTY     bilateral    Family History  Problem Relation Age of Onset   Osteoarthritis Mother    Diabetes Mother    Cirrhosis Mother    Cancer Father    Kidney cancer Father    Bladder Cancer Father    Heart disease Brother        stents in 1 brother   Breast cancer Neg Hx     Social History   Socioeconomic History   Marital status: Married    Spouse name: Not on file   Number of children: 1   Years of education: Not on file   Highest education level: Not on file  Occupational History   Occupation: Neurosurgeon     Comment: Retired  Tobacco Use   Smoking status: Former    Packs/day: 0.25    Years: 5.00    Total pack years: 1.25    Types: Cigarettes    Quit date: 12/22/1988    Years since quitting: 33.6   Smokeless tobacco: Never  Vaping Use   Vaping Use: Never used  Substance and Sexual Activity   Alcohol use: No   Drug  use: No   Sexual activity: Not on file  Other Topics Concern   Not on file  Social History Narrative   1 daughter      Has living will   Husband has health care POA. Alternate would be daughter Judson Roch   Would allow resuscitation but no prolonged machines   Not sure about feeding tubes   Social Determinants of Health   Financial Resource Strain: Not on file  Food Insecurity: Not on file  Transportation Needs: Not on file  Physical Activity: Not on file  Stress: Not on file  Social Connections: Not on file  Intimate Partner Violence: Not on file   Review of Systems Trouble sleeping at night--legs are burning Iodine paste is burning her Barely made it here----incontinent coming over and had to be changed    Objective:   Physical Exam Constitutional:      Appearance: Normal appearance.  Cardiovascular:     Rate and Rhythm: Normal rate and regular rhythm.     Heart sounds: No murmur heard.    No gallop.  Pulmonary:     Effort: Pulmonary effort is normal.     Breath sounds: Normal breath sounds. No wheezing or rales.  Musculoskeletal:     Cervical back: Neck supple.     Comments: 2+ edema in feet and calves  Lymphadenopathy:     Cervical: No cervical adenopathy.  Skin:    Comments: Multiple superficial ulcers on anterior calves Redness and tenderness but not really warm 1 deeper round ulcer lateral right calf----borders are not active (fluid wells in the ulcer)  Neurological:     Mental Status: She is alert.            Assessment & Plan:

## 2022-08-18 NOTE — Assessment & Plan Note (Signed)
Likely the most important reason for the edema and non healing ulcers

## 2022-08-18 NOTE — Assessment & Plan Note (Signed)
She wants to come off the metformin to see if it is causing her rash on arms

## 2022-08-18 NOTE — Assessment & Plan Note (Signed)
And on left All related to edema Will increase diuretics--torsemide to 20 bid and add back spironolactone Change to silvadene for soothing and antibacterial effect (iodine causing pain)

## 2022-08-19 DIAGNOSIS — L97822 Non-pressure chronic ulcer of other part of left lower leg with fat layer exposed: Secondary | ICD-10-CM | POA: Diagnosis not present

## 2022-08-19 DIAGNOSIS — I10 Essential (primary) hypertension: Secondary | ICD-10-CM | POA: Diagnosis not present

## 2022-08-19 DIAGNOSIS — Z7984 Long term (current) use of oral hypoglycemic drugs: Secondary | ICD-10-CM

## 2022-08-19 DIAGNOSIS — M797 Fibromyalgia: Secondary | ICD-10-CM | POA: Diagnosis not present

## 2022-08-19 DIAGNOSIS — E1140 Type 2 diabetes mellitus with diabetic neuropathy, unspecified: Secondary | ICD-10-CM | POA: Diagnosis not present

## 2022-08-19 DIAGNOSIS — E1151 Type 2 diabetes mellitus with diabetic peripheral angiopathy without gangrene: Secondary | ICD-10-CM | POA: Diagnosis not present

## 2022-08-19 DIAGNOSIS — M1990 Unspecified osteoarthritis, unspecified site: Secondary | ICD-10-CM | POA: Diagnosis not present

## 2022-08-19 DIAGNOSIS — Z87891 Personal history of nicotine dependence: Secondary | ICD-10-CM

## 2022-08-19 DIAGNOSIS — Z9981 Dependence on supplemental oxygen: Secondary | ICD-10-CM

## 2022-08-19 DIAGNOSIS — E039 Hypothyroidism, unspecified: Secondary | ICD-10-CM

## 2022-08-19 DIAGNOSIS — I890 Lymphedema, not elsewhere classified: Secondary | ICD-10-CM | POA: Diagnosis not present

## 2022-08-19 DIAGNOSIS — E11622 Type 2 diabetes mellitus with other skin ulcer: Secondary | ICD-10-CM | POA: Diagnosis not present

## 2022-08-19 DIAGNOSIS — G4730 Sleep apnea, unspecified: Secondary | ICD-10-CM | POA: Diagnosis not present

## 2022-08-19 DIAGNOSIS — L97812 Non-pressure chronic ulcer of other part of right lower leg with fat layer exposed: Secondary | ICD-10-CM | POA: Diagnosis not present

## 2022-08-19 DIAGNOSIS — Z87440 Personal history of urinary (tract) infections: Secondary | ICD-10-CM

## 2022-08-19 DIAGNOSIS — I2729 Other secondary pulmonary hypertension: Secondary | ICD-10-CM | POA: Diagnosis not present

## 2022-08-19 DIAGNOSIS — J449 Chronic obstructive pulmonary disease, unspecified: Secondary | ICD-10-CM | POA: Diagnosis not present

## 2022-08-19 DIAGNOSIS — Z7951 Long term (current) use of inhaled steroids: Secondary | ICD-10-CM

## 2022-08-20 ENCOUNTER — Telehealth: Payer: Self-pay | Admitting: Internal Medicine

## 2022-08-20 NOTE — Telephone Encounter (Signed)
Called and asked pt if she was taking it after it was removed from her Med list in June due to hallucinations. She said she is not having hallucinations on the alprazolam now. She said it helps to relax her at bedtime.

## 2022-08-20 NOTE — Telephone Encounter (Signed)
Caller Name: Janith Nielson  Call back phone #: [phone]  MEDICATION(S):  ALPRAZolam Duanne Moron) 0.25 MG tablet [[id number]]  DISCONTINUED  Days of Med Remaining: 2/3  Has the patient contacted their pharmacy (YES/NO)? NO What did pharmacy advise?    Preferred Pharmacy:  [facility], [address].   ~~~Please advise patient/caregiver to allow 2-3 business days to process RX refills.

## 2022-08-20 NOTE — Telephone Encounter (Signed)
Home Health verbal orders [place] Name:Nancy Agency Name: [facility] number: [phone]  Requesting OT/PT/Skilled nursing/Social Work/Speech: Treatment of lower leg  Reason:ulcer on legs  Frequency: daily  Please forward to [facility] pool or providers CMA

## 2022-08-20 NOTE — Telephone Encounter (Signed)
Orders were approved by Dr Silvio Pate by fax. I also spoke to nancy.

## 2022-08-21 ENCOUNTER — Ambulatory Visit: Payer: Medicare Other | Admitting: Internal Medicine

## 2022-08-21 MED ORDER — ALPRAZOLAM 0.25 MG PO TABS: 0.2500 mg | ORAL_TABLET | Freq: Two times a day (BID) | ORAL | 0 refills | Status: DC | PRN

## 2022-08-21 NOTE — Telephone Encounter (Signed)
Spoke to pt's husband per DPR. 

## 2022-08-21 NOTE — Telephone Encounter (Signed)
Please let them know I sent the Rx

## 2022-08-28 ENCOUNTER — Encounter: Payer: Medicare Other | Attending: Physician Assistant | Admitting: Physician Assistant

## 2022-08-28 DIAGNOSIS — M1990 Unspecified osteoarthritis, unspecified site: Secondary | ICD-10-CM | POA: Diagnosis not present

## 2022-08-28 DIAGNOSIS — I509 Heart failure, unspecified: Secondary | ICD-10-CM | POA: Diagnosis not present

## 2022-08-28 DIAGNOSIS — L97222 Non-pressure chronic ulcer of left calf with fat layer exposed: Secondary | ICD-10-CM | POA: Diagnosis not present

## 2022-08-28 DIAGNOSIS — I110 Hypertensive heart disease with heart failure: Secondary | ICD-10-CM | POA: Insufficient documentation

## 2022-08-28 DIAGNOSIS — G4730 Sleep apnea, unspecified: Secondary | ICD-10-CM | POA: Insufficient documentation

## 2022-08-28 DIAGNOSIS — J449 Chronic obstructive pulmonary disease, unspecified: Secondary | ICD-10-CM | POA: Diagnosis not present

## 2022-08-28 DIAGNOSIS — E11622 Type 2 diabetes mellitus with other skin ulcer: Secondary | ICD-10-CM | POA: Diagnosis not present

## 2022-08-28 DIAGNOSIS — I7300 Raynaud's syndrome without gangrene: Secondary | ICD-10-CM | POA: Diagnosis not present

## 2022-08-28 DIAGNOSIS — E1151 Type 2 diabetes mellitus with diabetic peripheral angiopathy without gangrene: Secondary | ICD-10-CM | POA: Insufficient documentation

## 2022-08-28 DIAGNOSIS — L97812 Non-pressure chronic ulcer of other part of right lower leg with fat layer exposed: Secondary | ICD-10-CM | POA: Insufficient documentation

## 2022-08-28 DIAGNOSIS — L97822 Non-pressure chronic ulcer of other part of left lower leg with fat layer exposed: Secondary | ICD-10-CM | POA: Diagnosis not present

## 2022-08-28 DIAGNOSIS — J8410 Pulmonary fibrosis, unspecified: Secondary | ICD-10-CM | POA: Insufficient documentation

## 2022-08-28 DIAGNOSIS — E1140 Type 2 diabetes mellitus with diabetic neuropathy, unspecified: Secondary | ICD-10-CM | POA: Insufficient documentation

## 2022-08-28 DIAGNOSIS — I2729 Other secondary pulmonary hypertension: Secondary | ICD-10-CM | POA: Insufficient documentation

## 2022-08-28 NOTE — Progress Notes (Addendum)
SUHEILY, BIRKS ([id number]) Visit Report for 08/28/2022 Chief Complaint Document Details Patient Name: Kari Medina, Kari Medina Date of Service: 08/28/2022 3:00 PM Medical Record Number: [id number] Patient Account Number: [id number] Date of Birth/Sex: [birth year] (76 y.o. F) Treating RN: Cornell Barman Primary Care Provider: [name] Other Clinician: Massie Kluver Referring Provider: [name] Treating Provider/Extender: Skipper Cliche in Treatment: 22 Information Obtained from: Patient Chief Complaint Bilateral LE Ulcers Electronic Signature(s) Signed: 08/28/2022 3:11:51 PM By: Worthy Keeler PA-C Entered By: Worthy Keeler on 08/28/2022 15:11:51 Trombly, Loura Pardon ([id number]) -------------------------------------------------------------------------------- HPI Details Patient Name: Kari Medina Date of Service: 08/28/2022 3:00 PM Medical Record Number: [id number] Patient Account Number: [id number] Date of Birth/Sex: 1946/07/08 (76 y.o. F) Treating RN: Cornell Barman Primary Care Provider: [name] Other Clinician: Massie Kluver Referring Provider: [name] Treating Provider/Extender: Skipper Cliche in Treatment: 22 History of Present Illness HPI Description: 03-27-2022 upon evaluation today patient presents for initial evaluation here in the clinic concerning issues she has been having with her wounds on her legs. Subsequently she is telling me that they burn and do hurt quite a bit. Unfortunately she is not really in the state of mind she tells me to allow for any sharp debridement for that reason were probably can have to try something different from the standpoint of topical medications and under wrap in order to try to get this under control. Fortunately I do believe this is good to be something we can likely heal but again it absolutely has to involve getting the swelling down if working to see any signs of improvement. The patient in particular tells me this started  December 2022 while she was in the hospital. She does go to [facility] primary care in March they sent her here. Currently she has been using Silvadene cream which is really not helping. She did have some falls not too far back where she did have some broken ribs with bruising. The patient does have a history of diabetes mellitus type 2 with a hemoglobin A1c of 7.23 October 2021 most recent. She also has a history of pulmonary hypertension, Raynaud's syndrome without gangrene, and hypertension in general. 05-01-2022 upon evaluation today patient appears to be doing okay in regard to her wounds. She has been tolerating the Iodoflex without significant complication. With that being said she does tell me that it burns somewhat but she is able to tolerate this they have gotten some ice packs that she uses which seems to soothe things to some degree. This is good news. With that being said I do not see any evidence of active infection locally or systemically at this time. She does note that it is really can be hard for her to come back and be seen frequently she request at least a 1 month check in between each time and to let home health manage things in between. 05-29-2022 upon evaluation today patient appears to be doing well currently in regard to her wound she is definitely showing signs of improvement which is great news. Fortunately I do not see any evidence of active infection locally or systemically which is also great news. No fevers, chills, nausea, vomiting, or diarrhea. 06-27-2022 upon evaluation today patient's wounds actually are doing about the same she still has a lot of leaking from these areas. With that being said I do not believe that this is just a venous issue I believe honestly she may be in a congestive heart failure exacerbation to be peripherally honest.  I discussed with her and her husband today that I really do believe she probably needs to go to the hospital. She was very reluctant to  do so which I can completely understand. Nonetheless in the end as I discussed this further I ended up calling her primary care provider's office as well and they essentially ended up with the same recommendation based on her oxygen saturations. 08-28-2022 upon evaluation today patient appears to be doing still poorly in regard to her legs. Fortunately I do not see any signs of infection unfortunately she still has open wounds on her legs she has quit using the Iodoflex at this point it was burning Dr. Silvio Pate put her back on the Silvadene cream. With that being said my concern about the Silvadene cream is simply that she is going to have an issue with getting this to heal using that due to the fact that it causes a decrease keratinocyte growth at 72 hours there is really nothing growing therefore is going to stop any wound healing due to the cytotoxicity. I discussed that with the patient today and I really feel like that may be something like silver cell would be better where she could actually have more of the drainage absorbed. I do think using the Tubigrip would also be helpful she ran out of those and has not been using it as its been 2 months since I saw her due to her having COVID. Electronic Signature(s) Signed: 08/28/2022 4:24:04 PM By: Worthy Keeler PA-C Entered By: Worthy Keeler on 08/28/2022 16:24:03 Fulghum, AHSHA HINSLEY ([id number]) -------------------------------------------------------------------------------- Physical Exam Details Patient Name: Kari Medina Date of Service: 08/28/2022 3:00 PM Medical Record Number: [id number] Patient Account Number: [id number] Date of Birth/Sex: 10/05/1946 (76 y.o. F) Treating RN: Cornell Barman Primary Care Provider: [name] Other Clinician: Massie Kluver Referring Provider: [name] Treating Provider/Extender: Skipper Cliche in Treatment: 64 Constitutional Well-nourished and well-hydrated in no acute distress. Respiratory normal  breathing without difficulty. Psychiatric this patient is able to make decisions and demonstrates good insight into disease process. Alert and Oriented x 3. pleasant and cooperative. Notes Upon inspection patient's wounds actually showed signs of poor granulation and epithelization at this point she has a lot of drainage and she has significant swelling of her legs although its not as bad as has been in the past. She is currently being monitored in regard to her heart as well as pulmonary fibrosis as well as her lower extremity swelling by her physicians including Dr. Silvio Pate among others at the heart failure clinic I believe that [facility] is where she goes typically. Nonetheless I think this is very touchy and difficult situation as far as monitoring and measuring this appropriately is concerned. Electronic Signature(s) Signed: 08/28/2022 4:46:40 PM By: Worthy Keeler PA-C Entered By: Worthy Keeler on 08/28/2022 16:46:40 Whitenack, Loura Pardon ([id number]) -------------------------------------------------------------------------------- Physician Orders Details Patient Name: Kari Medina Date of Service: 08/28/2022 3:00 PM Medical Record Number: [id number] Patient Account Number: [id number] Date of Birth/Sex: September 22, 1946 (76 y.o. F) Treating RN: Cornell Barman Primary Care Provider: [name] Other Clinician: Massie Kluver Referring Provider: [name] Treating Provider/Extender: Skipper Cliche in Treatment: 22 Verbal / Phone Orders: No Diagnosis Coding ICD-10 Coding Code Description E11.622 Type 2 diabetes mellitus with other skin ulcer L97.812 Non-pressure chronic ulcer of other part of right lower leg with fat layer exposed L97.822 Non-pressure chronic ulcer of other part of left lower leg with fat layer exposed I27.29 Other secondary pulmonary  hypertension I73.00 Raynaud's syndrome without gangrene I10 Essential (primary) hypertension Follow-up Appointments o Return Appointment  in 1 month - per pt request o Nurse Visit as needed [place]: - [facility] o ADMIT to [place] [facility] for wound care. May utilize formulary equivalent dressing for wound treatment orders unless otherwise specified. Home Health Nurse may visit PRN to address patientos wound care needs. o Scheduled days for dressing changes to be completed; exception, patient has scheduled wound care visit that day. o **Please direct any NON-WOUND related issues/requests for orders to patient's Primary Care Physician. **If current dressing causes regression in wound condition, may D/C ordered dressing product/s and apply Normal Saline Moist Dressing daily until next [place] or Other MD appointment. **Notify [facility] of regression in wound condition at [phone]. Bathing/ Shower/ Hygiene o Wash wounds with antibacterial soap and water. o May shower with wound dressing protected with water repellent cover or cast protector. o No tub bath. Edema Control - Lymphedema / Segmental Compressive Device / Other o Tubigrip single layer applied. - apply to each leg o Elevate leg(s) parallel to the floor when sitting. o DO YOUR BEST to sleep in the bed at night. DO NOT sleep in your recliner. Long hours of sitting in a recliner leads to swelling of the legs and/or potential wounds on your backside. Non-Wound Condition o Additional non-wound orders/instructions: - Eucerin or cedafil lotion for legs to help with comfort and itching. Wound Treatment Wound #3 - Lower Leg Wound Laterality: Left, Circumferential Cleanser: Normal Saline 3 x Per Week/30 Days Discharge Instructions: Wash your hands with soap and water. Remove old dressing, discard into plastic bag and place into trash. Cleanse the wound with Normal Saline prior to applying a clean dressing using gauze sponges, not tissues or cotton balls. Do not scrub or use excessive force. Pat dry using  gauze sponges, not tissue or cotton balls. Cleanser: Soap and Water 3 x Per Week/30 Days Discharge Instructions: Gently cleanse wound with antibacterial soap, rinse and pat dry prior to dressing wounds Primary Dressing: Silvercel 4 1/4x 4 1/4 (in/in) 3 x Per Week/30 Days Discharge Instructions: Apply Silvercel 4 1/4x 4 1/4 (in/in) as instructed Secondary Dressing: ABD Pad 5x9 (in/in) 3 x Per Week/30 Days Discharge Instructions: Cover with ABD pad Secondary Dressing: Kerlix 4.5 x 4.1 (in/yd) 3 x Per Week/30 Days Discharge Instructions: Apply Kerlix 4.5 x 4.1 (in/yd) as instructed Greenberger, Neftali E. ([id number]) Secured With: Medipore Tape - 27M Medipore H Soft Cloth Surgical Tape, 2x2 (in/yd) 3 x Per Week/30 Days Secured With: Tubigrip Size D, 3x10 (in/yd) 3 x Per Week/30 Days Discharge Instructions: Apply 3 Tubigrip D 3-finger-widths below knee to base of toes to secure dressing and/or for swelling. Wound #4 - Lower Leg Wound Laterality: Right Cleanser: Normal Saline 3 x Per Week/30 Days Discharge Instructions: Wash your hands with soap and water. Remove old dressing, discard into plastic bag and place into trash. Cleanse the wound with Normal Saline prior to applying a clean dressing using gauze sponges, not tissues or cotton balls. Do not scrub or use excessive force. Pat dry using gauze sponges, not tissue or cotton balls. Cleanser: Soap and Water 3 x Per Week/30 Days Discharge Instructions: Gently cleanse wound with antibacterial soap, rinse and pat dry prior to dressing wounds Primary Dressing: Silvercel 4 1/4x 4 1/4 (in/in) 3 x Per Week/30 Days Discharge Instructions: Apply Silvercel 4 1/4x 4 1/4 (in/in) as instructed Secondary Dressing: ABD Pad 5x9 (in/in)  3 x Per Week/30 Days Discharge Instructions: Cover with ABD pad Secondary Dressing: Kerlix 4.5 x 4.1 (in/yd) 3 x Per Week/30 Days Discharge Instructions: Apply Kerlix 4.5 x 4.1 (in/yd) as instructed Secured With: Medipore Tape - 48M  Medipore H Soft Cloth Surgical Tape, 2x2 (in/yd) 3 x Per Week/30 Days Secured With: Tubigrip Size D, 3x10 (in/yd) 3 x Per Week/30 Days Discharge Instructions: Apply 3 Tubigrip D 3-finger-widths below knee to base of toes to secure dressing and/or for swelling. Wound #5 - Lower Leg Wound Laterality: Left, Posterior Cleanser: Normal Saline 1 x Per Week/30 Days Discharge Instructions: Wash your hands with soap and water. Remove old dressing, discard into plastic bag and place into trash. Cleanse the wound with Normal Saline prior to applying a clean dressing using gauze sponges, not tissues or cotton balls. Do not scrub or use excessive force. Pat dry using gauze sponges, not tissue or cotton balls. Cleanser: Soap and Water 1 x Per Week/30 Days Discharge Instructions: Gently cleanse wound with antibacterial soap, rinse and pat dry prior to dressing wounds Primary Dressing: Iodosorb 40 (g) 1 x Per Week/30 Days Discharge Instructions: Applied only to two wounds to the midline leg and back of leg. Primary Dressing: Silvercel 4 1/4x 4 1/4 (in/in) 1 x Per Week/30 Days Discharge Instructions: Apply Silvercel 4 1/4x 4 1/4 (in/in) as instructed Secondary Dressing: ABD Pad 5x9 (in/in) 1 x Per Week/30 Days Discharge Instructions: Cover with ABD pad Secured With: Tubigrip Size D, 3x10 (in/yd) 1 x Per Week/30 Days Discharge Instructions: Apply 3 Tubigrip D 3-finger-widths below knee to base of toes to secure dressing and/or for swelling. Electronic Signature(s) Signed: 08/28/2022 5:14:26 PM By: Massie Kluver Signed: 08/28/2022 5:41:38 PM By: Worthy Keeler PA-C Entered By: Massie Kluver on 08/28/2022 16:11:45 Gabay, Loura Pardon ([id number]) -------------------------------------------------------------------------------- Problem List Details Patient Name: Kari Medina Date of Service: 08/28/2022 3:00 PM Medical Record Number: [id number] Patient Account Number: [id number] Date of Birth/Sex: Sep 18, 1946 (76 y.o.  F) Treating RN: Cornell Barman Primary Care Provider: [name] Other Clinician: Massie Kluver Referring Provider: [name] Treating Provider/Extender: Skipper Cliche in Treatment: 22 Active Problems ICD-10 Encounter Code Description Active Date MDM Diagnosis E11.622 Type 2 diabetes mellitus with other skin ulcer 03/27/2022 No Yes L97.812 Non-pressure chronic ulcer of other part of right lower leg with fat layer 03/27/2022 No Yes exposed L97.822 Non-pressure chronic ulcer of other part of left lower leg with fat layer 03/27/2022 No Yes exposed I27.29 Other secondary pulmonary hypertension 03/27/2022 No Yes I73.00 Raynaud's syndrome without gangrene 03/27/2022 No Yes I10 Essential (primary) hypertension 03/27/2022 No Yes Inactive Problems Resolved Problems Electronic Signature(s) Signed: 08/28/2022 3:11:46 PM By: Worthy Keeler PA-C Entered By: Worthy Keeler on 08/28/2022 15:11:46 Carias, Loura Pardon ([id number]) -------------------------------------------------------------------------------- Progress Note Details Patient Name: Kari Medina Date of Service: 08/28/2022 3:00 PM Medical Record Number: [id number] Patient Account Number: [id number] Date of Birth/Sex: December 31, 1945 (76 y.o. F) Treating RN: Cornell Barman Primary Care Provider: [name] Other Clinician: Massie Kluver Referring Provider: [name] Treating Provider/Extender: Skipper Cliche in Treatment: 22 Subjective Chief Complaint Information obtained from Patient Bilateral LE Ulcers History of Present Illness (HPI) 03-27-2022 upon evaluation today patient presents for initial evaluation here in the clinic concerning issues she has been having with her wounds on her legs. Subsequently she is telling me that they burn and do hurt quite a bit. Unfortunately she is not really in the state of mind she tells me to allow for any sharp  debridement for that reason were probably can have to try something different  from the standpoint of topical medications and under wrap in order to try to get this under control. Fortunately I do believe this is good to be something we can likely heal but again it absolutely has to involve getting the swelling down if working to see any signs of improvement. The patient in particular tells me this started December 2022 while she was in the hospital. She does go to [facility] primary care in March they sent her here. Currently she has been using Silvadene cream which is really not helping. She did have some falls not too far back where she did have some broken ribs with bruising. The patient does have a history of diabetes mellitus type 2 with a hemoglobin A1c of 7.23 October 2021 most recent. She also has a history of pulmonary hypertension, Raynaud's syndrome without gangrene, and hypertension in general. 05-01-2022 upon evaluation today patient appears to be doing okay in regard to her wounds. She has been tolerating the Iodoflex without significant complication. With that being said she does tell me that it burns somewhat but she is able to tolerate this they have gotten some ice packs that she uses which seems to soothe things to some degree. This is good news. With that being said I do not see any evidence of active infection locally or systemically at this time. She does note that it is really can be hard for her to come back and be seen frequently she request at least a 1 month check in between each time and to let home health manage things in between. 05-29-2022 upon evaluation today patient appears to be doing well currently in regard to her wound she is definitely showing signs of improvement which is great news. Fortunately I do not see any evidence of active infection locally or systemically which is also great news. No fevers, chills, nausea, vomiting, or diarrhea. 06-27-2022 upon evaluation today patient's wounds actually are doing about the same she still has a lot of  leaking from these areas. With that being said I do not believe that this is just a venous issue I believe honestly she may be in a congestive heart failure exacerbation to be peripherally honest. I discussed with her and her husband today that I really do believe she probably needs to go to the hospital. She was very reluctant to do so which I can completely understand. Nonetheless in the end as I discussed this further I ended up calling her primary care provider's office as well and they essentially ended up with the same recommendation based on her oxygen saturations. 08-28-2022 upon evaluation today patient appears to be doing still poorly in regard to her legs. Fortunately I do not see any signs of infection unfortunately she still has open wounds on her legs she has quit using the Iodoflex at this point it was burning Dr. Silvio Pate put her back on the Silvadene cream. With that being said my concern about the Silvadene cream is simply that she is going to have an issue with getting this to heal using that due to the fact that it causes a decrease keratinocyte growth at 72 hours there is really nothing growing therefore is going to stop any wound healing due to the cytotoxicity. I discussed that with the patient today and I really feel like that may be something like silver cell would be better where she could actually have more of the drainage  absorbed. I do think using the Tubigrip would also be helpful she ran out of those and has not been using it as its been 2 months since I saw her due to her having COVID. Objective Constitutional Well-nourished and well-hydrated in no acute distress. Vitals Time Taken: 3:13 PM, Temperature: 98.1 F, Pulse: 118 bpm, Respiratory Rate: 20 breaths/min, Blood Pressure: 115/76 mmHg. Respiratory normal breathing without difficulty. Psychiatric this patient is able to make decisions and demonstrates good insight into disease process. Alert and Oriented x 3.  pleasant and cooperative. Kari Medina, Kari Medina ([id number]) General Notes: Upon inspection patient's wounds actually showed signs of poor granulation and epithelization at this point she has a lot of drainage and she has significant swelling of her legs although its not as bad as has been in the past. She is currently being monitored in regard to her heart as well as pulmonary fibrosis as well as her lower extremity swelling by her physicians including Dr. Silvio Pate among others at the heart failure clinic I believe that [facility] is where she goes typically. Nonetheless I think this is very touchy and difficult situation as far as monitoring and measuring this appropriately is concerned. Integumentary (Hair, Skin) Wound #3 status is Open. Original cause of wound was Gradually Appeared. The date acquired was: 12/05/2021. The wound has been in treatment 22 weeks. The wound is located on the Left,Circumferential Lower Leg. The wound measures 14.8cm length x 11.5cm width x 0.1cm depth; 133.675cm^2 area and 13.367cm^3 volume. There is Fat Layer (Subcutaneous Tissue) exposed. There is a medium amount of serosanguineous drainage noted. There is small (1-33%) red, pink granulation within the wound bed. There is a large (67-100%) amount of necrotic tissue within the wound bed including Adherent Slough. Wound #4 status is Open. Original cause of wound was Gradually Appeared. The date acquired was: 05/01/2022. The wound has been in treatment 17 weeks. The wound is located on the Right Lower Leg. The wound measures 4.5cm length x 9.5cm width x 0.4cm depth; 33.576cm^2 area and 13.43cm^3 volume. There is Fat Layer (Subcutaneous Tissue) exposed. There is a medium amount of serosanguineous drainage noted. There is small (1-33%) red granulation within the wound bed. There is a medium (34-66%) amount of necrotic tissue within the wound bed including Adherent Slough. Wound #5 status is Open. Original cause of wound was  Shear/Friction. The date acquired was: 01/23/2022. The wound has been in treatment 13 weeks. The wound is located on the Left,Posterior Lower Leg. The wound measures 1.2cm length x 0.7cm width x 0.2cm depth; 0.66cm^2 area and 0.132cm^3 volume. There is Fat Layer (Subcutaneous Tissue) exposed. There is a medium amount of serosanguineous drainage noted. There is small (1-33%) red granulation within the wound bed. There is a large (67-100%) amount of necrotic tissue within the wound bed including Adherent Slough. Assessment Active Problems ICD-10 Type 2 diabetes mellitus with other skin ulcer Non-pressure chronic ulcer of other part of right lower leg with fat layer exposed Non-pressure chronic ulcer of other part of left lower leg with fat layer exposed Other secondary pulmonary hypertension Raynaud's syndrome without gangrene Essential (primary) hypertension Plan Follow-up Appointments: Return Appointment in 1 month - per pt request Nurse Visit as needed Home Health: [place]: - [facility] ADMIT to [place] for wound care. May utilize formulary equivalent dressing for wound treatment orders unless otherwise specified. Home Health Nurse may visit [facility]N to address patient s wound care needs. Scheduled days for dressing changes to be completed; exception, patient has  scheduled wound care visit that day. **Please direct any NON-WOUND related issues/requests for orders to patient's Primary Care Physician. **If current dressing causes regression in wound condition, may D/C ordered dressing product/s and apply Normal Saline Moist Dressing daily until next [place] or Other MD appointment. **Notify [facility] of regression in wound condition at [phone]. Bathing/ Shower/ Hygiene: Wash wounds with antibacterial soap and water. May shower with wound dressing protected with water repellent cover or cast protector. No tub bath. Edema Control - Lymphedema / Segmental  Compressive Device / Other: Tubigrip single layer applied. - apply to each leg Elevate leg(s) parallel to the floor when sitting. DO YOUR BEST to sleep in the bed at night. DO NOT sleep in your recliner. Long hours of sitting in a recliner leads to swelling of the legs and/or potential wounds on your backside. Non-Wound Condition: Additional non-wound orders/instructions: - Eucerin or cedafil lotion for legs to help with comfort and itching. WOUND #3: - Lower Leg Wound Laterality: Left, Circumferential Cleanser: Normal Saline 3 x Per Week/30 Days Discharge Instructions: Wash your hands with soap and water. Remove old dressing, discard into plastic bag and place into trash. Cleanse the wound with Normal Saline prior to applying a clean dressing using gauze sponges, not tissues or cotton balls. Do not scrub or use excessive force. Pat dry using gauze sponges, not tissue or cotton balls. Cleanser: Soap and Water 3 x Per Week/30 Days Discharge Instructions: Gently cleanse wound with antibacterial soap, rinse and pat dry prior to dressing wounds Primary Dressing: Silvercel 4 1/4x 4 1/4 (in/in) 3 x Per Week/30 Days Kimbley, Sonika E. ([id number]) Discharge Instructions: Apply Silvercel 4 1/4x 4 1/4 (in/in) as instructed Secondary Dressing: ABD Pad 5x9 (in/in) 3 x Per Week/30 Days Discharge Instructions: Cover with ABD pad Secondary Dressing: Kerlix 4.5 x 4.1 (in/yd) 3 x Per Week/30 Days Discharge Instructions: Apply Kerlix 4.5 x 4.1 (in/yd) as instructed Secured With: Medipore Tape - 35M Medipore H Soft Cloth Surgical Tape, 2x2 (in/yd) 3 x Per Week/30 Days Secured With: Tubigrip Size D, 3x10 (in/yd) 3 x Per Week/30 Days Discharge Instructions: Apply 3 Tubigrip D 3-finger-widths below knee to base of toes to secure dressing and/or for swelling. WOUND #4: - Lower Leg Wound Laterality: Right Cleanser: Normal Saline 3 x Per Week/30 Days Discharge Instructions: Wash your hands with soap and water. Remove  old dressing, discard into plastic bag and place into trash. Cleanse the wound with Normal Saline prior to applying a clean dressing using gauze sponges, not tissues or cotton balls. Do not scrub or use excessive force. Pat dry using gauze sponges, not tissue or cotton balls. Cleanser: Soap and Water 3 x Per Week/30 Days Discharge Instructions: Gently cleanse wound with antibacterial soap, rinse and pat dry prior to dressing wounds Primary Dressing: Silvercel 4 1/4x 4 1/4 (in/in) 3 x Per Week/30 Days Discharge Instructions: Apply Silvercel 4 1/4x 4 1/4 (in/in) as instructed Secondary Dressing: ABD Pad 5x9 (in/in) 3 x Per Week/30 Days Discharge Instructions: Cover with ABD pad Secondary Dressing: Kerlix 4.5 x 4.1 (in/yd) 3 x Per Week/30 Days Discharge Instructions: Apply Kerlix 4.5 x 4.1 (in/yd) as instructed Secured With: Medipore Tape - 35M Medipore H Soft Cloth Surgical Tape, 2x2 (in/yd) 3 x Per Week/30 Days Secured With: Tubigrip Size D, 3x10 (in/yd) 3 x Per Week/30 Days Discharge Instructions: Apply 3 Tubigrip D 3-finger-widths below knee to base of toes to secure dressing and/or for swelling. WOUND #5: - Lower Leg Wound  Laterality: Left, Posterior Cleanser: Normal Saline 1 x Per Week/30 Days Discharge Instructions: Wash your hands with soap and water. Remove old dressing, discard into plastic bag and place into trash. Cleanse the wound with Normal Saline prior to applying a clean dressing using gauze sponges, not tissues or cotton balls. Do not scrub or use excessive force. Pat dry using gauze sponges, not tissue or cotton balls. Cleanser: Soap and Water 1 x Per Week/30 Days Discharge Instructions: Gently cleanse wound with antibacterial soap, rinse and pat dry prior to dressing wounds Primary Dressing: Iodosorb 40 (g) 1 x Per Week/30 Days Discharge Instructions: Applied only to two wounds to the midline leg and back of leg. Primary Dressing: Silvercel 4 1/4x 4 1/4 (in/in) 1 x Per Week/30  Days Discharge Instructions: Apply Silvercel 4 1/4x 4 1/4 (in/in) as instructed Secondary Dressing: ABD Pad 5x9 (in/in) 1 x Per Week/30 Days Discharge Instructions: Cover with ABD pad Secured With: Tubigrip Size D, 3x10 (in/yd) 1 x Per Week/30 Days Discharge Instructions: Apply 3 Tubigrip D 3-finger-widths below knee to base of toes to secure dressing and/or for swelling. 1. I would recommend currently based on what we are seeing that we go ahead and initiate a continuation of treatment with the silver alginate dressing which I think is good to be the best way to go. 2. We will continue with ABD pads to cover and the roll gauze to secure in place followed by Tubigrip. 3. I am also can recommend the patient should continue to monitor for any signs of worsening or infection I really do not want her using the Silvadene as it can be cytotoxic and in fact is very cytotoxic. I discussed that with her today. In place of that we do use the silver alginate. We will see patient back for reevaluation in 1 month here in the clinic. If anything worsens or changes patient will contact our office for additional recommendations. Electronic Signature(s) Signed: 08/28/2022 5:14:41 PM By: Worthy Keeler PA-C Entered By: Worthy Keeler on 08/28/2022 17:14:40 Sky, Loura Pardon ([id number]) -------------------------------------------------------------------------------- SuperBill Details Patient Name: Kari Medina Date of Service: 08/28/2022 Medical Record Number: [id number] Patient Account Number: [id number] Date of Birth/Sex: 12-Sep-1946 (76 y.o. F) Treating RN: Cornell Barman Primary Care Provider: [name] Other Clinician: Massie Kluver Referring Provider: [name] Treating Provider/Extender: Skipper Cliche in Treatment: 22 Diagnosis Coding ICD-10 Codes Code Description E11.622 Type 2 diabetes mellitus with other skin ulcer L97.812 Non-pressure chronic ulcer of other part of right lower leg  with fat layer exposed L97.822 Non-pressure chronic ulcer of other part of left lower leg with fat layer exposed I27.29 Other secondary pulmonary hypertension I73.00 Raynaud's syndrome without gangrene I10 Essential (primary) hypertension Facility Procedures CPT4 Code: [id number] Description: [phone] - WOUND CARE VISIT-LEV 5 EST PT Modifier: Quantity: 1 Physician Procedures CPT4 Code: [id number] Description: 99213 - WC PHYS LEVEL 3 - EST PT Modifier: Quantity: 1 CPT4 Code: Description: ICD-10 Diagnosis Description E11.622 Type 2 diabetes mellitus with other skin ulcer L97.822 Non-pressure chronic ulcer of other part of left lower leg with fat lay L97.812 Non-pressure chronic ulcer of other part of right lower leg with fat  la I27.29 Other secondary pulmonary hypertension Modifier: er exposed yer exposed Quantity: Electronic Signature(s) Signed: 08/28/2022 5:14:57 PM By: Worthy Keeler PA-C Entered By: Worthy Keeler on 08/28/2022 17:14:56

## 2022-08-29 NOTE — Progress Notes (Addendum)
Kari Medina, Kari Medina ([id number]) [id number] Page 1 of 11 Visit Report for 08/28/2022 Arrival Information Details Patient Name: Date of Service: Kari Medina [place] 08/28/2022 3:00 PM Medical Record Number: [id number] Patient Account Number: [id number] Date of Birth/Sex: Treating RN: [birth year] (76 y.o. Kari Medina, [name] Primary Care Kari Medina: [name] Other Clinician: Massie Kluver Referring Carlyne Keehan: Treating Patriece Medina/Extender: Zachery Dakins in Treatment: 62 Visit Information History Since Last Visit All ordered tests and consults were completed: No Patient Arrived: Wheel Chair Added or deleted any medications: No Arrival Time: 15:09 Any new allergies or adverse reactions: No Transfer Assistance: EasyPivot Patient Lift Had a fall or experienced change in No Patient Has Alerts: Yes activities of daily living that may affect Patient Alerts: Patient on Blood Thinner risk of falls: Hospitalized since last visit: No Pain Present Now: Yes Electronic Signature(s) Signed: 08/28/2022 4:50:01 PM By: Carlene Coria RN Entered By: Carlene Coria on 08/28/2022 16:50:01 -------------------------------------------------------------------------------- Clinic Level of Care Assessment Details Patient Name: Date of Service: Kari Medina, Kari Medina. 08/28/2022 3:00 PM Medical Record Number: [id number] Patient Account Number: [id number] Date of Birth/Sex: Treating RN: Apr 26, 1946 (76 y.o. Kari Medina Primary Care Kari Medina: [name] Other Clinician: Massie Kluver Referring Demita Tobia: Treating Kari Medina/Extender: Zachery Dakins in Treatment: 22 Clinic Level of Care Assessment Items TOOL 4 Quantity Score '[]'$  - 0 Use when only an EandM is performed on FOLLOW-UP visit ASSESSMENTS - Nursing Assessment / Reassessment X- 1 10 Reassessment of Co-morbidities (includes updates in patient status) X- 1 5 Reassessment of Adherence to Treatment  Plan ASSESSMENTS - Wound and Skin A ssessment / Reassessment '[]'$  - 0 Simple Wound Assessment / Reassessment - one wound X- 3 5 Complex Wound Assessment / Reassessment - multiple wounds Colquhoun, Kari Medina ([id number]) [id number] Page 2 of 11 '[]'$  - 0 Dermatologic / Skin Assessment (not related to wound area) ASSESSMENTS - Focused Assessment X- 1 5 Circumferential Edema Measurements - multi extremities '[]'$  - 0 Nutritional Assessment / Counseling / Intervention '[]'$  - 0 Lower Extremity Assessment (monofilament, tuning fork, pulses) '[]'$  - 0 Peripheral Arterial Disease Assessment (using hand held doppler) ASSESSMENTS - Ostomy and/or Continence Assessment and Care '[]'$  - 0 Incontinence Assessment and Management '[]'$  - 0 Ostomy Care Assessment and Management (repouching, etc.) PROCESS - Coordination of Care X - Simple Patient / Family Education for ongoing care 1 15 '[]'$  - 0 Complex (extensive) Patient / Family Education for ongoing care '[]'$  - 0 Staff obtains Programmer, systems, Records, T Results / Process Orders est '[]'$  - 0 Staff telephones HHA, Nursing Homes / Clarify orders / etc '[]'$  - 0 Routine Transfer to another Facility (non-emergent condition) '[]'$  - 0 Routine Hospital Admission (non-emergent condition) '[]'$  - 0 New Admissions / Biomedical engineer / Ordering NPWT Apligraf, etc. , '[]'$  - 0 Emergency Hospital Admission (emergent condition) X- 1 10 Simple Discharge Coordination '[]'$  - 0 Complex (extensive) Discharge Coordination PROCESS - Special Needs '[]'$  - 0 Pediatric / Minor Patient Management '[]'$  - 0 Isolation Patient Management '[]'$  - 0 Hearing / Language / Visual special needs '[]'$  - 0 Assessment of Community assistance (transportation, D/C planning, etc.) '[]'$  - 0 Additional assistance / Altered mentation '[]'$  - 0 Support Surface(s) Assessment (bed, cushion, seat, etc.) INTERVENTIONS - Wound Cleansing / Measurement '[]'$  - 0 Simple Wound Cleansing - one wound X- 3  5 Complex Wound Cleansing - multiple wounds X- 1 5 Wound Imaging (photographs - any number of wounds) '[]'$  - 0 Wound Tracing (instead of photographs) '[]'$  -  0 Simple Wound Measurement - one wound X- 3 5 Complex Wound Measurement - multiple wounds INTERVENTIONS - Wound Dressings '[]'$  - 0 Small Wound Dressing one or multiple wounds '[]'$  - 0 Medium Wound Dressing one or multiple wounds X- 3 20 Large Wound Dressing one or multiple wounds '[]'$  - 0 Application of Medications - topical '[]'$  - 0 Application of Medications - injection INTERVENTIONS - Miscellaneous '[]'$  - 0 External ear exam '[]'$  - 0 Specimen Collection (cultures, biopsies, blood, body fluids, etc.) '[]'$  - 0 Specimen(s) / Culture(s) sent or taken to Lab for analysis Kari Medina, Kari Medina ([id number]) [id number] Page 3 of 11 '[]'$  - 0 Patient Transfer (multiple staff / Civil Service fast streamer / Similar devices) '[]'$  - 0 Simple Staple / Suture removal (25 or less) '[]'$  - 0 Complex Staple / Suture removal (26 or more) '[]'$  - 0 Hypo / Hyperglycemic Management (close monitor of Blood Glucose) '[]'$  - 0 Ankle / Brachial Index (ABI) - do not check if billed separately X- 1 5 Vital Signs Has the patient been seen at the hospital within the last three years: Yes Total Score: 160 Level Of Care: New/Established - Level 5 Electronic Signature(s) Signed: 08/28/2022 5:14:26 PM By: Massie Kluver Entered By: Massie Kluver on 08/28/2022 16:12:24 -------------------------------------------------------------------------------- Complex / Palliative Patient Assessment Details Patient Name: Date of Service: Kari Medina, Kari Medina. 08/28/2022 3:00 PM Medical Record Number: [id number] Patient Account Number: [id number] Date of Birth/Sex: Treating RN: March 14, 1946 (76 y.o. Kari Medina Primary Care Kari Medina: [name] Other Clinician: Massie Kluver Referring Allina Riches: Treating Spirit Wernli/Extender: Zachery Dakins in Treatment: 22 Complex  Wound Management Criteria Patient has remarkable or complex co-morbidities requiring medications or treatments that extend wound healing times. Examples: Diabetes mellitus with chronic renal failure or end stage renal disease requiring dialysis Advanced or poorly controlled rheumatoid arthritis Diabetes mellitus and end stage chronic obstructive pulmonary disease Active cancer with current chemo- or radiation therapy Palliative Wound Management Criteria Care Approach Wound Care Plan: Complex Wound Management Electronic Signature(s) Signed: 10/24/2022 7:32:52 AM By: Gretta Cool, BSN, RN, CWS, [name] RN, BSN Signed: 01/02/2023 1:29:37 PM By: Worthy Keeler PA-C Entered By: Gretta Cool, BSN, RN, CWS, [name] on 10/24/2022 07:32:51 -------------------------------------------------------------------------------- Encounter Discharge Information Details Patient Name: Date of Service: Arbutus Leas [name] 08/28/2022 3:00 PM Kari Medina, Kari Medina ([id number]) [id number] Page 4 of 11 Medical Record Number: [id number] Patient Account Number: [id number] Date of Birth/Sex: Treating RN: 04/07/1946 (76 y.o. Kari Medina Primary Care Orvin Netter: [name] Other Clinician: Massie Kluver Referring Deloyd Handy: Treating Abrianna Sidman/Extender: Zachery Dakins in Treatment: 22 Encounter Discharge Information Items Discharge Condition: Stable Ambulatory Status: Wheelchair Discharge Destination: Home Transportation: Private Auto Accompanied By: husband Schedule Follow-up Appointment: Yes Clinical Summary of Care: Electronic Signature(s) Signed: 08/28/2022 5:14:26 PM By: Massie Kluver Entered By: Massie Kluver on 08/28/2022 17:13:52 -------------------------------------------------------------------------------- Lower Extremity Assessment Details Patient Name: Date of Service: Kari Medina, Kari Medina. 08/28/2022 3:00 PM Medical Record Number: [id number] Patient Account Number: [id number] Date of  Birth/Sex: Treating RN: Aug 29, 1946 (76 y.o. Kari Medina Primary Care Mccauley Diehl: [name] Other Clinician: Massie Kluver Referring Talayia Hjort: Treating Dalyn Kjos/Extender: Zachery Dakins in Treatment: 22 Edema Assessment Assessed: [Left: Yes] Kari [place]: Yes] Edema: [Left: Yes] [Right: Yes] Calf Left: Right: Point of Measurement: 32 cm From Medial Instep 36.9 cm 38.3 cm Ankle Left: Right: Point of Measurement: 10 cm From Medial Instep 25.3 cm 25.4 cm Vascular Assessment Pulses: Dorsalis Pedis Palpable: [Left:Yes] [Right:Yes] Posterior Tibial Palpable: [Left:Yes] [Right:Yes] Electronic Signature(s)  Signed: 08/28/2022 4:58:49 PM By: Gretta Cool, BSN, RN, CWS, [name] RN, BSN Signed: 08/28/2022 5:14:26 PM By: Massie Kluver Entered By: Massie Kluver on 08/28/2022 15:39:17 Son, Loura Pardon ([id number]) [id number] Page 5 of 11 -------------------------------------------------------------------------------- Multi Wound Chart Details Patient Name: Date of Service: Kari Medina, Kari Medina. 08/28/2022 3:00 PM Medical Record Number: [id number] Patient Account Number: [id number] Date of Birth/Sex: Treating RN: September 29, 1946 (76 y.o. Kari Medina, [name] Primary Care Adlynn Lowenstein: [name] Other Clinician: Massie Kluver Referring Mirza Kidney: Treating Tashe Purdon/Extender: Zachery Dakins in Treatment: 70 Vital Signs Height(in): Pulse(bpm): 118 Weight(lbs): Blood Pressure(mmHg): 115/76 Body Mass Index(BMI): Temperature(F): 98.1 Respiratory Rate(breaths/min): 20 [3:Photos:] Left, Circumferential Lower Leg Right Lower Leg Left, Posterior Lower Leg Wound Location: Gradually Appeared Gradually Appeared Shear/Friction Wounding Event: Diabetic Wound/Ulcer of the Lower Diabetic Wound/Ulcer of the Lower Diabetic Wound/Ulcer of the Lower Primary Etiology: Extremity Extremity Extremity Asthma, Chronic Obstructive Asthma, Chronic Obstructive Asthma, Chronic  Obstructive Comorbid History: Pulmonary Disease (COPD), Sleep Pulmonary Disease (COPD), Sleep Pulmonary Disease (COPD), Sleep Apnea, Hypertension, Type II Apnea, Hypertension, Type II Apnea, Hypertension, Type II Diabetes, Raynauds, Osteoarthritis, Diabetes, Raynauds, Osteoarthritis, Diabetes, Raynauds, Osteoarthritis, Neuropathy Neuropathy Neuropathy 12/05/2021 05/01/2022 01/23/2022 Date Acquired: '22 17 13 '$ Weeks of Treatment: Open Open Open Wound Status: No No No Wound Recurrence: No Yes No Clustered Wound: N/A 3 N/A Clustered Quantity: 14.8x11.5x0.1 4.5x9.5x0.4 1.2x0.7x0.2 Measurements L x W x D (cm) 133.675 33.576 0.66 A (cm) : rea 13.367 13.43 0.132 Volume (cm) : 9.00% -1843.10% 14.30% % Reduction in A rea: 9.00% -7663.00% -71.40% % Reduction in Volume: Grade 2 Grade 1 Grade 2 Classification: Medium Medium Medium Exudate A mount: Serosanguineous Serosanguineous Serosanguineous Exudate Type: red, brown red, brown red, brown Exudate Color: Small (1-33%) Small (1-33%) Small (1-33%) Granulation A mount: Red, Pink Red Red Granulation Quality: Large (67-100%) Medium (34-66%) Large (67-100%) Necrotic A mount: Fat Layer (Subcutaneous Tissue): Yes Fat Layer (Subcutaneous Tissue): Yes Fat Layer (Subcutaneous Tissue): Yes Exposed Structures: Fascia: No Tendon: No Muscle: No Joint: No Bone: No None None None Epithelialization: Treatment Notes Electronic Signature(s) KYLEEANN, CREMEANS Medina ([id number]) [id number] Page 6 of 11 Signed: 08/28/2022 5:14:26 PM By: Massie Kluver Entered By: Massie Kluver on 08/28/2022 15:39:29 -------------------------------------------------------------------------------- Multi-Disciplinary Care Plan Details Patient Name: Date of Service: Arbutus Leas [name] 08/28/2022 3:00 PM Medical Record Number: [id number] Patient Account Number: [id number] Date of Birth/Sex: Treating RN: 1946-07-16 (76 y.o. Kari Medina Primary Care  Maveric Debono: [name] Other Clinician: Massie Kluver Referring Sabastien Tyler: Treating Tinamarie Przybylski/Extender: Zachery Dakins in Treatment: 22 Active Inactive Electronic Signature(s) Signed: 10/24/2022 7:31:41 AM By: Gretta Cool, BSN, RN, CWS, [name] RN, BSN Previous Signature: 08/28/2022 4:58:49 PM Version By: Gretta Cool BSN, RN, CWS, [name] RN, BSN Previous Signature: 08/28/2022 5:14:26 PM Version By: Massie Kluver Entered By: Gretta Cool BSN, RN, CWS, [name] on 10/24/2022 07:31:41 -------------------------------------------------------------------------------- Pain Assessment Details Patient Name: Date of Service: Arbutus Leas [name] 08/28/2022 3:00 PM Medical Record Number: [id number] Patient Account Number: [id number] Date of Birth/Sex: Treating RN: 06/04/1946 (76 y.o. Kari Medina Primary Care Sheniece Ruggles: [name] Other Clinician: Massie Kluver Referring Kimberlly Norgard: Treating Nicolina Hirt/Extender: Zachery Dakins in Treatment: 22 Active Problems Location of Pain Severity and Description of Pain Patient Has Paino Yes Site Locations Pain Location: Kari Medina, Kari Medina ([id number]) [id number] Page 7 of 11 Pain Location: Pain in Ulcers Duration of the Pain. Constant / Intermittento Constant Rate the pain. Current Pain Level: 7 Character of Pain Describe the Pain: Burning Pain Management and Medication Current Pain Management: Medication:  Yes Cold Application: Yes Rest: Yes Electronic Signature(s) Signed: 08/28/2022 4:58:49 PM By: Gretta Cool, BSN, RN, CWS, [name] RN, BSN Signed: 08/28/2022 5:14:26 PM By: Massie Kluver Entered By: Massie Kluver on 08/28/2022 15:20:48 -------------------------------------------------------------------------------- Patient/Caregiver Education Details Patient Name: Date of Service: Arbutus Leas [name] 9/7/2023andnbsp3:00 PM Medical Record Number: [id number] Patient Account Number: [id number] Date of Birth/Gender: Treating  RN: 10-Dec-1946 (76 y.o. Kari Medina Primary Care Physician: [name] Other Clinician: Massie Kluver Referring Physician: Treating Physician/Extender: Zachery Dakins in Treatment: 22 Education Assessment Education Provided To: Patient and Caregiver Education Topics Provided Wound/Skin Impairment: Handouts: Other: continue wound care as directed Methods: Explain/Verbal Responses: State content correctly Electronic Signature(s) Signed: 08/28/2022 5:14:26 PM By: Massie Kluver Entered By: Massie Kluver on 08/28/2022 17:13:20 Schooler, Natika Medina ([id number]) [id number] Page 8 of 11 -------------------------------------------------------------------------------- Wound Assessment Details Patient Name: Date of Service: Kari Medina, Kari Medina. 08/28/2022 3:00 PM Medical Record Number: [id number] Patient Account Number: [id number] Date of Birth/Sex: Treating RN: 1946-03-27 (76 y.o. Kari Medina, [name] Primary Care Angelika Jerrett: [name] Other Clinician: Massie Kluver Referring Peace Noyes: Treating Kaye Mitro/Extender: Zachery Dakins in Treatment: 22 Wound Status Wound Number: 3 Primary Diabetic Wound/Ulcer of the Lower Extremity Etiology: Wound Location: Left, Circumferential Lower Leg Wound Healed - Epithelialized Wounding Event: Gradually Appeared Status: Date Acquired: 12/05/2021 Comorbid Asthma, Chronic Obstructive Pulmonary Disease (COPD), Sleep Weeks Of Treatment: 22 History: Apnea, Hypertension, Type II Diabetes, Raynauds, Clustered Wound: No Osteoarthritis, Neuropathy Photos Wound Measurements Length: (cm) Width: (cm) Depth: (cm) Area: (cm) Volume: (cm) 0 % Reduction in Area: 9% 0 % Reduction in Volume: 9% 0 Epithelialization: None 0 0 Wound Description Classification: Grade 2 Exudate Amount: Medium Exudate Type: Serosanguineous Exudate Color: red, brown Foul Odor After Cleansing: No Slough/Fibrino  Yes Wound Bed Granulation Amount: Small (1-33%) Exposed Structure Granulation Quality: Red, Pink Fat Layer (Subcutaneous Tissue) Exposed: Yes Necrotic Amount: Large (67-100%) Treatment Notes Wound #3 (Lower Leg) Wound Laterality: Left, Circumferential Cleanser Normal Saline Discharge Instruction: Wash your hands with soap and water. Remove old dressing, discard into plastic bag and place into trash. Cleanse the wound with Normal Saline prior to applying a clean dressing using gauze sponges, not tissues or cotton balls. Do not scrub or use excessive force. Pat dry using gauze sponges, not tissue or cotton balls. Soap and Water Discharge Instruction: Gently cleanse wound with antibacterial soap, rinse and pat dry prior to dressing wounds Kari Medina, Kari Medina ([id number]) [id number] Page 9 of 11 Peri-Wound Care Topical Primary Dressing Silvercel 4 1/4x 4 1/4 (in/in) Discharge Instruction: Apply Silvercel 4 1/4x 4 1/4 (in/in) as instructed Secondary Dressing ABD Pad 5x9 (in/in) Discharge Instruction: Cover with ABD pad Kerlix 4.5 x 4.1 (in/yd) Discharge Instruction: Apply Kerlix 4.5 x 4.1 (in/yd) as instructed Secured With [place] H Soft Cloth Surgical T ape ape, 2x2 (in/yd) Tubigrip Size D, 3x10 (in/yd) Discharge Instruction: Apply 3 Tubigrip D 3-finger-widths below knee to base of toes to secure dressing and/or for swelling. Compression Wrap Compression Stockings Add-Ons Electronic Signature(s) Signed: 10/30/2022 5:20:15 PM By: Gretta Cool, BSN, RN, CWS, [name] RN, BSN Previous Signature: 08/28/2022 4:58:49 PM Version By: Gretta Cool, BSN, RN, CWS, [name] RN, BSN Previous Signature: 08/28/2022 5:14:26 PM Version By: Massie Kluver Entered By: Gretta Cool BSN, RN, CWS, [name] on 10/24/2022 07:30:54 -------------------------------------------------------------------------------- Wound Assessment Details Patient Name: Date of Service: Arbutus Leas [facility] 08/28/2022 3:00  PM Medical Record Number: [id number] Patient Account Number: [id number] Date of Birth/Sex: Treating RN: 1946/03/10 (76 y.o. F)  Cornell Barman Primary Care Chaquetta Schlottman: [name] Other Clinician: Massie Kluver Referring Cordale Manera: Treating Karalee Hauter/Extender: Zachery Dakins in Treatment: 22 Wound Status Wound Number: 4 Primary Diabetic Wound/Ulcer of the Lower Extremity Etiology: Wound Location: Right Lower Leg Wound Open Wounding Event: Gradually Appeared Status: Date Acquired: 05/01/2022 Comorbid Asthma, Chronic Obstructive Pulmonary Disease (COPD), Sleep Weeks Of Treatment: 17 History: Apnea, Hypertension, Type II Diabetes, Raynauds, Osteoarthritis, Clustered Wound: Yes Neuropathy Photos Kari Medina, RUIS ([id number]) [id number] Page 10 of 11 Wound Measurements Length: (cm) 4.5 Width: (cm) 9.5 Depth: (cm) 0.4 Clustered Quantity: 3 Area: (cm) 33.576 Volume: (cm) 13.43 % Reduction in Area: -1843.1% % Reduction in Volume: -7663% Epithelialization: None Wound Description Classification: Grade 1 Exudate Amount: Medium Exudate Type: Serosanguineous Exudate Color: red, brown Foul Odor After Cleansing: No Slough/Fibrino Yes Wound Bed Granulation Amount: Small (1-33%) Exposed Structure Granulation Quality: Red Fat Layer (Subcutaneous Tissue) Exposed: Yes Necrotic Amount: Medium (34-66%) Necrotic Quality: Adherent Therapist, music) Signed: 08/28/2022 4:58:49 PM By: Gretta Cool, BSN, RN, CWS, [name] RN, BSN Signed: 08/28/2022 5:14:26 PM By: Massie Kluver Entered By: Massie Kluver on 08/28/2022 15:36:41 -------------------------------------------------------------------------------- Wound Assessment Details Patient Name: Date of Service: Arbutus Leas [name] 08/28/2022 3:00 PM Medical Record Number: [id number] Patient Account Number: [id number] Date of Birth/Sex: Treating RN: 07/03/1946 (76 y.o. Kari Medina, [name] Primary Care Cameryn Chrisley:  [name] Other Clinician: Massie Kluver Referring Alam Guterrez: Treating Shaconda Hajduk/Extender: Zachery Dakins in Treatment: 22 Wound Status Wound Number: 5 Primary Diabetic Wound/Ulcer of the Lower Extremity Etiology: Wound Location: Left, Posterior Lower Leg Wound Open Wounding Event: Shear/Friction Status: Date Acquired: 01/23/2022 Comorbid Asthma, Chronic Obstructive Pulmonary Disease (COPD), Sleep Weeks Of Treatment: 13 History: Apnea, Hypertension, Type II Diabetes, Raynauds, Clustered Wound: No Osteoarthritis, Neuropathy Photos Wound Measurements Length: (cm) 1.2 Width: (cm) 0.7 Landress, Olamide Medina ([id number]) Depth: (cm) 0.2 Area: (cm) 0.6 Volume: (cm) 0.1 % Reduction in Area: 14.3% % Reduction in Volume: -71.4% [id number] Page 11 of 11 Epithelialization: None 6 32 Wound Description Classification: Grade 2 Exudate Amount: Medium Exudate Type: Serosanguineous Exudate Color: red, brown Foul Odor After Cleansing: No Slough/Fibrino Yes Wound Bed Granulation Amount: Small (1-33%) Exposed Structure Granulation Quality: Red Fascia Exposed: No Necrotic Amount: Large (67-100%) Fat Layer (Subcutaneous Tissue) Exposed: Yes Necrotic Quality: Adherent Slough Tendon Exposed: No Muscle Exposed: No Joint Exposed: No Bone Exposed: No Electronic Signature(s) Signed: 08/28/2022 4:58:49 PM By: Gretta Cool, BSN, RN, CWS, [name] RN, BSN Signed: 08/28/2022 5:14:26 PM By: Massie Kluver Entered By: Massie Kluver on 08/28/2022 15:37:12 -------------------------------------------------------------------------------- Vitals Details Patient Name: Date of Service: Arbutus Leas [name] 08/28/2022 3:00 PM Medical Record Number: [id number] Patient Account Number: [id number] Date of Birth/Sex: Treating RN: 01-Feb-1946 (76 y.o. Kari Medina Primary Care Sophiea Ueda: [name] Other Clinician: Massie Kluver Referring Katricia Prehn: Treating Rey Fors/Extender:  Zachery Dakins in Treatment: 22 Vital Signs Time Taken: 15:13 Temperature (F): 98.1 Pulse (bpm): 118 Respiratory Rate (breaths/min): 20 Blood Pressure (mmHg): 115/76 Reference Range: 80 - 120 mg / dl Electronic Signature(s) Signed: 08/28/2022 5:14:26 PM By: Massie Kluver Entered By: Massie Kluver on 08/28/2022 15:20:44

## 2022-09-01 ENCOUNTER — Ambulatory Visit: Payer: Medicare Other | Admitting: Internal Medicine

## 2022-09-01 DIAGNOSIS — E11622 Type 2 diabetes mellitus with other skin ulcer: Secondary | ICD-10-CM | POA: Diagnosis not present

## 2022-09-01 DIAGNOSIS — J449 Chronic obstructive pulmonary disease, unspecified: Secondary | ICD-10-CM | POA: Diagnosis not present

## 2022-09-01 DIAGNOSIS — L97822 Non-pressure chronic ulcer of other part of left lower leg with fat layer exposed: Secondary | ICD-10-CM | POA: Diagnosis not present

## 2022-09-01 DIAGNOSIS — I890 Lymphedema, not elsewhere classified: Secondary | ICD-10-CM | POA: Diagnosis not present

## 2022-09-01 DIAGNOSIS — L97812 Non-pressure chronic ulcer of other part of right lower leg with fat layer exposed: Secondary | ICD-10-CM | POA: Diagnosis not present

## 2022-09-01 DIAGNOSIS — I10 Essential (primary) hypertension: Secondary | ICD-10-CM | POA: Diagnosis not present

## 2022-09-07 DIAGNOSIS — I890 Lymphedema, not elsewhere classified: Secondary | ICD-10-CM | POA: Diagnosis not present

## 2022-09-07 DIAGNOSIS — L97822 Non-pressure chronic ulcer of other part of left lower leg with fat layer exposed: Secondary | ICD-10-CM | POA: Diagnosis not present

## 2022-09-07 DIAGNOSIS — M1990 Unspecified osteoarthritis, unspecified site: Secondary | ICD-10-CM | POA: Diagnosis not present

## 2022-09-07 DIAGNOSIS — I10 Essential (primary) hypertension: Secondary | ICD-10-CM | POA: Diagnosis not present

## 2022-09-07 DIAGNOSIS — E1151 Type 2 diabetes mellitus with diabetic peripheral angiopathy without gangrene: Secondary | ICD-10-CM | POA: Diagnosis not present

## 2022-09-07 DIAGNOSIS — Z87891 Personal history of nicotine dependence: Secondary | ICD-10-CM | POA: Diagnosis not present

## 2022-09-07 DIAGNOSIS — G4730 Sleep apnea, unspecified: Secondary | ICD-10-CM | POA: Diagnosis not present

## 2022-09-07 DIAGNOSIS — M797 Fibromyalgia: Secondary | ICD-10-CM | POA: Diagnosis not present

## 2022-09-07 DIAGNOSIS — J449 Chronic obstructive pulmonary disease, unspecified: Secondary | ICD-10-CM | POA: Diagnosis not present

## 2022-09-07 DIAGNOSIS — L97812 Non-pressure chronic ulcer of other part of right lower leg with fat layer exposed: Secondary | ICD-10-CM | POA: Diagnosis not present

## 2022-09-07 DIAGNOSIS — E1140 Type 2 diabetes mellitus with diabetic neuropathy, unspecified: Secondary | ICD-10-CM | POA: Diagnosis not present

## 2022-09-07 DIAGNOSIS — E11622 Type 2 diabetes mellitus with other skin ulcer: Secondary | ICD-10-CM | POA: Diagnosis not present

## 2022-09-07 DIAGNOSIS — E039 Hypothyroidism, unspecified: Secondary | ICD-10-CM | POA: Diagnosis not present

## 2022-09-07 DIAGNOSIS — Z7951 Long term (current) use of inhaled steroids: Secondary | ICD-10-CM | POA: Diagnosis not present

## 2022-09-07 DIAGNOSIS — I2729 Other secondary pulmonary hypertension: Secondary | ICD-10-CM | POA: Diagnosis not present

## 2022-09-07 DIAGNOSIS — Z7984 Long term (current) use of oral hypoglycemic drugs: Secondary | ICD-10-CM | POA: Diagnosis not present

## 2022-09-07 DIAGNOSIS — Z87440 Personal history of urinary (tract) infections: Secondary | ICD-10-CM | POA: Diagnosis not present

## 2022-09-07 DIAGNOSIS — Z9981 Dependence on supplemental oxygen: Secondary | ICD-10-CM | POA: Diagnosis not present

## 2022-09-09 DIAGNOSIS — G4733 Obstructive sleep apnea (adult) (pediatric): Secondary | ICD-10-CM | POA: Diagnosis not present

## 2022-09-09 DIAGNOSIS — I2722 Pulmonary hypertension due to left heart disease: Secondary | ICD-10-CM | POA: Diagnosis not present

## 2022-09-09 DIAGNOSIS — I10 Essential (primary) hypertension: Secondary | ICD-10-CM | POA: Diagnosis not present

## 2022-09-09 DIAGNOSIS — I7121 Aneurysm of the ascending aorta, without rupture: Secondary | ICD-10-CM | POA: Diagnosis not present

## 2022-09-09 DIAGNOSIS — R0602 Shortness of breath: Secondary | ICD-10-CM | POA: Diagnosis not present

## 2022-09-09 DIAGNOSIS — I25118 Atherosclerotic heart disease of native coronary artery with other forms of angina pectoris: Secondary | ICD-10-CM | POA: Diagnosis not present

## 2022-09-12 DIAGNOSIS — L97812 Non-pressure chronic ulcer of other part of right lower leg with fat layer exposed: Secondary | ICD-10-CM | POA: Diagnosis not present

## 2022-09-12 DIAGNOSIS — J449 Chronic obstructive pulmonary disease, unspecified: Secondary | ICD-10-CM | POA: Diagnosis not present

## 2022-09-12 DIAGNOSIS — L97822 Non-pressure chronic ulcer of other part of left lower leg with fat layer exposed: Secondary | ICD-10-CM | POA: Diagnosis not present

## 2022-09-12 DIAGNOSIS — I10 Essential (primary) hypertension: Secondary | ICD-10-CM | POA: Diagnosis not present

## 2022-09-12 DIAGNOSIS — E11622 Type 2 diabetes mellitus with other skin ulcer: Secondary | ICD-10-CM | POA: Diagnosis not present

## 2022-09-12 DIAGNOSIS — I890 Lymphedema, not elsewhere classified: Secondary | ICD-10-CM | POA: Diagnosis not present

## 2022-09-19 DIAGNOSIS — L97812 Non-pressure chronic ulcer of other part of right lower leg with fat layer exposed: Secondary | ICD-10-CM | POA: Diagnosis not present

## 2022-09-19 DIAGNOSIS — J449 Chronic obstructive pulmonary disease, unspecified: Secondary | ICD-10-CM | POA: Diagnosis not present

## 2022-09-19 DIAGNOSIS — E11622 Type 2 diabetes mellitus with other skin ulcer: Secondary | ICD-10-CM | POA: Diagnosis not present

## 2022-09-19 DIAGNOSIS — L97822 Non-pressure chronic ulcer of other part of left lower leg with fat layer exposed: Secondary | ICD-10-CM | POA: Diagnosis not present

## 2022-09-19 DIAGNOSIS — I10 Essential (primary) hypertension: Secondary | ICD-10-CM | POA: Diagnosis not present

## 2022-09-19 DIAGNOSIS — I890 Lymphedema, not elsewhere classified: Secondary | ICD-10-CM | POA: Diagnosis not present

## 2022-09-24 DIAGNOSIS — M659 Synovitis and tenosynovitis, unspecified: Secondary | ICD-10-CM | POA: Diagnosis not present

## 2022-09-24 DIAGNOSIS — L600 Ingrowing nail: Secondary | ICD-10-CM | POA: Diagnosis not present

## 2022-09-24 DIAGNOSIS — E1140 Type 2 diabetes mellitus with diabetic neuropathy, unspecified: Secondary | ICD-10-CM | POA: Diagnosis not present

## 2022-09-24 DIAGNOSIS — R600 Localized edema: Secondary | ICD-10-CM | POA: Diagnosis not present

## 2022-09-24 DIAGNOSIS — B351 Tinea unguium: Secondary | ICD-10-CM | POA: Diagnosis not present

## 2022-09-25 ENCOUNTER — Ambulatory Visit: Payer: Medicare Other | Admitting: Physician Assistant

## 2022-09-26 DIAGNOSIS — L97822 Non-pressure chronic ulcer of other part of left lower leg with fat layer exposed: Secondary | ICD-10-CM | POA: Diagnosis not present

## 2022-09-26 DIAGNOSIS — I890 Lymphedema, not elsewhere classified: Secondary | ICD-10-CM | POA: Diagnosis not present

## 2022-09-26 DIAGNOSIS — I10 Essential (primary) hypertension: Secondary | ICD-10-CM | POA: Diagnosis not present

## 2022-09-26 DIAGNOSIS — J449 Chronic obstructive pulmonary disease, unspecified: Secondary | ICD-10-CM | POA: Diagnosis not present

## 2022-09-26 DIAGNOSIS — E11622 Type 2 diabetes mellitus with other skin ulcer: Secondary | ICD-10-CM | POA: Diagnosis not present

## 2022-09-26 DIAGNOSIS — L97812 Non-pressure chronic ulcer of other part of right lower leg with fat layer exposed: Secondary | ICD-10-CM | POA: Diagnosis not present

## 2022-09-30 ENCOUNTER — Ambulatory Visit: Payer: Medicare Other | Admitting: Physician Assistant

## 2022-09-30 ENCOUNTER — Telehealth: Payer: Self-pay | Admitting: Internal Medicine

## 2022-09-30 NOTE — Telephone Encounter (Signed)
I spoke with pt; pt said she has pulmonary hypertension which can affect her feet. Pt said on and off for 1 month pts feet are red, swollen,and has been hurting. The pain worsened on 09/29/22. Today pt said it is painful to try to bear weight on her feet. No redness or pain or swelling in lower legs. Pt said she only wants to see Dr Silvio Pate. I advised pt Dr Silvio Pate is out of office until 10/06/22 and that is too long to wait to be seen. Access nurse disposition was ED but pt refused. Offered pt an appt at [facility] with Dr Damita Dunnings 09/30/22 at 2 PM. Pt said she was not going anywhere today and she would cb if she decides she wants to schedule appt. I explained that pt needed to be seen especially since pt is diabetic. Pt appreciated advice but she will wait and see how she does and pt will cb if wants appt. Sending note to Dr Danise Mina who is in office and [facility] CMA and will teams [place].

## 2022-09-30 NOTE — Telephone Encounter (Signed)
Patient called in stating that she is having issues with her feet and she is a diabetic. She stated they are red, swollen, and hurting to the point she can't walk. Sent over to access nurse.

## 2022-09-30 NOTE — Telephone Encounter (Signed)
[facility] - Client TELEPHONE ADVICE RECORD [facility] Patient Name: Kari Medina Gender: Female DOB: 02/01/1946 Age: 77 Y 2 M 2 D Return Phone Number: [phone] (Primary) Address: City/ State/ Zip:  [place]  [address] Client [facility] - Client Client Site [place] - Day Provider [name]- MD Contact Type Call Who Is Calling Patient / Member / Family / Caregiver Call Type Triage / Clinical Relationship To Patient Self Return Phone Number [phone] (Primary) Chief Complaint Feet swelling Reason for Call Symptomatic / Request for [place] states that her feet are red and swollen, she is not able to walk on them at this time. She does have pulmonary hypertension as well. Translation No Nurse Assessment Nurse: Clovis Riley, RN, Georgina Peer Date/Time (Eastern Time): 09/30/2022 10:08:27 AM Confirm and document reason for call. If symptomatic, describe symptoms. ---Caller states that her feet are red and swollen, she is not able to walk on them at this time. She does have pulmonary hypertension as well. Does the patient have any new or worsening symptoms? ---Yes Will a triage be completed? ---Yes Related visit to physician within the last 2 weeks? ---No Does the PT have any chronic conditions? (i.e. diabetes, asthma, this includes High risk factors for pregnancy, etc.) ---Yes List chronic conditions. ---pulmonary hypertension, diabetes, arthritis Is this a behavioral health or substance abuse call? ---No Guidelines Guideline Title Affirmed Question Affirmed Notes Nurse Date/Time [name] Time) Diabetes - Foot Problems and Questions Severe pain [place], RN, Georgina Peer 09/30/2022 10:10:14 AM Disp. Time [name] Time) Disposition Final User 09/30/2022 10:11:48 AM Go to ED Now Yes Clovis Riley, RN, Georgina Peer Final Disposition 09/30/2022 10:11:48 AM Go to ED Now  Yes Clovis Riley, RN, Roslynn Amble NOTE: All timestamps contained within this report are represented as [place] Standard Time. CONFIDENTIALTY NOTICE: This fax transmission is intended only for the addressee. It contains information that is legally privileged, confidential or otherwise protected from use or disclosure. If you are not the intended recipient, you are strictly prohibited from reviewing, disclosing, copying using or disseminating any of this information or taking any action in reliance on or regarding this information. If you have received this fax in error, please notify us immediately by telephone so that we can arrange for its return to [place]. Phone: [phone], Toll-Free: [phone], Fax: [phone] Page: 2 of 2 Call Id: [id number] Caller Disagree/Comply Disagree Caller Understands Yes PreDisposition InappropriateToAsk Care Advice Given Per Guideline * You need to be seen in the Emergency Department. GO TO ED NOW: * Go to the ED at ___________ [place] now. Drive carefully. NOTE TO TRIAGER - DRIVING: * Another adult should drive. * Patient should not delay going to the emergency department. PAIN MEDICINES: * For pain relief, you can take either acetaminophen, ibuprofen, or naproxen. * ACETAMINOPHEN - REGULAR STRENGTH TYLENOL: Take 650 mg (two 325 mg pills) by mouth every 4 to 6 hours as needed. Each Regular Strength Tylenol pill has 325 mg of acetaminophen. The most you should take is 10 pills a day (3,250 mg total). Note: In [place], the maximum is 12 pills a day (3,900 mg total). CARE ADVICE given per Diabetes - Foot Problems and Questions (Adult) guideline. Referrals GO TO FACILITY REFUSE

## 2022-09-30 NOTE — Telephone Encounter (Signed)
This can be addressed by Dr Silvio Pate when he sees his messages since she declines coming in the office.

## 2022-09-30 NOTE — Telephone Encounter (Signed)
Left message on VM per DPR with Dr Alla German instructions.

## 2022-10-02 DIAGNOSIS — E11622 Type 2 diabetes mellitus with other skin ulcer: Secondary | ICD-10-CM | POA: Diagnosis not present

## 2022-10-02 DIAGNOSIS — I890 Lymphedema, not elsewhere classified: Secondary | ICD-10-CM | POA: Diagnosis not present

## 2022-10-02 DIAGNOSIS — J449 Chronic obstructive pulmonary disease, unspecified: Secondary | ICD-10-CM | POA: Diagnosis not present

## 2022-10-02 DIAGNOSIS — L97812 Non-pressure chronic ulcer of other part of right lower leg with fat layer exposed: Secondary | ICD-10-CM | POA: Diagnosis not present

## 2022-10-02 DIAGNOSIS — I10 Essential (primary) hypertension: Secondary | ICD-10-CM | POA: Diagnosis not present

## 2022-10-02 DIAGNOSIS — L97822 Non-pressure chronic ulcer of other part of left lower leg with fat layer exposed: Secondary | ICD-10-CM | POA: Diagnosis not present

## 2022-10-08 ENCOUNTER — Ambulatory Visit (INDEPENDENT_AMBULATORY_CARE_PROVIDER_SITE_OTHER): Payer: Medicare Other | Admitting: Internal Medicine

## 2022-10-08 ENCOUNTER — Encounter: Payer: Self-pay | Admitting: Internal Medicine

## 2022-10-08 ENCOUNTER — Telehealth: Payer: Self-pay | Admitting: Internal Medicine

## 2022-10-08 DIAGNOSIS — M159 Polyosteoarthritis, unspecified: Secondary | ICD-10-CM | POA: Diagnosis not present

## 2022-10-08 MED ORDER — HYDROCODONE-ACETAMINOPHEN 5-325 MG PO TABS: 0.5000 | ORAL_TABLET | Freq: Three times a day (TID) | ORAL | 0 refills | Status: DC | PRN

## 2022-10-08 NOTE — Assessment & Plan Note (Signed)
She is concerned about worsening swelling---but RA unlikely Limited mobility now--even worse Didn't get relief from past tramadol Using tylenol Could consider topical diclofenac--but I would like to examine the areas first Will Rx small amount hydrocodone and plan for home visit in about 2 weeks  16 minutes for phone call

## 2022-10-08 NOTE — Telephone Encounter (Signed)
Patient was scheduled to come in today at 1015 to see Dr Silvio Pate. Husband called in and requested a phone call from shannon to let her know what's going on with her,because she cant walk,so she cant make her appointment today.

## 2022-10-08 NOTE — Telephone Encounter (Signed)
Dr Silvio Pate did phone call visit with pt

## 2022-10-08 NOTE — Progress Notes (Signed)
Subjective:    Patient ID: Kari Medina, female    DOB: Feb 05, 1946, 76 y.o.   MRN: [id number]  HPI Phone virtual visit for review of ongoing pain issues No video technology available Identification done Reviewed limitations and billing and she gave consent Participants--patient in her home and I am in my office  Had been looking on line for suggestions to improve things Decided to try some lemon water Having some hand swelling as well as in her feet---started with wrists Trouble even picking things up Husband wrapped her wrist with gauze--may have helped---but then her hands "acted weird" Had to cut her ring off due to swelling  Some pain in shoulder also Ankles painful---hard to put her weight on them Rolls around house on a rolling device  Has tried tylenol--not really too helpful  Current Outpatient Medications on File Prior to Visit  Medication Sig Dispense Refill   acetaminophen (TYLENOL) 650 MG CR tablet Take 650 mg by mouth every 8 (eight) hours as needed for pain.     albuterol (PROVENTIL HFA) 108 (90 Base) MCG/ACT inhaler Inhale 2 puffs into the lungs every 6 (six) hours as needed for wheezing or shortness of breath. 18 g 2   ALPRAZolam (XANAX) 0.25 MG tablet Take 1 tablet (0.25 mg total) by mouth 2 (two) times daily as needed for anxiety. 30 tablet 0   aspirin EC 81 MG tablet Take 81 mg by mouth 2 (two) times daily.     budesonide-formoterol (SYMBICORT) 160-4.5 MCG/ACT inhaler Inhale 2 puffs into the lungs 2 (two) times daily. 3 each 1   DULoxetine (CYMBALTA) 60 MG capsule TAKE 1 CAPSULE BY MOUTH  DAILY 90 capsule 3   glucose blood (ONETOUCH VERIO) test strip Use to check blood sugar once a day. E11.9 100 each 3   ipratropium-albuterol (DUONEB) 0.5-2.5 (3) MG/3ML SOLN Inhale 3 mLs into the lungs 4 (four) times daily as needed.     levothyroxine (SYNTHROID) 200 MCG tablet TAKE 1 TABLET BY MOUTH DAILY  BEFORE BREAKFAST 90 tablet 3   metFORMIN (GLUCOPHAGE-XR) 500  MG 24 hr tablet TAKE 1 TABLET BY MOUTH  DAILY WITH BREAKFAST 90 tablet 3   montelukast (SINGULAIR) 10 MG tablet TAKE 1 TABLET BY MOUTH AT  BEDTIME 90 tablet 3   ONETOUCH DELICA LANCETS FINE MISC 1 each by Does not apply route daily. Use to check blood sugar once a day. E11.9 100 each 3   rOPINIRole (REQUIP) 3 MG tablet Take 1 tablet (3 mg total) by mouth at bedtime. 90 tablet 3   spironolactone (ALDACTONE) 25 MG tablet Take 1 tablet (25 mg total) by mouth daily. 90 tablet 1   torsemide (DEMADEX) 20 MG tablet Take 1 tablet (20 mg total) by mouth 2 (two) times daily. Hold 2nd dose if home weight under 190# 60 tablet 5   calcium carbonate (OS-CAL) 600 MG TABS Take 600 mg by mouth daily with breakfast. (Patient not taking: Reported on 08/18/2022)     ketoconazole (NIZORAL) 2 % cream Apply 1 application  topically daily. (Patient not taking: Reported on 10/08/2022) 45 g 3   No current facility-administered medications on file prior to visit.    Allergies  Allergen Reactions   Latex Hives    Tape and bandaids only   Nickel Dermatitis   Pramipexole Other (See Comments)    Caused hallucinations, says she can take name brand.   Prednisone     Makes her "feel crazy" - can take low dosage  Topiramate Other (See Comments)    "spaced out"   Citalopram Anxiety   Paroxetine Hcl Anxiety   Sertraline Anxiety    Past Medical History:  Diagnosis Date   Asthma    Chronic hypoxemic respiratory failure (HCC)    uses O2 with exertion and with CPAP   COPD (chronic obstructive pulmonary disease) (HCC)    Depression    Diabetes mellitus without complication (HCC)    Dyspnea    doe   Dysrhythmia    extra beat   Fatty liver    Fibromyalgia    Generalized osteoarthritis of multiple sites    History of hiatal hernia    Hypertension    Hypothyroidism    Melanoma ([place]) 07/2018   Right leg   OSA (obstructive sleep apnea)    Pain    chronic ruq and back pain   Panic attacks    PONV (postoperative  nausea and vomiting)    after thyroidectomy   Raynaud disease    RLS (restless legs syndrome)    Spleen absent    TOLD ABSENT THEN TOLD DOES HAVE SPLEEN. PATIENT IS UNCERTAIN   Tremor, essential    Wears dentures    full upper and lower    Past Surgical History:  Procedure Laterality Date   CATARACT EXTRACTION W/PHACO Right 03/04/2021   Procedure: CATARACT EXTRACTION PHACO AND INTRAOCULAR LENS PLACEMENT (IOC) RIGHT DIABETIC 3.98 00:33.2;  Surgeon: Eulogio Bear, MD;  Location: [facility];  Service: Ophthalmology;  Laterality: Right;  Diabetic - oral meds   CATARACT EXTRACTION W/PHACO Left 03/18/2021   Procedure: CATARACT EXTRACTION PHACO AND INTRAOCULAR LENS PLACEMENT ([place]) LEFT;  Surgeon: Eulogio Bear, MD;  Location: [facility];  Service: Ophthalmology;  Laterality: Left;  2.96 0:27.9   CHOLECYSTECTOMY     DILATATION & CURETTAGE/HYSTEROSCOPY WITH MYOSURE N/A 11/10/2018   Procedure: DILATATION & CURETTAGE/HYSTEROSCOPY WITH MYOSURE/MYOMECTOMY;  Surgeon: Aletha Halim, MD;  Location: [facility];  Service: Gynecology;  Laterality: N/A;  possible myosure.  Please use myosure scope, do not open myosure blades but have in the room   JOINT REPLACEMENT Bilateral    2008/2011   MELANOMA EXCISION  07/2018   REVERSE SHOULDER ARTHROPLASTY Right 05/28/2017   Procedure: REVERSE SHOULDER ARTHROPLASTY;  Surgeon: Corky Mull, MD;  Location: [facility];  Service: Orthopedics;  Laterality: Right;   RHINOPLASTY  1972   RIGHT HEART CATH N/A 12/27/2021   Procedure: RIGHT HEART CATH;  Surgeon: Andrez Grime, MD;  Location: [facility];  Service: Cardiovascular;  Laterality: N/A;   THYROIDECTOMY  2006   TOTAL KNEE ARTHROPLASTY     bilateral    Family History  Problem Relation Age of Onset   Osteoarthritis Mother    Diabetes Mother    Cirrhosis Mother    Cancer Father    Kidney cancer Father    Bladder Cancer Father    Heart disease Brother         stents in 1 brother   Breast cancer Neg Hx     Social History   Socioeconomic History   Marital status: Married    Spouse name: Not on file   Number of children: 1   Years of education: Not on file   Highest education level: Not on file  Occupational History   Occupation: Neurosurgeon    Comment: Retired  Tobacco Use   Smoking status: Former    Packs/day: 0.25    Years: 5.00  Total pack years: 1.25    Types: Cigarettes    Quit date: 12/22/1988    Years since quitting: 33.8   Smokeless tobacco: Never  Vaping Use   Vaping Use: Never used  Substance and Sexual Activity   Alcohol use: No   Drug use: No   Sexual activity: Not on file  Other Topics Concern   Not on file  Social History Narrative   1 daughter      Has living will   Husband has health care POA. Alternate would be daughter Judson Roch   Would allow resuscitation but no prolonged machines   Not sure about feeding tubes   Social Determinants of Health   Financial Resource Strain: Not on file  Food Insecurity: Not on file  Transportation Needs: Not on file  Physical Activity: Not on file  Stress: Not on file  Social Connections: Not on file  Intimate Partner Violence: Not on file    Review of Systems Has high bed--trouble getting into it Slept on couch last night---couldn't make it to bed. Did better there Still uses CPAP Legs are better--only 1 small remaining ulcer    Objective:   Physical Exam         Assessment & Plan:

## 2022-10-09 ENCOUNTER — Observation Stay: Payer: Medicare Other

## 2022-10-09 ENCOUNTER — Encounter: Payer: Self-pay | Admitting: Emergency Medicine

## 2022-10-09 ENCOUNTER — Inpatient Hospital Stay
Admission: EM | Admit: 2022-10-09 | Discharge: 2022-10-13 | DRG: 603 | Disposition: A | Discharge status: Home Health | Payer: Medicare Other | Attending: Internal Medicine | Admitting: Internal Medicine

## 2022-10-09 ENCOUNTER — Emergency Department: Payer: Medicare Other

## 2022-10-09 ENCOUNTER — Other Ambulatory Visit: Payer: Self-pay

## 2022-10-09 DIAGNOSIS — L97929 Non-pressure chronic ulcer of unspecified part of left lower leg with unspecified severity: Secondary | ICD-10-CM | POA: Diagnosis present

## 2022-10-09 DIAGNOSIS — M7989 Other specified soft tissue disorders: Secondary | ICD-10-CM | POA: Diagnosis not present

## 2022-10-09 DIAGNOSIS — F32A Depression, unspecified: Secondary | ICD-10-CM | POA: Diagnosis present

## 2022-10-09 DIAGNOSIS — E890 Postprocedural hypothyroidism: Secondary | ICD-10-CM | POA: Diagnosis present

## 2022-10-09 DIAGNOSIS — N1831 Chronic kidney disease, stage 3a: Secondary | ICD-10-CM | POA: Diagnosis present

## 2022-10-09 DIAGNOSIS — J9611 Chronic respiratory failure with hypoxia: Secondary | ICD-10-CM | POA: Diagnosis not present

## 2022-10-09 DIAGNOSIS — A419 Sepsis, unspecified organism: Secondary | ICD-10-CM | POA: Diagnosis not present

## 2022-10-09 DIAGNOSIS — Z87891 Personal history of nicotine dependence: Secondary | ICD-10-CM

## 2022-10-09 DIAGNOSIS — Z7982 Long term (current) use of aspirin: Secondary | ICD-10-CM

## 2022-10-09 DIAGNOSIS — F419 Anxiety disorder, unspecified: Secondary | ICD-10-CM | POA: Diagnosis present

## 2022-10-09 DIAGNOSIS — J449 Chronic obstructive pulmonary disease, unspecified: Secondary | ICD-10-CM | POA: Diagnosis present

## 2022-10-09 DIAGNOSIS — E039 Hypothyroidism, unspecified: Secondary | ICD-10-CM | POA: Diagnosis present

## 2022-10-09 DIAGNOSIS — K760 Fatty (change of) liver, not elsewhere classified: Secondary | ICD-10-CM | POA: Diagnosis present

## 2022-10-09 DIAGNOSIS — M19041 Primary osteoarthritis, right hand: Secondary | ICD-10-CM | POA: Diagnosis present

## 2022-10-09 DIAGNOSIS — L97919 Non-pressure chronic ulcer of unspecified part of right lower leg with unspecified severity: Secondary | ICD-10-CM | POA: Diagnosis present

## 2022-10-09 DIAGNOSIS — E063 Autoimmune thyroiditis: Secondary | ICD-10-CM

## 2022-10-09 DIAGNOSIS — E11622 Type 2 diabetes mellitus with other skin ulcer: Secondary | ICD-10-CM | POA: Diagnosis present

## 2022-10-09 DIAGNOSIS — G4733 Obstructive sleep apnea (adult) (pediatric): Secondary | ICD-10-CM | POA: Diagnosis present

## 2022-10-09 DIAGNOSIS — Z7984 Long term (current) use of oral hypoglycemic drugs: Secondary | ICD-10-CM | POA: Diagnosis not present

## 2022-10-09 DIAGNOSIS — L03113 Cellulitis of right upper limb: Principal | ICD-10-CM | POA: Diagnosis present

## 2022-10-09 DIAGNOSIS — Z91048 Other nonmedicinal substance allergy status: Secondary | ICD-10-CM

## 2022-10-09 DIAGNOSIS — Z8249 Family history of ischemic heart disease and other diseases of the circulatory system: Secondary | ICD-10-CM

## 2022-10-09 DIAGNOSIS — L0390 Cellulitis, unspecified: Secondary | ICD-10-CM | POA: Diagnosis present

## 2022-10-09 DIAGNOSIS — L03011 Cellulitis of right finger: Secondary | ICD-10-CM | POA: Diagnosis not present

## 2022-10-09 DIAGNOSIS — M797 Fibromyalgia: Secondary | ICD-10-CM | POA: Diagnosis present

## 2022-10-09 DIAGNOSIS — Z79890 Hormone replacement therapy (postmenopausal): Secondary | ICD-10-CM | POA: Diagnosis not present

## 2022-10-09 DIAGNOSIS — M1000 Idiopathic gout, unspecified site: Secondary | ICD-10-CM

## 2022-10-09 DIAGNOSIS — M79601 Pain in right arm: Secondary | ICD-10-CM | POA: Diagnosis present

## 2022-10-09 DIAGNOSIS — I509 Heart failure, unspecified: Secondary | ICD-10-CM | POA: Diagnosis not present

## 2022-10-09 DIAGNOSIS — G250 Essential tremor: Secondary | ICD-10-CM | POA: Diagnosis not present

## 2022-10-09 DIAGNOSIS — I272 Pulmonary hypertension, unspecified: Secondary | ICD-10-CM | POA: Diagnosis present

## 2022-10-09 DIAGNOSIS — I83009 Varicose veins of unspecified lower extremity with ulcer of unspecified site: Secondary | ICD-10-CM | POA: Diagnosis present

## 2022-10-09 DIAGNOSIS — Z96653 Presence of artificial knee joint, bilateral: Secondary | ICD-10-CM | POA: Diagnosis present

## 2022-10-09 DIAGNOSIS — Z1152 Encounter for screening for COVID-19: Secondary | ICD-10-CM

## 2022-10-09 DIAGNOSIS — G2581 Restless legs syndrome: Secondary | ICD-10-CM | POA: Diagnosis not present

## 2022-10-09 DIAGNOSIS — Z79899 Other long term (current) drug therapy: Secondary | ICD-10-CM

## 2022-10-09 DIAGNOSIS — I10 Essential (primary) hypertension: Secondary | ICD-10-CM | POA: Diagnosis not present

## 2022-10-09 DIAGNOSIS — I110 Hypertensive heart disease with heart failure: Secondary | ICD-10-CM | POA: Diagnosis not present

## 2022-10-09 DIAGNOSIS — R609 Edema, unspecified: Secondary | ICD-10-CM | POA: Diagnosis not present

## 2022-10-09 DIAGNOSIS — I5021 Acute systolic (congestive) heart failure: Secondary | ICD-10-CM

## 2022-10-09 DIAGNOSIS — I129 Hypertensive chronic kidney disease with stage 1 through stage 4 chronic kidney disease, or unspecified chronic kidney disease: Secondary | ICD-10-CM | POA: Diagnosis present

## 2022-10-09 DIAGNOSIS — R0600 Dyspnea, unspecified: Secondary | ICD-10-CM | POA: Diagnosis not present

## 2022-10-09 DIAGNOSIS — R6520 Severe sepsis without septic shock: Secondary | ICD-10-CM | POA: Diagnosis not present

## 2022-10-09 DIAGNOSIS — Z91041 Radiographic dye allergy status: Secondary | ICD-10-CM

## 2022-10-09 DIAGNOSIS — Z85820 Personal history of malignant melanoma of skin: Secondary | ICD-10-CM

## 2022-10-09 DIAGNOSIS — E038 Other specified hypothyroidism: Secondary | ICD-10-CM | POA: Diagnosis not present

## 2022-10-09 DIAGNOSIS — E1122 Type 2 diabetes mellitus with diabetic chronic kidney disease: Secondary | ICD-10-CM | POA: Diagnosis present

## 2022-10-09 DIAGNOSIS — Z96611 Presence of right artificial shoulder joint: Secondary | ICD-10-CM | POA: Diagnosis present

## 2022-10-09 DIAGNOSIS — I7300 Raynaud's syndrome without gangrene: Secondary | ICD-10-CM | POA: Diagnosis not present

## 2022-10-09 DIAGNOSIS — Z23 Encounter for immunization: Secondary | ICD-10-CM

## 2022-10-09 DIAGNOSIS — Z7951 Long term (current) use of inhaled steroids: Secondary | ICD-10-CM

## 2022-10-09 DIAGNOSIS — R079 Chest pain, unspecified: Secondary | ICD-10-CM | POA: Diagnosis not present

## 2022-10-09 DIAGNOSIS — Z9981 Dependence on supplemental oxygen: Secondary | ICD-10-CM

## 2022-10-09 DIAGNOSIS — Z888 Allergy status to other drugs, medicaments and biological substances status: Secondary | ICD-10-CM

## 2022-10-09 DIAGNOSIS — R0789 Other chest pain: Secondary | ICD-10-CM | POA: Diagnosis not present

## 2022-10-09 LAB — CBC WITH DIFFERENTIAL/PLATELET
Abs Immature Granulocytes: 0.06 10*3/uL (ref 0.00–0.07)
Basophils Absolute: 0 10*3/uL (ref 0.0–0.1)
Basophils Relative: 0 %
Eosinophils Absolute: 0 10*3/uL (ref 0.0–0.5)
Eosinophils Relative: 0 %
HCT: 43.4 % (ref 36.0–46.0)
Hemoglobin: 14.4 g/dL (ref 12.0–15.0)
Immature Granulocytes: 1 %
Lymphocytes Relative: 8 %
Lymphs Abs: 1 10*3/uL (ref 0.7–4.0)
MCH: 31.8 pg (ref 26.0–34.0)
MCHC: 33.2 g/dL (ref 30.0–36.0)
MCV: 95.8 fL (ref 80.0–100.0)
Monocytes Absolute: 1.8 10*3/uL — ABNORMAL HIGH (ref 0.1–1.0)
Monocytes Relative: 14 %
Neutro Abs: 9.8 10*3/uL — ABNORMAL HIGH (ref 1.7–7.7)
Neutrophils Relative %: 77 %
Platelets: 142 10*3/uL — ABNORMAL LOW (ref 150–400)
RBC: 4.53 MIL/uL (ref 3.87–5.11)
RDW: 16.5 % — ABNORMAL HIGH (ref 11.5–15.5)
WBC: 12.7 10*3/uL — ABNORMAL HIGH (ref 4.0–10.5)
nRBC: 0.2 % (ref 0.0–0.2)

## 2022-10-09 LAB — BLOOD GAS, ARTERIAL
Acid-Base Excess: 4.4 mmol/L — ABNORMAL HIGH (ref 0.0–2.0)
Bicarbonate: 29.2 mmol/L — ABNORMAL HIGH (ref 20.0–28.0)
O2 Content: 4 L/min
O2 Saturation: 94.2 %
Patient temperature: 37
pCO2 arterial: 43 mmHg (ref 32–48)
pH, Arterial: 7.44 (ref 7.35–7.45)
pO2, Arterial: 67 mmHg — ABNORMAL LOW (ref 83–108)

## 2022-10-09 LAB — COMPREHENSIVE METABOLIC PANEL
ALT: 15 U/L (ref 0–44)
AST: 29 U/L (ref 15–41)
Albumin: 3 g/dL — ABNORMAL LOW (ref 3.5–5.0)
Alkaline Phosphatase: 117 U/L (ref 38–126)
Anion gap: 13 (ref 5–15)
BUN: 20 mg/dL (ref 8–23)
CO2: 25 mmol/L (ref 22–32)
Calcium: 9 mg/dL (ref 8.9–10.3)
Chloride: 97 mmol/L — ABNORMAL LOW (ref 98–111)
Creatinine, Ser: 1.02 mg/dL — ABNORMAL HIGH (ref 0.44–1.00)
GFR, Estimated: 58 mL/min — ABNORMAL LOW (ref >60)
Glucose, Bld: 105 mg/dL — ABNORMAL HIGH (ref 70–99)
Potassium: 3.7 mmol/L (ref 3.5–5.1)
Sodium: 135 mmol/L (ref 135–145)
Total Bilirubin: 3.1 mg/dL — ABNORMAL HIGH (ref 0.3–1.2)
Total Protein: 6.7 g/dL (ref 6.5–8.1)

## 2022-10-09 LAB — SEDIMENTATION RATE: Sed Rate: 30 mm/hr (ref 0–30)

## 2022-10-09 LAB — TROPONIN I (HIGH SENSITIVITY)
Troponin I (High Sensitivity): 42 ng/L — ABNORMAL HIGH (ref <18)
Troponin I (High Sensitivity): 46 ng/L — ABNORMAL HIGH (ref <18)

## 2022-10-09 LAB — LACTIC ACID, PLASMA
Lactic Acid, Venous: 2 mmol/L (ref 0.5–1.9)
Lactic Acid, Venous: 1.4 mmol/L (ref 0.5–1.9)

## 2022-10-09 LAB — GLUCOSE, CAPILLARY: Glucose-Capillary: 106 mg/dL — ABNORMAL HIGH (ref 70–99)

## 2022-10-09 LAB — RESP PANEL BY RT-PCR (FLU A&B, COVID) ARPGX2
Influenza A by PCR: NEGATIVE
Influenza B by PCR: NEGATIVE
SARS Coronavirus 2 by RT PCR: NEGATIVE

## 2022-10-09 LAB — LIPASE, BLOOD: Lipase: 27 U/L (ref 11–51)

## 2022-10-09 LAB — BRAIN NATRIURETIC PEPTIDE: B Natriuretic Peptide: 1527.2 pg/mL — ABNORMAL HIGH (ref 0.0–100.0)

## 2022-10-09 LAB — URIC ACID: Uric Acid, Serum: 12.8 mg/dL — ABNORMAL HIGH (ref 2.5–7.1)

## 2022-10-09 MED ORDER — HYDROCODONE-ACETAMINOPHEN 5-325 MG PO TABS
0.5000 | ORAL_TABLET | Freq: Three times a day (TID) | ORAL | Status: DC | PRN
  Administered 2022-10-11 – 2022-10-12 (×5): 1 via ORAL
  Filled 2022-10-09 (×5): qty 1

## 2022-10-09 MED ORDER — ENOXAPARIN SODIUM 40 MG/0.4ML IJ SOSY
40.0000 mg | PREFILLED_SYRINGE | INTRAMUSCULAR | Status: DC
  Administered 2022-10-09: 40 mg via SUBCUTANEOUS
  Filled 2022-10-09: qty 0.4

## 2022-10-09 MED ORDER — NYSTATIN 100000 UNIT/GM EX POWD
Freq: Two times a day (BID) | CUTANEOUS | Status: DC
  Filled 2022-10-09: qty 15

## 2022-10-09 MED ORDER — MOMETASONE FURO-FORMOTEROL FUM 200-5 MCG/ACT IN AERO
2.0000 | INHALATION_SPRAY | Freq: Two times a day (BID) | RESPIRATORY_TRACT | Status: DC
  Administered 2022-10-10 – 2022-10-13 (×7): 2 via RESPIRATORY_TRACT
  Filled 2022-10-09: qty 8.8

## 2022-10-09 MED ORDER — SODIUM CHLORIDE 0.9 % IV SOLN: 1.0000 g | INTRAVENOUS | Status: DC

## 2022-10-09 MED ORDER — ACETAMINOPHEN 325 MG PO TABS: 650.0000 mg | ORAL_TABLET | Freq: Four times a day (QID) | ORAL | Status: DC | PRN

## 2022-10-09 MED ORDER — SODIUM CHLORIDE 0.9 % IV SOLN: Freq: Once | INTRAVENOUS | Status: AC

## 2022-10-09 MED ORDER — ONDANSETRON HCL 4 MG/2ML IJ SOLN
4.0000 mg | Freq: Once | INTRAMUSCULAR | Status: AC
  Administered 2022-10-09: 4 mg via INTRAVENOUS
  Filled 2022-10-09: qty 2

## 2022-10-09 MED ORDER — ROPINIROLE HCL 1 MG PO TABS
3.0000 mg | ORAL_TABLET | Freq: Every day | ORAL | Status: DC
  Administered 2022-10-09 – 2022-10-12 (×4): 3 mg via ORAL
  Filled 2022-10-09 (×5): qty 3

## 2022-10-09 MED ORDER — MORPHINE SULFATE (PF) 2 MG/ML IV SOLN
2.0000 mg | INTRAVENOUS | Status: AC | PRN
  Administered 2022-10-09 – 2022-10-10 (×4): 2 mg via INTRAVENOUS
  Filled 2022-10-09 (×4): qty 1

## 2022-10-09 MED ORDER — HYDRALAZINE HCL 10 MG PO TABS: 10.0000 mg | ORAL_TABLET | Freq: Four times a day (QID) | ORAL | Status: DC | PRN

## 2022-10-09 MED ORDER — SODIUM CHLORIDE 0.9 % IV SOLN
2.0000 g | INTRAVENOUS | Status: DC
  Administered 2022-10-09 – 2022-10-12 (×4): 2 g via INTRAVENOUS
  Filled 2022-10-09 (×2): qty 20
  Filled 2022-10-09: qty 2
  Filled 2022-10-09: qty 20

## 2022-10-09 MED ORDER — SENNOSIDES-DOCUSATE SODIUM 8.6-50 MG PO TABS: 1.0000 | ORAL_TABLET | Freq: Every evening | ORAL | Status: DC | PRN

## 2022-10-09 MED ORDER — MORPHINE SULFATE (PF) 4 MG/ML IV SOLN
4.0000 mg | Freq: Once | INTRAVENOUS | Status: AC
  Administered 2022-10-09: 4 mg via INTRAVENOUS
  Filled 2022-10-09: qty 1

## 2022-10-09 MED ORDER — INFLUENZA VAC A&B SA ADJ QUAD 0.5 ML IM PRSY
0.5000 mL | PREFILLED_SYRINGE | INTRAMUSCULAR | Status: AC
  Administered 2022-10-10: 0.5 mL via INTRAMUSCULAR
  Filled 2022-10-09 (×2): qty 0.5

## 2022-10-09 MED ORDER — LEVOTHYROXINE SODIUM 100 MCG PO TABS
200.0000 ug | ORAL_TABLET | Freq: Every day | ORAL | Status: DC
  Administered 2022-10-10 – 2022-10-13 (×4): 200 ug via ORAL
  Filled 2022-10-09 (×4): qty 2

## 2022-10-09 MED ORDER — LACTATED RINGERS IV SOLN: INTRAVENOUS | Status: DC

## 2022-10-09 MED ORDER — FUROSEMIDE 10 MG/ML IJ SOLN
60.0000 mg | Freq: Once | INTRAMUSCULAR | Status: AC
  Administered 2022-10-09: 60 mg via INTRAVENOUS
  Filled 2022-10-09: qty 8

## 2022-10-09 MED ORDER — IOHEXOL 300 MG/ML  SOLN
100.0000 mL | Freq: Once | INTRAMUSCULAR | Status: AC | PRN
  Administered 2022-10-09: 100 mL via INTRAVENOUS

## 2022-10-09 MED ORDER — ACETAMINOPHEN 650 MG RE SUPP: 650.0000 mg | Freq: Four times a day (QID) | RECTAL | Status: DC | PRN

## 2022-10-09 NOTE — ED Notes (Signed)
Patient taken to CT scan.

## 2022-10-09 NOTE — Assessment & Plan Note (Addendum)
-   At baseline patient is on 3 L nasal cannula  --pt was found without O2 this morning and in no distress.

## 2022-10-09 NOTE — Assessment & Plan Note (Signed)
At baseline 

## 2022-10-09 NOTE — Assessment & Plan Note (Signed)
-   Right upper extremity ultrasound ordered to assess for DVT

## 2022-10-09 NOTE — ED Provider Notes (Signed)
[facility] Provider Note    Event Date/Time   First MD Initiated Contact with Patient 10/09/22 1536     (approximate)   History   Arm Swelling   HPI  Jaymes Revels is a 76 y.o. female with past medical history of diabetes, dysrhythmia, pulmonary hypertension, Raynaud's disease, melanoma along with multiple other medical problems presents emergency department complaining of right hand pain with redness and swelling.  Started 2 days ago.  Also having right-sided chest pain and pressure that started today.  States she wears 3 L O2 at home.  Denies fever/chills.  No vomiting or diarrhea.      Physical Exam   Triage Vital Signs: ED Triage Vitals  Enc Vitals Group     BP 10/09/22 1517 115/65     Pulse Rate 10/09/22 1517 87     Resp 10/09/22 1517 18     Temp 10/09/22 1517 98.2 F (36.8 C)     Temp Source 10/09/22 1517 Oral     SpO2 --      Weight --      Height --      Head Circumference --      Peak Flow --      Pain Score 10/09/22 1515 10     Pain Loc --      Pain Edu? --      Excl. in [place]? --     Most recent vital signs: Vitals:   10/09/22 1517  BP: 115/65  Pulse: 87  Resp: 18  Temp: 98.2 F (36.8 C)     General: Awake, no distress.   CV:  Good peripheral perfusion. regular rate and  rhythm Resp:  Normal effort. Lungs CTA Abd:  No distention.   Other:  Right hand is hot to touch, cellulitic, tender, patient has decreased range of motion due to the amount of swelling, neurovascular intact   ED Results / Procedures / Treatments   Labs (all labs ordered are listed, but only abnormal results are displayed) Labs Reviewed  BRAIN NATRIURETIC PEPTIDE - Abnormal; Notable for the following components:      Result Value   B Natriuretic Peptide 1,527.2 (*)    All other components within normal limits  COMPREHENSIVE METABOLIC PANEL - Abnormal; Notable for the following components:   Chloride 97 (*)    Glucose, Bld 105 (*)     Creatinine, Ser 1.02 (*)    Albumin 3.0 (*)    Total Bilirubin 3.1 (*)    GFR, Estimated 58 (*)    All other components within normal limits  CBC WITH DIFFERENTIAL/PLATELET - Abnormal; Notable for the following components:   WBC 12.7 (*)    RDW 16.5 (*)    Platelets 142 (*)    Neutro Abs 9.8 (*)    Monocytes Absolute 1.8 (*)    All other components within normal limits  URIC ACID - Abnormal; Notable for the following components:   Uric Acid, Serum 12.8 (*)    All other components within normal limits  LACTIC ACID, PLASMA - Abnormal; Notable for the following components:   Lactic Acid, Venous 2.0 (*)    All other components within normal limits  TROPONIN I (HIGH SENSITIVITY) - Abnormal; Notable for the following components:   Troponin I (High Sensitivity) 42 (*)    All other components within normal limits  RESP PANEL BY RT-PCR (FLU A&B, COVID) ARPGX2  CULTURE, BLOOD (SINGLE)  LIPASE, BLOOD  SEDIMENTATION RATE  URINALYSIS, ROUTINE  W REFLEX MICROSCOPIC  LACTIC ACID, PLASMA  BASIC METABOLIC PANEL  CBC  TROPONIN I (HIGH SENSITIVITY)     EKG  EKG   RADIOLOGY Chest x-ray, x-ray of the right hand    PROCEDURES:   .Critical Care E&M  Performed by: Versie Starks, PA-C Critical care provider statement:    Critical care time (minutes):  45   Critical care time was exclusive of:  Separately billable procedures and treating other patients   Critical care was necessary to treat or prevent imminent or life-threatening deterioration of the following conditions:  Sepsis and cardiac failure   Critical care was time spent personally by me on the following activities:  Blood draw for specimens, development of treatment plan with patient or surrogate, evaluation of patient's response to treatment, examination of patient, interpretation of cardiac output measurements, obtaining history from patient or surrogate, ordering and performing treatments and interventions, ordering and  review of laboratory studies, ordering and review of radiographic studies, pulse oximetry, re-evaluation of patient's condition and review of old charts   Care discussed with: admitting provider   After initial E/M assessment, critical care services were subsequently performed that were exclusive of separately billable procedures or treatment.      MEDICATIONS ORDERED IN ED: Medications  furosemide (LASIX) injection 60 mg (has no administration in time range)  lactated ringers infusion (has no administration in time range)  cefTRIAXone (ROCEPHIN) 2 g in sodium chloride 0.9 % 100 mL IVPB (has no administration in time range)  HYDROcodone-acetaminophen (NORCO/VICODIN) 5-325 MG per tablet 0.5-1 tablet (has no administration in time range)  levothyroxine (SYNTHROID) tablet 200 mcg (has no administration in time range)  rOPINIRole (REQUIP) tablet 3 mg (has no administration in time range)  mometasone-formoterol (DULERA) 200-5 MCG/ACT inhaler 2 puff (has no administration in time range)  enoxaparin (LOVENOX) injection 40 mg (has no administration in time range)  cefTRIAXone (ROCEPHIN) 1 g in sodium chloride 0.9 % 100 mL IVPB (has no administration in time range)  senna-docusate (Senokot-S) tablet 1 tablet (has no administration in time range)  hydrALAZINE (APRESOLINE) tablet 10 mg (has no administration in time range)  acetaminophen (TYLENOL) tablet 650 mg (has no administration in time range)    Or  acetaminophen (TYLENOL) suppository 650 mg (has no administration in time range)  morphine (PF) 2 MG/ML injection 2 mg (has no administration in time range)  morphine (PF) 4 MG/ML injection 4 mg (4 mg Intravenous Given 10/09/22 1656)  ondansetron (ZOFRAN) injection 4 mg (4 mg Intravenous Given 10/09/22 1654)     IMPRESSION / MDM / ASSESSMENT AND PLAN / ED COURSE  I reviewed the triage vital signs and the nursing notes.                              Differential diagnosis includes, but is not  limited to, cellulitis, MI, pulmonary hypertension, pleural effusion, gout, septic joint  Patient's presentation is most consistent with acute presentation with potential threat to life or bodily function.   Labs and imaging ordered   X-ray of the right hand and chest dependently reviewed and interpreted by me as having no fracture, osteomyelitis, or infectious process.  Labs are concerning for sepsis and chf.  Patient has a new increase in 02 here in the ER, increased her regular 02 from 3L to 4L. , patient's lactic acid is 2.0 indicating sepsis., troponin is slightly elevated 42 but her normal trend runs in  the high 30s to low 40s so feel that MI is less likely;  comprehensive metabolic panel has a decreased GFR, BNP is grossly elevated at 1527 indicating chf and WBC is elevated at 12.7  Initiated code sepsis due to the elevated lactic acid along with elevated white count and cellulitis of the right hand.  We will also start IV Lasix and rocephin 2 g; did not start fluids due to chf  Consult to hospitalist, spoke with Dr. Tobie Poet.  Told her to not start fluids due to the patient's congestive heart failure with elevated BNP.  She is coming to the ED to see the patient.  Patient is stable at this time  FINAL CLINICAL IMPRESSION(S) / ED DIAGNOSES   Final diagnoses:  Acute sepsis ([place])  Acute systolic congestive heart failure (HCC)  Idiopathic gout, unspecified chronicity, unspecified site     Rx / DC Orders   ED Discharge Orders     None        Note:  This document was prepared using Dragon voice recognition software and may include unintentional dictation errors.    Versie Starks, PA-C 10/09/22 Darlin Coco, MD 10/09/22 [phone]

## 2022-10-09 NOTE — Assessment & Plan Note (Addendum)
-   Levothyroxine 200 mcg daily

## 2022-10-09 NOTE — Assessment & Plan Note (Addendum)
--  resume home aldactone and torsemide

## 2022-10-09 NOTE — H&P (Addendum)
History and Physical   Safire Gordin KYH:[id number] DOB: July 30, 1946 DOA: 10/09/2022  PCP: Venia Carbon, MD  Outpatient Specialists: Dr. Stevphen [facility] clinic cardiology Patient coming from: Home  I have personally briefly reviewed patient's old medical records in [place].  Chief Concern:   HPI: Ms. Azula Zappia is a 76 year old female with history of pulmonary hypertension, chronic O2 requirement, depression, anxiety, non-insulin-dependent diabetes mellitus, hypothyroid, who presents emergency department for chief concerns of right arm swelling.  Initial vitals in the emergency department showed temperature of 98.2, respiration rate of 18, heart rate of 87, blood pressure 115/68, SPO2 of 92% on 5 L nasal cannula.  Serum sodium is 137, potassium 3.7, chloride 97, bicarb 25, BUN of 20, serum creatinine of 1.02, GFR 58, nonfasting blood glucose 105, WBC 12.7, hemoglobin 14.4, platelets of 142.  BNP elevated 1527.  Uric acid of 12.8.  Lactic acid has been ordered and pending.  ED treatment: Morphine 4 mg IV one-time dose, ondansetron 4 mg IV, ceftriaxone 2 g IV, furosemide 60 mg IV one-time dose. -------------------------- At bedside, she is able to tell me her name, age, current location  Yesterday she noticed her hand started to swelling and with increase pain.  There is a small necrotic scabbed on her arm, patient does not know when that started.  Patient's husband noticed it bleeding yesterday however does not know what happened and or when the wound started.  She denies any chest pain, shortness of breath, nausea, vomiting, abdominal pain, dysuria, hematuria, diarrhea.  He endorses baseline bilateral lower extremity swelling.  She denies fever, chills.  Social history: She lives at home with her husband.  She denies history of tobacco, EtOH, recreational drug use.  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  ROS: Constitutional: no weight change, no  fever ENT/Mouth: no sore throat, no rhinorrhea Eyes: no eye pain, no vision changes Cardiovascular: no chest pain, no dyspnea,  no edema, no palpitations Respiratory: no cough, no sputum, no wheezing Gastrointestinal: no nausea, no vomiting, no diarrhea, no constipation Genitourinary: no urinary incontinence, no dysuria, no hematuria Musculoskeletal: no arthralgias, no myalgias Skin: + skin lesions, no pruritus, swelling of her right wrist Neuro: + weakness, no loss of consciousness, no syncope Psych: no anxiety, no depression, + decrease appetite Heme/Lymph: no bruising, no bleeding  ED Course: Disc with emergency medicine provider, patient requiring hospitalization for chief concerns of acute on chronic hypoxic respiratory failure.  Assessment/Plan  Principal Problem:   Cellulitis Active Problems:   Chronic hypoxemic respiratory failure (HCC)   Pulmonary hypertension (HCC)   Hypertension   Tremor, essential   OSA (obstructive sleep apnea)   RLS (restless legs syndrome)   Raynaud disease   Hypothyroidism   Hashimoto's thyroiditis   Raynaud's disease without gangrene   Stage 3a chronic kidney disease (HCC)   Pain and swelling of right upper extremity   Assessment and Plan:  * Cellulitis - Was extreme pain and swelling - CT of the right hand and risk with contrast have been ordered to assess for deeper infection - Pain control: Hydrocodone-acetaminophen 5-325 mg, 0.5 to 1 tablet 3 times daily as needed for moderate pain; morphine 2 mg IV every 4 hours as needed for severe pain, 4 doses ordered  Chronic hypoxemic respiratory failure (HCC) - At baseline patient is on 3 L nasal cannula and in the hospital she is requiring 4 L  Pain and swelling of right upper extremity - Right upper extremity ultrasound  ordered to assess for DVT  Stage 3a chronic kidney disease ([place]) - At baseline  Hypothyroidism - Levothyroxine 200 mcg daily resumed  RLS (restless legs syndrome) -  Resumed home ropinirole 3 mg nightly  OSA (obstructive sleep apnea) - CPAP nightly  Tremor, essential - At baseline  Hypertension - Hydralazine 10 mg p.o. every 6 hours.  For SBP greater than 180, 4 days ordered  Chart reviewed.   DVT prophylaxis: Enoxaparin 40 mg subcutaneous every 8 hours Code Status: Full code Diet: Heart healthy Family Communication: Gated spouse and daughter Judson Roch at bedside with patient's permission Disposition Plan: Pending clinical course Consults called: None at this time Admission status: MedSurg, observation  Past Medical History:  Diagnosis Date   Asthma    Chronic hypoxemic respiratory failure ([place])    uses O2 with exertion and with CPAP   COPD (chronic obstructive pulmonary disease) ([place])    Depression    Diabetes mellitus without complication (HCC)    Dyspnea    doe   Dysrhythmia    extra beat   Fatty liver    Fibromyalgia    Generalized osteoarthritis of multiple sites    History of hiatal hernia    Hypertension    Hypothyroidism    Melanoma ([place]) 07/2018   Right leg   OSA (obstructive sleep apnea)    Pain    chronic ruq and back pain   Panic attacks    PONV (postoperative nausea and vomiting)    after thyroidectomy   Raynaud disease    RLS (restless legs syndrome)    Spleen absent    TOLD ABSENT THEN TOLD DOES HAVE SPLEEN. PATIENT IS UNCERTAIN   Tremor, essential    Wears dentures    full upper and lower   Past Surgical History:  Procedure Laterality Date   CATARACT EXTRACTION W/PHACO Right 03/04/2021   Procedure: CATARACT EXTRACTION PHACO AND INTRAOCULAR LENS PLACEMENT ([facility]) RIGHT DIABETIC 3.98 00:33.2;  Surgeon: Eulogio Bear, MD;  Location: [place];  Service: Ophthalmology;  Laterality: Right;  Diabetic - oral meds   CATARACT EXTRACTION W/PHACO Left 03/18/2021   Procedure: CATARACT EXTRACTION PHACO AND INTRAOCULAR LENS PLACEMENT ([place]) LEFT;  Surgeon: Eulogio Bear, MD;  Location: [place];   Service: Ophthalmology;  Laterality: Left;  2.96 0:27.9   CHOLECYSTECTOMY     DILATATION & CURETTAGE/HYSTEROSCOPY WITH MYOSURE N/A 11/10/2018   Procedure: DILATATION & CURETTAGE/HYSTEROSCOPY WITH MYOSURE/MYOMECTOMY;  Surgeon: Aletha Halim, MD;  Location: [place];  Service: Gynecology;  Laterality: N/A;  possible myosure.  Please use myosure scope, do not open myosure blades but have in the room   JOINT REPLACEMENT Bilateral    2008/2011   MELANOMA EXCISION  07/2018   REVERSE SHOULDER ARTHROPLASTY Right 05/28/2017   Procedure: REVERSE SHOULDER ARTHROPLASTY;  Surgeon: Corky Mull, MD;  Location: [facility];  Service: Orthopedics;  Laterality: Right;   RHINOPLASTY  1972   RIGHT HEART CATH N/A 12/27/2021   Procedure: RIGHT HEART CATH;  Surgeon: Andrez Grime, MD;  Location: [place] [facility];  Service: Cardiovascular;  Laterality: N/A;   THYROIDECTOMY  2006   TOTAL KNEE ARTHROPLASTY     bilateral   Social History:  reports that she quit smoking about 33 years ago. Her smoking use included cigarettes. She has a 1.25 pack-year smoking history. She has never used smokeless tobacco. She reports that she does not drink alcohol and does not use drugs.  Allergies  Allergen Reactions   Latex  Hives    Tape and bandaids only   Nickel Dermatitis   Pramipexole Other (See Comments)    Caused hallucinations, says she can take name brand.   Prednisone     Makes her "feel crazy" - can take low dosage    Topiramate Other (See Comments)    "spaced out"   Citalopram Anxiety   Paroxetine Hcl Anxiety   Sertraline Anxiety   Family History  Problem Relation Age of Onset   Osteoarthritis Mother    Diabetes Mother    Cirrhosis Mother    Cancer Father    Kidney cancer Father    Bladder Cancer Father    Heart disease Brother        stents in 1 brother   Breast cancer Neg Hx    Family history: Family history reviewed and not pertinent  Prior to Admission medications    Medication Sig Start Date End Date Taking? Authorizing Provider  acetaminophen (TYLENOL) 650 MG CR tablet Take 650 mg by mouth every 8 (eight) hours as needed for pain.    [provider]  albuterol (PROVENTIL HFA) 108 (90 Base) MCG/ACT inhaler Inhale 2 puffs into the lungs every 6 (six) hours as needed for wheezing or shortness of breath. 01/06/20   Venia Carbon, MD  ALPRAZolam Duanne Moron) 0.25 MG tablet Take 1 tablet (0.25 mg total) by mouth 2 (two) times daily as needed for anxiety. 08/21/22   Venia Carbon, MD  aspirin EC 81 MG tablet Take 81 mg by mouth 2 (two) times daily.    [provider]  budesonide-formoterol (SYMBICORT) 160-4.5 MCG/ACT inhaler Inhale 2 puffs into the lungs 2 (two) times daily. 03/05/22   Venia Carbon, MD  calcium carbonate (OS-CAL) 600 MG TABS Take 600 mg by mouth daily with breakfast. Patient not taking: Reported on 08/18/2022    [provider]  DULoxetine (CYMBALTA) 60 MG capsule TAKE 1 CAPSULE BY MOUTH  DAILY 12/09/21   [name] I, MD  glucose blood (ONETOUCH VERIO) test strip Use to check blood sugar once a day. E11.9 02/21/22   Venia Carbon, MD  HYDROcodone-acetaminophen (NORCO/VICODIN) 5-325 MG tablet Take 0.5-1 tablets by mouth 3 (three) times daily as needed for moderate pain. 10/08/22   Venia Carbon, MD  ipratropium-albuterol (DUONEB) 0.5-2.5 (3) MG/3ML SOLN Inhale 3 mLs into the lungs 4 (four) times daily as needed. 01/08/22 01/03/23  [provider]  ketoconazole (NIZORAL) 2 % cream Apply 1 application  topically daily. Patient not taking: Reported on 10/08/2022 06/02/22   Venia Carbon, MD  levothyroxine (SYNTHROID) 200 MCG tablet TAKE 1 TABLET BY MOUTH DAILY  BEFORE BREAKFAST 07/14/22   [name] I, MD  metFORMIN (GLUCOPHAGE-XR) 500 MG 24 hr tablet TAKE 1 TABLET BY MOUTH  DAILY WITH BREAKFAST 12/09/21   [name] I, MD  montelukast (SINGULAIR) 10 MG tablet TAKE 1 TABLET BY MOUTH AT   BEDTIME 12/09/21   Venia Carbon, MD  [facility] DELICA LANCETS FINE MISC 1 each by Does not apply route daily. Use to check blood sugar once a day. E11.9 09/30/18   Venia Carbon, MD  rOPINIRole (REQUIP) 3 MG tablet Take 1 tablet (3 mg total) by mouth at bedtime. 03/13/22   Venia Carbon, MD  spironolactone (ALDACTONE) 25 MG tablet Take 1 tablet (25 mg total) by mouth daily. 08/18/22   Venia Carbon, MD  torsemide (DEMADEX) 20 MG tablet Take 1 tablet (20 mg total) by mouth 2 (  two) times daily. Hold 2nd dose if home weight under 190# 08/18/22   Venia Carbon, MD   Physical Exam: Vitals:   10/09/22 1517 10/09/22 1800 10/09/22 1950  BP: 115/65 105/75 (!) 138/112  Pulse: 87  (!) 102  Resp: 18  16  Temp: 98.2 F (36.8 C)  97.7 F (36.5 C)  TempSrc: Oral  Oral  SpO2:   92%   Constitutional: appears frail, chronically ill, NAD, calm, comfortable Eyes: PERRL, lids and conjunctivae normal ENMT: Mucous membranes are moist. Posterior pharynx clear of any exudate or lesions. Age-appropriate dentition. Hearing appropriate Neck: normal, supple, no masses, no thyromegaly Respiratory: clear to auscultation bilaterally, no wheezing, no crackles. Normal respiratory effort. No accessory muscle use.  Cardiovascular: Regular rate and rhythm, no murmurs / rubs / gallops. No extremity edema. 2+ pedal pulses. No carotid bruits.  Abdomen: no tenderness, no masses palpated, no hepatosplenomegaly. Bowel sounds positive.  Musculoskeletal: no clubbing / cyanosis. No joint deformity upper and lower extremities. Good ROM, no contractures, no atrophy. Normal muscle tone.  Skin: + right hand swelling   Neurologic: Sensation intact. Strength 5/5 in all 4.  Psychiatric: Normal judgment and insight. Alert and oriented x 3. Normal mood.   EKG: independently reviewed, showing sinus rhythm with rate of 96, QTc 500  Chest x-ray on Admission: I personally reviewed and I agree with radiologist reading as  below.  CT HAND RIGHT W CONTRAST  Result Date: 10/09/2022 CLINICAL DATA:  Soft tissue mass swelling EXAM: CT OF THE UPPER RIGHT EXTREMITY WITH CONTRAST TECHNIQUE: Multidetector CT imaging of the upper right extremity was performed according to the standard protocol following intravenous contrast administration. RADIATION DOSE REDUCTION: This exam was performed according to the departmental dose-optimization program which includes automated exposure control, adjustment of the mA and/or kV according to patient size and/or use of iterative reconstruction technique. CONTRAST:  175m OMNIPAQUE IOHEXOL 300 MG/ML  SOLN COMPARISON:  Radiograph 10/09/2022 FINDINGS: Bones/Joint/Cartilage No fracture or malalignment. No periostitis or osseous destructive change. Advanced joint space narrowing and osteophyte at the first [facility]. Moderate joint space narrowing at the STT interval and second CMC joint. Severe arthritis at the second PIP joint and third D IP joint. Mild to moderate joint space narrowing and arthritis at the remaining IP joints. Chronic appearing calcification adjacent to the pisiform. Ossicle at the ulnar styloid. Ligaments Suboptimally assessed by CT. Muscles and Tendons No obvious intramuscular fluid collections. No significant atrophy. Probable edema at the carpal tunnel. Soft tissues Considerable soft tissue edema primarily along the dorsum of the wrist and hand. No rim enhancing fluid collection to suggest abscess. No soft tissue emphysema. IMPRESSION: 1. Considerable soft tissue edema greatest along the dorsum of the hand and wrist, suggestive of cellulitis. Negative for rim enhancing soft tissue abscess or gas collections. 2. Arthritis at the wrist and digits. There is no osseous destructive change, erosion or periostitis to suggest osteomyelitis. MRI follow-up if continued concern. Electronically Signed   By: KDonavan FoilM.D.   On: 10/09/2022 19:47   CT WRIST RIGHT W CONTRAST  Result Date:  10/09/2022 CLINICAL DATA:  Soft tissue mass swelling EXAM: CT OF THE UPPER RIGHT EXTREMITY WITH CONTRAST TECHNIQUE: Multidetector CT imaging of the upper right extremity was performed according to the standard protocol following intravenous contrast administration. RADIATION DOSE REDUCTION: This exam was performed according to the departmental dose-optimization program which includes automated exposure control, adjustment of the mA and/or kV according to patient size and/or use of iterative  reconstruction technique. CONTRAST:  167m OMNIPAQUE IOHEXOL 300 MG/ML  SOLN COMPARISON:  Radiograph 10/09/2022 FINDINGS: Bones/Joint/Cartilage No fracture or malalignment. No periostitis or osseous destructive change. Advanced joint space narrowing and osteophyte at the first [facility]. Moderate joint space narrowing at the STT interval and second CMC joint. Severe arthritis at the second PIP joint and third D IP joint. Mild to moderate joint space narrowing and arthritis at the remaining IP joints. Chronic appearing calcification adjacent to the pisiform. Ossicle at the ulnar styloid. Ligaments Suboptimally assessed by CT. Muscles and Tendons No obvious intramuscular fluid collections. No significant atrophy. Probable edema at the carpal tunnel. Soft tissues Considerable soft tissue edema primarily along the dorsum of the wrist and hand. No rim enhancing fluid collection to suggest abscess. No soft tissue emphysema. IMPRESSION: 1. Considerable soft tissue edema greatest along the dorsum of the hand and wrist, suggestive of cellulitis. Negative for rim enhancing soft tissue abscess or gas collections. 2. Arthritis at the wrist and digits. There is no osseous destructive change, erosion or periostitis to suggest osteomyelitis. MRI follow-up if continued concern. Electronically Signed   By: KDonavan FoilM.D.   On: 10/09/2022 19:47   DG Hand Complete Right  Result Date: 10/09/2022 CLINICAL DATA:  Pain, swelling, and redness.  Right hand and wrist are high to touch. EXAM: RIGHT HAND - COMPLETE 3+ VIEW COMPARISON:  None Available. FINDINGS: Severe thumb carpometacarpal, index finger PIP, and third digit DIP joint space narrowing, subchondral sclerosis/cystic change, and peripheral osteophytosis. Mild-to-moderate lateral aspect of the thumb interphalangeal joint space narrowing. Mild-to-moderate joint space narrowing of the other interphalangeal joints. Moderate triscaphe and second carpometacarpal joint space narrowing. Tiny well corticated chronic ossicle distal to the ulnar styloid, the sequela of remote trauma. Moderate dorsal hand soft tissue swelling centered at the metacarpal heads. No cortical erosion or acute fracture is seen.  No dislocation. IMPRESSION: 1. Severe thumb carpometacarpal, index finger PIP, and third digit DIP osteoarthritis. 2. Moderate triscaphe and second carpometacarpal osteoarthritis. 3. No cortical erosion is seen. Electronically Signed   By: RYvonne KendallM.D.   On: 10/09/2022 16:56   DG Chest Portable 1 View  Result Date: 10/09/2022 CLINICAL DATA:  Difficulty breathing.  Chest pressure on right side. EXAM: PORTABLE CHEST 1 VIEW COMPARISON:  Chest two views 06/27/2022 and CT chest 06/27/2022 FINDINGS: Cardiac silhouette is again markedly enlarged. Mediastinal contours are unchanged, with chronic enlargement of the main pulmonary artery as can be seen with central pulmonary arterial hypertension seen on the prior CT, and ascending aortic aneurysm measuring up to 4.5 cm seen on prior CT. Mild calcification within the aortic arch. Mild curvilinear left mid to lower lung subsegmental atelectasis versus scarring is similar to prior CT. No definite pleural effusion. No pneumothorax. No acute skeletal abnormality. IMPRESSION: 1. No significant change from prior. 2. Marked cardiomegaly. 3. No acute lung process. Electronically Signed   By: RYvonne KendallM.D.   On: 10/09/2022 16:54    Labs on Admission: I have  personally reviewed following labs  CBC: Recent Labs  Lab 10/09/22 1647  WBC 12.7*  NEUTROABS 9.8*  HGB 14.4  HCT 43.4  MCV 95.8  PLT 1604   Basic Metabolic Panel: Recent Labs  Lab 10/09/22 1647  NA 135  K 3.7  CL 97*  CO2 25  GLUCOSE 105*  BUN 20  CREATININE 1.02*  CALCIUM 9.0   GFR: CrCl cannot be calculated (Unknown ideal weight.).  Liver Function Tests: Recent Labs  Lab 10/09/22  1647  AST 29  ALT 15  ALKPHOS 117  BILITOT 3.1*  PROT 6.7  ALBUMIN 3.0*   Recent Labs  Lab 10/09/22 1647  LIPASE 27   Urine analysis:    Component Value Date/Time   COLORURINE AMBER (A) 10/23/2018 1425   APPEARANCEUR CLOUDY (A) 10/23/2018 1425   LABSPEC 1.014 10/23/2018 1425   PHURINE 5.0 10/23/2018 1425   GLUCOSEU NEGATIVE 10/23/2018 1425   HGBUR MODERATE (A) 10/23/2018 1425   BILIRUBINUR negative 05/05/2022 1201   KETONESUR 20 (A) 10/23/2018 1425   PROTEINUR Positive (A) 05/05/2022 1201   PROTEINUR 100 (A) 10/23/2018 1425   UROBILINOGEN 1.0 05/05/2022 1201   UROBILINOGEN 0.2 11/08/2010 1253   NITRITE positive 05/05/2022 1201   NITRITE NEGATIVE 10/23/2018 1425   LEUKOCYTESUR Negative 05/05/2022 1201   Dr. Tobie Poet [facility]  If 7PM-7AM, please contact overnight-coverage provider If 7AM-7PM, please contact day coverage provider [email or web address]  10/09/2022, 7:59 PM

## 2022-10-09 NOTE — Assessment & Plan Note (Addendum)
-  right hand edematous and painful with movement - CT of the right hand showed soft tissue edema and Arthritis at the wrist and digits --started on ceftriaxone on admission, some improvement in pain and erythema today Plan: -cont ceftriaxone

## 2022-10-09 NOTE — Consult Note (Signed)
CODE SEPSIS - PHARMACY COMMUNICATION  **Broad Spectrum Antibiotics should be administered within 1 hour of Sepsis diagnosis**  Time Code Sepsis Called/Page Received: 1743  Antibiotics Ordered: ceftriaxone  Time of 1st antibiotic administration: 1824  Additional action taken by pharmacy: none   Oswald Hillock ,PharmD Clinical Pharmacist  10/09/2022  6:46 PM

## 2022-10-09 NOTE — Assessment & Plan Note (Addendum)
-   cont home ropinirole 3 mg nightly

## 2022-10-09 NOTE — ED Triage Notes (Signed)
Swelling to right arm since yesterday.  Painful to move. Upon triage pt has redness and swelling to right hand/wrist that is hot to touch. Pt states "everything you touch on me hurts"

## 2022-10-09 NOTE — Assessment & Plan Note (Signed)
CPAP nightly

## 2022-10-09 NOTE — Hospital Course (Addendum)
Ms. Kari Medina is a 76 year old female with history of pulmonary hypertension, chronic O2 requirement, depression, anxiety, non-insulin-dependent diabetes mellitus, hypothyroid, who presents emergency department for chief concerns of right arm swelling.  Initial vitals in the emergency department showed temperature of 98.2, respiration rate of 18, heart rate of 87, blood pressure 115/68, SPO2 of 92% on 5 L nasal cannula.  Serum sodium is 137, potassium 3.7, chloride 97, bicarb 25, BUN of 20, serum creatinine of 1.02, GFR 58, nonfasting blood glucose 105, WBC 12.7, hemoglobin 14.4, platelets of 142.  BNP elevated 1527.  Uric acid of 12.8.  Lactic acid has been ordered and pending.  ED treatment: Morphine 4 mg IV one-time dose, ondansetron 4 mg IV, ceftriaxone 2 g IV, furosemide 60 mg IV one-time dose.

## 2022-10-09 NOTE — ED Triage Notes (Signed)
Patient arrived by EMS From home for swelling in right arm. Swelling has increased today. Painful to touch or movement. Chronically wears 3L O2  HX pulmonary hypertension  Patient took 324 aspirin before EMS arrival  EMS vitals: 120/80 b/p 90P 98% 4L O2 131cbg

## 2022-10-09 NOTE — Sepsis Progress Note (Signed)
Code Sepsis protocol being monitored by eLink. 

## 2022-10-10 ENCOUNTER — Observation Stay: Payer: Medicare Other

## 2022-10-10 DIAGNOSIS — I10 Essential (primary) hypertension: Secondary | ICD-10-CM | POA: Diagnosis not present

## 2022-10-10 DIAGNOSIS — E1122 Type 2 diabetes mellitus with diabetic chronic kidney disease: Secondary | ICD-10-CM | POA: Diagnosis present

## 2022-10-10 DIAGNOSIS — Z1152 Encounter for screening for COVID-19: Secondary | ICD-10-CM | POA: Diagnosis not present

## 2022-10-10 DIAGNOSIS — G250 Essential tremor: Secondary | ICD-10-CM | POA: Diagnosis present

## 2022-10-10 DIAGNOSIS — M19041 Primary osteoarthritis, right hand: Secondary | ICD-10-CM | POA: Diagnosis present

## 2022-10-10 DIAGNOSIS — I272 Pulmonary hypertension, unspecified: Secondary | ICD-10-CM | POA: Diagnosis present

## 2022-10-10 DIAGNOSIS — Z7984 Long term (current) use of oral hypoglycemic drugs: Secondary | ICD-10-CM | POA: Diagnosis not present

## 2022-10-10 DIAGNOSIS — N1831 Chronic kidney disease, stage 3a: Secondary | ICD-10-CM | POA: Diagnosis present

## 2022-10-10 DIAGNOSIS — G2581 Restless legs syndrome: Secondary | ICD-10-CM | POA: Diagnosis present

## 2022-10-10 DIAGNOSIS — E890 Postprocedural hypothyroidism: Secondary | ICD-10-CM | POA: Diagnosis present

## 2022-10-10 DIAGNOSIS — K760 Fatty (change of) liver, not elsewhere classified: Secondary | ICD-10-CM | POA: Diagnosis present

## 2022-10-10 DIAGNOSIS — J9611 Chronic respiratory failure with hypoxia: Secondary | ICD-10-CM | POA: Diagnosis present

## 2022-10-10 DIAGNOSIS — G4733 Obstructive sleep apnea (adult) (pediatric): Secondary | ICD-10-CM | POA: Diagnosis present

## 2022-10-10 DIAGNOSIS — E11622 Type 2 diabetes mellitus with other skin ulcer: Secondary | ICD-10-CM | POA: Diagnosis present

## 2022-10-10 DIAGNOSIS — L97919 Non-pressure chronic ulcer of unspecified part of right lower leg with unspecified severity: Secondary | ICD-10-CM | POA: Diagnosis present

## 2022-10-10 DIAGNOSIS — Z79890 Hormone replacement therapy (postmenopausal): Secondary | ICD-10-CM | POA: Diagnosis not present

## 2022-10-10 DIAGNOSIS — L03011 Cellulitis of right finger: Secondary | ICD-10-CM | POA: Diagnosis not present

## 2022-10-10 DIAGNOSIS — F32A Depression, unspecified: Secondary | ICD-10-CM | POA: Diagnosis present

## 2022-10-10 DIAGNOSIS — Z23 Encounter for immunization: Secondary | ICD-10-CM | POA: Diagnosis present

## 2022-10-10 DIAGNOSIS — L03113 Cellulitis of right upper limb: Secondary | ICD-10-CM | POA: Diagnosis present

## 2022-10-10 DIAGNOSIS — L0390 Cellulitis, unspecified: Secondary | ICD-10-CM | POA: Diagnosis present

## 2022-10-10 DIAGNOSIS — Z87891 Personal history of nicotine dependence: Secondary | ICD-10-CM | POA: Diagnosis not present

## 2022-10-10 DIAGNOSIS — I7300 Raynaud's syndrome without gangrene: Secondary | ICD-10-CM | POA: Diagnosis present

## 2022-10-10 DIAGNOSIS — M797 Fibromyalgia: Secondary | ICD-10-CM | POA: Diagnosis present

## 2022-10-10 DIAGNOSIS — J449 Chronic obstructive pulmonary disease, unspecified: Secondary | ICD-10-CM | POA: Diagnosis present

## 2022-10-10 DIAGNOSIS — L97929 Non-pressure chronic ulcer of unspecified part of left lower leg with unspecified severity: Secondary | ICD-10-CM | POA: Diagnosis present

## 2022-10-10 DIAGNOSIS — I129 Hypertensive chronic kidney disease with stage 1 through stage 4 chronic kidney disease, or unspecified chronic kidney disease: Secondary | ICD-10-CM | POA: Diagnosis present

## 2022-10-10 LAB — URINALYSIS, ROUTINE W REFLEX MICROSCOPIC
Bilirubin Urine: NEGATIVE
Glucose, UA: NEGATIVE mg/dL
Hgb urine dipstick: NEGATIVE
Ketones, ur: NEGATIVE mg/dL
Leukocytes,Ua: NEGATIVE
Nitrite: POSITIVE — AB
Protein, ur: NEGATIVE mg/dL
Specific Gravity, Urine: 1.028 (ref 1.005–1.030)
pH: 5 (ref 5.0–8.0)

## 2022-10-10 LAB — BASIC METABOLIC PANEL
Anion gap: 12 (ref 5–15)
BUN: 21 mg/dL (ref 8–23)
CO2: 25 mmol/L (ref 22–32)
Calcium: 8.9 mg/dL (ref 8.9–10.3)
Chloride: 99 mmol/L (ref 98–111)
Creatinine, Ser: 1.15 mg/dL — ABNORMAL HIGH (ref 0.44–1.00)
GFR, Estimated: 50 mL/min — ABNORMAL LOW (ref >60)
Glucose, Bld: 107 mg/dL — ABNORMAL HIGH (ref 70–99)
Potassium: 3.3 mmol/L — ABNORMAL LOW (ref 3.5–5.1)
Sodium: 136 mmol/L (ref 135–145)

## 2022-10-10 LAB — CBC
HCT: 44.3 % (ref 36.0–46.0)
Hemoglobin: 14.6 g/dL (ref 12.0–15.0)
MCH: 31.5 pg (ref 26.0–34.0)
MCHC: 33 g/dL (ref 30.0–36.0)
MCV: 95.7 fL (ref 80.0–100.0)
Platelets: 158 10*3/uL (ref 150–400)
RBC: 4.63 MIL/uL (ref 3.87–5.11)
RDW: 16.5 % — ABNORMAL HIGH (ref 11.5–15.5)
WBC: 10.3 10*3/uL (ref 4.0–10.5)
nRBC: 0 % (ref 0.0–0.2)

## 2022-10-10 LAB — GLUCOSE, CAPILLARY: Glucose-Capillary: 98 mg/dL (ref 70–99)

## 2022-10-10 MED ORDER — ORAL CARE MOUTH RINSE: 15.0000 mL | OROMUCOSAL | Status: DC | PRN

## 2022-10-10 MED ORDER — POTASSIUM CHLORIDE CRYS ER 20 MEQ PO TBCR
40.0000 meq | EXTENDED_RELEASE_TABLET | Freq: Once | ORAL | Status: AC
  Administered 2022-10-10: 40 meq via ORAL
  Filled 2022-10-10: qty 2

## 2022-10-10 MED ORDER — TORSEMIDE 20 MG PO TABS
20.0000 mg | ORAL_TABLET | Freq: Two times a day (BID) | ORAL | Status: DC
  Administered 2022-10-10 – 2022-10-13 (×6): 20 mg via ORAL
  Filled 2022-10-10 (×6): qty 1

## 2022-10-10 MED ORDER — DULOXETINE HCL 30 MG PO CPEP
60.0000 mg | ORAL_CAPSULE | Freq: Every day | ORAL | Status: DC
  Administered 2022-10-10 – 2022-10-13 (×4): 60 mg via ORAL
  Filled 2022-10-10 (×4): qty 2

## 2022-10-10 MED ORDER — ENOXAPARIN SODIUM 60 MG/0.6ML IJ SOSY
0.5000 mg/kg | PREFILLED_SYRINGE | INTRAMUSCULAR | Status: DC
  Administered 2022-10-10 – 2022-10-12 (×3): 45 mg via SUBCUTANEOUS
  Filled 2022-10-10 (×3): qty 0.6

## 2022-10-10 MED ORDER — SPIRONOLACTONE 25 MG PO TABS
25.0000 mg | ORAL_TABLET | Freq: Every day | ORAL | Status: DC
  Administered 2022-10-10 – 2022-10-13 (×4): 25 mg via ORAL
  Filled 2022-10-10 (×4): qty 1

## 2022-10-10 NOTE — Progress Notes (Signed)
PHARMACIST - PHYSICIAN COMMUNICATION  CONCERNING:  Enoxaparin (Lovenox) for DVT Prophylaxis    RECOMMENDATION: Patient was prescribed enoxaprin '40mg'$  q24 hours for VTE prophylaxis.   Filed Weights   10/10/22 1200  Weight: 89.4 kg (197 lb 1.5 oz)    Body mass index is 33.83 kg/m.  Estimated Creatinine Clearance: 46.5 mL/min (A) (by C-G formula based on SCr of 1.15 mg/dL (H)).   Based on [place] patient is candidate for enoxaparin 0.'5mg'$ /kg TBW SQ every 24 hours based on BMI being >30.   DESCRIPTION: Pharmacy has adjusted enoxaparin dose per [facility] policy.  Patient is now receiving enoxaparin 0.5 mg/kg every 24 hours    Dallie Piles, PharmD Clinical Pharmacist  10/10/2022 12:15 PM

## 2022-10-10 NOTE — Progress Notes (Signed)
   10/10/22 1100  Clinical Encounter Type  Visited With Patient and family together  Visit Type Initial;Spiritual support  Referral From Nurse  Consult/Referral To [place] responded to [facility] consult for prayer. Chaplain intervened and provided support through meaningful conversation allowing patient to unload fears, concerns, and pains. Encouragement was given through prayer and sharing Scripture.

## 2022-10-10 NOTE — Progress Notes (Signed)
  PROGRESS NOTE    Kari Medina  VXY:[id number] DOB: [birth year] DOA: 10/09/2022 PCP: Venia Carbon, MD  229A/229A-AA  LOS: 0 days   Brief hospital course:   Assessment & Plan: Kari Medina is a 76 year old female with history of pulmonary hypertension, chronic O2 requirement, depression, anxiety, non-insulin-dependent diabetes mellitus, hypothyroid, who presented emergency department for chief concerns of right arm swelling.   * Cellulitis -right hand edematous and painful with movement - CT of the right hand showed soft tissue edema and Arthritis at the wrist and digits --started on ceftriaxone on admission Plan: -cont ceftriaxone  Chronic hypoxemic respiratory failure (HCC) - At baseline patient is on 3 L nasal cannula  --pt was found without O2 this morning and in no distress.  Pulmonary hypertension ([place]) --resume home aldactone and torsemide  Stage 3a chronic kidney disease (HCC) - At baseline  Hypothyroidism - Levothyroxine 200 mcg daily   RLS (restless legs syndrome) - cont home ropinirole 3 mg nightly  OSA (obstructive sleep apnea) - CPAP nightly  Tremor, essential - At baseline  Hypertension --resume home aldactone and torsemide   DVT prophylaxis: Lovenox SQ Code Status: Full code  Family Communication: husband updated at bedside today Level of care: Med-Surg Dispo:   The patient is from: home Anticipated d/c is to: home Anticipated d/c date is: 2-3 days   Subjective and Interval History:  Pt complained of severe pain in her right hand even just moving it passively.  Pt reported breathing and BLE swelling at baseline.   Objective: Vitals:   10/10/22 0430 10/10/22 1001 10/10/22 1200 10/10/22 1614  BP:  114/71  103/67  Pulse:  96  84  Resp:  18  20  Temp:  97.9 F (36.6 C)  97.6 F (36.4 C)  TempSrc:    Oral  SpO2: 91% 97%  91%  Weight:   89.4 kg     Intake/Output Summary (Last 24 hours) at 10/10/2022 1650 Last data filed at  10/10/2022 1000 Gross per 24 hour  Intake 480 ml  Output 650 ml  Net -170 ml   Filed Weights   10/10/22 1200  Weight: 89.4 kg    Examination:   Constitutional: NAD, AAOx3 HEENT: conjunctivae and lids normal, EOMI CV: No cyanosis.   RESP: normal respiratory effort, on RA (took off supplemental O2) Extremities: mild edema in BLE with small venous stasis ulcers SKIN: warm, dry Neuro: II - XII grossly intact.   Psych: Normal mood and affect.  Appropriate judgement and reason   Data Reviewed: I have personally reviewed labs and imaging studies  Time spent: 50 minutes  Enzo Bi, MD [facility] If 7PM-7AM, please contact night-coverage 10/10/2022, 4:50 PM

## 2022-10-10 NOTE — Assessment & Plan Note (Signed)
--  resume home aldactone and torsemide

## 2022-10-10 NOTE — TOC Initial Note (Signed)
Transition of Care [facility]) - Initial/Assessment Note    Patient Details  Name: Kari Medina MRN: [id number] Date of Birth: [birth year]  Transition of [facility]) CM/SW Contact:    Laurena Slimmer, RN Phone Number: 10/10/2022, 12:26 PM  Clinical Narrative:                  Transition of Care [facility]) Screening Note   Patient Details  Name: Kari Medina Date of Birth: 1946/04/17   Transition of Care [facility]) CM/SW Contact:    Laurena Slimmer, RN Phone Number: 10/10/2022, 12:27 PM    Transition of Care Department [facility]) has reviewed patient and no TOC needs have been identified at this time. We will continue to monitor patient advancement through interdisciplinary progression rounds. If new patient transition needs arise, please place a TOC consult.          Patient Goals and CMS Choice        Expected Discharge Plan and Services                                                Prior Living Arrangements/Services                       Activities of Daily Living Home Assistive Devices/Equipment: Oxygen, Scales, Grab bars in shower, Grab bars around toilet, Raised toilet seat with rails, Walker (specify type), Dentures (specify type), CBG Meter ADL Screening (condition at time of admission) Patient's cognitive ability adequate to safely complete daily activities?: Yes Is the patient deaf or have difficulty hearing?: No Does the patient have difficulty seeing, even when wearing glasses/contacts?: No Does the patient have difficulty concentrating, remembering, or making decisions?: No Patient able to express need for assistance with ADLs?: Yes Does the patient have difficulty dressing or bathing?: Yes Independently performs ADLs?: No Communication: Independent Dressing (OT): Needs assistance Is this a change from baseline?: Pre-admission baseline Grooming: Needs assistance Is this a change from baseline?: Pre-admission baseline Feeding:  Independent Bathing: Needs assistance Is this a change from baseline?: Pre-admission baseline Toileting: Needs assistance Is this a change from baseline?: Pre-admission baseline In/Out Bed: Needs assistance, Dependent Is this a change from baseline?: Pre-admission baseline Walks in Home: Needs assistance Is this a change from baseline?: Pre-admission baseline Does the patient have difficulty walking or climbing stairs?: No Weakness of Legs: Both Weakness of Arms/Hands: None  Permission Sought/Granted                  Emotional Assessment              Admission diagnosis:  Cellulitis [W29.56] Acute systolic congestive heart failure (HCC) [I50.21] Idiopathic gout, unspecified chronicity, unspecified site [M10.00] Acute sepsis ([place]) [A41.9] Patient Active Problem List   Diagnosis Date Noted   Cellulitis 10/09/2022   Pain and swelling of right upper extremity 10/09/2022   Stage 3a chronic kidney disease ([place]) 08/18/2022   AKI (acute kidney injury) ([place]) 06/27/2022   Recurrent venous ulcer of lower extremity ([place]) 06/27/2022   Raynaud's disease without gangrene 06/02/2022   Left leg swelling 01/13/2022   Lymphedema, not elsewhere classified 12/22/2021   Acute on chronic diastolic CHF (congestive heart failure) ([place]) 12/11/2021   Cor pulmonale ([place]) 11/19/2021   Edema 09/10/2021   Pulmonary hypertension ([place]) 09/10/2021   Venous stasis ulcer of right  calf ([place]) 09/10/2021   Aortic atherosclerosis ([place]) 01/11/2021   Hashimoto's thyroiditis 01/10/2021   Hypothyroidism 07/06/2020   Chronic hypoxemic respiratory failure ([place]) 07/06/2020   Foul smelling urine 12/29/2018   Prediabetes 09/06/2018   History of melanoma 07/22/2018   Thoracic ascending aortic aneurysm ([place]) 04/07/2018   Mood disorder ([place]) 03/02/2018   Status post total bilateral knee replacement 12/03/2017   Preventative health care 08/20/2017   Urinary incontinence 08/20/2017   Advance directive  discussed with patient 08/20/2017   Status post reverse total shoulder replacement, right 05/28/2017   Obesity, Class III, BMI 40-49.9 (morbid obesity) ([place]) 05/22/2016   COPD (chronic obstructive pulmonary disease) ([place]) 04/16/2016   Generalized weakness 04/16/2016   Hypertension    Tremor, essential    OSA (obstructive sleep apnea)    RLS (restless legs syndrome)    Raynaud disease    Osteoarthrosis, generalized, multiple joints    Type 2 diabetes mellitus with diabetic neuropathy, unspecified ([place]) 12/23/1999   PCP:  Venia Carbon, MD Pharmacy:   [facility], [address] [place] [address] Phone: [phone] Fax: [place] [id number] [address], [place] [address] Phone: [phone] Fax: [phone]     Social Determinants of Health (SDOH) Interventions    Readmission Risk Interventions     No data to display

## 2022-10-11 DIAGNOSIS — L03113 Cellulitis of right upper limb: Secondary | ICD-10-CM | POA: Diagnosis not present

## 2022-10-11 LAB — CBC
HCT: 45.4 % (ref 36.0–46.0)
Hemoglobin: 14.4 g/dL (ref 12.0–15.0)
MCH: 31.4 pg (ref 26.0–34.0)
MCHC: 31.7 g/dL (ref 30.0–36.0)
MCV: 99.1 fL (ref 80.0–100.0)
Platelets: 167 10*3/uL (ref 150–400)
RBC: 4.58 MIL/uL (ref 3.87–5.11)
RDW: 16.5 % — ABNORMAL HIGH (ref 11.5–15.5)
WBC: 10.2 10*3/uL (ref 4.0–10.5)
nRBC: 0 % (ref 0.0–0.2)

## 2022-10-11 LAB — BASIC METABOLIC PANEL
Anion gap: 11 (ref 5–15)
BUN: 28 mg/dL — ABNORMAL HIGH (ref 8–23)
CO2: 25 mmol/L (ref 22–32)
Calcium: 9.1 mg/dL (ref 8.9–10.3)
Chloride: 99 mmol/L (ref 98–111)
Creatinine, Ser: 1.14 mg/dL — ABNORMAL HIGH (ref 0.44–1.00)
GFR, Estimated: 51 mL/min — ABNORMAL LOW (ref >60)
Glucose, Bld: 105 mg/dL — ABNORMAL HIGH (ref 70–99)
Potassium: 4.5 mmol/L (ref 3.5–5.1)
Sodium: 135 mmol/L (ref 135–145)

## 2022-10-11 LAB — MAGNESIUM: Magnesium: 2.3 mg/dL (ref 1.7–2.4)

## 2022-10-11 NOTE — Progress Notes (Signed)
  PROGRESS NOTE    Kari Medina  BSW:[id number] DOB: 05-24-1946 DOA: 10/09/2022 PCP: Venia Carbon, MD  229A/229A-AA  LOS: 1 day   Brief hospital course:   Assessment & Plan: Kari Medina is a 76 year old female with history of pulmonary hypertension, chronic O2 requirement, depression, anxiety, non-insulin-dependent diabetes mellitus, hypothyroid, who presented emergency department for chief concerns of right arm swelling.   * Cellulitis -right hand edematous and painful with movement - CT of the right hand showed soft tissue edema and Arthritis at the wrist and digits --started on ceftriaxone on admission, some improvement in pain and erythema today Plan: -cont ceftriaxone  Chronic hypoxemic respiratory failure (HCC) - At baseline patient is on 3 L nasal cannula   Pulmonary hypertension ([place]) --cont home aldactone and torsemide  Recurrent venous ulcer of lower extremity (HCC) --a few areas on both legs, small and not infected looking.  Followed by outpatient wound care.   Plan: --order dressing change with xeroform   Stage 3a chronic kidney disease ([place]) - At baseline  Hypothyroidism - Levothyroxine 200 mcg daily   RLS (restless legs syndrome) - cont home ropinirole 3 mg nightly  OSA (obstructive sleep apnea) - CPAP nightly  Tremor, essential - At baseline  Hypertension --cont home aldactone and torsemide   DVT prophylaxis: Lovenox SQ Code Status: Full code  Family Communication: husband and daughter updated at bedside today Level of care: Med-Surg Dispo:   The patient is from: home Anticipated d/c is to: home Anticipated d/c date is: 2-3 days   Subjective and Interval History:  Pt reported pain in her right hand/wrist improved some, now can move it without too much pain.     Objective: Vitals:   10/10/22 2021 10/11/22 0555 10/11/22 0831 10/11/22 1651  BP: 110/76 97/61 107/78 102/69  Pulse: 96 98 97 85  Resp: '18 20 14 12  '$ Temp: 98.1  F (36.7 C) 97.9 F (36.6 C) 97.8 F (36.6 C) 98.1 F (36.7 C)  TempSrc: Oral Oral  Oral  SpO2: 91% 91% (!) 81% 96%  Weight:        Intake/Output Summary (Last 24 hours) at 10/11/2022 1932 Last data filed at 10/11/2022 1927 Gross per 24 hour  Intake 580.4 ml  Output 350 ml  Net 230.4 ml   Filed Weights   10/10/22 1200  Weight: 89.4 kg    Examination:   Constitutional: NAD, AAOx3 HEENT: conjunctivae and lids normal, EOMI CV: No cyanosis.   RESP: normal respiratory effort Extremities: right hand and forearm edematous, erythema improved from yesterday, less pain to movement or palpation SKIN: warm, dry Neuro: II - XII grossly intact.   Psych: Normal mood and affect.  Appropriate judgement and reason   Data Reviewed: I have personally reviewed labs and imaging studies  Time spent: 35 minutes  Enzo Bi, MD [facility] If 7PM-7AM, please contact night-coverage 10/11/2022, 7:32 PM

## 2022-10-11 NOTE — Progress Notes (Signed)
Cpap refused 

## 2022-10-11 NOTE — Assessment & Plan Note (Signed)
--  a few areas on both legs, small and not infected looking.  Followed by outpatient wound care.   Plan: --order dressing change with xeroform

## 2022-10-12 DIAGNOSIS — L03113 Cellulitis of right upper limb: Secondary | ICD-10-CM | POA: Diagnosis not present

## 2022-10-12 LAB — BASIC METABOLIC PANEL
Anion gap: 9 (ref 5–15)
BUN: 31 mg/dL — ABNORMAL HIGH (ref 8–23)
CO2: 26 mmol/L (ref 22–32)
Calcium: 9 mg/dL (ref 8.9–10.3)
Chloride: 101 mmol/L (ref 98–111)
Creatinine, Ser: 1.15 mg/dL — ABNORMAL HIGH (ref 0.44–1.00)
GFR, Estimated: 50 mL/min — ABNORMAL LOW (ref >60)
Glucose, Bld: 101 mg/dL — ABNORMAL HIGH (ref 70–99)
Potassium: 4.4 mmol/L (ref 3.5–5.1)
Sodium: 136 mmol/L (ref 135–145)

## 2022-10-12 LAB — CBC
HCT: 44.9 % (ref 36.0–46.0)
Hemoglobin: 14.8 g/dL (ref 12.0–15.0)
MCH: 31.8 pg (ref 26.0–34.0)
MCHC: 33 g/dL (ref 30.0–36.0)
MCV: 96.6 fL (ref 80.0–100.0)
Platelets: 167 10*3/uL (ref 150–400)
RBC: 4.65 MIL/uL (ref 3.87–5.11)
RDW: 16.3 % — ABNORMAL HIGH (ref 11.5–15.5)
WBC: 8.9 10*3/uL (ref 4.0–10.5)
nRBC: 0.2 % (ref 0.0–0.2)

## 2022-10-12 LAB — MAGNESIUM: Magnesium: 2.4 mg/dL (ref 1.7–2.4)

## 2022-10-12 MED ORDER — BISACODYL 10 MG RE SUPP
10.0000 mg | Freq: Once | RECTAL | Status: AC
  Administered 2022-10-12: 10 mg via RECTAL
  Filled 2022-10-12: qty 1

## 2022-10-12 NOTE — TOC CM/SW Note (Signed)
Went by room. Patient receiving patient care. Will try again later.  Kari Medina, [place]

## 2022-10-12 NOTE — TOC Initial Note (Signed)
Transition of Care [facility]) - Initial/Assessment Note    Patient Details  Name: Kari Medina MRN: [id number] Date of Birth: [birth year]  Transition of Care [facility]) CM/SW Contact:    Candie Chroman, LCSW Phone Number: 10/12/2022, 1:29 PM  Clinical Narrative:    CSW met with patient. Husband at bedside. CSW introduced role and explained that discharge planning would be discussed. Patient is active with [facility] for PT and RN. They can add SW to assist husband with finding personal care services. Patient and husband asked about getting a IT trainer wheelchair. Per [employer] liaison, it takes around 3 months to get insurance approval for those. Patient does not have a manual wheelchair. Left message for [employer] liaison to see if we can order one for her while she is waiting on electric wheelchair. No further concerns. CSW encouraged patient and her husband to contact CSW as needed. CSW will continue to follow patient for support and facilitate return home when stable. Husband will transport her home at discharge. He will bring her portable oxygen for the car ride ([employer]).              Expected Discharge Plan: [place] Barriers to Discharge: Continued Medical Work up   Patient Goals and CMS Choice     Choice offered to / list presented to : NA  Expected Discharge Plan and Services Expected Discharge Plan: [place] Choice: Durable Medical Equipment, Resumption of Svcs/PTA Provider Living arrangements for the past 2 months: Single Family Home                           HH Arranged: RN, PT, Social Work [employer] Agency: [place] Date [place]: 10/12/22   Representative spoke with at [place]: Malachy Mood ROse  Prior Living Arrangements/Services Living arrangements for the past 2 months: [place] Lives with:: Spouse Patient language and need for interpreter reviewed:: Yes Do you feel  safe going back to the place where you live?: Yes      Need for Family Participation in Patient Care: Yes (Comment) Care giver support system in place?: Yes (comment) Current home services: DME, Home PT, Home RN Criminal Activity/Legal Involvement Pertinent to Current Situation/Hospitalization: No - Comment as needed  Activities of Daily Living Home Assistive Devices/Equipment: Oxygen, Scales, Grab bars in shower, Grab bars around toilet, Raised toilet seat with rails, Walker (specify type), Dentures (specify type), CBG Meter ADL Screening (condition at time of admission) Patient's cognitive ability adequate to safely complete daily activities?: Yes Is the patient deaf or have difficulty hearing?: No Does the patient have difficulty seeing, even when wearing glasses/contacts?: No Does the patient have difficulty concentrating, remembering, or making decisions?: No Patient able to express need for assistance with ADLs?: Yes Does the patient have difficulty dressing or bathing?: Yes Independently performs ADLs?: No Communication: Independent Dressing (OT): Needs assistance Is this a change from baseline?: Pre-admission baseline Grooming: Needs assistance Is this a change from baseline?: Pre-admission baseline Feeding: Independent Bathing: Needs assistance Is this a change from baseline?: Pre-admission baseline Toileting: Needs assistance Is this a change from baseline?: Pre-admission baseline In/Out Bed: Needs assistance, Dependent Is this a change from baseline?: Pre-admission baseline Walks in Home: Needs assistance Is this a change from baseline?: Pre-admission baseline Does the patient have difficulty walking or climbing stairs?: No Weakness of Legs: Both Weakness of  Arms/Hands: None  Permission Sought/Granted Permission sought to share information with : Facility Art therapist granted to share information with : Yes, Verbal Permission Granted  Share  Information with NAME: Ming Kunka  Permission granted to share info w AGENCY: [place] granted to share info w Relationship: Spouse  Permission granted to share info w Contact Information: [phone]  Emotional Assessment Appearance:: Appears stated age Attitude/Demeanor/Rapport: Engaged, Gracious Affect (typically observed): Accepting, Appropriate, Calm, Pleasant Orientation: : Oriented to Self, Oriented to Place, Oriented to  Time, Oriented to Situation Alcohol / Substance Use: Not Applicable Psych Involvement: No (comment)  Admission diagnosis:  Cellulitis [W86.77] Acute systolic congestive heart failure (HCC) [I50.21] Idiopathic gout, unspecified chronicity, unspecified site [M10.00] Acute sepsis ([place]) [A41.9] Patient Active Problem List   Diagnosis Date Noted   Cellulitis 10/09/2022   Stage 3a chronic kidney disease ([place]) 08/18/2022   AKI (acute kidney injury) ([place]) 06/27/2022   Recurrent venous ulcer of lower extremity ([place]) 06/27/2022   Raynaud's disease without gangrene 06/02/2022   Left leg swelling 01/13/2022   Lymphedema, not elsewhere classified 12/22/2021   Acute on chronic diastolic CHF (congestive heart failure) ([place]) 12/11/2021   Cor pulmonale ([place]) 11/19/2021   Edema 09/10/2021   Pulmonary hypertension ([place]) 09/10/2021   Venous stasis ulcer of right calf ([place]) 09/10/2021   Aortic atherosclerosis ([place]) 01/11/2021   Hashimoto's thyroiditis 01/10/2021   Hypothyroidism 07/06/2020   Chronic hypoxemic respiratory failure ([place]) 07/06/2020   Foul smelling urine 12/29/2018   Prediabetes 09/06/2018   History of melanoma 07/22/2018   Thoracic ascending aortic aneurysm ([place]) 04/07/2018   Mood disorder ([place]) 03/02/2018   Status post total bilateral knee replacement 12/03/2017   Preventative health care 08/20/2017   Urinary incontinence 08/20/2017   Advance directive discussed with patient 08/20/2017   Status post reverse total shoulder  replacement, right 05/28/2017   Obesity, Class III, BMI 40-49.9 (morbid obesity) ([place]) 05/22/2016   COPD (chronic obstructive pulmonary disease) (HCC) 04/16/2016   Generalized weakness 04/16/2016   Hypertension    Tremor, essential    OSA (obstructive sleep apnea)    RLS (restless legs syndrome)    Raynaud disease    Osteoarthrosis, generalized, multiple joints    Type 2 diabetes mellitus with diabetic neuropathy, unspecified ([place]) 12/23/1999   PCP:  Venia Carbon, MD Pharmacy:   [facility], [address] [place] [address] Phone: [phone] Fax: [phone]  [facility] [id number], [place] [address] Phone: [phone] Fax: [phone]     Social Determinants of Health (SDOH) Interventions    Readmission Risk Interventions     No data to display

## 2022-10-12 NOTE — Progress Notes (Addendum)
[facility] [place] [facility] [place] NAME: Kari Medina    MR#:  [id number]  DATE OF BIRTH:  12-04-1946  SUBJECTIVE:  husband and daughter at bedside. Patient came in with increasing swelling of the right hand with redness. Improving now. No fever. Patient at baseline is bedbound. Husband helps her out at home.    VITALS:  Blood pressure 101/83, pulse 97, temperature (!) 97.5 F (36.4 C), resp. rate 16, weight 89.4 kg, SpO2 90 %.  PHYSICAL EXAMINATION:   GENERAL:  76 y.o.-year-old patient lying in the bed with no acute distress.  LUNGS: Normal breath sounds bilaterally, no wheezing CARDIOVASCULAR: S1, S2 normal. No murmurs,   ABDOMEN: Soft, nontender, nondistended. Bowel sounds present.  EXTREMITIES: right UE edema+, redness resolved. DJD + fingers NEUROLOGIC: nonfocal  patient is alert and awake SKIN: No obvious rash, lesion, or ulcer.   LABORATORY PANEL:  CBC Recent Labs  Lab 10/12/22 0527  WBC 8.9  HGB 14.8  HCT 44.9  PLT 167    Chemistries  Recent Labs  Lab 10/09/22 1647 10/10/22 0542 10/12/22 0527  NA 135   < > 136  K 3.7   < > 4.4  CL 97*   < > 101  CO2 25   < > 26  GLUCOSE 105*   < > 101*  BUN 20   < > 31*  CREATININE 1.02*   < > 1.15*  CALCIUM 9.0   < > 9.0  MG  --    < > 2.4  AST 29  --   --   ALT 15  --   --   ALKPHOS 117  --   --   BILITOT 3.1*  --   --    < > = values in this interval not displayed.   Assessment and Plan Kari Medina is a 76 year old female with history of pulmonary hypertension, chronic O2 requirement, depression, anxiety, non-insulin-dependent diabetes mellitus, hypothyroid, who presented emergency department for chief concerns of right arm swelling.   Cellulitis -right hand edematous and painful with movement - CT of the right hand showed soft tissue edema and Arthritis at the wrist and digits --started on ceftriaxone on admission, some improvement in pain and erythema today -change to po in  am --keep hand elevated --No sepsis   Chronic hypoxemic respiratory failure (HCC) - At baseline patient is on 3 L nasal cannula    Pulmonary hypertension ([place]) --cont home aldactone and torsemide   Recurrent venous ulcer of lower extremity (HCC) --a few areas on both legs, small and not infected looking.  Followed by outpatient wound care.   --order dressing change with xeroform    Stage 3a chronic kidney disease (HCC) - At baseline   Hypothyroidism - Levothyroxine 200 mcg daily    RLS (restless legs syndrome) - cont home ropinirole 3 mg nightly   OSA (obstructive sleep apnea) - CPAP nightly   Tremor, essential - At baseline   Hypertension --cont home aldactone and torsemide     DVT prophylaxis: Lovenox SQ Code Status: Full code  Family Communication: husband and daughter updated at bedside today Level of care: Med-Surg Dispo:   The patient is from: home Anticipated d/c is to: home with [facility] tomorrow  TOC for assistance with WC     TOTAL TIME TAKING CARE OF THIS PATIENT: 35 minutes.  >50% time spent on counselling and coordination of care  Note: This dictation was prepared with Dragon dictation along with  smaller phrase technology. Any transcriptional errors that result from this process are unintentional.  Fritzi Mandes M.D    [facility]   CC: Primary care physician; Venia Carbon, MD

## 2022-10-13 DIAGNOSIS — L03011 Cellulitis of right finger: Secondary | ICD-10-CM | POA: Diagnosis not present

## 2022-10-13 DIAGNOSIS — I10 Essential (primary) hypertension: Secondary | ICD-10-CM | POA: Diagnosis not present

## 2022-10-13 DIAGNOSIS — I272 Pulmonary hypertension, unspecified: Secondary | ICD-10-CM | POA: Diagnosis not present

## 2022-10-13 MED ORDER — CEFDINIR 300 MG PO CAPS
300.0000 mg | ORAL_CAPSULE | Freq: Two times a day (BID) | ORAL | Status: DC
  Filled 2022-10-13: qty 1

## 2022-10-13 MED ORDER — CEFDINIR 300 MG PO CAPS: 300.0000 mg | ORAL_CAPSULE | Freq: Two times a day (BID) | ORAL | 0 refills | Status: DC

## 2022-10-13 MED ORDER — HYDROCODONE-ACETAMINOPHEN 5-325 MG PO TABS: 0.5000 | ORAL_TABLET | Freq: Three times a day (TID) | ORAL | 0 refills | Status: DC | PRN

## 2022-10-13 NOTE — Discharge Summary (Signed)
Physician Discharge Summary   Patient: Kari Medina MRN: [id number] DOB: [birth year]  Admit date:     10/09/2022  Discharge date: 10/13/22  Discharge Physician: Fritzi Mandes   PCP: Venia Carbon, MD   Recommendations at discharge:    F/u PCP in 1-2 weeks  Discharge Diagnoses: Principal Problem:   Cellulitis Active Problems:   Chronic hypoxemic respiratory failure ([place])   Pulmonary hypertension (HCC)   Recurrent venous ulcer of lower extremity ([place])   Hypertension   Tremor, essential   OSA (obstructive sleep apnea)   RLS (restless legs syndrome)   Raynaud disease   Hypothyroidism   Hashimoto's thyroiditis   Raynaud's disease without gangrene   Stage 3a chronic kidney disease [facility])   Hospital Course:  Kari Medina is a 76 year old female with history of pulmonary hypertension, chronic O2 requirement, depression, anxiety, non-insulin-dependent diabetes mellitus, hypothyroid, who presented emergency department for chief concerns of right arm swelling.    Cellulitis Severe DJD right hand - right hand edematous and painful with movement on admisison - CT of the right hand showed soft tissue edema and Arthritis at the wrist and digits --started on ceftriaxone on admission, some improvement in pain and erythema today--change to po abxs --keep hand elevated, warm compresses and cont PT --No sepsis   Chronic hypoxemic respiratory failure (HCC) - At baseline patient is on 3 L nasal cannula    Pulmonary hypertension (HCC) --cont home aldactone and torsemide   Recurrent venous ulcer of lower extremity (HCC) --a few areas on both legs, small and not infected looking.  Followed by outpatient wound care.   --order dressing change with xeroform    Stage 3a chronic kidney disease (HCC) - At baseline   Hypothyroidism - Levothyroxine 200 mcg daily    RLS (restless legs syndrome) - cont home ropinirole 3 mg nightly   OSA (obstructive sleep apnea) - CPAP nightly    Tremor, essential - At baseline   Hypertension --cont home aldactone and torsemide     DVT prophylaxis: Lovenox SQ Code Status: Full code  Family Communication: husband updated at bedside today Level of care: Med-Surg Dispo:   The patient is from: home Anticipated d/c is to: home with [facility] today   TOC for assistance with WC and HH      Pain control - [facility] Controlled Substance Reporting System database was reviewed. and patient was instructed, not to drive, operate heavy machinery, perform activities at heights, swimming or participation in water activities or provide baby-sitting services while on Pain, Sleep and Anxiety Medications; until their outpatient Physician has advised to do so again. Also recommended to not to take more than prescribed Pain, Sleep and Anxiety Medications.  Disposition: Home health Diet recommendation:  Discharge Diet Orders (From admission, onward)     Start     Ordered   10/13/22 0000  Diet - low sodium heart healthy        10/13/22 1018           Cardiac diet DISCHARGE MEDICATION: Allergies as of 10/13/2022       Reactions   Latex Hives   Tape and bandaids only   Nickel Dermatitis   Pramipexole Other (See Comments)   Caused hallucinations, says she can take name brand.   Prednisone    Makes her "feel crazy" - can take low dosage    Topiramate Other (See Comments)   "spaced out"   Citalopram Anxiety   Paroxetine Hcl Anxiety   Sertraline Anxiety  Medication List     STOP taking these medications    ketoconazole 2 % cream Commonly known as: NIZORAL       TAKE these medications    acetaminophen 650 MG CR tablet Commonly known as: TYLENOL Take 650 mg by mouth every 8 (eight) hours as needed for pain.   ALPRAZolam 0.25 MG tablet Commonly known as: XANAX Take 1 tablet (0.25 mg total) by mouth 2 (two) times daily as needed for anxiety.   aspirin EC 81 MG tablet Take 81 mg by mouth at bedtime.    budesonide-formoterol 160-4.5 MCG/ACT inhaler Commonly known as: SYMBICORT Inhale 2 puffs into the lungs 2 (two) times daily.   cefdinir 300 MG capsule Commonly known as: OMNICEF Take 1 capsule (300 mg total) by mouth every 12 (twelve) hours for 3 days.   DULoxetine 60 MG capsule Commonly known as: CYMBALTA TAKE 1 CAPSULE BY MOUTH  DAILY   HYDROcodone-acetaminophen 5-325 MG tablet Commonly known as: NORCO/VICODIN Take 0.5-1 tablets by mouth 3 (three) times daily as needed for moderate pain.   levothyroxine 200 MCG tablet Commonly known as: SYNTHROID TAKE 1 TABLET BY MOUTH DAILY  BEFORE BREAKFAST   metFORMIN 500 MG 24 hr tablet Commonly known as: GLUCOPHAGE-XR TAKE 1 TABLET BY MOUTH  DAILY WITH BREAKFAST   montelukast 10 MG tablet Commonly known as: SINGULAIR TAKE 1 TABLET BY MOUTH AT  BEDTIME   OneTouch Delica Lancets Fine Misc 1 each by Does not apply route daily. Use to check blood sugar once a day. E11.9   OneTouch Verio test strip Generic drug: glucose blood Use to check blood sugar once a day. E11.9   rOPINIRole 3 MG tablet Commonly known as: REQUIP Take 1 tablet (3 mg total) by mouth at bedtime.   spironolactone 25 MG tablet Commonly known as: ALDACTONE Take 1 tablet (25 mg total) by mouth daily.   torsemide 20 MG tablet Commonly known as: DEMADEX Take 1 tablet (20 mg total) by mouth 2 (two) times daily. Hold 2nd dose if home weight under 190#               Discharge Care Instructions  (From admission, onward)           Start     Ordered   10/13/22 0000  Discharge wound care:       Comments: Active Pressure Injury/Wound(s)     Pressure Ulcer  Duration         Pressure Injury 10/12/22 Buttocks Left Stage 2 -  Partial thickness loss of dermis presenting as a shallow open injury with a red, pink wound bed without slough. small open wound, pink <1 day      Wound Care Orders (From admission, onward)      Start     Ordered   10/12/22 0500     Wound care  Daily at 5am      Comments: Apply xeroform to venous stasis wounds, then cover.  Change daily.  10/11/22 1214   10/13/22 1018            Follow-up Information     Venia Carbon, MD. Schedule an appointment as soon as possible for a visit in 1 week(s).   Specialties: Internal Medicine, Pediatrics Why: hospital f/u Contact information: [place] [address] [phone]                Discharge Exam: Danley Danker Weights   10/10/22 1200  Weight: 89.4 kg  Condition at discharge: fair  The results of significant diagnostics from this hospitalization (including imaging, microbiology, ancillary and laboratory) are listed below for reference.   Imaging Studies: US Venous Img Upper Uni Right(DVT)  Result Date: 10/10/2022 CLINICAL DATA:  Arm swelling and pain for 2 days EXAM: RIGHT UPPER EXTREMITY VENOUS DOPPLER ULTRASOUND TECHNIQUE: Gray-scale sonography with graded compression, as well as color Doppler and duplex ultrasound were performed to evaluate the upper extremity deep venous system from the level of the subclavian vein and including the jugular, axillary, basilic, radial, ulnar and upper cephalic vein. Spectral Doppler was utilized to evaluate flow at rest and with distal augmentation maneuvers. COMPARISON:  None Available. FINDINGS: Contralateral Subclavian Vein: Respiratory phasicity is normal and symmetric with the symptomatic side. No evidence of thrombus. Normal compressibility. Internal Jugular Vein: No evidence of thrombus. Normal compressibility, respiratory phasicity and response to augmentation. Subclavian Vein: No evidence of thrombus. Normal compressibility, respiratory phasicity and response to augmentation. Axillary Vein: No evidence of thrombus. Normal compressibility, respiratory phasicity and response to augmentation. Cephalic Vein: No evidence of thrombus. Normal compressibility, respiratory phasicity and response to  augmentation. Basilic Vein: No evidence of thrombus. Normal compressibility, respiratory phasicity and response to augmentation. Brachial Veins: No evidence of thrombus. Normal compressibility, respiratory phasicity and response to augmentation. Radial Veins: No evidence of thrombus. Normal compressibility, respiratory phasicity and response to augmentation. Ulnar Veins: No evidence of thrombus. Normal compressibility, respiratory phasicity and response to augmentation. IMPRESSION: No evidence of DVT within the right upper extremity. Electronically Signed   By: Jerilynn Mages.  Shick M.D.   On: 10/10/2022 09:29   CT HAND RIGHT W CONTRAST  Result Date: 10/09/2022 CLINICAL DATA:  Soft tissue mass swelling EXAM: CT OF THE UPPER RIGHT EXTREMITY WITH CONTRAST TECHNIQUE: Multidetector CT imaging of the upper right extremity was performed according to the standard protocol following intravenous contrast administration. RADIATION DOSE REDUCTION: This exam was performed according to the departmental dose-optimization program which includes automated exposure control, adjustment of the mA and/or kV according to patient size and/or use of iterative reconstruction technique. CONTRAST:  171m OMNIPAQUE IOHEXOL 300 MG/ML  SOLN COMPARISON:  Radiograph 10/09/2022 FINDINGS: Bones/Joint/Cartilage No fracture or malalignment. No periostitis or osseous destructive change. Advanced joint space narrowing and osteophyte at the first [facility]. Moderate joint space narrowing at the STT interval and second CMC joint. Severe arthritis at the second PIP joint and third D IP joint. Mild to moderate joint space narrowing and arthritis at the remaining IP joints. Chronic appearing calcification adjacent to the pisiform. Ossicle at the ulnar styloid. Ligaments Suboptimally assessed by CT. Muscles and Tendons No obvious intramuscular fluid collections. No significant atrophy. Probable edema at the carpal tunnel. Soft tissues Considerable soft tissue edema  primarily along the dorsum of the wrist and hand. No rim enhancing fluid collection to suggest abscess. No soft tissue emphysema. IMPRESSION: 1. Considerable soft tissue edema greatest along the dorsum of the hand and wrist, suggestive of cellulitis. Negative for rim enhancing soft tissue abscess or gas collections. 2. Arthritis at the wrist and digits. There is no osseous destructive change, erosion or periostitis to suggest osteomyelitis. MRI follow-up if continued concern. Electronically Signed   By: KDonavan FoilM.D.   On: 10/09/2022 19:47   CT WRIST RIGHT W CONTRAST  Result Date: 10/09/2022 CLINICAL DATA:  Soft tissue mass swelling EXAM: CT OF THE UPPER RIGHT EXTREMITY WITH CONTRAST TECHNIQUE: Multidetector CT imaging of the upper right extremity was performed according to the standard protocol following  intravenous contrast administration. RADIATION DOSE REDUCTION: This exam was performed according to the departmental dose-optimization program which includes automated exposure control, adjustment of the mA and/or kV according to patient size and/or use of iterative reconstruction technique. CONTRAST:  173m OMNIPAQUE IOHEXOL 300 MG/ML  SOLN COMPARISON:  Radiograph 10/09/2022 FINDINGS: Bones/Joint/Cartilage No fracture or malalignment. No periostitis or osseous destructive change. Advanced joint space narrowing and osteophyte at the first [facility]. Moderate joint space narrowing at the STT interval and second CMC joint. Severe arthritis at the second PIP joint and third D IP joint. Mild to moderate joint space narrowing and arthritis at the remaining IP joints. Chronic appearing calcification adjacent to the pisiform. Ossicle at the ulnar styloid. Ligaments Suboptimally assessed by CT. Muscles and Tendons No obvious intramuscular fluid collections. No significant atrophy. Probable edema at the carpal tunnel. Soft tissues Considerable soft tissue edema primarily along the dorsum of the wrist and hand. No  rim enhancing fluid collection to suggest abscess. No soft tissue emphysema. IMPRESSION: 1. Considerable soft tissue edema greatest along the dorsum of the hand and wrist, suggestive of cellulitis. Negative for rim enhancing soft tissue abscess or gas collections. 2. Arthritis at the wrist and digits. There is no osseous destructive change, erosion or periostitis to suggest osteomyelitis. MRI follow-up if continued concern. Electronically Signed   By: KDonavan FoilM.D.   On: 10/09/2022 19:47   DG Hand Complete Right  Result Date: 10/09/2022 CLINICAL DATA:  Pain, swelling, and redness. Right hand and wrist are high to touch. EXAM: RIGHT HAND - COMPLETE 3+ VIEW COMPARISON:  None Available. FINDINGS: Severe thumb carpometacarpal, index finger PIP, and third digit DIP joint space narrowing, subchondral sclerosis/cystic change, and peripheral osteophytosis. Mild-to-moderate lateral aspect of the thumb interphalangeal joint space narrowing. Mild-to-moderate joint space narrowing of the other interphalangeal joints. Moderate triscaphe and second carpometacarpal joint space narrowing. Tiny well corticated chronic ossicle distal to the ulnar styloid, the sequela of remote trauma. Moderate dorsal hand soft tissue swelling centered at the metacarpal heads. No cortical erosion or acute fracture is seen.  No dislocation. IMPRESSION: 1. Severe thumb carpometacarpal, index finger PIP, and third digit DIP osteoarthritis. 2. Moderate triscaphe and second carpometacarpal osteoarthritis. 3. No cortical erosion is seen. Electronically Signed   By: RYvonne KendallM.D.   On: 10/09/2022 16:56   DG Chest Portable 1 View  Result Date: 10/09/2022 CLINICAL DATA:  Difficulty breathing.  Chest pressure on right side. EXAM: PORTABLE CHEST 1 VIEW COMPARISON:  Chest two views 06/27/2022 and CT chest 06/27/2022 FINDINGS: Cardiac silhouette is again markedly enlarged. Mediastinal contours are unchanged, with chronic enlargement of the main  pulmonary artery as can be seen with central pulmonary arterial hypertension seen on the prior CT, and ascending aortic aneurysm measuring up to 4.5 cm seen on prior CT. Mild calcification within the aortic arch. Mild curvilinear left mid to lower lung subsegmental atelectasis versus scarring is similar to prior CT. No definite pleural effusion. No pneumothorax. No acute skeletal abnormality. IMPRESSION: 1. No significant change from prior. 2. Marked cardiomegaly. 3. No acute lung process. Electronically Signed   By: RYvonne KendallM.D.   On: 10/09/2022 16:54    Microbiology: Results for orders placed or performed during the hospital encounter of 10/09/22  Resp Panel by RT-PCR (Flu A&B, Covid) Anterior Nasal Swab     Status: None   Collection Time: 10/09/22  4:47 PM   Specimen: Anterior Nasal Swab  Result Value Ref Range Status   SARS Coronavirus  2 by RT PCR NEGATIVE NEGATIVE Final    Comment: (NOTE) SARS-CoV-2 target nucleic acids are NOT DETECTED.  The SARS-CoV-2 RNA is generally detectable in upper respiratory specimens during the acute phase of infection. The lowest concentration of SARS-CoV-2 viral copies this assay can detect is 138 copies/mL. A negative result does not preclude SARS-Cov-2 infection and should not be used as the sole basis for treatment or other patient management decisions. A negative result may occur with  improper specimen collection/handling, submission of specimen other than nasopharyngeal swab, presence of viral mutation(s) within the areas targeted by this assay, and inadequate number of viral copies(<138 copies/mL). A negative result must be combined with clinical observations, patient history, and epidemiological information. The expected result is Negative.  Fact Sheet for Patients:  EntrepreneurPulse.com.au  Fact Sheet for Healthcare Providers:  IncredibleEmployment.be  This test is no t yet approved or cleared by the  [place] FDA and  has been authorized for detection and/or diagnosis of SARS-CoV-2 by FDA under an Emergency Use Authorization (EUA). This EUA will remain  in effect (meaning this test can be used) for the duration of the COVID-19 declaration under Section 564(b)(1) of the Act, 21 U.S.C.section 360bbb-3(b)(1), unless the authorization is terminated  or revoked sooner.       Influenza A by PCR NEGATIVE NEGATIVE Final   Influenza B by PCR NEGATIVE NEGATIVE Final    Comment: (NOTE) The Xpert Xpress SARS-CoV-2/FLU/RSV plus assay is intended as an aid in the diagnosis of influenza from Nasopharyngeal swab specimens and should not be used as a sole basis for treatment. Nasal washings and aspirates are unacceptable for Xpert Xpress SARS-CoV-2/FLU/RSV testing.  Fact Sheet for Patients: EntrepreneurPulse.com.au  Fact Sheet for Healthcare Providers: IncredibleEmployment.be  This test is not yet approved or cleared by the [place] FDA and has been authorized for detection and/or diagnosis of SARS-CoV-2 by FDA under an Emergency Use Authorization (EUA). This EUA will remain in effect (meaning this test can be used) for the duration of the COVID-19 declaration under Section 564(b)(1) of the Act, 21 U.S.C. section 360bbb-3(b)(1), unless the authorization is terminated or revoked.  Performed at [facility], [address] [place], [place] [address]   Culture, blood (single)     Status: None (Preliminary result)   Collection Time: 10/09/22  6:05 PM   Specimen: BLOOD  Result Value Ref Range Status   Specimen Description BLOOD BLOOD LEFT HAND  Final   Special Requests   Final    BOTTLES DRAWN AEROBIC AND ANAEROBIC Blood Culture adequate volume   Culture   Final    NO GROWTH 4 DAYS Performed at [facility], [address] [place], [place] [address]    Report Status PENDING  Incomplete    Labs: CBC: Recent Labs  Lab  10/09/22 1647 10/10/22 0542 10/11/22 0601 10/12/22 0527  WBC 12.7* 10.3 10.2 8.9  NEUTROABS 9.8*  --   --   --   HGB 14.4 14.6 14.4 14.8  HCT 43.4 44.3 45.4 44.9  MCV 95.8 95.7 99.1 96.6  PLT 142* 158 167 841   Basic Metabolic Panel: Recent Labs  Lab 10/09/22 1647 10/10/22 0542 10/11/22 0601 10/12/22 0527  NA 135 136 135 136  K 3.7 3.3* 4.5 4.4  CL 97* 99 99 101  CO2 '25 25 25 26  '$ GLUCOSE 105* 107* 105* 101*  BUN 20 21 28* 31*  CREATININE 1.02* 1.15* 1.14* 1.15*  CALCIUM 9.0 8.9 9.1 9.0  MG  --   --  2.3 2.4   Liver Function Tests: Recent Labs  Lab 10/09/22 1647  AST 29  ALT 15  ALKPHOS 117  BILITOT 3.1*  PROT 6.7  ALBUMIN 3.0*   CBG: Recent Labs  Lab 10/09/22 2221 10/10/22 1003  GLUCAP 106* 98    Discharge time spent: greater than 30 minutes.  Signed: Fritzi Mandes, MD [facility] 10/13/2022

## 2022-10-13 NOTE — TOC Transition Note (Signed)
Transition of Care [facility]) - CM/SW Discharge Note   Patient Details  Name: Kari Medina MRN: [id number] Date of Birth: 30-Dec-1945  [facility] CM/SW Contact:  Beverly Sessions, RN Phone Number: 10/13/2022, 11:12 AM   Clinical Narrative:     Patient to discharge today Husband at bed side for transport. Patient and husband decline to bring portable O2 for discharge transport.  Bedside RN and MD aware  Per zack with [employer] they are able provide a loaner manuel WC while electric WC is being process.  Met with patient and husband at bedside. They decline loaner at this time.  States her brother is bringing her one to try.  They will call [employer] directly if they decide they need the loaner.  Zack with [employer] updated   Final next level of care: [place] Barriers to Discharge: Continued Medical Work up   Patient Goals and CMS Choice     Choice offered to / list presented to : NA  Discharge Placement                       Discharge Plan and Services     Post Acute Care Choice: Durable Medical Equipment, Resumption of Svcs/PTA Provider                    HH Arranged: RN, PT, Social Work [employer] Agency: [place] Date [facility] Agency Contacted: 10/12/22   Representative spoke with at [place]: Malachy Mood ROse  Social Determinants of Health ([place]) Interventions     Readmission Risk Interventions     No data to display

## 2022-10-14 ENCOUNTER — Telehealth: Payer: Self-pay | Admitting: Internal Medicine

## 2022-10-14 ENCOUNTER — Telehealth: Payer: Self-pay

## 2022-10-14 DIAGNOSIS — F410 Panic disorder [episodic paroxysmal anxiety] without agoraphobia: Secondary | ICD-10-CM | POA: Diagnosis not present

## 2022-10-14 DIAGNOSIS — L03113 Cellulitis of right upper limb: Secondary | ICD-10-CM | POA: Diagnosis not present

## 2022-10-14 DIAGNOSIS — I872 Venous insufficiency (chronic) (peripheral): Secondary | ICD-10-CM | POA: Diagnosis not present

## 2022-10-14 DIAGNOSIS — F32A Depression, unspecified: Secondary | ICD-10-CM | POA: Diagnosis not present

## 2022-10-14 DIAGNOSIS — G250 Essential tremor: Secondary | ICD-10-CM | POA: Diagnosis not present

## 2022-10-14 DIAGNOSIS — J449 Chronic obstructive pulmonary disease, unspecified: Secondary | ICD-10-CM | POA: Diagnosis not present

## 2022-10-14 DIAGNOSIS — Z87891 Personal history of nicotine dependence: Secondary | ICD-10-CM | POA: Diagnosis not present

## 2022-10-14 DIAGNOSIS — E1159 Type 2 diabetes mellitus with other circulatory complications: Secondary | ICD-10-CM | POA: Diagnosis not present

## 2022-10-14 DIAGNOSIS — I129 Hypertensive chronic kidney disease with stage 1 through stage 4 chronic kidney disease, or unspecified chronic kidney disease: Secondary | ICD-10-CM | POA: Diagnosis not present

## 2022-10-14 DIAGNOSIS — I7300 Raynaud's syndrome without gangrene: Secondary | ICD-10-CM | POA: Diagnosis not present

## 2022-10-14 DIAGNOSIS — G4733 Obstructive sleep apnea (adult) (pediatric): Secondary | ICD-10-CM | POA: Diagnosis not present

## 2022-10-14 DIAGNOSIS — G8929 Other chronic pain: Secondary | ICD-10-CM | POA: Diagnosis not present

## 2022-10-14 DIAGNOSIS — I272 Pulmonary hypertension, unspecified: Secondary | ICD-10-CM | POA: Diagnosis not present

## 2022-10-14 DIAGNOSIS — E039 Hypothyroidism, unspecified: Secondary | ICD-10-CM | POA: Diagnosis not present

## 2022-10-14 DIAGNOSIS — M25841 Other specified joint disorders, right hand: Secondary | ICD-10-CM | POA: Diagnosis not present

## 2022-10-14 DIAGNOSIS — N1831 Chronic kidney disease, stage 3a: Secondary | ICD-10-CM | POA: Diagnosis not present

## 2022-10-14 DIAGNOSIS — M797 Fibromyalgia: Secondary | ICD-10-CM | POA: Diagnosis not present

## 2022-10-14 DIAGNOSIS — J9611 Chronic respiratory failure with hypoxia: Secondary | ICD-10-CM | POA: Diagnosis not present

## 2022-10-14 DIAGNOSIS — L97218 Non-pressure chronic ulcer of right calf with other specified severity: Secondary | ICD-10-CM | POA: Diagnosis not present

## 2022-10-14 DIAGNOSIS — E1151 Type 2 diabetes mellitus with diabetic peripheral angiopathy without gangrene: Secondary | ICD-10-CM | POA: Diagnosis not present

## 2022-10-14 DIAGNOSIS — L97818 Non-pressure chronic ulcer of other part of right lower leg with other specified severity: Secondary | ICD-10-CM | POA: Diagnosis not present

## 2022-10-14 DIAGNOSIS — E063 Autoimmune thyroiditis: Secondary | ICD-10-CM | POA: Diagnosis not present

## 2022-10-14 DIAGNOSIS — G2581 Restless legs syndrome: Secondary | ICD-10-CM | POA: Diagnosis not present

## 2022-10-14 DIAGNOSIS — M150 Primary generalized (osteo)arthritis: Secondary | ICD-10-CM | POA: Diagnosis not present

## 2022-10-14 LAB — CULTURE, BLOOD (SINGLE)
Culture: NO GROWTH
Special Requests: ADEQUATE

## 2022-10-14 NOTE — Telephone Encounter (Signed)
Collie Siad from [facility] called in stating that patient was discharged from hospital recently. She stated that the husband is burnt out. She wanting an order to be put in for a Education officer, museum to come out. She can be reached at [phone]. Thank you!

## 2022-10-14 NOTE — Telephone Encounter (Signed)
Tried to call pt home and spouse's cell. If they call back, please pass on the message about the appt next week vs home visit next week. Let me know what she decides so I can let Dr Silvio Pate know.

## 2022-10-14 NOTE — Telephone Encounter (Signed)
Transition Care Management Follow-up Telephone Call Date of discharge and from where: TCM [facility] 10-13-22 Dx: Cellulitis How have you been since you were released from the hospital? Doing ok  Any questions or concerns? No  Items Reviewed: Did the pt receive and understand the discharge instructions provided? Yes  Medications obtained and verified? Yes  Other? No  Any new allergies since your discharge? No  Dietary orders reviewed? Yes Do you have support at home? Yes   Home Care and Equipment/Supplies: Were home health services ordered? no If so, what is the name of the agency? na  Has the agency set up a time to come to the patient's home? not applicable Were any new equipment or medical supplies ordered?  No What is the name of the medical supply agency? na Were you able to get the supplies/equipment? not applicable Do you have any questions related to the use of the equipment or supplies? No  Functional Questionnaire: (I = Independent and D = Dependent) ADLs: I-assistance   Bathing/Dressing- I-assistance   Meal Prep- D  Eating- I  Maintaining continence- D  Transferring/Ambulation- I-rollator  Managing Meds- I  Follow up appointments reviewed:  PCP Hospital f/u appt confirmed? Yes  Scheduled to see Dr Silvio Pate on 10-20-22 @ [facility] f/u appt confirmed? No  . Are transportation arrangements needed? No  If their condition worsens, is the pt aware to call PCP or go to the Emergency Dept.? Yes Was the patient provided with contact information for the PCP's office or ED? Yes Was to pt encouraged to call back with questions or concerns? Yes   Juanda Crumble LPN [place] Direct Dial [phone]

## 2022-10-14 NOTE — Telephone Encounter (Signed)
Spoke to [place]. She will set up SW evaluation. Kari Medina pt will most likely deny PT.

## 2022-10-15 MED ORDER — CEFDINIR 300 MG PO CAPS: 300.0000 mg | ORAL_CAPSULE | Freq: Two times a day (BID) | ORAL | 0 refills | Status: AC

## 2022-10-15 NOTE — Addendum Note (Signed)
Addended by: Pilar Grammes on: 10/15/2022 12:47 PM   Modules accepted: Orders

## 2022-10-15 NOTE — Telephone Encounter (Signed)
Spoke to husband. Hand is still swollen and she finished the antibiotic tomorrow morning. Yes, home visit either day is great. I have canceled her 10-23-22 appt. Please advise about hand.

## 2022-10-15 NOTE — Telephone Encounter (Signed)
Left message on VM per DPR. Sent Rx to [facility].

## 2022-10-16 DIAGNOSIS — I872 Venous insufficiency (chronic) (peripheral): Secondary | ICD-10-CM | POA: Diagnosis not present

## 2022-10-16 DIAGNOSIS — E1159 Type 2 diabetes mellitus with other circulatory complications: Secondary | ICD-10-CM | POA: Diagnosis not present

## 2022-10-16 DIAGNOSIS — M150 Primary generalized (osteo)arthritis: Secondary | ICD-10-CM | POA: Diagnosis not present

## 2022-10-16 DIAGNOSIS — L03113 Cellulitis of right upper limb: Secondary | ICD-10-CM | POA: Diagnosis not present

## 2022-10-16 DIAGNOSIS — L97818 Non-pressure chronic ulcer of other part of right lower leg with other specified severity: Secondary | ICD-10-CM | POA: Diagnosis not present

## 2022-10-16 DIAGNOSIS — L97218 Non-pressure chronic ulcer of right calf with other specified severity: Secondary | ICD-10-CM | POA: Diagnosis not present

## 2022-10-20 ENCOUNTER — Ambulatory Visit: Payer: Medicare Other | Admitting: Internal Medicine

## 2022-10-20 ENCOUNTER — Telehealth: Payer: Self-pay | Admitting: Internal Medicine

## 2022-10-20 NOTE — Telephone Encounter (Signed)
Patient husband Herbie [place] called in and stated that he spoke with you in regards to a home visit. He called in to follow up on this request. Please advise. Thank you!

## 2022-10-20 NOTE — Telephone Encounter (Signed)
I just spoke to him---I am going out on Wednesday 11/1

## 2022-10-22 ENCOUNTER — Ambulatory Visit: Payer: Medicare Other | Admitting: Internal Medicine

## 2022-10-22 ENCOUNTER — Encounter: Payer: Self-pay | Admitting: Internal Medicine

## 2022-10-22 VITALS — BP 118/72 | HR 96 | Resp 24 | Wt 192.0 lb

## 2022-10-22 DIAGNOSIS — L97211 Non-pressure chronic ulcer of right calf limited to breakdown of skin: Secondary | ICD-10-CM | POA: Diagnosis not present

## 2022-10-22 DIAGNOSIS — E1140 Type 2 diabetes mellitus with diabetic neuropathy, unspecified: Secondary | ICD-10-CM

## 2022-10-22 DIAGNOSIS — I272 Pulmonary hypertension, unspecified: Secondary | ICD-10-CM | POA: Diagnosis not present

## 2022-10-22 DIAGNOSIS — L03113 Cellulitis of right upper limb: Secondary | ICD-10-CM | POA: Diagnosis not present

## 2022-10-22 DIAGNOSIS — G250 Essential tremor: Secondary | ICD-10-CM

## 2022-10-22 DIAGNOSIS — F39 Unspecified mood [affective] disorder: Secondary | ICD-10-CM | POA: Diagnosis not present

## 2022-10-22 DIAGNOSIS — J9611 Chronic respiratory failure with hypoxia: Secondary | ICD-10-CM | POA: Diagnosis not present

## 2022-10-22 DIAGNOSIS — I83012 Varicose veins of right lower extremity with ulcer of calf: Secondary | ICD-10-CM

## 2022-10-22 MED ORDER — TORSEMIDE 20 MG PO TABS: 40.0000 mg | ORAL_TABLET | Freq: Two times a day (BID) | ORAL | 5 refills | Status: DC

## 2022-10-22 MED ORDER — ONETOUCH VERIO VI STRP: ORAL_STRIP | 3 refills | Status: DC

## 2022-10-22 NOTE — Assessment & Plan Note (Signed)
Pain is better on duloxetine 60---and this has helped her mood as well

## 2022-10-22 NOTE — Progress Notes (Signed)
Subjective:    Patient ID: Kari Medina, female    DOB: [birth year], 76 y.o.   MRN: [id number]  HPI Hospital follow up and review of chronic health conditions First home visit---basically home bound (very hard for husband to get her out now) Husband is here  Had swelling in the right arm Worsened and was painful Had to go to ER and was diagnosed with cellulitis Admitted and IV antibiotics Bad arthritis also seen on CT scan Treated with ceftriaxone----and sent home on oral antibiotics. Done as of yesterday (cefdinir 300 bid) Still having closing hand and is right handed  Wondered whether some of her right arm problems went back to shoulder replacement some time ago. Needs hand to push up if she gets out of chair Tremors really bad--since off propranolol Husband has to help her eating at times---due to weakness and tremor  Able to get out of lift chair without help Walks with rollator--but mostly uses transport chair (like to bathroom, etc) Husband helps with dressing and bathroom.  Wears pad and underwear----incontinent of urine especially after diuretics. Is continent of bowels Transfer chair to get into tub---husband has had to help with this since the hand was bad. He does help with showers  Cymbalta has helped overall pain levels Uses tylenol as well---prn Mood has been okay--just some irritation with limitations  Breathing is still tough DOE even if oxygen off very briefly Leg swelling is much better Husband changes dressings daily Just one ulcer on right calf remains  Current Outpatient Medications on File Prior to Visit  Medication Sig Dispense Refill   acetaminophen (TYLENOL) 650 MG CR tablet Take 650 mg by mouth every 8 (eight) hours as needed for pain.     ALPRAZolam (XANAX) 0.25 MG tablet Take 1 tablet (0.25 mg total) by mouth 2 (two) times daily as needed for anxiety. 30 tablet 0   aspirin EC 81 MG tablet Take 81 mg by mouth at bedtime.      budesonide-formoterol (SYMBICORT) 160-4.5 MCG/ACT inhaler Inhale 2 puffs into the lungs 2 (two) times daily. 3 each 1   DULoxetine (CYMBALTA) 60 MG capsule TAKE 1 CAPSULE BY MOUTH  DAILY 90 capsule 3   glucose blood (ONETOUCH VERIO) test strip Use to check blood sugar once a day. E11.9 100 each 3   HYDROcodone-acetaminophen (NORCO/VICODIN) 5-325 MG tablet Take 0.5-1 tablets by mouth 3 (three) times daily as needed for moderate pain. 20 tablet 0   levothyroxine (SYNTHROID) 200 MCG tablet TAKE 1 TABLET BY MOUTH DAILY  BEFORE BREAKFAST 90 tablet 3   metFORMIN (GLUCOPHAGE-XR) 500 MG 24 hr tablet TAKE 1 TABLET BY MOUTH  DAILY WITH BREAKFAST 90 tablet 3   montelukast (SINGULAIR) 10 MG tablet TAKE 1 TABLET BY MOUTH AT  BEDTIME 90 tablet 3   ONETOUCH DELICA LANCETS FINE MISC 1 each by Does not apply route daily. Use to check blood sugar once a day. E11.9 100 each 3   rOPINIRole (REQUIP) 3 MG tablet Take 1 tablet (3 mg total) by mouth at bedtime. 90 tablet 3   spironolactone (ALDACTONE) 25 MG tablet Take 1 tablet (25 mg total) by mouth daily. 90 tablet 1   No current facility-administered medications on file prior to visit.    Allergies  Allergen Reactions   Latex Hives    Tape and bandaids only   Nickel Dermatitis   Pramipexole Other (See Comments)    Caused hallucinations, says she can take name brand.   Prednisone  Makes her "feel crazy" - can take low dosage    Topiramate Other (See Comments)    "spaced out"   Citalopram Anxiety   Paroxetine Hcl Anxiety   Sertraline Anxiety    Past Medical History:  Diagnosis Date   Asthma    Chronic hypoxemic respiratory failure (HCC)    uses O2 with exertion and with CPAP   COPD (chronic obstructive pulmonary disease) ([place])    Depression    Diabetes mellitus without complication (HCC)    Dyspnea    doe   Dysrhythmia    extra beat   Fatty liver    Fibromyalgia    Generalized osteoarthritis of multiple sites    History of hiatal hernia     Hypertension    Hypothyroidism    Melanoma ([place]) 07/2018   Right leg   OSA (obstructive sleep apnea)    Pain    chronic ruq and back pain   Panic attacks    PONV (postoperative nausea and vomiting)    after thyroidectomy   Raynaud disease    RLS (restless legs syndrome)    Spleen absent    TOLD ABSENT THEN TOLD DOES HAVE SPLEEN. PATIENT IS UNCERTAIN   Tremor, essential    Wears dentures    full upper and lower    Past Surgical History:  Procedure Laterality Date   CATARACT EXTRACTION W/PHACO Right 03/04/2021   Procedure: CATARACT EXTRACTION PHACO AND INTRAOCULAR LENS PLACEMENT (IOC) RIGHT DIABETIC 3.98 00:33.2;  Surgeon: Eulogio Bear, MD;  Location: [facility];  Service: Ophthalmology;  Laterality: Right;  Diabetic - oral meds   CATARACT EXTRACTION W/PHACO Left 03/18/2021   Procedure: CATARACT EXTRACTION PHACO AND INTRAOCULAR LENS PLACEMENT ([place]) LEFT;  Surgeon: Eulogio Bear, MD;  Location: [facility];  Service: Ophthalmology;  Laterality: Left;  2.96 0:27.9   CHOLECYSTECTOMY     DILATATION & CURETTAGE/HYSTEROSCOPY WITH MYOSURE N/A 11/10/2018   Procedure: DILATATION & CURETTAGE/HYSTEROSCOPY WITH MYOSURE/MYOMECTOMY;  Surgeon: Aletha Halim, MD;  Location: [facility];  Service: Gynecology;  Laterality: N/A;  possible myosure.  Please use myosure scope, do not open myosure blades but have in the room   JOINT REPLACEMENT Bilateral    2008/2011   MELANOMA EXCISION  07/2018   REVERSE SHOULDER ARTHROPLASTY Right 05/28/2017   Procedure: REVERSE SHOULDER ARTHROPLASTY;  Surgeon: Corky Mull, MD;  Location: [facility];  Service: Orthopedics;  Laterality: Right;   RHINOPLASTY  1972   RIGHT HEART CATH N/A 12/27/2021   Procedure: RIGHT HEART CATH;  Surgeon: Andrez Grime, MD;  Location: [facility];  Service: Cardiovascular;  Laterality: N/A;   THYROIDECTOMY  2006   TOTAL KNEE ARTHROPLASTY     bilateral    Family History  Problem  Relation Age of Onset   Osteoarthritis Mother    Diabetes Mother    Cirrhosis Mother    Cancer Father    Kidney cancer Father    Bladder Cancer Father    Heart disease Brother        stents in 1 brother   Breast cancer Neg Hx     Social History   Socioeconomic History   Marital status: Married    Spouse name: Not on file   Number of children: 1   Years of education: Not on file   Highest education level: Not on file  Occupational History   Occupation: Neurosurgeon    Comment: Retired  Tobacco Use   Smoking status: Former  Packs/day: 0.25    Years: 5.00    Total pack years: 1.25    Types: Cigarettes    Quit date: 12/22/1988    Years since quitting: 33.8   Smokeless tobacco: Never  Vaping Use   Vaping Use: Never used  Substance and Sexual Activity   Alcohol use: No   Drug use: No   Sexual activity: Not Currently  Other Topics Concern   Not on file  Social History Narrative   1 daughter      Has living will   Husband has health care POA. Alternate would be daughter Judson Roch   Would allow resuscitation but no prolonged machines   Not sure about feeding tubes   Social Determinants of Health   Financial Resource Strain: Not on file  Food Insecurity: No Food Insecurity (10/09/2022)   Hunger Vital Sign    Worried About Running Out of Food in the Last Year: Never true    Ran Out of Food in the Last Year: Never true  Transportation Needs: No Transportation Needs (10/09/2022)   PRAPARE - Hydrologist (Medical): No    Lack of Transportation (Non-Medical): No  Physical Activity: Not on file  Stress: Not on file  Social Connections: Not on file  Intimate Partner Violence: Not At Risk (10/09/2022)   Humiliation, Afraid, Rape, and Kick questionnaire    Fear of Current or Ex-Partner: No    Emotionally Abused: No    Physically Abused: No    Sexually Abused: No   Review of Systems Appetite is not great Stays thirsty and likes  ice Sleeping in recliner--can't sleep in bed. Sleeps okay    Objective:   Physical Exam Constitutional:      Appearance: Normal appearance.  Cardiovascular:     Rate and Rhythm: Normal rate and regular rhythm.  Pulmonary:     Effort: Pulmonary effort is normal.     Breath sounds: No wheezing, rhonchi or rales.     Comments: Slightly decreased breath sounds but clear Abdominal:     Palpations: Abdomen is soft.     Tenderness: There is no abdominal tenderness.  Musculoskeletal:     Cervical back: Neck supple.     Comments: Feet puffy but no pitting Right hand and wrist are puffy but not inflamed or red. Limited grip in right hand  Lymphadenopathy:     Cervical: No cervical adenopathy.  Skin:    Comments: 2 small ulcers on lower right calf. They both have some degree of epithelialization but not normal. No inflammation  Neurological:     Mental Status: She is alert.  Psychiatric:        Mood and Affect: Mood normal.        Behavior: Behavior normal.            Assessment & Plan:

## 2022-10-22 NOTE — Assessment & Plan Note (Signed)
Sugar 119 today Generally controlled with metformin Lab Results  Component Value Date   HGBA1C 6.7 (H) 06/29/2022

## 2022-10-22 NOTE — Assessment & Plan Note (Signed)
Using the oxygen and CPAP at night

## 2022-10-22 NOTE — Assessment & Plan Note (Signed)
Breathing is stable but with severe restrictions in activity Oxygen 24/7----dyspnea when off just briefly Edema is better on the torsemide 40 bid and spironolactone 25 Can hold evening torsemide dose if weight stays down

## 2022-10-22 NOTE — Assessment & Plan Note (Signed)
Right hand and wrist Redness/pain better Still some swelling and reduction in function Just finished the cefdinir

## 2022-10-22 NOTE — Assessment & Plan Note (Signed)
More symptomatic off the propranolol

## 2022-10-22 NOTE — Assessment & Plan Note (Signed)
Now epithelialized  Husband will monitor

## 2022-10-23 ENCOUNTER — Ambulatory Visit: Payer: Medicare Other | Admitting: Internal Medicine

## 2022-10-24 DIAGNOSIS — E1159 Type 2 diabetes mellitus with other circulatory complications: Secondary | ICD-10-CM | POA: Diagnosis not present

## 2022-10-24 DIAGNOSIS — L97818 Non-pressure chronic ulcer of other part of right lower leg with other specified severity: Secondary | ICD-10-CM | POA: Diagnosis not present

## 2022-10-24 DIAGNOSIS — M150 Primary generalized (osteo)arthritis: Secondary | ICD-10-CM | POA: Diagnosis not present

## 2022-10-24 DIAGNOSIS — L03113 Cellulitis of right upper limb: Secondary | ICD-10-CM | POA: Diagnosis not present

## 2022-10-24 DIAGNOSIS — I872 Venous insufficiency (chronic) (peripheral): Secondary | ICD-10-CM | POA: Diagnosis not present

## 2022-10-24 DIAGNOSIS — L97218 Non-pressure chronic ulcer of right calf with other specified severity: Secondary | ICD-10-CM | POA: Diagnosis not present

## 2022-10-29 ENCOUNTER — Telehealth: Payer: Self-pay | Admitting: Internal Medicine

## 2022-10-29 NOTE — Telephone Encounter (Signed)
Pt called stating she doesn't feel better since hosp discharge. Pt stated her fingers won't close to make a fist, she still has pain in ankle, feet & right arm. Pt is asking can something be sent in that's stronger? Pt states she was taking HYDROcodone-acetaminophen (NORCO/VICODIN) 5-325 MG tablet & Cefdinir 300 mg (taken once every 12 hrs) but they're not helping. Call back # [phone]

## 2022-10-30 MED ORDER — HYDROCODONE-ACETAMINOPHEN 5-325 MG PO TABS: 0.5000 | ORAL_TABLET | Freq: Three times a day (TID) | ORAL | 0 refills | Status: DC | PRN

## 2022-10-30 NOTE — Telephone Encounter (Signed)
Spoke to pt's husband per DPR. He is asking if she can get a refill of hydrocodone or something else to help with pain that is waking her up at night. Said she had been taking 1 hydrocodone at night to help with the pain and it allowed her to sleep better. She is out now. Did not sleep well last night.

## 2022-10-30 NOTE — Telephone Encounter (Signed)
Spoke to pt's husband. He will get back with [place] if the hand does not get any better.

## 2022-10-30 NOTE — Telephone Encounter (Signed)
Okay--that makes sense. Let him know that I sent the refill

## 2022-11-05 DIAGNOSIS — M150 Primary generalized (osteo)arthritis: Secondary | ICD-10-CM | POA: Diagnosis not present

## 2022-11-05 DIAGNOSIS — G8929 Other chronic pain: Secondary | ICD-10-CM | POA: Diagnosis not present

## 2022-11-05 DIAGNOSIS — G250 Essential tremor: Secondary | ICD-10-CM

## 2022-11-05 DIAGNOSIS — M797 Fibromyalgia: Secondary | ICD-10-CM | POA: Diagnosis not present

## 2022-11-05 DIAGNOSIS — I272 Pulmonary hypertension, unspecified: Secondary | ICD-10-CM

## 2022-11-05 DIAGNOSIS — J449 Chronic obstructive pulmonary disease, unspecified: Secondary | ICD-10-CM

## 2022-11-05 DIAGNOSIS — E063 Autoimmune thyroiditis: Secondary | ICD-10-CM

## 2022-11-05 DIAGNOSIS — F410 Panic disorder [episodic paroxysmal anxiety] without agoraphobia: Secondary | ICD-10-CM

## 2022-11-05 DIAGNOSIS — G2581 Restless legs syndrome: Secondary | ICD-10-CM

## 2022-11-05 DIAGNOSIS — N1831 Chronic kidney disease, stage 3a: Secondary | ICD-10-CM

## 2022-11-05 DIAGNOSIS — I872 Venous insufficiency (chronic) (peripheral): Secondary | ICD-10-CM | POA: Diagnosis not present

## 2022-11-05 DIAGNOSIS — I7300 Raynaud's syndrome without gangrene: Secondary | ICD-10-CM | POA: Diagnosis not present

## 2022-11-05 DIAGNOSIS — J9611 Chronic respiratory failure with hypoxia: Secondary | ICD-10-CM | POA: Diagnosis not present

## 2022-11-05 DIAGNOSIS — G4733 Obstructive sleep apnea (adult) (pediatric): Secondary | ICD-10-CM

## 2022-11-05 DIAGNOSIS — L97818 Non-pressure chronic ulcer of other part of right lower leg with other specified severity: Secondary | ICD-10-CM | POA: Diagnosis not present

## 2022-11-05 DIAGNOSIS — E1159 Type 2 diabetes mellitus with other circulatory complications: Secondary | ICD-10-CM | POA: Diagnosis not present

## 2022-11-05 DIAGNOSIS — L97218 Non-pressure chronic ulcer of right calf with other specified severity: Secondary | ICD-10-CM | POA: Diagnosis not present

## 2022-11-05 DIAGNOSIS — Z87891 Personal history of nicotine dependence: Secondary | ICD-10-CM

## 2022-11-05 DIAGNOSIS — E1151 Type 2 diabetes mellitus with diabetic peripheral angiopathy without gangrene: Secondary | ICD-10-CM | POA: Diagnosis not present

## 2022-11-05 DIAGNOSIS — M25841 Other specified joint disorders, right hand: Secondary | ICD-10-CM | POA: Diagnosis not present

## 2022-11-05 DIAGNOSIS — L03113 Cellulitis of right upper limb: Secondary | ICD-10-CM | POA: Diagnosis not present

## 2022-11-05 DIAGNOSIS — F32A Depression, unspecified: Secondary | ICD-10-CM

## 2022-11-05 DIAGNOSIS — I129 Hypertensive chronic kidney disease with stage 1 through stage 4 chronic kidney disease, or unspecified chronic kidney disease: Secondary | ICD-10-CM

## 2022-11-05 DIAGNOSIS — E039 Hypothyroidism, unspecified: Secondary | ICD-10-CM

## 2022-11-13 DIAGNOSIS — F410 Panic disorder [episodic paroxysmal anxiety] without agoraphobia: Secondary | ICD-10-CM | POA: Diagnosis not present

## 2022-11-13 DIAGNOSIS — F32A Depression, unspecified: Secondary | ICD-10-CM | POA: Diagnosis not present

## 2022-11-13 DIAGNOSIS — I129 Hypertensive chronic kidney disease with stage 1 through stage 4 chronic kidney disease, or unspecified chronic kidney disease: Secondary | ICD-10-CM | POA: Diagnosis not present

## 2022-11-13 DIAGNOSIS — J9611 Chronic respiratory failure with hypoxia: Secondary | ICD-10-CM | POA: Diagnosis not present

## 2022-11-13 DIAGNOSIS — L03113 Cellulitis of right upper limb: Secondary | ICD-10-CM | POA: Diagnosis not present

## 2022-11-13 DIAGNOSIS — N1831 Chronic kidney disease, stage 3a: Secondary | ICD-10-CM | POA: Diagnosis not present

## 2022-11-13 DIAGNOSIS — E039 Hypothyroidism, unspecified: Secondary | ICD-10-CM | POA: Diagnosis not present

## 2022-11-13 DIAGNOSIS — M797 Fibromyalgia: Secondary | ICD-10-CM | POA: Diagnosis not present

## 2022-11-13 DIAGNOSIS — E1159 Type 2 diabetes mellitus with other circulatory complications: Secondary | ICD-10-CM | POA: Diagnosis not present

## 2022-11-13 DIAGNOSIS — L97218 Non-pressure chronic ulcer of right calf with other specified severity: Secondary | ICD-10-CM | POA: Diagnosis not present

## 2022-11-13 DIAGNOSIS — E1151 Type 2 diabetes mellitus with diabetic peripheral angiopathy without gangrene: Secondary | ICD-10-CM | POA: Diagnosis not present

## 2022-11-13 DIAGNOSIS — I872 Venous insufficiency (chronic) (peripheral): Secondary | ICD-10-CM | POA: Diagnosis not present

## 2022-11-13 DIAGNOSIS — E063 Autoimmune thyroiditis: Secondary | ICD-10-CM | POA: Diagnosis not present

## 2022-11-13 DIAGNOSIS — I272 Pulmonary hypertension, unspecified: Secondary | ICD-10-CM | POA: Diagnosis not present

## 2022-11-13 DIAGNOSIS — G8929 Other chronic pain: Secondary | ICD-10-CM | POA: Diagnosis not present

## 2022-11-13 DIAGNOSIS — G4733 Obstructive sleep apnea (adult) (pediatric): Secondary | ICD-10-CM | POA: Diagnosis not present

## 2022-11-13 DIAGNOSIS — L97818 Non-pressure chronic ulcer of other part of right lower leg with other specified severity: Secondary | ICD-10-CM | POA: Diagnosis not present

## 2022-11-13 DIAGNOSIS — M150 Primary generalized (osteo)arthritis: Secondary | ICD-10-CM | POA: Diagnosis not present

## 2022-11-13 DIAGNOSIS — J449 Chronic obstructive pulmonary disease, unspecified: Secondary | ICD-10-CM | POA: Diagnosis not present

## 2022-11-13 DIAGNOSIS — Z87891 Personal history of nicotine dependence: Secondary | ICD-10-CM | POA: Diagnosis not present

## 2022-11-13 DIAGNOSIS — M25841 Other specified joint disorders, right hand: Secondary | ICD-10-CM | POA: Diagnosis not present

## 2022-11-13 DIAGNOSIS — G250 Essential tremor: Secondary | ICD-10-CM | POA: Diagnosis not present

## 2022-11-13 DIAGNOSIS — G2581 Restless legs syndrome: Secondary | ICD-10-CM | POA: Diagnosis not present

## 2022-11-13 DIAGNOSIS — I7300 Raynaud's syndrome without gangrene: Secondary | ICD-10-CM | POA: Diagnosis not present

## 2022-11-19 ENCOUNTER — Telehealth: Payer: Self-pay | Admitting: Internal Medicine

## 2022-11-19 NOTE — Telephone Encounter (Signed)
Daughter called in stating that mom is experiencing a lot of pain in her feet,ankle,hands and fingers, she thinks that it has to do with her arthritis but she's not really sure. She has a little swelling in her feet,but not a lot . She would like to know is there possibly something that can be called in to help with the pain and arthritis? She was just seen at a home visit on 10/22/2022.

## 2022-11-20 MED ORDER — ALPRAZOLAM 0.25 MG PO TABS: 0.2500 mg | ORAL_TABLET | Freq: Two times a day (BID) | ORAL | 0 refills | Status: DC | PRN

## 2022-11-20 MED ORDER — HYDROCODONE-ACETAMINOPHEN 5-325 MG PO TABS: 0.5000 | ORAL_TABLET | Freq: Three times a day (TID) | ORAL | 0 refills | Status: DC | PRN

## 2022-11-20 NOTE — Telephone Encounter (Signed)
Spoke to pt. She will try breaking them in half.

## 2022-11-20 NOTE — Addendum Note (Signed)
Addended by: [name] I on: 11/20/2022 04:41 PM   Modules accepted: Orders

## 2022-11-20 NOTE — Telephone Encounter (Signed)
Please let them know that I sent a refill of the hydrocodone. If that didn't help, we will have to consider alternatives (I am assuming it helped but she ran out)

## 2022-11-20 NOTE — Telephone Encounter (Signed)
Ms Neiswonger would like to know is there possibly a less stronger rx that can be sent in for her besides the hydrocodone? She said even though this medication helps with her pain ,it causes her to hallucinate. She would also like to know if rx ALPRAZolam (XANAX) 0.25 MG tablet can be called in for her as well,it helps with her anxiety? She also asked is there anything else she can use besides arthritis cream for the arthritis that she has in her hands ?

## 2022-11-20 NOTE — Telephone Encounter (Signed)
Let her know that I refilled the xanax.  Have her try cutting the hydrocodone in half to see if that helps without the hallucinations

## 2022-11-26 ENCOUNTER — Telehealth: Payer: Self-pay | Admitting: Internal Medicine

## 2022-11-26 NOTE — Telephone Encounter (Signed)
Spoke to pt. She will contact the specialist.

## 2022-11-26 NOTE — Telephone Encounter (Signed)
Patient called and stated she is seeing a chiropractor today for her neck, and she stated can she get some medication for her to calm down. Call back number [phone]

## 2022-11-26 NOTE — Telephone Encounter (Signed)
Called to ask what she was wanting. She said her neck is frozen. Can't turn her head. Says the alprazolam 0.25 mg is not helping her anxiety and this condition is making her anxiety worse. Trying muscle rub and pain patches.

## 2022-11-26 NOTE — Telephone Encounter (Signed)
Patient called back and stated she forgot to mention something important and wanted a call back.

## 2022-11-26 NOTE — Telephone Encounter (Signed)
She said the Dr at [facility] took her off of propranolol since July. Said her tremors are "driving me nuts".

## 2022-11-28 DIAGNOSIS — I872 Venous insufficiency (chronic) (peripheral): Secondary | ICD-10-CM | POA: Diagnosis not present

## 2022-11-28 DIAGNOSIS — M150 Primary generalized (osteo)arthritis: Secondary | ICD-10-CM | POA: Diagnosis not present

## 2022-11-28 DIAGNOSIS — L03113 Cellulitis of right upper limb: Secondary | ICD-10-CM | POA: Diagnosis not present

## 2022-11-28 DIAGNOSIS — L97818 Non-pressure chronic ulcer of other part of right lower leg with other specified severity: Secondary | ICD-10-CM | POA: Diagnosis not present

## 2022-11-28 DIAGNOSIS — E1159 Type 2 diabetes mellitus with other circulatory complications: Secondary | ICD-10-CM | POA: Diagnosis not present

## 2022-11-28 DIAGNOSIS — L97218 Non-pressure chronic ulcer of right calf with other specified severity: Secondary | ICD-10-CM | POA: Diagnosis not present

## 2022-12-03 ENCOUNTER — Encounter: Payer: Self-pay | Admitting: Internal Medicine

## 2022-12-03 ENCOUNTER — Ambulatory Visit: Payer: Medicare Other | Admitting: Internal Medicine

## 2022-12-03 VITALS — BP 122/62 | HR 90 | Resp 20

## 2022-12-03 DIAGNOSIS — G250 Essential tremor: Secondary | ICD-10-CM | POA: Diagnosis not present

## 2022-12-03 DIAGNOSIS — E1140 Type 2 diabetes mellitus with diabetic neuropathy, unspecified: Secondary | ICD-10-CM

## 2022-12-03 DIAGNOSIS — L03113 Cellulitis of right upper limb: Secondary | ICD-10-CM | POA: Diagnosis not present

## 2022-12-03 DIAGNOSIS — I272 Pulmonary hypertension, unspecified: Secondary | ICD-10-CM

## 2022-12-03 DIAGNOSIS — E1159 Type 2 diabetes mellitus with other circulatory complications: Secondary | ICD-10-CM | POA: Diagnosis not present

## 2022-12-03 DIAGNOSIS — G2581 Restless legs syndrome: Secondary | ICD-10-CM | POA: Diagnosis not present

## 2022-12-03 DIAGNOSIS — I872 Venous insufficiency (chronic) (peripheral): Secondary | ICD-10-CM | POA: Diagnosis not present

## 2022-12-03 DIAGNOSIS — J9611 Chronic respiratory failure with hypoxia: Secondary | ICD-10-CM

## 2022-12-03 DIAGNOSIS — L97818 Non-pressure chronic ulcer of other part of right lower leg with other specified severity: Secondary | ICD-10-CM | POA: Diagnosis not present

## 2022-12-03 DIAGNOSIS — F39 Unspecified mood [affective] disorder: Secondary | ICD-10-CM | POA: Diagnosis not present

## 2022-12-03 DIAGNOSIS — M150 Primary generalized (osteo)arthritis: Secondary | ICD-10-CM | POA: Diagnosis not present

## 2022-12-03 DIAGNOSIS — L97218 Non-pressure chronic ulcer of right calf with other specified severity: Secondary | ICD-10-CM | POA: Diagnosis not present

## 2022-12-03 MED ORDER — TORSEMIDE 20 MG PO TABS: 20.0000 mg | ORAL_TABLET | Freq: Two times a day (BID) | ORAL | 5 refills | Status: DC

## 2022-12-03 NOTE — Assessment & Plan Note (Signed)
Generally does okay with the oxygen 24/7

## 2022-12-03 NOTE — Assessment & Plan Note (Addendum)
Still seems to have good control on the metformin 500 Hopefully can get labs next time she sees another doctor Duloxetine '60mg'$  for the neuropathy

## 2022-12-03 NOTE — Progress Notes (Signed)
Subjective:    Patient ID: Kari Medina, female    DOB: 1946-12-08, 76 y.o.   MRN: [id number]  HPI Home visit for follow up of her multiple disabling medical conditions Husband is here as usual  Doing okay Still has significant pain--but not as bad The hydrocodone had been too much for her---she has tried 1/2 and that is better She still needs the full one at times--feet especially have been bad at times  Still mostly moves in transport chair Walks with the rollator occasionally--and rolls in it also Biggest problem is neuropathy pain Tends to get cold at times Does feel stronger now  Husband helps her with dressing--but she is able to do more He still helps her get in and out of shower--has chair and transfer board Uses bathroom herself--uses the commode over it to make it higher. Needs help with cleaning.  Still incontinent of urine a lot of the time---especially after diuretic  Still on the oxygen all the time Breathing is still short with activity No problems when sitting  Checks sugars occasionally Usually 100-120 fasting  Checked labs GFR 50 Last A1c 6.7%  Leg swelling is better Still has some bruising Ulcers are healed  Was given permission to go back on propranolol Helping some for her tremor  Current Outpatient Medications on File Prior to Visit  Medication Sig Dispense Refill   acetaminophen (TYLENOL) 650 MG CR tablet Take 650 mg by mouth every 8 (eight) hours as needed for pain.     ALPRAZolam (XANAX) 0.25 MG tablet Take 1 tablet (0.25 mg total) by mouth 2 (two) times daily as needed for anxiety. 30 tablet 0   aspirin EC 81 MG tablet Take 81 mg by mouth at bedtime.     budesonide-formoterol (SYMBICORT) 160-4.5 MCG/ACT inhaler Inhale 2 puffs into the lungs 2 (two) times daily. 3 each 1   DULoxetine (CYMBALTA) 60 MG capsule TAKE 1 CAPSULE BY MOUTH  DAILY 90 capsule 3   glucose blood (ONETOUCH VERIO) test strip Use to check blood sugar once a day.  E11.9 100 each 3   HYDROcodone-acetaminophen (NORCO/VICODIN) 5-325 MG tablet Take 0.5-1 tablets by mouth 3 (three) times daily as needed for moderate pain. 30 tablet 0   levothyroxine (SYNTHROID) 200 MCG tablet TAKE 1 TABLET BY MOUTH DAILY  BEFORE BREAKFAST 90 tablet 3   metFORMIN (GLUCOPHAGE-XR) 500 MG 24 hr tablet TAKE 1 TABLET BY MOUTH  DAILY WITH BREAKFAST 90 tablet 3   montelukast (SINGULAIR) 10 MG tablet TAKE 1 TABLET BY MOUTH AT  BEDTIME 90 tablet 3   ONETOUCH DELICA LANCETS FINE MISC 1 each by Does not apply route daily. Use to check blood sugar once a day. E11.9 100 each 3   rOPINIRole (REQUIP) 3 MG tablet Take 1 tablet (3 mg total) by mouth at bedtime. 90 tablet 3   spironolactone (ALDACTONE) 25 MG tablet Take 1 tablet (25 mg total) by mouth daily. 90 tablet 1   No current facility-administered medications on file prior to visit.    Allergies  Allergen Reactions   Latex Hives    Tape and bandaids only   Nickel Dermatitis   Pramipexole Other (See Comments)    Caused hallucinations, says she can take name brand.   Prednisone     Makes her "feel crazy" - can take low dosage    Topiramate Other (See Comments)    "spaced out"   Citalopram Anxiety   Paroxetine Hcl Anxiety   Sertraline Anxiety  Past Medical History:  Diagnosis Date   Asthma    Chronic hypoxemic respiratory failure (HCC)    uses O2 with exertion and with CPAP   COPD (chronic obstructive pulmonary disease) (HCC)    Depression    Diabetes mellitus without complication (HCC)    Dyspnea    doe   Dysrhythmia    extra beat   Fatty liver    Fibromyalgia    Generalized osteoarthritis of multiple sites    History of hiatal hernia    Hypertension    Hypothyroidism    Melanoma ([place]) 07/2018   Right leg   OSA (obstructive sleep apnea)    Pain    chronic ruq and back pain   Panic attacks    PONV (postoperative nausea and vomiting)    after thyroidectomy   Raynaud disease    RLS (restless legs syndrome)     Spleen absent    TOLD ABSENT THEN TOLD DOES HAVE SPLEEN. PATIENT IS UNCERTAIN   Tremor, essential    Wears dentures    full upper and lower    Past Surgical History:  Procedure Laterality Date   CATARACT EXTRACTION W/PHACO Right 03/04/2021   Procedure: CATARACT EXTRACTION PHACO AND INTRAOCULAR LENS PLACEMENT (IOC) RIGHT DIABETIC 3.98 00:33.2;  Surgeon: Eulogio Bear, MD;  Location: [facility];  Service: Ophthalmology;  Laterality: Right;  Diabetic - oral meds   CATARACT EXTRACTION W/PHACO Left 03/18/2021   Procedure: CATARACT EXTRACTION PHACO AND INTRAOCULAR LENS PLACEMENT ([place]) LEFT;  Surgeon: Eulogio Bear, MD;  Location: [facility];  Service: Ophthalmology;  Laterality: Left;  2.96 0:27.9   CHOLECYSTECTOMY     DILATATION & CURETTAGE/HYSTEROSCOPY WITH MYOSURE N/A 11/10/2018   Procedure: DILATATION & CURETTAGE/HYSTEROSCOPY WITH MYOSURE/MYOMECTOMY;  Surgeon: Aletha Halim, MD;  Location: [facility];  Service: Gynecology;  Laterality: N/A;  possible myosure.  Please use myosure scope, do not open myosure blades but have in the room   JOINT REPLACEMENT Bilateral    2008/2011   MELANOMA EXCISION  07/2018   REVERSE SHOULDER ARTHROPLASTY Right 05/28/2017   Procedure: REVERSE SHOULDER ARTHROPLASTY;  Surgeon: Corky Mull, MD;  Location: [facility];  Service: Orthopedics;  Laterality: Right;   RHINOPLASTY  1972   RIGHT HEART CATH N/A 12/27/2021   Procedure: RIGHT HEART CATH;  Surgeon: Andrez Grime, MD;  Location: [facility];  Service: Cardiovascular;  Laterality: N/A;   THYROIDECTOMY  2006   TOTAL KNEE ARTHROPLASTY     bilateral    Family History  Problem Relation Age of Onset   Osteoarthritis Mother    Diabetes Mother    Cirrhosis Mother    Cancer Father    Kidney cancer Father    Bladder Cancer Father    Heart disease Brother        stents in 1 brother   Breast cancer Neg Hx     Social History   Socioeconomic History    Marital status: Married    Spouse name: Not on file   Number of children: 1   Years of education: Not on file   Highest education level: Not on file  Occupational History   Occupation: Neurosurgeon    Comment: Retired  Tobacco Use   Smoking status: Former    Packs/day: 0.25    Years: 5.00    Total pack years: 1.25    Types: Cigarettes    Quit date: 12/22/1988    Years since quitting: 63.9  Smokeless tobacco: Never  Vaping Use   Vaping Use: Never used  Substance and Sexual Activity   Alcohol use: No   Drug use: No   Sexual activity: Not Currently  Other Topics Concern   Not on file  Social History Narrative   1 daughter      Has living will   Husband has health care POA. Alternate would be daughter Judson Roch   Would allow resuscitation but no prolonged machines   Not sure about feeding tubes   Social Determinants of Health   Financial Resource Strain: Not on file  Food Insecurity: No Food Insecurity (10/09/2022)   Hunger Vital Sign    Worried About Running Out of Food in the Last Year: Never true    Ran Out of Food in the Last Year: Never true  Transportation Needs: No Transportation Needs (10/09/2022)   PRAPARE - Hydrologist (Medical): No    Lack of Transportation (Non-Medical): No  Physical Activity: Not on file  Stress: Not on file  Social Connections: Not on file  Intimate Partner Violence: Not At Risk (10/09/2022)   Humiliation, Afraid, Rape, and Kick questionnaire    Fear of Current or Ex-Partner: No    Emotionally Abused: No    Physically Abused: No    Sexually Abused: No   Review of Systems Sleeps okay with the CPAP Appetite is poor but she drinks well Drinking boost daily---she feels this has helped strength Weight is stable Did see chiropractor---woke with stiff neck and pain and it stayed for 3 days. 2 visits with Dr Marlowe Sax and is doing better     Objective:   Physical Exam Constitutional:      Appearance:  Normal appearance.  Cardiovascular:     Rate and Rhythm: Normal rate and regular rhythm.     Heart sounds: No murmur heard.    No gallop.     Comments: Occ skips Pulmonary:     Effort: Pulmonary effort is normal.     Breath sounds: Normal breath sounds. No wheezing or rales.  Abdominal:     Palpations: Abdomen is soft.     Tenderness: There is no abdominal tenderness.  Musculoskeletal:     Cervical back: Neck supple.     Comments: Trace calf edema  Lymphadenopathy:     Cervical: No cervical adenopathy.  Skin:    Comments: Ulcers all granulated now  Neurological:     Mental Status: She is alert.     Comments: Mild tremor            Assessment & Plan:

## 2022-12-03 NOTE — Assessment & Plan Note (Signed)
Some symptomatic improvement back on the low dose propranolol

## 2022-12-03 NOTE — Assessment & Plan Note (Signed)
Better control of fluid with torsemide 20 bid and aldactone 25 Stable DOE

## 2022-12-03 NOTE — Assessment & Plan Note (Signed)
Does okay with the ropinirole No apparent side effects with this

## 2022-12-03 NOTE — Assessment & Plan Note (Signed)
Still gets frustrated/depressed with limitations No MDD On the duloxetine

## 2022-12-05 DIAGNOSIS — L03113 Cellulitis of right upper limb: Secondary | ICD-10-CM | POA: Diagnosis not present

## 2022-12-05 DIAGNOSIS — L97818 Non-pressure chronic ulcer of other part of right lower leg with other specified severity: Secondary | ICD-10-CM | POA: Diagnosis not present

## 2022-12-05 DIAGNOSIS — I872 Venous insufficiency (chronic) (peripheral): Secondary | ICD-10-CM | POA: Diagnosis not present

## 2022-12-05 DIAGNOSIS — E1159 Type 2 diabetes mellitus with other circulatory complications: Secondary | ICD-10-CM | POA: Diagnosis not present

## 2022-12-05 DIAGNOSIS — M150 Primary generalized (osteo)arthritis: Secondary | ICD-10-CM | POA: Diagnosis not present

## 2022-12-05 DIAGNOSIS — L97218 Non-pressure chronic ulcer of right calf with other specified severity: Secondary | ICD-10-CM | POA: Diagnosis not present

## 2022-12-09 DIAGNOSIS — I2722 Pulmonary hypertension due to left heart disease: Secondary | ICD-10-CM | POA: Diagnosis not present

## 2022-12-09 DIAGNOSIS — R0602 Shortness of breath: Secondary | ICD-10-CM | POA: Diagnosis not present

## 2022-12-09 DIAGNOSIS — I272 Pulmonary hypertension, unspecified: Secondary | ICD-10-CM | POA: Diagnosis not present

## 2022-12-10 DIAGNOSIS — L97818 Non-pressure chronic ulcer of other part of right lower leg with other specified severity: Secondary | ICD-10-CM | POA: Diagnosis not present

## 2022-12-10 DIAGNOSIS — L97218 Non-pressure chronic ulcer of right calf with other specified severity: Secondary | ICD-10-CM | POA: Diagnosis not present

## 2022-12-10 DIAGNOSIS — M150 Primary generalized (osteo)arthritis: Secondary | ICD-10-CM | POA: Diagnosis not present

## 2022-12-10 DIAGNOSIS — I872 Venous insufficiency (chronic) (peripheral): Secondary | ICD-10-CM | POA: Diagnosis not present

## 2022-12-10 DIAGNOSIS — L03113 Cellulitis of right upper limb: Secondary | ICD-10-CM | POA: Diagnosis not present

## 2022-12-10 DIAGNOSIS — E1159 Type 2 diabetes mellitus with other circulatory complications: Secondary | ICD-10-CM | POA: Diagnosis not present

## 2022-12-31 ENCOUNTER — Other Ambulatory Visit: Payer: Self-pay | Admitting: Internal Medicine

## 2023-01-07 ENCOUNTER — Other Ambulatory Visit: Payer: Self-pay | Admitting: Internal Medicine

## 2023-01-07 MED ORDER — ALPRAZOLAM 0.25 MG PO TABS: 0.2500 mg | ORAL_TABLET | Freq: Two times a day (BID) | ORAL | 0 refills | Status: DC | PRN

## 2023-01-07 NOTE — Addendum Note (Signed)
Addended by: Pilar Grammes on: 01/07/2023 12:02 PM   Modules accepted: Orders

## 2023-01-07 NOTE — Telephone Encounter (Signed)
Last filled 11-20-22 #30

## 2023-01-07 NOTE — Telephone Encounter (Signed)
Prescription Request  01/07/2023  Is this a "Controlled Substance" medicine? No  LOV: 12/03/2022  What is the name of the medication or equipment? ALPRAZolam (XANAX) 0.25 MG tablet   Have you contacted your pharmacy to request a refill? No   Which pharmacy would you like this sent to?    [facility] [id number], [place] [address] Phone: [phone] Fax: [phone]    Patient notified that their request is being sent to the clinical staff for review and that they should receive a response within 2 business days.   Please advise at Home [phone]

## 2023-01-07 NOTE — Addendum Note (Signed)
Addended by: [name] I on: 01/07/2023 01:40 PM   Modules accepted: Orders

## 2023-01-09 ENCOUNTER — Telehealth: Payer: Self-pay | Admitting: Internal Medicine

## 2023-01-09 NOTE — Telephone Encounter (Signed)
Prescription Request  01/09/2023  Is this a "Controlled Substance" medicine? No  LOV: 12/03/2022  What is the name of the medication or equipment? ALPRAZolam (XANAX) 0.25 MG tablet   Have you contacted your pharmacy to request a refill? No   Which pharmacy would you like this sent to?   [facility] [id number], [place] [address] Phone: [phone] Fax: [phone]    Patient notified that their request is being sent to the clinical staff for review and that they should receive a response within 2 business days.   Please advise at [facility] [phone]

## 2023-01-09 NOTE — Telephone Encounter (Signed)
Spoke to pt. Advised her Dr Silvio Pate sent in a refill on 01-07-23.

## 2023-01-30 DIAGNOSIS — L949 Localized connective tissue disorder, unspecified: Secondary | ICD-10-CM | POA: Diagnosis not present

## 2023-01-30 DIAGNOSIS — Z452 Encounter for adjustment and management of vascular access device: Secondary | ICD-10-CM | POA: Diagnosis not present

## 2023-01-30 DIAGNOSIS — Z4682 Encounter for fitting and adjustment of non-vascular catheter: Secondary | ICD-10-CM | POA: Diagnosis not present

## 2023-01-30 DIAGNOSIS — Z79891 Long term (current) use of opiate analgesic: Secondary | ICD-10-CM | POA: Diagnosis not present

## 2023-01-30 DIAGNOSIS — R570 Cardiogenic shock: Secondary | ICD-10-CM | POA: Diagnosis present

## 2023-01-30 DIAGNOSIS — I270 Primary pulmonary hypertension: Secondary | ICD-10-CM | POA: Diagnosis not present

## 2023-01-30 DIAGNOSIS — E440 Moderate protein-calorie malnutrition: Secondary | ICD-10-CM | POA: Diagnosis not present

## 2023-01-30 DIAGNOSIS — E1122 Type 2 diabetes mellitus with diabetic chronic kidney disease: Secondary | ICD-10-CM | POA: Diagnosis present

## 2023-01-30 DIAGNOSIS — Z79899 Other long term (current) drug therapy: Secondary | ICD-10-CM | POA: Diagnosis not present

## 2023-01-30 DIAGNOSIS — I471 Supraventricular tachycardia, unspecified: Secondary | ICD-10-CM | POA: Diagnosis not present

## 2023-01-30 DIAGNOSIS — J9811 Atelectasis: Secondary | ICD-10-CM | POA: Diagnosis not present

## 2023-01-30 DIAGNOSIS — J45909 Unspecified asthma, uncomplicated: Secondary | ICD-10-CM | POA: Diagnosis not present

## 2023-01-30 DIAGNOSIS — Z515 Encounter for palliative care: Secondary | ICD-10-CM | POA: Diagnosis not present

## 2023-01-30 DIAGNOSIS — R0602 Shortness of breath: Secondary | ICD-10-CM | POA: Diagnosis not present

## 2023-01-30 DIAGNOSIS — E871 Hypo-osmolality and hyponatremia: Secondary | ICD-10-CM | POA: Diagnosis not present

## 2023-01-30 DIAGNOSIS — J810 Acute pulmonary edema: Secondary | ICD-10-CM | POA: Diagnosis not present

## 2023-01-30 DIAGNOSIS — D751 Secondary polycythemia: Secondary | ICD-10-CM | POA: Diagnosis not present

## 2023-01-30 DIAGNOSIS — I2510 Atherosclerotic heart disease of native coronary artery without angina pectoris: Secondary | ICD-10-CM | POA: Diagnosis not present

## 2023-01-30 DIAGNOSIS — M341 CR(E)ST syndrome: Secondary | ICD-10-CM | POA: Diagnosis not present

## 2023-01-30 DIAGNOSIS — J4540 Moderate persistent asthma, uncomplicated: Secondary | ICD-10-CM | POA: Diagnosis not present

## 2023-01-30 DIAGNOSIS — I5032 Chronic diastolic (congestive) heart failure: Secondary | ICD-10-CM | POA: Diagnosis not present

## 2023-01-30 DIAGNOSIS — J9621 Acute and chronic respiratory failure with hypoxia: Secondary | ICD-10-CM | POA: Diagnosis not present

## 2023-01-30 DIAGNOSIS — M359 Systemic involvement of connective tissue, unspecified: Secondary | ICD-10-CM | POA: Diagnosis not present

## 2023-01-30 DIAGNOSIS — I2722 Pulmonary hypertension due to left heart disease: Secondary | ICD-10-CM | POA: Diagnosis not present

## 2023-01-30 DIAGNOSIS — I50810 Right heart failure, unspecified: Secondary | ICD-10-CM | POA: Diagnosis not present

## 2023-01-30 DIAGNOSIS — G4733 Obstructive sleep apnea (adult) (pediatric): Secondary | ICD-10-CM | POA: Diagnosis not present

## 2023-01-30 DIAGNOSIS — R931 Abnormal findings on diagnostic imaging of heart and coronary circulation: Secondary | ICD-10-CM | POA: Diagnosis not present

## 2023-01-30 DIAGNOSIS — J984 Other disorders of lung: Secondary | ICD-10-CM | POA: Diagnosis not present

## 2023-01-30 DIAGNOSIS — I2721 Secondary pulmonary arterial hypertension: Secondary | ICD-10-CM | POA: Diagnosis not present

## 2023-01-30 DIAGNOSIS — E1159 Type 2 diabetes mellitus with other circulatory complications: Secondary | ICD-10-CM | POA: Diagnosis not present

## 2023-01-30 DIAGNOSIS — E890 Postprocedural hypothyroidism: Secondary | ICD-10-CM | POA: Diagnosis not present

## 2023-01-30 DIAGNOSIS — I2729 Other secondary pulmonary hypertension: Secondary | ICD-10-CM | POA: Diagnosis not present

## 2023-01-30 DIAGNOSIS — J189 Pneumonia, unspecified organism: Secondary | ICD-10-CM | POA: Diagnosis not present

## 2023-01-30 DIAGNOSIS — E11649 Type 2 diabetes mellitus with hypoglycemia without coma: Secondary | ICD-10-CM | POA: Diagnosis not present

## 2023-01-30 DIAGNOSIS — Z66 Do not resuscitate: Secondary | ICD-10-CM | POA: Diagnosis not present

## 2023-01-30 DIAGNOSIS — N189 Chronic kidney disease, unspecified: Secondary | ICD-10-CM | POA: Diagnosis present

## 2023-01-30 DIAGNOSIS — I517 Cardiomegaly: Secondary | ICD-10-CM | POA: Diagnosis not present

## 2023-01-30 DIAGNOSIS — R918 Other nonspecific abnormal finding of lung field: Secondary | ICD-10-CM | POA: Diagnosis not present

## 2023-01-30 DIAGNOSIS — I130 Hypertensive heart and chronic kidney disease with heart failure and stage 1 through stage 4 chronic kidney disease, or unspecified chronic kidney disease: Secondary | ICD-10-CM | POA: Diagnosis present

## 2023-01-30 DIAGNOSIS — I453 Trifascicular block: Secondary | ICD-10-CM | POA: Diagnosis not present

## 2023-01-30 DIAGNOSIS — J9601 Acute respiratory failure with hypoxia: Secondary | ICD-10-CM | POA: Diagnosis not present

## 2023-01-30 DIAGNOSIS — J811 Chronic pulmonary edema: Secondary | ICD-10-CM | POA: Diagnosis not present

## 2023-01-30 DIAGNOSIS — G2581 Restless legs syndrome: Secondary | ICD-10-CM | POA: Diagnosis not present

## 2023-01-30 DIAGNOSIS — M351 Other overlap syndromes: Secondary | ICD-10-CM | POA: Diagnosis not present

## 2023-01-30 DIAGNOSIS — I5023 Acute on chronic systolic (congestive) heart failure: Secondary | ICD-10-CM | POA: Diagnosis not present

## 2023-01-30 DIAGNOSIS — Z9981 Dependence on supplemental oxygen: Secondary | ICD-10-CM | POA: Diagnosis not present

## 2023-01-30 DIAGNOSIS — E039 Hypothyroidism, unspecified: Secondary | ICD-10-CM | POA: Diagnosis not present

## 2023-01-30 DIAGNOSIS — Z9989 Dependence on other enabling machines and devices: Secondary | ICD-10-CM | POA: Diagnosis not present

## 2023-01-30 DIAGNOSIS — J90 Pleural effusion, not elsewhere classified: Secondary | ICD-10-CM | POA: Diagnosis not present

## 2023-01-30 DIAGNOSIS — Z6831 Body mass index (BMI) 31.0-31.9, adult: Secondary | ICD-10-CM | POA: Diagnosis not present

## 2023-01-30 DIAGNOSIS — I5082 Biventricular heart failure: Secondary | ICD-10-CM | POA: Diagnosis not present

## 2023-01-30 DIAGNOSIS — M7989 Other specified soft tissue disorders: Secondary | ICD-10-CM | POA: Diagnosis not present

## 2023-01-30 DIAGNOSIS — I509 Heart failure, unspecified: Secondary | ICD-10-CM | POA: Diagnosis not present

## 2023-01-30 DIAGNOSIS — I272 Pulmonary hypertension, unspecified: Secondary | ICD-10-CM | POA: Diagnosis not present

## 2023-01-31 DIAGNOSIS — J90 Pleural effusion, not elsewhere classified: Secondary | ICD-10-CM | POA: Diagnosis not present

## 2023-01-31 DIAGNOSIS — I2722 Pulmonary hypertension due to left heart disease: Secondary | ICD-10-CM | POA: Diagnosis not present

## 2023-01-31 DIAGNOSIS — I272 Pulmonary hypertension, unspecified: Secondary | ICD-10-CM | POA: Diagnosis not present

## 2023-02-01 DIAGNOSIS — I272 Pulmonary hypertension, unspecified: Secondary | ICD-10-CM | POA: Diagnosis not present

## 2023-02-01 DIAGNOSIS — Z4682 Encounter for fitting and adjustment of non-vascular catheter: Secondary | ICD-10-CM | POA: Diagnosis not present

## 2023-02-01 DIAGNOSIS — I50810 Right heart failure, unspecified: Secondary | ICD-10-CM | POA: Diagnosis not present

## 2023-02-01 DIAGNOSIS — I2722 Pulmonary hypertension due to left heart disease: Secondary | ICD-10-CM | POA: Diagnosis not present

## 2023-02-01 DIAGNOSIS — I5023 Acute on chronic systolic (congestive) heart failure: Secondary | ICD-10-CM | POA: Diagnosis not present

## 2023-02-01 DIAGNOSIS — N189 Chronic kidney disease, unspecified: Secondary | ICD-10-CM | POA: Diagnosis not present

## 2023-02-01 DIAGNOSIS — R570 Cardiogenic shock: Secondary | ICD-10-CM | POA: Diagnosis not present

## 2023-02-01 DIAGNOSIS — I130 Hypertensive heart and chronic kidney disease with heart failure and stage 1 through stage 4 chronic kidney disease, or unspecified chronic kidney disease: Secondary | ICD-10-CM | POA: Diagnosis not present

## 2023-02-01 DIAGNOSIS — E1122 Type 2 diabetes mellitus with diabetic chronic kidney disease: Secondary | ICD-10-CM | POA: Diagnosis not present

## 2023-02-01 DIAGNOSIS — R918 Other nonspecific abnormal finding of lung field: Secondary | ICD-10-CM | POA: Diagnosis not present

## 2023-02-01 DIAGNOSIS — Z79891 Long term (current) use of opiate analgesic: Secondary | ICD-10-CM | POA: Diagnosis not present

## 2023-02-01 DIAGNOSIS — I517 Cardiomegaly: Secondary | ICD-10-CM | POA: Diagnosis not present

## 2023-02-01 DIAGNOSIS — I2510 Atherosclerotic heart disease of native coronary artery without angina pectoris: Secondary | ICD-10-CM | POA: Diagnosis not present

## 2023-02-01 DIAGNOSIS — E039 Hypothyroidism, unspecified: Secondary | ICD-10-CM | POA: Diagnosis not present

## 2023-02-01 DIAGNOSIS — Z66 Do not resuscitate: Secondary | ICD-10-CM | POA: Diagnosis not present

## 2023-02-01 DIAGNOSIS — Z452 Encounter for adjustment and management of vascular access device: Secondary | ICD-10-CM | POA: Diagnosis not present

## 2023-02-02 DIAGNOSIS — E039 Hypothyroidism, unspecified: Secondary | ICD-10-CM | POA: Diagnosis not present

## 2023-02-02 DIAGNOSIS — Z79891 Long term (current) use of opiate analgesic: Secondary | ICD-10-CM | POA: Diagnosis not present

## 2023-02-02 DIAGNOSIS — I272 Pulmonary hypertension, unspecified: Secondary | ICD-10-CM | POA: Diagnosis not present

## 2023-02-02 DIAGNOSIS — R570 Cardiogenic shock: Secondary | ICD-10-CM | POA: Diagnosis not present

## 2023-02-02 DIAGNOSIS — I5023 Acute on chronic systolic (congestive) heart failure: Secondary | ICD-10-CM | POA: Diagnosis not present

## 2023-02-02 DIAGNOSIS — Z66 Do not resuscitate: Secondary | ICD-10-CM | POA: Diagnosis not present

## 2023-02-02 DIAGNOSIS — E1122 Type 2 diabetes mellitus with diabetic chronic kidney disease: Secondary | ICD-10-CM | POA: Diagnosis not present

## 2023-02-02 DIAGNOSIS — J90 Pleural effusion, not elsewhere classified: Secondary | ICD-10-CM | POA: Diagnosis not present

## 2023-02-02 DIAGNOSIS — N189 Chronic kidney disease, unspecified: Secondary | ICD-10-CM | POA: Diagnosis not present

## 2023-02-02 DIAGNOSIS — I2722 Pulmonary hypertension due to left heart disease: Secondary | ICD-10-CM | POA: Diagnosis not present

## 2023-02-02 DIAGNOSIS — I2510 Atherosclerotic heart disease of native coronary artery without angina pectoris: Secondary | ICD-10-CM | POA: Diagnosis not present

## 2023-02-02 DIAGNOSIS — I130 Hypertensive heart and chronic kidney disease with heart failure and stage 1 through stage 4 chronic kidney disease, or unspecified chronic kidney disease: Secondary | ICD-10-CM | POA: Diagnosis not present

## 2023-02-02 DIAGNOSIS — I50810 Right heart failure, unspecified: Secondary | ICD-10-CM | POA: Diagnosis not present

## 2023-02-03 DIAGNOSIS — I50810 Right heart failure, unspecified: Secondary | ICD-10-CM | POA: Diagnosis not present

## 2023-02-03 DIAGNOSIS — I130 Hypertensive heart and chronic kidney disease with heart failure and stage 1 through stage 4 chronic kidney disease, or unspecified chronic kidney disease: Secondary | ICD-10-CM | POA: Diagnosis not present

## 2023-02-03 DIAGNOSIS — Z79891 Long term (current) use of opiate analgesic: Secondary | ICD-10-CM | POA: Diagnosis not present

## 2023-02-03 DIAGNOSIS — Z66 Do not resuscitate: Secondary | ICD-10-CM | POA: Diagnosis not present

## 2023-02-03 DIAGNOSIS — I2510 Atherosclerotic heart disease of native coronary artery without angina pectoris: Secondary | ICD-10-CM | POA: Diagnosis not present

## 2023-02-03 DIAGNOSIS — E039 Hypothyroidism, unspecified: Secondary | ICD-10-CM | POA: Diagnosis not present

## 2023-02-03 DIAGNOSIS — I5023 Acute on chronic systolic (congestive) heart failure: Secondary | ICD-10-CM | POA: Diagnosis not present

## 2023-02-03 DIAGNOSIS — N189 Chronic kidney disease, unspecified: Secondary | ICD-10-CM | POA: Diagnosis not present

## 2023-02-03 DIAGNOSIS — E1122 Type 2 diabetes mellitus with diabetic chronic kidney disease: Secondary | ICD-10-CM | POA: Diagnosis not present

## 2023-02-03 DIAGNOSIS — R570 Cardiogenic shock: Secondary | ICD-10-CM | POA: Diagnosis not present

## 2023-02-03 DIAGNOSIS — I272 Pulmonary hypertension, unspecified: Secondary | ICD-10-CM | POA: Diagnosis not present

## 2023-02-04 DIAGNOSIS — I2722 Pulmonary hypertension due to left heart disease: Secondary | ICD-10-CM | POA: Diagnosis not present

## 2023-02-04 DIAGNOSIS — J90 Pleural effusion, not elsewhere classified: Secondary | ICD-10-CM | POA: Diagnosis not present

## 2023-02-04 DIAGNOSIS — I471 Supraventricular tachycardia, unspecified: Secondary | ICD-10-CM | POA: Diagnosis not present

## 2023-02-04 DIAGNOSIS — N189 Chronic kidney disease, unspecified: Secondary | ICD-10-CM | POA: Diagnosis not present

## 2023-02-04 DIAGNOSIS — G2581 Restless legs syndrome: Secondary | ICD-10-CM | POA: Diagnosis not present

## 2023-02-04 DIAGNOSIS — I270 Primary pulmonary hypertension: Secondary | ICD-10-CM | POA: Diagnosis not present

## 2023-02-04 DIAGNOSIS — I272 Pulmonary hypertension, unspecified: Secondary | ICD-10-CM | POA: Diagnosis not present

## 2023-02-04 DIAGNOSIS — D751 Secondary polycythemia: Secondary | ICD-10-CM | POA: Diagnosis not present

## 2023-02-04 DIAGNOSIS — I50810 Right heart failure, unspecified: Secondary | ICD-10-CM | POA: Diagnosis not present

## 2023-02-04 DIAGNOSIS — J9811 Atelectasis: Secondary | ICD-10-CM | POA: Diagnosis not present

## 2023-02-04 DIAGNOSIS — E1159 Type 2 diabetes mellitus with other circulatory complications: Secondary | ICD-10-CM | POA: Diagnosis not present

## 2023-02-04 DIAGNOSIS — R570 Cardiogenic shock: Secondary | ICD-10-CM | POA: Diagnosis not present

## 2023-02-04 DIAGNOSIS — E1122 Type 2 diabetes mellitus with diabetic chronic kidney disease: Secondary | ICD-10-CM | POA: Diagnosis not present

## 2023-02-04 DIAGNOSIS — J984 Other disorders of lung: Secondary | ICD-10-CM | POA: Diagnosis not present

## 2023-02-04 DIAGNOSIS — E039 Hypothyroidism, unspecified: Secondary | ICD-10-CM | POA: Diagnosis not present

## 2023-02-04 DIAGNOSIS — I2510 Atherosclerotic heart disease of native coronary artery without angina pectoris: Secondary | ICD-10-CM | POA: Diagnosis not present

## 2023-02-05 DIAGNOSIS — I270 Primary pulmonary hypertension: Secondary | ICD-10-CM | POA: Diagnosis not present

## 2023-02-05 DIAGNOSIS — I50810 Right heart failure, unspecified: Secondary | ICD-10-CM | POA: Diagnosis not present

## 2023-02-05 DIAGNOSIS — J984 Other disorders of lung: Secondary | ICD-10-CM | POA: Diagnosis not present

## 2023-02-05 DIAGNOSIS — G2581 Restless legs syndrome: Secondary | ICD-10-CM | POA: Diagnosis not present

## 2023-02-05 DIAGNOSIS — I471 Supraventricular tachycardia, unspecified: Secondary | ICD-10-CM | POA: Diagnosis not present

## 2023-02-05 DIAGNOSIS — R570 Cardiogenic shock: Secondary | ICD-10-CM | POA: Diagnosis not present

## 2023-02-05 DIAGNOSIS — E1159 Type 2 diabetes mellitus with other circulatory complications: Secondary | ICD-10-CM | POA: Diagnosis not present

## 2023-02-05 DIAGNOSIS — D751 Secondary polycythemia: Secondary | ICD-10-CM | POA: Diagnosis not present

## 2023-02-05 DIAGNOSIS — I517 Cardiomegaly: Secondary | ICD-10-CM | POA: Diagnosis not present

## 2023-02-05 DIAGNOSIS — I2722 Pulmonary hypertension due to left heart disease: Secondary | ICD-10-CM | POA: Diagnosis not present

## 2023-02-05 DIAGNOSIS — R918 Other nonspecific abnormal finding of lung field: Secondary | ICD-10-CM | POA: Diagnosis not present

## 2023-02-05 DIAGNOSIS — E039 Hypothyroidism, unspecified: Secondary | ICD-10-CM | POA: Diagnosis not present

## 2023-02-05 DIAGNOSIS — E1122 Type 2 diabetes mellitus with diabetic chronic kidney disease: Secondary | ICD-10-CM | POA: Diagnosis not present

## 2023-02-05 DIAGNOSIS — J90 Pleural effusion, not elsewhere classified: Secondary | ICD-10-CM | POA: Diagnosis not present

## 2023-02-05 DIAGNOSIS — I2510 Atherosclerotic heart disease of native coronary artery without angina pectoris: Secondary | ICD-10-CM | POA: Diagnosis not present

## 2023-02-05 DIAGNOSIS — N189 Chronic kidney disease, unspecified: Secondary | ICD-10-CM | POA: Diagnosis not present

## 2023-02-06 ENCOUNTER — Telehealth: Payer: Self-pay | Admitting: Internal Medicine

## 2023-02-06 DIAGNOSIS — R918 Other nonspecific abnormal finding of lung field: Secondary | ICD-10-CM | POA: Diagnosis not present

## 2023-02-06 DIAGNOSIS — M359 Systemic involvement of connective tissue, unspecified: Secondary | ICD-10-CM | POA: Diagnosis not present

## 2023-02-06 DIAGNOSIS — E1122 Type 2 diabetes mellitus with diabetic chronic kidney disease: Secondary | ICD-10-CM | POA: Diagnosis not present

## 2023-02-06 DIAGNOSIS — J45909 Unspecified asthma, uncomplicated: Secondary | ICD-10-CM | POA: Diagnosis not present

## 2023-02-06 DIAGNOSIS — E039 Hypothyroidism, unspecified: Secondary | ICD-10-CM | POA: Diagnosis not present

## 2023-02-06 DIAGNOSIS — J9621 Acute and chronic respiratory failure with hypoxia: Secondary | ICD-10-CM | POA: Diagnosis not present

## 2023-02-06 DIAGNOSIS — I517 Cardiomegaly: Secondary | ICD-10-CM | POA: Diagnosis not present

## 2023-02-06 DIAGNOSIS — R570 Cardiogenic shock: Secondary | ICD-10-CM | POA: Diagnosis not present

## 2023-02-06 DIAGNOSIS — I2510 Atherosclerotic heart disease of native coronary artery without angina pectoris: Secondary | ICD-10-CM | POA: Diagnosis not present

## 2023-02-06 DIAGNOSIS — Z79899 Other long term (current) drug therapy: Secondary | ICD-10-CM | POA: Diagnosis not present

## 2023-02-06 DIAGNOSIS — I2729 Other secondary pulmonary hypertension: Secondary | ICD-10-CM | POA: Diagnosis not present

## 2023-02-06 DIAGNOSIS — I2722 Pulmonary hypertension due to left heart disease: Secondary | ICD-10-CM | POA: Diagnosis not present

## 2023-02-06 DIAGNOSIS — I471 Supraventricular tachycardia, unspecified: Secondary | ICD-10-CM | POA: Diagnosis not present

## 2023-02-06 DIAGNOSIS — I50810 Right heart failure, unspecified: Secondary | ICD-10-CM | POA: Diagnosis not present

## 2023-02-06 DIAGNOSIS — N189 Chronic kidney disease, unspecified: Secondary | ICD-10-CM | POA: Diagnosis not present

## 2023-02-06 DIAGNOSIS — I2721 Secondary pulmonary arterial hypertension: Secondary | ICD-10-CM | POA: Diagnosis not present

## 2023-02-06 NOTE — Telephone Encounter (Signed)
Patient called in just to let Dr Silvio Pate know that she is currently in [place]. She has pulmonary hypertension that she is being treated for.

## 2023-02-07 DIAGNOSIS — I517 Cardiomegaly: Secondary | ICD-10-CM | POA: Diagnosis not present

## 2023-02-07 DIAGNOSIS — I2722 Pulmonary hypertension due to left heart disease: Secondary | ICD-10-CM | POA: Diagnosis not present

## 2023-02-07 DIAGNOSIS — R918 Other nonspecific abnormal finding of lung field: Secondary | ICD-10-CM | POA: Diagnosis not present

## 2023-02-08 DIAGNOSIS — I2721 Secondary pulmonary arterial hypertension: Secondary | ICD-10-CM | POA: Diagnosis not present

## 2023-02-08 DIAGNOSIS — I2722 Pulmonary hypertension due to left heart disease: Secondary | ICD-10-CM | POA: Diagnosis not present

## 2023-02-08 DIAGNOSIS — R570 Cardiogenic shock: Secondary | ICD-10-CM | POA: Diagnosis not present

## 2023-02-09 DIAGNOSIS — I2722 Pulmonary hypertension due to left heart disease: Secondary | ICD-10-CM | POA: Diagnosis not present

## 2023-02-09 NOTE — Telephone Encounter (Signed)
Spoke to husband She went in 10 days ago to have heart cath--and almost died apparently In ICU since then, on parenteral inotrope and other medicine for pulm HTN Expects her to be there at least another week---he will keep [place] up to date

## 2023-02-10 DIAGNOSIS — R570 Cardiogenic shock: Secondary | ICD-10-CM | POA: Diagnosis not present

## 2023-02-10 DIAGNOSIS — I2722 Pulmonary hypertension due to left heart disease: Secondary | ICD-10-CM | POA: Diagnosis not present

## 2023-02-10 DIAGNOSIS — I50810 Right heart failure, unspecified: Secondary | ICD-10-CM | POA: Diagnosis not present

## 2023-02-10 DIAGNOSIS — I2729 Other secondary pulmonary hypertension: Secondary | ICD-10-CM | POA: Diagnosis not present

## 2023-02-11 DIAGNOSIS — I517 Cardiomegaly: Secondary | ICD-10-CM | POA: Diagnosis not present

## 2023-02-11 DIAGNOSIS — I50810 Right heart failure, unspecified: Secondary | ICD-10-CM | POA: Diagnosis not present

## 2023-02-11 DIAGNOSIS — I2722 Pulmonary hypertension due to left heart disease: Secondary | ICD-10-CM | POA: Diagnosis not present

## 2023-02-11 DIAGNOSIS — Z452 Encounter for adjustment and management of vascular access device: Secondary | ICD-10-CM | POA: Diagnosis not present

## 2023-02-11 DIAGNOSIS — I2729 Other secondary pulmonary hypertension: Secondary | ICD-10-CM | POA: Diagnosis not present

## 2023-02-12 DIAGNOSIS — I2722 Pulmonary hypertension due to left heart disease: Secondary | ICD-10-CM | POA: Diagnosis not present

## 2023-02-13 DIAGNOSIS — I2722 Pulmonary hypertension due to left heart disease: Secondary | ICD-10-CM | POA: Diagnosis not present

## 2023-02-14 DIAGNOSIS — I2729 Other secondary pulmonary hypertension: Secondary | ICD-10-CM | POA: Diagnosis not present

## 2023-02-14 DIAGNOSIS — R918 Other nonspecific abnormal finding of lung field: Secondary | ICD-10-CM | POA: Diagnosis not present

## 2023-02-14 DIAGNOSIS — R931 Abnormal findings on diagnostic imaging of heart and coronary circulation: Secondary | ICD-10-CM | POA: Diagnosis not present

## 2023-02-14 DIAGNOSIS — I50810 Right heart failure, unspecified: Secondary | ICD-10-CM | POA: Diagnosis not present

## 2023-02-14 DIAGNOSIS — R570 Cardiogenic shock: Secondary | ICD-10-CM | POA: Diagnosis not present

## 2023-02-14 DIAGNOSIS — J90 Pleural effusion, not elsewhere classified: Secondary | ICD-10-CM | POA: Diagnosis not present

## 2023-02-14 DIAGNOSIS — R0602 Shortness of breath: Secondary | ICD-10-CM | POA: Diagnosis not present

## 2023-02-14 DIAGNOSIS — I2722 Pulmonary hypertension due to left heart disease: Secondary | ICD-10-CM | POA: Diagnosis not present

## 2023-02-15 DIAGNOSIS — R570 Cardiogenic shock: Secondary | ICD-10-CM | POA: Diagnosis not present

## 2023-02-15 DIAGNOSIS — R0602 Shortness of breath: Secondary | ICD-10-CM | POA: Diagnosis not present

## 2023-02-15 DIAGNOSIS — I50810 Right heart failure, unspecified: Secondary | ICD-10-CM | POA: Diagnosis not present

## 2023-02-15 DIAGNOSIS — I2722 Pulmonary hypertension due to left heart disease: Secondary | ICD-10-CM | POA: Diagnosis not present

## 2023-02-15 DIAGNOSIS — I2729 Other secondary pulmonary hypertension: Secondary | ICD-10-CM | POA: Diagnosis not present

## 2023-02-16 DIAGNOSIS — R0602 Shortness of breath: Secondary | ICD-10-CM | POA: Diagnosis not present

## 2023-02-16 DIAGNOSIS — I50810 Right heart failure, unspecified: Secondary | ICD-10-CM | POA: Diagnosis not present

## 2023-02-16 DIAGNOSIS — Z452 Encounter for adjustment and management of vascular access device: Secondary | ICD-10-CM | POA: Diagnosis not present

## 2023-02-16 DIAGNOSIS — R570 Cardiogenic shock: Secondary | ICD-10-CM | POA: Diagnosis not present

## 2023-02-16 DIAGNOSIS — M7989 Other specified soft tissue disorders: Secondary | ICD-10-CM | POA: Diagnosis not present

## 2023-02-16 DIAGNOSIS — I2729 Other secondary pulmonary hypertension: Secondary | ICD-10-CM | POA: Diagnosis not present

## 2023-02-16 DIAGNOSIS — I2722 Pulmonary hypertension due to left heart disease: Secondary | ICD-10-CM | POA: Diagnosis not present

## 2023-02-17 DIAGNOSIS — R570 Cardiogenic shock: Secondary | ICD-10-CM | POA: Diagnosis not present

## 2023-02-17 DIAGNOSIS — I2722 Pulmonary hypertension due to left heart disease: Secondary | ICD-10-CM | POA: Diagnosis not present

## 2023-02-17 DIAGNOSIS — I272 Pulmonary hypertension, unspecified: Secondary | ICD-10-CM | POA: Diagnosis not present

## 2023-02-17 DIAGNOSIS — I50810 Right heart failure, unspecified: Secondary | ICD-10-CM | POA: Diagnosis not present

## 2023-02-17 DIAGNOSIS — J90 Pleural effusion, not elsewhere classified: Secondary | ICD-10-CM | POA: Diagnosis not present

## 2023-02-17 DIAGNOSIS — I2729 Other secondary pulmonary hypertension: Secondary | ICD-10-CM | POA: Diagnosis not present

## 2023-02-18 DIAGNOSIS — I2729 Other secondary pulmonary hypertension: Secondary | ICD-10-CM | POA: Diagnosis not present

## 2023-02-18 DIAGNOSIS — I509 Heart failure, unspecified: Secondary | ICD-10-CM | POA: Diagnosis not present

## 2023-02-18 DIAGNOSIS — J90 Pleural effusion, not elsewhere classified: Secondary | ICD-10-CM | POA: Diagnosis not present

## 2023-02-18 DIAGNOSIS — M341 CR(E)ST syndrome: Secondary | ICD-10-CM | POA: Diagnosis not present

## 2023-02-18 DIAGNOSIS — J9601 Acute respiratory failure with hypoxia: Secondary | ICD-10-CM | POA: Diagnosis not present

## 2023-02-18 DIAGNOSIS — J9811 Atelectasis: Secondary | ICD-10-CM | POA: Diagnosis not present

## 2023-02-18 DIAGNOSIS — R570 Cardiogenic shock: Secondary | ICD-10-CM | POA: Diagnosis not present

## 2023-02-18 DIAGNOSIS — G4733 Obstructive sleep apnea (adult) (pediatric): Secondary | ICD-10-CM | POA: Diagnosis not present

## 2023-02-18 DIAGNOSIS — J811 Chronic pulmonary edema: Secondary | ICD-10-CM | POA: Diagnosis not present

## 2023-02-18 DIAGNOSIS — I270 Primary pulmonary hypertension: Secondary | ICD-10-CM | POA: Diagnosis not present

## 2023-02-18 DIAGNOSIS — I2722 Pulmonary hypertension due to left heart disease: Secondary | ICD-10-CM | POA: Diagnosis not present

## 2023-02-18 DIAGNOSIS — I5032 Chronic diastolic (congestive) heart failure: Secondary | ICD-10-CM | POA: Diagnosis not present

## 2023-02-18 DIAGNOSIS — I50810 Right heart failure, unspecified: Secondary | ICD-10-CM | POA: Diagnosis not present

## 2023-02-19 DIAGNOSIS — I50810 Right heart failure, unspecified: Secondary | ICD-10-CM | POA: Diagnosis not present

## 2023-02-19 DIAGNOSIS — R570 Cardiogenic shock: Secondary | ICD-10-CM | POA: Diagnosis not present

## 2023-02-19 DIAGNOSIS — I2729 Other secondary pulmonary hypertension: Secondary | ICD-10-CM | POA: Diagnosis not present

## 2023-02-19 DIAGNOSIS — I2722 Pulmonary hypertension due to left heart disease: Secondary | ICD-10-CM | POA: Diagnosis not present

## 2023-02-20 DIAGNOSIS — I2722 Pulmonary hypertension due to left heart disease: Secondary | ICD-10-CM | POA: Diagnosis not present

## 2023-02-20 DIAGNOSIS — R570 Cardiogenic shock: Secondary | ICD-10-CM | POA: Diagnosis not present

## 2023-02-20 DIAGNOSIS — I50810 Right heart failure, unspecified: Secondary | ICD-10-CM | POA: Diagnosis not present

## 2023-02-20 DIAGNOSIS — I2729 Other secondary pulmonary hypertension: Secondary | ICD-10-CM | POA: Diagnosis not present

## 2023-02-21 DIAGNOSIS — J90 Pleural effusion, not elsewhere classified: Secondary | ICD-10-CM | POA: Diagnosis not present

## 2023-02-21 DIAGNOSIS — I2722 Pulmonary hypertension due to left heart disease: Secondary | ICD-10-CM | POA: Diagnosis not present

## 2023-02-21 DIAGNOSIS — I272 Pulmonary hypertension, unspecified: Secondary | ICD-10-CM | POA: Diagnosis not present

## 2023-03-23 DEATH — deceased
